# Patient Record
Sex: Male | Born: 1950 | Race: Black or African American | Hispanic: No | Marital: Married | Smoking: Former smoker
Health system: Southern US, Community
[De-identification: ages and names within clinical notes are randomized; demographics above are authoritative.]

## PROBLEM LIST (undated history)

## (undated) DIAGNOSIS — Z923 Personal history of irradiation: Secondary | ICD-10-CM

## (undated) DIAGNOSIS — I10 Essential (primary) hypertension: Secondary | ICD-10-CM

## (undated) DIAGNOSIS — Z5189 Encounter for other specified aftercare: Secondary | ICD-10-CM

## (undated) DIAGNOSIS — C61 Malignant neoplasm of prostate: Secondary | ICD-10-CM

## (undated) DIAGNOSIS — T7840XA Allergy, unspecified, initial encounter: Secondary | ICD-10-CM

## (undated) DIAGNOSIS — C419 Malignant neoplasm of bone and articular cartilage, unspecified: Secondary | ICD-10-CM

## (undated) DIAGNOSIS — I2510 Atherosclerotic heart disease of native coronary artery without angina pectoris: Secondary | ICD-10-CM

## (undated) DIAGNOSIS — D649 Anemia, unspecified: Secondary | ICD-10-CM

## (undated) HISTORY — PX: PORTACATH PLACEMENT: SHX2246

## (undated) HISTORY — DX: Malignant neoplasm of bone and articular cartilage, unspecified: C41.9

## (undated) SURGERY — LUMBAR LAMINECTOMY FOR TUMOR
Anesthesia: General | Laterality: Bilateral

---

## 2001-03-01 ENCOUNTER — Encounter (INDEPENDENT_AMBULATORY_CARE_PROVIDER_SITE_OTHER): Payer: Self-pay | Admitting: Specialist

## 2001-03-01 ENCOUNTER — Other Ambulatory Visit: Admission: RE | Admit: 2001-03-01 | Discharge: 2001-03-01 | Payer: Self-pay | Admitting: Internal Medicine

## 2001-03-01 HISTORY — PX: COLONOSCOPY W/ POLYPECTOMY: SHX1380

## 2004-05-13 ENCOUNTER — Encounter: Payer: Self-pay | Admitting: Internal Medicine

## 2004-07-02 ENCOUNTER — Ambulatory Visit: Payer: Self-pay | Admitting: Family Medicine

## 2004-07-02 ENCOUNTER — Encounter: Admission: RE | Admit: 2004-07-02 | Discharge: 2004-07-02 | Payer: Self-pay | Admitting: Family Medicine

## 2004-07-14 ENCOUNTER — Ambulatory Visit: Payer: Self-pay | Admitting: Family Medicine

## 2005-11-18 ENCOUNTER — Ambulatory Visit: Payer: Self-pay | Admitting: Family Medicine

## 2005-12-10 ENCOUNTER — Ambulatory Visit: Payer: Self-pay | Admitting: Family Medicine

## 2006-01-06 ENCOUNTER — Ambulatory Visit (HOSPITAL_COMMUNITY): Admission: RE | Admit: 2006-01-06 | Discharge: 2006-01-06 | Payer: Self-pay | Admitting: Urology

## 2006-01-08 DIAGNOSIS — C61 Malignant neoplasm of prostate: Secondary | ICD-10-CM

## 2006-01-08 HISTORY — DX: Malignant neoplasm of prostate: C61

## 2006-01-15 ENCOUNTER — Encounter (HOSPITAL_COMMUNITY): Admission: RE | Admit: 2006-01-15 | Discharge: 2006-04-15 | Payer: Self-pay | Admitting: Urology

## 2006-03-03 ENCOUNTER — Encounter (INDEPENDENT_AMBULATORY_CARE_PROVIDER_SITE_OTHER): Payer: Self-pay | Admitting: Specialist

## 2006-03-03 ENCOUNTER — Inpatient Hospital Stay (HOSPITAL_COMMUNITY): Admission: RE | Admit: 2006-03-03 | Discharge: 2006-03-04 | Payer: Self-pay | Admitting: Urology

## 2006-03-03 HISTORY — PX: PROSTATECTOMY: SHX69

## 2006-04-09 ENCOUNTER — Ambulatory Visit: Admission: RE | Admit: 2006-04-09 | Discharge: 2006-07-08 | Payer: Self-pay | Admitting: Radiation Oncology

## 2006-04-21 ENCOUNTER — Ambulatory Visit: Payer: Self-pay | Admitting: Oncology

## 2006-04-30 LAB — COMPREHENSIVE METABOLIC PANEL
ALT: 21 U/L (ref 0–40)
AST: 15 U/L (ref 0–37)
Chloride: 106 mEq/L (ref 96–112)
Creatinine, Ser: 0.96 mg/dL (ref 0.40–1.50)
Total Bilirubin: 0.4 mg/dL (ref 0.3–1.2)

## 2006-04-30 LAB — CBC WITH DIFFERENTIAL/PLATELET
Eosinophils Absolute: 0.1 10*3/uL (ref 0.0–0.5)
HGB: 10.3 g/dL — ABNORMAL LOW (ref 13.0–17.1)
MONO#: 0.4 10*3/uL (ref 0.1–0.9)
NEUT#: 1.9 10*3/uL (ref 1.5–6.5)
RBC: 4.26 10*6/uL (ref 4.20–5.71)
RDW: 15.8 % — ABNORMAL HIGH (ref 11.2–14.6)
WBC: 3.8 10*3/uL — ABNORMAL LOW (ref 4.0–10.0)
lymph#: 1.4 10*3/uL (ref 0.9–3.3)

## 2007-04-05 ENCOUNTER — Ambulatory Visit: Payer: Self-pay | Admitting: Family Medicine

## 2007-04-05 LAB — CONVERTED CEMR LAB
ALT: 72 units/L — ABNORMAL HIGH (ref 0–53)
AST: 35 units/L (ref 0–37)
Albumin: 3.8 g/dL (ref 3.5–5.2)
Alkaline Phosphatase: 61 units/L (ref 39–117)
BUN: 18 mg/dL (ref 6–23)
Basophils Absolute: 0 10*3/uL (ref 0.0–0.1)
Basophils Relative: 1 % (ref 0.0–1.0)
Bilirubin Urine: NEGATIVE
Bilirubin, Direct: 0.1 mg/dL (ref 0.0–0.3)
Blood in Urine, dipstick: NEGATIVE
CO2: 28 meq/L (ref 19–32)
Calcium: 9.9 mg/dL (ref 8.4–10.5)
Chloride: 105 meq/L (ref 96–112)
Cholesterol: 174 mg/dL (ref 0–200)
Creatinine, Ser: 0.9 mg/dL (ref 0.4–1.5)
Eosinophils Absolute: 0.2 10*3/uL (ref 0.0–0.6)
Eosinophils Relative: 3.8 % (ref 0.0–5.0)
GFR calc Af Amer: 112 mL/min
GFR calc non Af Amer: 92 mL/min
Glucose, Bld: 136 mg/dL — ABNORMAL HIGH (ref 70–99)
Glucose, Urine, Semiquant: NEGATIVE
HCT: 32 % — ABNORMAL LOW (ref 39.0–52.0)
HDL: 34.5 mg/dL — ABNORMAL LOW (ref >39.0)
Hemoglobin: 10.5 g/dL — ABNORMAL LOW (ref 13.0–17.0)
Ketones, urine, test strip: NEGATIVE
LDL Cholesterol: 119 mg/dL — ABNORMAL HIGH (ref 0–99)
Lymphocytes Relative: 32.1 % (ref 12.0–46.0)
MCHC: 32.8 g/dL (ref 30.0–36.0)
MCV: 75.4 fL — ABNORMAL LOW (ref 78.0–100.0)
Monocytes Absolute: 0.3 10*3/uL (ref 0.2–0.7)
Monocytes Relative: 7.6 % (ref 3.0–11.0)
Neutro Abs: 2.2 10*3/uL (ref 1.4–7.7)
Neutrophils Relative %: 55.5 % (ref 43.0–77.0)
Nitrite: NEGATIVE
PSA: 0.04 ng/mL — ABNORMAL LOW (ref 0.10–4.00)
Platelets: 221 10*3/uL (ref 150–400)
Potassium: 3.8 meq/L (ref 3.5–5.1)
Protein, U semiquant: NEGATIVE
RBC: 4.24 M/uL (ref 4.22–5.81)
RDW: 14.4 % (ref 11.5–14.6)
Sodium: 140 meq/L (ref 135–145)
Specific Gravity, Urine: 1.02
TSH: 1.36 microintl units/mL (ref 0.35–5.50)
Total Bilirubin: 0.7 mg/dL (ref 0.3–1.2)
Total CHOL/HDL Ratio: 5
Total Protein: 7 g/dL (ref 6.0–8.3)
Triglycerides: 101 mg/dL (ref 0–149)
Urobilinogen, UA: 0.2
VLDL: 20 mg/dL (ref 0–40)
WBC Urine, dipstick: NEGATIVE
WBC: 4 10*3/uL — ABNORMAL LOW (ref 4.5–10.5)
pH: 5

## 2007-04-06 ENCOUNTER — Telehealth: Payer: Self-pay | Admitting: Family Medicine

## 2007-04-12 ENCOUNTER — Ambulatory Visit: Payer: Self-pay | Admitting: Family Medicine

## 2007-04-12 DIAGNOSIS — F17200 Nicotine dependence, unspecified, uncomplicated: Secondary | ICD-10-CM

## 2007-04-12 DIAGNOSIS — I10 Essential (primary) hypertension: Secondary | ICD-10-CM | POA: Insufficient documentation

## 2007-04-12 DIAGNOSIS — D649 Anemia, unspecified: Secondary | ICD-10-CM

## 2007-04-12 DIAGNOSIS — Z8546 Personal history of malignant neoplasm of prostate: Secondary | ICD-10-CM

## 2007-05-24 ENCOUNTER — Encounter: Payer: Self-pay | Admitting: Family Medicine

## 2007-10-11 ENCOUNTER — Ambulatory Visit: Payer: Self-pay | Admitting: Family Medicine

## 2007-10-11 DIAGNOSIS — E119 Type 2 diabetes mellitus without complications: Secondary | ICD-10-CM

## 2007-10-11 LAB — CONVERTED CEMR LAB
ALT: 48 units/L (ref 0–53)
AST: 31 units/L (ref 0–37)
Albumin: 4.5 g/dL (ref 3.5–5.2)
Alkaline Phosphatase: 97 units/L (ref 39–117)
BUN: 15 mg/dL (ref 6–23)
Basophils Absolute: 0.1 10*3/uL (ref 0.0–0.1)
Basophils Relative: 1.2 % — ABNORMAL HIGH (ref 0.0–1.0)
Bilirubin, Direct: 0.2 mg/dL (ref 0.0–0.3)
CO2: 28 meq/L (ref 19–32)
Calcium: 10.7 mg/dL — ABNORMAL HIGH (ref 8.4–10.5)
Chloride: 100 meq/L (ref 96–112)
Creatinine, Ser: 1 mg/dL (ref 0.4–1.5)
Creatinine,U: 82.9 mg/dL
Eosinophils Absolute: 0.1 10*3/uL (ref 0.0–0.6)
Eosinophils Relative: 2.4 % (ref 0.0–5.0)
GFR calc Af Amer: 99 mL/min
GFR calc non Af Amer: 82 mL/min
Glucose, Bld: 361 mg/dL — ABNORMAL HIGH (ref 70–99)
HCT: 42.2 % (ref 39.0–52.0)
Hemoglobin: 13.2 g/dL (ref 13.0–17.0)
Hgb A1c MFr Bld: 13.2 % — ABNORMAL HIGH (ref 4.6–6.0)
Lymphocytes Relative: 30.4 % (ref 12.0–46.0)
MCHC: 31.4 g/dL (ref 30.0–36.0)
MCV: 75.3 fL — ABNORMAL LOW (ref 78.0–100.0)
Microalb Creat Ratio: 12.1 mg/g (ref 0.0–30.0)
Microalb, Ur: 1 mg/dL (ref 0.0–1.9)
Monocytes Absolute: 0.3 10*3/uL (ref 0.2–0.7)
Monocytes Relative: 6 % (ref 3.0–11.0)
Neutro Abs: 2.9 10*3/uL (ref 1.4–7.7)
Neutrophils Relative %: 60 % (ref 43.0–77.0)
Platelets: 201 10*3/uL (ref 150–400)
Potassium: 5.1 meq/L (ref 3.5–5.1)
RBC: 5.61 M/uL (ref 4.22–5.81)
RDW: 13.1 % (ref 11.5–14.6)
Sodium: 138 meq/L (ref 135–145)
Total Bilirubin: 1.1 mg/dL (ref 0.3–1.2)
Total Protein: 7.3 g/dL (ref 6.0–8.3)
WBC: 4.9 10*3/uL (ref 4.5–10.5)

## 2007-10-14 ENCOUNTER — Ambulatory Visit: Payer: Self-pay | Admitting: Family Medicine

## 2007-10-14 DIAGNOSIS — E109 Type 1 diabetes mellitus without complications: Secondary | ICD-10-CM | POA: Insufficient documentation

## 2007-10-18 ENCOUNTER — Ambulatory Visit: Payer: Self-pay | Admitting: Family Medicine

## 2007-11-08 ENCOUNTER — Ambulatory Visit: Payer: Self-pay | Admitting: Family Medicine

## 2008-01-03 ENCOUNTER — Ambulatory Visit: Payer: Self-pay | Admitting: Family Medicine

## 2008-01-03 LAB — CONVERTED CEMR LAB
BUN: 18 mg/dL (ref 6–23)
CO2: 30 meq/L (ref 19–32)
Calcium: 10.2 mg/dL (ref 8.4–10.5)
Chloride: 107 meq/L (ref 96–112)
Creatinine, Ser: 0.7 mg/dL (ref 0.4–1.5)
GFR calc Af Amer: 149 mL/min
GFR calc non Af Amer: 123 mL/min
Glucose, Bld: 93 mg/dL (ref 70–99)
Hgb A1c MFr Bld: 5.8 % (ref 4.6–6.0)
Potassium: 4.4 meq/L (ref 3.5–5.1)
Sodium: 143 meq/L (ref 135–145)

## 2008-01-09 ENCOUNTER — Ambulatory Visit: Payer: Self-pay | Admitting: Family Medicine

## 2008-04-06 ENCOUNTER — Ambulatory Visit: Payer: Self-pay | Admitting: Family Medicine

## 2008-04-09 LAB — CONVERTED CEMR LAB
BUN: 22 mg/dL (ref 6–23)
CO2: 28 meq/L (ref 19–32)
Calcium: 9.9 mg/dL (ref 8.4–10.5)
Chloride: 102 meq/L (ref 96–112)
Creatinine, Ser: 0.7 mg/dL (ref 0.4–1.5)
GFR calc Af Amer: 149 mL/min
GFR calc non Af Amer: 123 mL/min
Glucose, Bld: 102 mg/dL — ABNORMAL HIGH (ref 70–99)
Hgb A1c MFr Bld: 5.2 % (ref 4.6–6.0)
Potassium: 4 meq/L (ref 3.5–5.1)
Sodium: 138 meq/L (ref 135–145)

## 2008-04-12 ENCOUNTER — Encounter: Payer: Self-pay | Admitting: Family Medicine

## 2008-04-13 ENCOUNTER — Ambulatory Visit: Payer: Self-pay | Admitting: Family Medicine

## 2008-04-13 LAB — CONVERTED CEMR LAB
ALT: 45 units/L (ref 0–53)
AST: 28 units/L (ref 0–37)
Albumin: 4.3 g/dL (ref 3.5–5.2)
Alkaline Phosphatase: 62 units/L (ref 39–117)
Basophils Absolute: 0.1 10*3/uL (ref 0.0–0.1)
Basophils Relative: 1.2 % (ref 0.0–3.0)
Bilirubin Urine: NEGATIVE
Bilirubin, Direct: 0.1 mg/dL (ref 0.0–0.3)
Blood in Urine, dipstick: NEGATIVE
Cholesterol: 155 mg/dL (ref 0–200)
Eosinophils Absolute: 0.1 10*3/uL (ref 0.0–0.7)
Eosinophils Relative: 3.3 % (ref 0.0–5.0)
Glucose, Urine, Semiquant: NEGATIVE
HCT: 31.9 % — ABNORMAL LOW (ref 39.0–52.0)
HDL: 33.2 mg/dL — ABNORMAL LOW (ref >39.0)
Hemoglobin: 10.7 g/dL — ABNORMAL LOW (ref 13.0–17.0)
Ketones, urine, test strip: NEGATIVE
LDL Cholesterol: 112 mg/dL — ABNORMAL HIGH (ref 0–99)
Lymphocytes Relative: 30.5 % (ref 12.0–46.0)
MCHC: 33.6 g/dL (ref 30.0–36.0)
MCV: 78.8 fL (ref 78.0–100.0)
Monocytes Absolute: 0.3 10*3/uL (ref 0.1–1.0)
Monocytes Relative: 6.6 % (ref 3.0–12.0)
Neutro Abs: 2.6 10*3/uL (ref 1.4–7.7)
Neutrophils Relative %: 58.4 % (ref 43.0–77.0)
Nitrite: NEGATIVE
PSA: 3.65 ng/mL (ref 0.10–4.00)
Platelets: 241 10*3/uL (ref 150–400)
Protein, U semiquant: NEGATIVE
RBC: 4.05 M/uL — ABNORMAL LOW (ref 4.22–5.81)
RDW: 13.9 % (ref 11.5–14.6)
Specific Gravity, Urine: 1.025
TSH: 0.56 microintl units/mL (ref 0.35–5.50)
Total Bilirubin: 0.5 mg/dL (ref 0.3–1.2)
Total CHOL/HDL Ratio: 4.7
Total Protein: 7.4 g/dL (ref 6.0–8.3)
Triglycerides: 51 mg/dL (ref 0–149)
Urobilinogen, UA: 0.2
VLDL: 10 mg/dL (ref 0–40)
WBC Urine, dipstick: NEGATIVE
WBC: 4.4 10*3/uL — ABNORMAL LOW (ref 4.5–10.5)
pH: 5.5

## 2008-04-18 ENCOUNTER — Encounter: Payer: Self-pay | Admitting: Family Medicine

## 2008-04-30 ENCOUNTER — Telehealth: Payer: Self-pay | Admitting: Family Medicine

## 2008-05-04 ENCOUNTER — Ambulatory Visit (HOSPITAL_COMMUNITY): Admission: RE | Admit: 2008-05-04 | Discharge: 2008-05-04 | Payer: Self-pay | Admitting: Urology

## 2008-05-14 ENCOUNTER — Ambulatory Visit: Payer: Self-pay | Admitting: Family Medicine

## 2008-05-14 LAB — CONVERTED CEMR LAB
Basophils Absolute: 0 10*3/uL (ref 0.0–0.1)
Basophils Relative: 0.8 % (ref 0.0–3.0)
Eosinophils Absolute: 0.1 10*3/uL (ref 0.0–0.7)
Eosinophils Relative: 2.7 % (ref 0.0–5.0)
Folate: 12.9 ng/mL
HCT: 30.9 % — ABNORMAL LOW (ref 39.0–52.0)
Hemoglobin: 9.9 g/dL — ABNORMAL LOW (ref 13.0–17.0)
Iron: 78 ug/dL (ref 42–165)
Lymphocytes Relative: 31.4 % (ref 12.0–46.0)
MCHC: 32.2 g/dL (ref 30.0–36.0)
MCV: 78.7 fL (ref 78.0–100.0)
Monocytes Absolute: 0.4 10*3/uL (ref 0.1–1.0)
Monocytes Relative: 7.8 % (ref 3.0–12.0)
Neutro Abs: 3.1 10*3/uL (ref 1.4–7.7)
Neutrophils Relative %: 57.3 % (ref 43.0–77.0)
Platelets: 221 10*3/uL (ref 150–400)
RBC: 3.92 M/uL — ABNORMAL LOW (ref 4.22–5.81)
RDW: 14 % (ref 11.5–14.6)
Retic Ct Pct: 1.5 % (ref 0.4–3.1)
Saturation Ratios: 23.6 % (ref 20.0–50.0)
Transferrin: 235.7 mg/dL (ref >=212.0)
Vitamin B-12: 289 pg/mL (ref 211–911)
WBC: 5.3 10*3/uL (ref 4.5–10.5)

## 2008-05-17 ENCOUNTER — Ambulatory Visit: Payer: Self-pay | Admitting: Oncology

## 2008-05-17 LAB — CBC WITH DIFFERENTIAL/PLATELET
Eosinophils Absolute: 0.1 10*3/uL (ref 0.0–0.5)
MONO#: 0.2 10*3/uL (ref 0.1–0.9)
MONO%: 5.1 % (ref 0.0–13.0)
NEUT#: 2.5 10*3/uL (ref 1.5–6.5)
RBC: 4.12 10*6/uL — ABNORMAL LOW (ref 4.20–5.71)
RDW: 14.8 % — ABNORMAL HIGH (ref 11.2–14.6)
WBC: 4.7 10*3/uL (ref 4.0–10.0)
lymph#: 1.7 10*3/uL (ref 0.9–3.3)

## 2008-05-17 LAB — CHCC SMEAR

## 2008-05-21 LAB — COMPREHENSIVE METABOLIC PANEL
ALT: 36 U/L (ref 0–53)
CO2: 25 mEq/L (ref 19–32)
Chloride: 105 mEq/L (ref 96–112)
Sodium: 142 mEq/L (ref 135–145)
Total Bilirubin: 0.6 mg/dL (ref 0.3–1.2)
Total Protein: 6.9 g/dL (ref 6.0–8.3)

## 2008-05-21 LAB — IRON AND TIBC
%SAT: 32 % (ref 20–55)
TIBC: 305 ug/dL (ref 215–435)

## 2008-05-21 LAB — IMMUNOFIXATION ELECTROPHORESIS
IgG (Immunoglobin G), Serum: 1160 mg/dL (ref 694–1618)
IgM, Serum: 47 mg/dL — ABNORMAL LOW (ref 60–263)
Total Protein, Serum Electrophoresis: 6.9 g/dL (ref 6.0–8.3)

## 2008-05-21 LAB — LACTATE DEHYDROGENASE: LDH: 137 U/L (ref 94–250)

## 2008-05-21 LAB — KAPPA/LAMBDA LIGHT CHAINS: Kappa free light chain: 1.08 mg/dL (ref 0.33–1.94)

## 2008-05-22 ENCOUNTER — Telehealth: Payer: Self-pay | Admitting: Family Medicine

## 2008-05-25 DIAGNOSIS — D126 Benign neoplasm of colon, unspecified: Secondary | ICD-10-CM | POA: Insufficient documentation

## 2008-05-28 ENCOUNTER — Ambulatory Visit: Payer: Self-pay | Admitting: Internal Medicine

## 2008-05-28 DIAGNOSIS — Z860100 Personal history of colon polyps, unspecified: Secondary | ICD-10-CM | POA: Insufficient documentation

## 2008-05-28 DIAGNOSIS — D509 Iron deficiency anemia, unspecified: Secondary | ICD-10-CM

## 2008-06-07 ENCOUNTER — Encounter: Payer: Self-pay | Admitting: Internal Medicine

## 2008-06-07 ENCOUNTER — Ambulatory Visit: Payer: Self-pay | Admitting: Internal Medicine

## 2008-06-11 ENCOUNTER — Encounter: Payer: Self-pay | Admitting: Internal Medicine

## 2008-07-24 ENCOUNTER — Ambulatory Visit: Payer: Self-pay | Admitting: Oncology

## 2008-07-26 LAB — CBC WITH DIFFERENTIAL/PLATELET
Eosinophils Absolute: 0.1 10*3/uL (ref 0.0–0.5)
MCV: 79.9 fL — ABNORMAL LOW (ref 81.6–98.0)
MONO%: 8.7 % (ref 0.0–13.0)
NEUT#: 3.2 10*3/uL (ref 1.5–6.5)
RBC: 4.1 10*6/uL — ABNORMAL LOW (ref 4.20–5.71)
RDW: 14.7 % — ABNORMAL HIGH (ref 11.2–14.6)
WBC: 5.2 10*3/uL (ref 4.0–10.0)
lymph#: 1.4 10*3/uL (ref 0.9–3.3)

## 2008-07-27 LAB — COMPREHENSIVE METABOLIC PANEL
AST: 18 U/L (ref 0–37)
Albumin: 4.7 g/dL (ref 3.5–5.2)
Alkaline Phosphatase: 70 U/L (ref 39–117)
Chloride: 104 mEq/L (ref 96–112)
Glucose, Bld: 110 mg/dL — ABNORMAL HIGH (ref 70–99)
Potassium: 4.3 mEq/L (ref 3.5–5.3)
Sodium: 139 mEq/L (ref 135–145)
Total Protein: 7.2 g/dL (ref 6.0–8.3)

## 2008-07-27 LAB — PSA: PSA: 7.46 ng/mL — ABNORMAL HIGH (ref 0.10–4.00)

## 2008-10-08 ENCOUNTER — Ambulatory Visit: Payer: Self-pay | Admitting: Oncology

## 2008-10-09 ENCOUNTER — Ambulatory Visit: Payer: Self-pay | Admitting: Family Medicine

## 2008-10-10 ENCOUNTER — Telehealth: Payer: Self-pay | Admitting: Family Medicine

## 2008-10-10 LAB — CONVERTED CEMR LAB
BUN: 14 mg/dL (ref 6–23)
Basophils Absolute: 0 10*3/uL (ref 0.0–0.1)
Basophils Relative: 0.4 % (ref 0.0–3.0)
CO2: 29 meq/L (ref 19–32)
Calcium: 10.2 mg/dL (ref 8.4–10.5)
Chloride: 103 meq/L (ref 96–112)
Creatinine, Ser: 0.7 mg/dL (ref 0.4–1.5)
Creatinine,U: 374.8 mg/dL
Eosinophils Absolute: 0.1 10*3/uL (ref 0.0–0.7)
Eosinophils Relative: 2.6 % (ref 0.0–5.0)
Folate: >20 ng/mL
GFR calc Af Amer: 148 mL/min
GFR calc non Af Amer: 123 mL/min
Glucose, Bld: 118 mg/dL — ABNORMAL HIGH (ref 70–99)
HCT: 34.5 % — ABNORMAL LOW (ref 39.0–52.0)
Hemoglobin: 11 g/dL — ABNORMAL LOW (ref 13.0–17.0)
Hgb A1c MFr Bld: 6.1 % — ABNORMAL HIGH (ref 4.6–6.0)
Iron: 97 ug/dL (ref 42–165)
Lymphocytes Relative: 26.9 % (ref 12.0–46.0)
MCHC: 31.9 g/dL (ref 30.0–36.0)
MCV: 78.6 fL (ref 78.0–100.0)
Microalb Creat Ratio: 6.7 mg/g (ref 0.0–30.0)
Microalb, Ur: 2.5 mg/dL — ABNORMAL HIGH (ref 0.0–1.9)
Monocytes Absolute: 0.4 10*3/uL (ref 0.1–1.0)
Monocytes Relative: 7.5 % (ref 3.0–12.0)
Neutro Abs: 3 10*3/uL (ref 1.4–7.7)
Neutrophils Relative %: 62.6 % (ref 43.0–77.0)
Platelets: 223 10*3/uL (ref 150–400)
Potassium: 3.8 meq/L (ref 3.5–5.1)
RBC: 4.39 M/uL (ref 4.22–5.81)
RDW: 13.8 % (ref 11.5–14.6)
Saturation Ratios: 28.9 % (ref 20.0–50.0)
Sodium: 141 meq/L (ref 135–145)
Transferrin: 240 mg/dL (ref 212.0–360.0)
Vitamin B-12: 447 pg/mL (ref 211–911)
WBC: 4.8 10*3/uL (ref 4.5–10.5)

## 2008-10-10 LAB — COMPREHENSIVE METABOLIC PANEL
AST: 25 U/L (ref 0–37)
Albumin: 4.8 g/dL (ref 3.5–5.2)
BUN: 19 mg/dL (ref 6–23)
CO2: 25 mEq/L (ref 19–32)
Calcium: 10.2 mg/dL (ref 8.4–10.5)
Chloride: 105 mEq/L (ref 96–112)
Glucose, Bld: 130 mg/dL — ABNORMAL HIGH (ref 70–99)
Potassium: 4.3 mEq/L (ref 3.5–5.3)

## 2008-10-10 LAB — CBC WITH DIFFERENTIAL/PLATELET
Basophils Absolute: 0 10*3/uL (ref 0.0–0.1)
Eosinophils Absolute: 0.1 10*3/uL (ref 0.0–0.5)
HCT: 32.7 % — ABNORMAL LOW (ref 38.4–49.9)
HGB: 10.5 g/dL — ABNORMAL LOW (ref 13.0–17.1)
MONO#: 0.3 10*3/uL (ref 0.1–0.9)
NEUT#: 3 10*3/uL (ref 1.5–6.5)
NEUT%: 63.8 % (ref 39.0–75.0)
RDW: 15 % — ABNORMAL HIGH (ref 11.0–14.6)
lymph#: 1.2 10*3/uL (ref 0.9–3.3)

## 2008-10-10 LAB — PSA: PSA: 30.05 ng/mL — ABNORMAL HIGH (ref 0.10–4.00)

## 2008-12-07 ENCOUNTER — Ambulatory Visit: Payer: Self-pay | Admitting: Oncology

## 2009-01-09 ENCOUNTER — Ambulatory Visit (HOSPITAL_COMMUNITY): Admission: RE | Admit: 2009-01-09 | Discharge: 2009-01-09 | Payer: Self-pay | Admitting: Oncology

## 2009-01-24 ENCOUNTER — Ambulatory Visit: Payer: Self-pay | Admitting: Oncology

## 2009-01-28 LAB — CBC WITH DIFFERENTIAL/PLATELET
Eosinophils Absolute: 0.2 10*3/uL (ref 0.0–0.5)
MCV: 77.6 fL — ABNORMAL LOW (ref 79.3–98.0)
MONO%: 6.6 % (ref 0.0–14.0)
NEUT#: 2.5 10*3/uL (ref 1.5–6.5)
RBC: 4.11 10*6/uL — ABNORMAL LOW (ref 4.20–5.82)
RDW: 14.8 % — ABNORMAL HIGH (ref 11.0–14.6)
WBC: 4.4 10*3/uL (ref 4.0–10.3)
lymph#: 1.4 10*3/uL (ref 0.9–3.3)
nRBC: 0 % (ref 0–0)

## 2009-01-30 LAB — IRON AND TIBC
%SAT: 25 % (ref 20–55)
TIBC: 295 ug/dL (ref 215–435)

## 2009-01-30 LAB — SPEP & IFE WITH QIG
Albumin ELP: 62.6 % (ref 55.8–66.1)
Alpha-1-Globulin: 3.6 % (ref 2.9–4.9)
Beta Globulin: 5.4 % (ref 4.7–7.2)
IgA: 209 mg/dL (ref 68–378)
IgM, Serum: 51 mg/dL — ABNORMAL LOW (ref 60–263)
Total Protein, Serum Electrophoresis: 7 g/dL (ref 6.0–8.3)

## 2009-01-30 LAB — HEMOGLOBINOPATHY EVALUATION
Hgb A: 96.5 % — ABNORMAL LOW (ref 96.8–97.8)
Hgb F Quant: 0.7 % (ref 0.0–2.0)
Hgb S Quant: 0 % (ref 0.0–0.0)

## 2009-01-30 LAB — COMPREHENSIVE METABOLIC PANEL
AST: 21 U/L (ref 0–37)
Albumin: 4.2 g/dL (ref 3.5–5.2)
Alkaline Phosphatase: 74 U/L (ref 39–117)
Glucose, Bld: 167 mg/dL — ABNORMAL HIGH (ref 70–99)
Potassium: 4.1 mEq/L (ref 3.5–5.3)
Sodium: 138 mEq/L (ref 135–145)
Total Bilirubin: 0.3 mg/dL (ref 0.3–1.2)
Total Protein: 7 g/dL (ref 6.0–8.3)

## 2009-01-30 LAB — FERRITIN: Ferritin: 748 ng/mL — ABNORMAL HIGH (ref 22–322)

## 2009-03-04 ENCOUNTER — Ambulatory Visit: Payer: Self-pay | Admitting: Oncology

## 2009-03-11 LAB — CBC WITH DIFFERENTIAL/PLATELET
BASO%: 0.3 % (ref 0.0–2.0)
Basophils Absolute: 0 10e3/uL (ref 0.0–0.1)
EOS%: 4.5 % (ref 0.0–7.0)
Eosinophils Absolute: 0.2 10e3/uL (ref 0.0–0.5)
HCT: 33.4 % — ABNORMAL LOW (ref 38.4–49.9)
HGB: 10.6 g/dL — ABNORMAL LOW (ref 13.0–17.1)
LYMPH%: 35.1 % (ref 14.0–49.0)
MCH: 25.1 pg — ABNORMAL LOW (ref 27.2–33.4)
MCHC: 31.6 g/dL — ABNORMAL LOW (ref 32.0–36.0)
MCV: 79.4 fL (ref 79.3–98.0)
MONO#: 0.3 10e3/uL (ref 0.1–0.9)
MONO%: 7.2 % (ref 0.0–14.0)
NEUT#: 2.3 10e3/uL (ref 1.5–6.5)
NEUT%: 52.9 % (ref 39.0–75.0)
Platelets: 240 10e3/uL (ref 140–400)
RBC: 4.21 10e6/uL (ref 4.20–5.82)
RDW: 15.3 % — ABNORMAL HIGH (ref 11.0–14.6)
WBC: 4.4 10e3/uL (ref 4.0–10.3)
lymph#: 1.5 10e3/uL (ref 0.9–3.3)

## 2009-03-11 LAB — COMPREHENSIVE METABOLIC PANEL WITH GFR
ALT: 38 U/L (ref 0–53)
AST: 18 U/L (ref 0–37)
Albumin: 4.3 g/dL (ref 3.5–5.2)
Alkaline Phosphatase: 73 U/L (ref 39–117)
BUN: 16 mg/dL (ref 6–23)
CO2: 25 meq/L (ref 19–32)
Calcium: 9.9 mg/dL (ref 8.4–10.5)
Chloride: 104 meq/L (ref 96–112)
Creatinine, Ser: 0.91 mg/dL (ref 0.40–1.50)
Glucose, Bld: 182 mg/dL — ABNORMAL HIGH (ref 70–99)
Potassium: 4.5 meq/L (ref 3.5–5.3)
Sodium: 140 meq/L (ref 135–145)
Total Bilirubin: 0.4 mg/dL (ref 0.3–1.2)
Total Protein: 6.9 g/dL (ref 6.0–8.3)

## 2009-03-11 LAB — TESTOSTERONE: Testosterone: 26.51 ng/dL — ABNORMAL LOW (ref 350–890)

## 2009-03-11 LAB — PSA: PSA: 82.34 ng/mL — ABNORMAL HIGH (ref 0.10–4.00)

## 2009-04-04 ENCOUNTER — Ambulatory Visit: Payer: Self-pay | Admitting: Oncology

## 2009-04-08 LAB — CBC WITH DIFFERENTIAL/PLATELET
Basophils Absolute: 0 10*3/uL (ref 0.0–0.1)
HCT: 31.9 % — ABNORMAL LOW (ref 38.4–49.9)
HGB: 10.1 g/dL — ABNORMAL LOW (ref 13.0–17.1)
LYMPH%: 35 % (ref 14.0–49.0)
MONO#: 0.3 10*3/uL (ref 0.1–0.9)
NEUT%: 55.3 % (ref 39.0–75.0)
Platelets: 220 10*3/uL (ref 140–400)
WBC: 4.5 10*3/uL (ref 4.0–10.3)
lymph#: 1.6 10*3/uL (ref 0.9–3.3)

## 2009-04-08 LAB — PSA: PSA: 96.72 ng/mL — ABNORMAL HIGH (ref 0.10–4.00)

## 2009-04-08 LAB — TESTOSTERONE: Testosterone: 44.95 ng/dL — ABNORMAL LOW (ref 350–890)

## 2009-04-08 LAB — COMPREHENSIVE METABOLIC PANEL
ALT: 36 U/L (ref 0–53)
BUN: 23 mg/dL (ref 6–23)
CO2: 23 mEq/L (ref 19–32)
Calcium: 9.7 mg/dL (ref 8.4–10.5)
Chloride: 104 mEq/L (ref 96–112)
Creatinine, Ser: 0.85 mg/dL (ref 0.40–1.50)
Glucose, Bld: 128 mg/dL — ABNORMAL HIGH (ref 70–99)

## 2009-05-02 ENCOUNTER — Encounter (INDEPENDENT_AMBULATORY_CARE_PROVIDER_SITE_OTHER): Payer: Self-pay | Admitting: *Deleted

## 2009-05-07 ENCOUNTER — Telehealth: Payer: Self-pay | Admitting: Internal Medicine

## 2009-05-09 ENCOUNTER — Ambulatory Visit: Payer: Self-pay | Admitting: Oncology

## 2009-05-13 LAB — COMPREHENSIVE METABOLIC PANEL
AST: 16 U/L (ref 0–37)
Albumin: 4.7 g/dL (ref 3.5–5.2)
BUN: 21 mg/dL (ref 6–23)
CO2: 25 mEq/L (ref 19–32)
Calcium: 10.2 mg/dL (ref 8.4–10.5)
Chloride: 104 mEq/L (ref 96–112)
Potassium: 4.2 mEq/L (ref 3.5–5.3)

## 2009-05-13 LAB — PSA: PSA: 99.99 ng/mL — ABNORMAL HIGH (ref 0.10–4.00)

## 2009-05-13 LAB — CBC WITH DIFFERENTIAL/PLATELET
Basophils Absolute: 0 10*3/uL (ref 0.0–0.1)
Eosinophils Absolute: 0.2 10*3/uL (ref 0.0–0.5)
HCT: 32.5 % — ABNORMAL LOW (ref 38.4–49.9)
HGB: 10.3 g/dL — ABNORMAL LOW (ref 13.0–17.1)
MCH: 25.4 pg — ABNORMAL LOW (ref 27.2–33.4)
MCV: 79.9 fL (ref 79.3–98.0)
MONO%: 7.8 % (ref 0.0–14.0)
NEUT#: 2.9 10*3/uL (ref 1.5–6.5)
NEUT%: 59.2 % (ref 39.0–75.0)
RDW: 15.3 % — ABNORMAL HIGH (ref 11.0–14.6)

## 2009-06-10 ENCOUNTER — Ambulatory Visit: Payer: Self-pay | Admitting: Oncology

## 2009-06-10 LAB — CBC WITH DIFFERENTIAL/PLATELET
BASO%: 0.7 % (ref 0.0–2.0)
Basophils Absolute: 0 10*3/uL (ref 0.0–0.1)
EOS%: 1.1 % (ref 0.0–7.0)
HCT: 34 % — ABNORMAL LOW (ref 38.4–49.9)
HGB: 10.3 g/dL — ABNORMAL LOW (ref 13.0–17.1)
LYMPH%: 28.1 % (ref 14.0–49.0)
MCH: 24.2 pg — ABNORMAL LOW (ref 27.2–33.4)
MCHC: 30.3 g/dL — ABNORMAL LOW (ref 32.0–36.0)
MCV: 80 fL (ref 79.3–98.0)
MONO%: 6.2 % (ref 0.0–14.0)
NEUT%: 63.9 % (ref 39.0–75.0)

## 2009-06-10 LAB — COMPREHENSIVE METABOLIC PANEL
ALT: 39 U/L (ref 0–53)
AST: 20 U/L (ref 0–37)
Alkaline Phosphatase: 59 U/L (ref 39–117)
BUN: 16 mg/dL (ref 6–23)
Calcium: 10.2 mg/dL (ref 8.4–10.5)
Chloride: 100 mEq/L (ref 96–112)
Creatinine, Ser: 1.08 mg/dL (ref 0.40–1.50)
Total Bilirubin: 0.4 mg/dL (ref 0.3–1.2)

## 2009-07-10 ENCOUNTER — Telehealth: Payer: Self-pay | Admitting: Family Medicine

## 2009-07-11 ENCOUNTER — Ambulatory Visit: Payer: Self-pay | Admitting: Oncology

## 2009-07-15 LAB — COMPREHENSIVE METABOLIC PANEL
ALT: 36 U/L (ref 0–53)
AST: 16 U/L (ref 0–37)
Albumin: 4.8 g/dL (ref 3.5–5.2)
Alkaline Phosphatase: 56 U/L (ref 39–117)
BUN: 18 mg/dL (ref 6–23)
Calcium: 10.1 mg/dL (ref 8.4–10.5)
Chloride: 103 mEq/L (ref 96–112)
Potassium: 3.8 mEq/L (ref 3.5–5.3)
Sodium: 141 mEq/L (ref 135–145)
Total Protein: 7.4 g/dL (ref 6.0–8.3)

## 2009-07-15 LAB — CBC WITH DIFFERENTIAL/PLATELET
Basophils Absolute: 0 10*3/uL (ref 0.0–0.1)
EOS%: 1 % (ref 0.0–7.0)
Eosinophils Absolute: 0.1 10*3/uL (ref 0.0–0.5)
HGB: 10.7 g/dL — ABNORMAL LOW (ref 13.0–17.1)
MCH: 26.1 pg — ABNORMAL LOW (ref 27.2–33.4)
MONO%: 5.8 % (ref 0.0–14.0)
NEUT#: 4.3 10*3/uL (ref 1.5–6.5)
RBC: 4.09 10*6/uL — ABNORMAL LOW (ref 4.20–5.82)
RDW: 16.6 % — ABNORMAL HIGH (ref 11.0–14.6)
lymph#: 1.4 10*3/uL (ref 0.9–3.3)

## 2009-07-15 LAB — TESTOSTERONE: Testosterone: 38.74 ng/dL — ABNORMAL LOW (ref 350–890)

## 2009-08-15 ENCOUNTER — Ambulatory Visit: Payer: Self-pay | Admitting: Oncology

## 2009-08-19 LAB — CBC WITH DIFFERENTIAL/PLATELET
BASO%: 0.1 % (ref 0.0–2.0)
Basophils Absolute: 0 10*3/uL (ref 0.0–0.1)
EOS%: 0.6 % (ref 0.0–7.0)
HGB: 10.4 g/dL — ABNORMAL LOW (ref 13.0–17.1)
MCH: 25.8 pg — ABNORMAL LOW (ref 27.2–33.4)
MCHC: 31.6 g/dL — ABNORMAL LOW (ref 32.0–36.0)
MCV: 81.6 fL (ref 79.3–98.0)
MONO%: 7.2 % (ref 0.0–14.0)
RBC: 4.04 10*6/uL — ABNORMAL LOW (ref 4.20–5.82)
RDW: 15.5 % — ABNORMAL HIGH (ref 11.0–14.6)
lymph#: 1.1 10*3/uL (ref 0.9–3.3)

## 2009-08-19 LAB — COMPREHENSIVE METABOLIC PANEL
ALT: 37 U/L (ref 0–53)
AST: 15 U/L (ref 0–37)
Albumin: 4.5 g/dL (ref 3.5–5.2)
Alkaline Phosphatase: 54 U/L (ref 39–117)
BUN: 18 mg/dL (ref 6–23)
Potassium: 4.6 mEq/L (ref 3.5–5.3)

## 2009-09-26 ENCOUNTER — Ambulatory Visit: Payer: Self-pay | Admitting: Oncology

## 2009-09-30 ENCOUNTER — Ambulatory Visit: Payer: Self-pay | Admitting: Family Medicine

## 2009-09-30 DIAGNOSIS — R5383 Other fatigue: Secondary | ICD-10-CM

## 2009-09-30 DIAGNOSIS — R5381 Other malaise: Secondary | ICD-10-CM

## 2009-10-01 LAB — CBC WITH DIFFERENTIAL/PLATELET
BASO%: 0.3 % (ref 0.0–2.0)
EOS%: 0.3 % (ref 0.0–7.0)
HCT: 34.5 % — ABNORMAL LOW (ref 38.4–49.9)
LYMPH%: 19.7 % (ref 14.0–49.0)
MCH: 25.1 pg — ABNORMAL LOW (ref 27.2–33.4)
MCHC: 31 g/dL — ABNORMAL LOW (ref 32.0–36.0)
MCV: 80.8 fL (ref 79.3–98.0)
MONO#: 0.5 10*3/uL (ref 0.1–0.9)
MONO%: 7.8 % (ref 0.0–14.0)
NEUT%: 71.9 % (ref 39.0–75.0)
Platelets: 239 10*3/uL (ref 140–400)
RBC: 4.27 10*6/uL (ref 4.20–5.82)
WBC: 5.8 10*3/uL (ref 4.0–10.3)

## 2009-10-01 LAB — COMPREHENSIVE METABOLIC PANEL
ALT: 43 U/L (ref 0–53)
Alkaline Phosphatase: 55 U/L (ref 39–117)
CO2: 23 mEq/L (ref 19–32)
Creatinine, Ser: 0.91 mg/dL (ref 0.40–1.50)
Sodium: 137 mEq/L (ref 135–145)
Total Bilirubin: 0.4 mg/dL (ref 0.3–1.2)
Total Protein: 7.2 g/dL (ref 6.0–8.3)

## 2009-10-01 LAB — PSA: PSA: 229.2 ng/mL — ABNORMAL HIGH (ref 0.10–4.00)

## 2009-10-02 LAB — CONVERTED CEMR LAB
ALT: 49 units/L (ref 0–53)
AST: 27 units/L (ref 0–37)
Alkaline Phosphatase: 58 units/L (ref 39–117)
BUN: 19 mg/dL (ref 6–23)
Basophils Relative: 1.6 % (ref 0.0–3.0)
Bilirubin, Direct: 0.1 mg/dL (ref 0.0–0.3)
Chloride: 105 meq/L (ref 96–112)
Creatinine, Ser: 1 mg/dL (ref 0.4–1.5)
Eosinophils Relative: 2.1 % (ref 0.0–5.0)
Folate: 19.8 ng/mL
GFR calc non Af Amer: 97.95 mL/min (ref >60)
Iron: 122 ug/dL (ref 42–165)
LDL Cholesterol: 80 mg/dL (ref 0–99)
MCV: 81.9 fL (ref 78.0–100.0)
Microalb Creat Ratio: 5.8 mg/g (ref 0.0–30.0)
Monocytes Absolute: 0.3 10*3/uL (ref 0.1–1.0)
Monocytes Relative: 4.9 % (ref 3.0–12.0)
Neutrophils Relative %: 64.2 % (ref 43.0–77.0)
Platelets: 237 10*3/uL (ref 150.0–400.0)
Potassium: 3.8 meq/L (ref 3.5–5.1)
RBC: 4.02 M/uL — ABNORMAL LOW (ref 4.22–5.81)
Saturation Ratios: 36.1 % (ref 20.0–50.0)
Total Bilirubin: 0.5 mg/dL (ref 0.3–1.2)
Total CHOL/HDL Ratio: 3
Total Protein: 7.3 g/dL (ref 6.0–8.3)
Transferrin: 241.7 mg/dL (ref 212.0–360.0)
Triglycerides: 116 mg/dL (ref 0.0–149.0)
VLDL: 23.2 mg/dL (ref 0.0–40.0)
Vitamin B-12: 386 pg/mL (ref 211–911)
WBC: 5.5 10*3/uL (ref 4.5–10.5)

## 2009-10-31 ENCOUNTER — Ambulatory Visit: Payer: Self-pay | Admitting: Oncology

## 2009-11-04 ENCOUNTER — Ambulatory Visit (HOSPITAL_COMMUNITY): Admission: RE | Admit: 2009-11-04 | Discharge: 2009-11-04 | Payer: Self-pay | Admitting: Oncology

## 2009-11-12 LAB — CBC WITH DIFFERENTIAL/PLATELET
BASO%: 1 % (ref 0.0–2.0)
Basophils Absolute: 0.1 10*3/uL (ref 0.0–0.1)
EOS%: 0.6 % (ref 0.0–7.0)
HCT: 35.6 % — ABNORMAL LOW (ref 38.4–49.9)
LYMPH%: 24.4 % (ref 14.0–49.0)
MCH: 25.1 pg — ABNORMAL LOW (ref 27.2–33.4)
MCHC: 31.2 g/dL — ABNORMAL LOW (ref 32.0–36.0)
MONO#: 0.5 10*3/uL (ref 0.1–0.9)
NEUT%: 66.3 % (ref 39.0–75.0)
Platelets: 256 10*3/uL (ref 140–400)

## 2009-11-12 LAB — COMPREHENSIVE METABOLIC PANEL
ALT: 41 U/L (ref 0–53)
BUN: 18 mg/dL (ref 6–23)
CO2: 25 mEq/L (ref 19–32)
Creatinine, Ser: 1.06 mg/dL (ref 0.40–1.50)
Total Bilirubin: 0.4 mg/dL (ref 0.3–1.2)

## 2009-11-18 ENCOUNTER — Ambulatory Visit (HOSPITAL_COMMUNITY): Admission: RE | Admit: 2009-11-18 | Discharge: 2009-11-18 | Payer: Self-pay | Admitting: Oncology

## 2009-12-02 ENCOUNTER — Ambulatory Visit: Payer: Self-pay | Admitting: Oncology

## 2009-12-03 LAB — CBC WITH DIFFERENTIAL/PLATELET
BASO%: 1.2 % (ref 0.0–2.0)
Eosinophils Absolute: 0.1 10*3/uL (ref 0.0–0.5)
MCHC: 30.3 g/dL — ABNORMAL LOW (ref 32.0–36.0)
MCV: 81.3 fL (ref 79.3–98.0)
MONO#: 0.4 10*3/uL (ref 0.1–0.9)
MONO%: 5.1 % (ref 0.0–14.0)
NEUT#: 5.2 10*3/uL (ref 1.5–6.5)
RBC: 4.02 10*6/uL — ABNORMAL LOW (ref 4.20–5.82)
RDW: 15.7 % — ABNORMAL HIGH (ref 11.0–14.6)
WBC: 7.3 10*3/uL (ref 4.0–10.3)

## 2009-12-04 LAB — COMPREHENSIVE METABOLIC PANEL
ALT: 47 U/L (ref 0–53)
Albumin: 4.3 g/dL (ref 3.5–5.2)
Alkaline Phosphatase: 54 U/L (ref 39–117)
Glucose, Bld: 140 mg/dL — ABNORMAL HIGH (ref 70–99)
Potassium: 3.9 mEq/L (ref 3.5–5.3)
Sodium: 140 mEq/L (ref 135–145)
Total Bilirubin: 0.4 mg/dL (ref 0.3–1.2)
Total Protein: 6.6 g/dL (ref 6.0–8.3)

## 2009-12-24 LAB — COMPREHENSIVE METABOLIC PANEL
ALT: 46 U/L (ref 0–53)
AST: 35 U/L (ref 0–37)
Albumin: 4.1 g/dL (ref 3.5–5.2)
CO2: 23 mEq/L (ref 19–32)
Calcium: 9.9 mg/dL (ref 8.4–10.5)
Chloride: 104 mEq/L (ref 96–112)
Potassium: 3.3 mEq/L — ABNORMAL LOW (ref 3.5–5.3)
Total Protein: 6.5 g/dL (ref 6.0–8.3)

## 2009-12-24 LAB — CBC WITH DIFFERENTIAL/PLATELET
Eosinophils Absolute: 0 10*3/uL (ref 0.0–0.5)
HCT: 33.4 % — ABNORMAL LOW (ref 38.4–49.9)
HGB: 10 g/dL — ABNORMAL LOW (ref 13.0–17.1)
LYMPH%: 33.8 % (ref 14.0–49.0)
MONO#: 0.5 10*3/uL (ref 0.1–0.9)
NEUT%: 55 % (ref 39.0–75.0)
Platelets: 274 10*3/uL (ref 140–400)

## 2009-12-24 LAB — PSA: PSA: 196.56 ng/mL — ABNORMAL HIGH (ref 0.10–4.00)

## 2010-01-10 ENCOUNTER — Ambulatory Visit: Payer: Self-pay | Admitting: Oncology

## 2010-01-14 LAB — COMPREHENSIVE METABOLIC PANEL
ALT: 42 U/L (ref 0–53)
AST: 32 U/L (ref 0–37)
Albumin: 4.1 g/dL (ref 3.5–5.2)
Alkaline Phosphatase: 60 U/L (ref 39–117)
Potassium: 3.3 mEq/L — ABNORMAL LOW (ref 3.5–5.3)
Sodium: 139 mEq/L (ref 135–145)
Total Bilirubin: 0.6 mg/dL (ref 0.3–1.2)
Total Protein: 6.4 g/dL (ref 6.0–8.3)

## 2010-01-14 LAB — CBC WITH DIFFERENTIAL/PLATELET
BASO%: 2 % (ref 0.0–2.0)
Basophils Absolute: 0.1 10*3/uL (ref 0.0–0.1)
EOS%: 0.7 % (ref 0.0–7.0)
HGB: 10.2 g/dL — ABNORMAL LOW (ref 13.0–17.1)
MCH: 23.8 pg — ABNORMAL LOW (ref 27.2–33.4)
MCHC: 29.9 g/dL — ABNORMAL LOW (ref 32.0–36.0)
MONO%: 12.8 % (ref 0.0–14.0)
RBC: 4.28 10*6/uL (ref 4.20–5.82)
RDW: 16.1 % — ABNORMAL HIGH (ref 11.0–14.6)
lymph#: 1.6 10*3/uL (ref 0.9–3.3)
nRBC: 0 % (ref 0–0)

## 2010-02-04 LAB — CBC WITH DIFFERENTIAL/PLATELET
BASO%: 2 % (ref 0.0–2.0)
Eosinophils Absolute: 0 10*3/uL (ref 0.0–0.5)
HCT: 35.5 % — ABNORMAL LOW (ref 38.4–49.9)
LYMPH%: 27.5 % (ref 14.0–49.0)
MCHC: 29.9 g/dL — ABNORMAL LOW (ref 32.0–36.0)
MONO#: 0.6 10*3/uL (ref 0.1–0.9)
NEUT#: 3.7 10*3/uL (ref 1.5–6.5)
NEUT%: 60.6 % (ref 39.0–75.0)
Platelets: 258 10*3/uL (ref 140–400)
RBC: 4.47 10*6/uL (ref 4.20–5.82)
WBC: 6.1 10*3/uL (ref 4.0–10.3)
lymph#: 1.7 10*3/uL (ref 0.9–3.3)
nRBC: 0 % (ref 0–0)

## 2010-02-04 LAB — COMPREHENSIVE METABOLIC PANEL
Alkaline Phosphatase: 55 U/L (ref 39–117)
BUN: 12 mg/dL (ref 6–23)
CO2: 23 mEq/L (ref 19–32)
Glucose, Bld: 129 mg/dL — ABNORMAL HIGH (ref 70–99)
Total Bilirubin: 0.4 mg/dL (ref 0.3–1.2)
Total Protein: 6.3 g/dL (ref 6.0–8.3)

## 2010-02-04 LAB — PSA: PSA: 79.27 ng/mL — ABNORMAL HIGH (ref 0.10–4.00)

## 2010-02-12 ENCOUNTER — Ambulatory Visit: Payer: Self-pay | Admitting: Psychiatry

## 2010-02-21 ENCOUNTER — Ambulatory Visit: Payer: Self-pay | Admitting: Oncology

## 2010-02-25 LAB — COMPREHENSIVE METABOLIC PANEL
AST: 22 U/L (ref 0–37)
Albumin: 3.8 g/dL (ref 3.5–5.2)
BUN: 13 mg/dL (ref 6–23)
Calcium: 9.6 mg/dL (ref 8.4–10.5)
Chloride: 102 mEq/L (ref 96–112)
Glucose, Bld: 172 mg/dL — ABNORMAL HIGH (ref 70–99)
Potassium: 3.3 mEq/L — ABNORMAL LOW (ref 3.5–5.3)
Total Protein: 6.2 g/dL (ref 6.0–8.3)

## 2010-02-25 LAB — CBC WITH DIFFERENTIAL/PLATELET
Basophils Absolute: 0.1 10*3/uL (ref 0.0–0.1)
Eosinophils Absolute: 0 10*3/uL (ref 0.0–0.5)
HGB: 10.1 g/dL — ABNORMAL LOW (ref 13.0–17.1)
NEUT#: 2.3 10*3/uL (ref 1.5–6.5)
RDW: 16.2 % — ABNORMAL HIGH (ref 11.0–14.6)
WBC: 4.2 10*3/uL (ref 4.0–10.3)
lymph#: 1.3 10*3/uL (ref 0.9–3.3)

## 2010-02-26 ENCOUNTER — Ambulatory Visit: Payer: Self-pay | Admitting: Psychiatry

## 2010-03-18 LAB — COMPREHENSIVE METABOLIC PANEL
AST: 21 U/L (ref 0–37)
Alkaline Phosphatase: 55 U/L (ref 39–117)
BUN: 17 mg/dL (ref 6–23)
Creatinine, Ser: 0.79 mg/dL (ref 0.40–1.50)
Total Bilirubin: 0.4 mg/dL (ref 0.3–1.2)

## 2010-03-18 LAB — CBC WITH DIFFERENTIAL/PLATELET
Basophils Absolute: 0.1 10*3/uL (ref 0.0–0.1)
Eosinophils Absolute: 0 10*3/uL (ref 0.0–0.5)
HCT: 33.9 % — ABNORMAL LOW (ref 38.4–49.9)
HGB: 10.1 g/dL — ABNORMAL LOW (ref 13.0–17.1)
LYMPH%: 29.5 % (ref 14.0–49.0)
MCV: 77.9 fL — ABNORMAL LOW (ref 79.3–98.0)
MONO%: 9.3 % (ref 0.0–14.0)
NEUT#: 3.2 10*3/uL (ref 1.5–6.5)
Platelets: 218 10*3/uL (ref 140–400)
RDW: 16.8 % — ABNORMAL HIGH (ref 11.0–14.6)

## 2010-04-04 ENCOUNTER — Ambulatory Visit: Payer: Self-pay | Admitting: Oncology

## 2010-04-08 LAB — PSA: PSA: 73.19 ng/mL — ABNORMAL HIGH (ref 0.10–4.00)

## 2010-04-08 LAB — COMPREHENSIVE METABOLIC PANEL
CO2: 25 mEq/L (ref 19–32)
Glucose, Bld: 155 mg/dL — ABNORMAL HIGH (ref 70–99)
Sodium: 139 mEq/L (ref 135–145)
Total Bilirubin: 0.6 mg/dL (ref 0.3–1.2)
Total Protein: 6.2 g/dL (ref 6.0–8.3)

## 2010-04-08 LAB — CBC WITH DIFFERENTIAL/PLATELET
BASO%: 1.6 % (ref 0.0–2.0)
HCT: 33.5 % — ABNORMAL LOW (ref 38.4–49.9)
MCHC: 30.1 g/dL — ABNORMAL LOW (ref 32.0–36.0)
MONO#: 0.3 10*3/uL (ref 0.1–0.9)
RBC: 4.28 10*6/uL (ref 4.20–5.82)
RDW: 17.2 % — ABNORMAL HIGH (ref 11.0–14.6)
WBC: 4.4 10*3/uL (ref 4.0–10.3)
lymph#: 0.9 10*3/uL (ref 0.9–3.3)
nRBC: 0 % (ref 0–0)

## 2010-04-24 IMAGING — XA IR FLUORO GUIDE CV LINE*R*
1 series · 1 of 1 positions shown · non-contrast
Comparison: none

CLINICAL DATA: Recurrent prostate carcinoma

[Series 300: line placements · 1 of 1 slices shown]
[im 1/1]
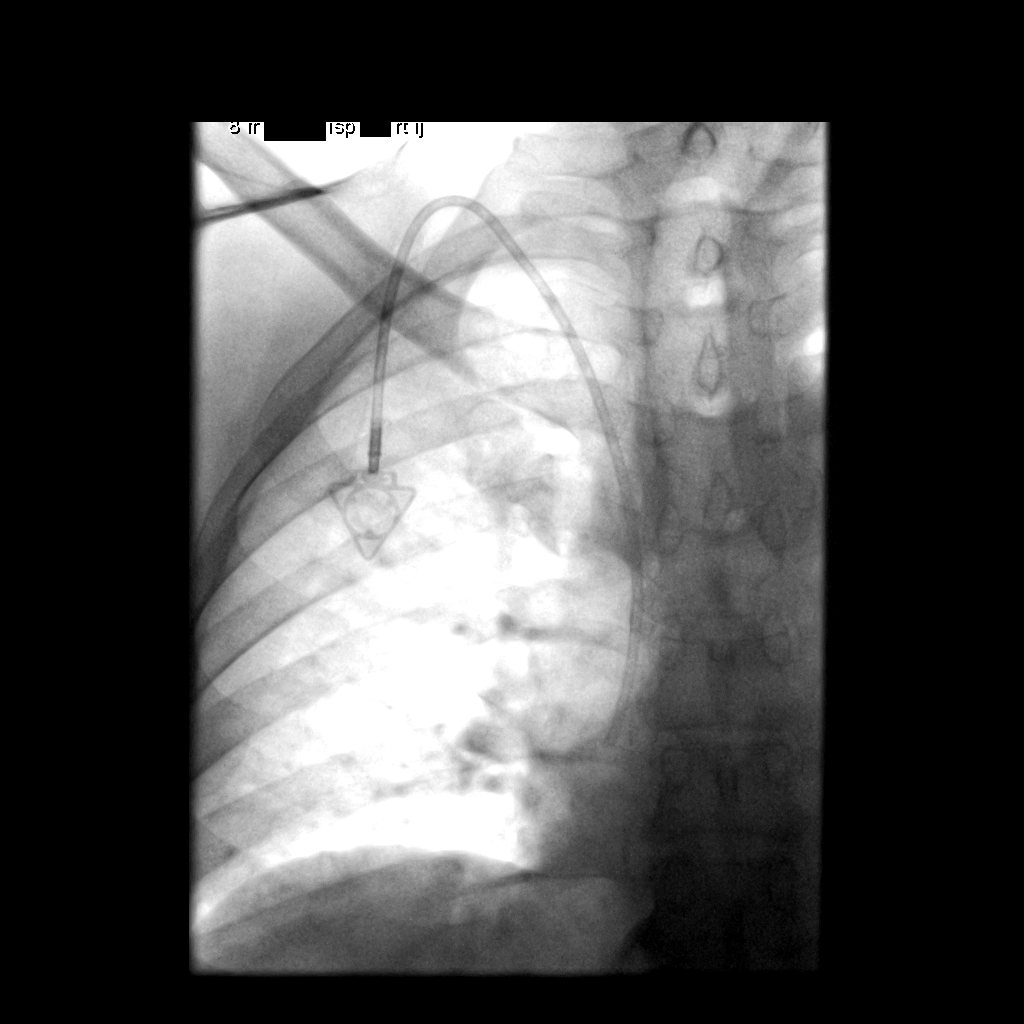

[1 of 1 positions shown; findings below may reference images not displayed]

ULTRASOUND GUIDANCE FOR VASCULAR ACCESS
RIGHT INTERNAL JUGULAR SINGLE LUMEN POWER PORT CATHETER INSERTION

Date: 11/18/2009 [DATE]

Radiologist:  Tripti Tiger, M.D.

Medications:  1 gram ancefadministered within 1 hour of the
procedure.2 mg Versed, 100 mcg Fentanyl

Guidance:  Ultrasound fluoroscopic

Fluoroscopy time:  0.4 minutes

Sedation time:  40 minutes

Contrast volume:  None.

Complications:  No immediate

PROCEDURE/FINDINGS:

Informed consent was obtained from the patient following
explanation of the procedure, risks, benefits and alternatives.
The patient understands, agrees and consents for the procedure.
All questions were addressed.  A time out was performed.

Maximal barrier sterile technique utilized including caps, mask,
sterile gowns, sterile gloves, large sterile drape, hand hygiene,
and 2% chlorhexidine scrub.

Under sterile conditions and local anesthesia, right internal
jugular micropuncture venous access was performed.  Access was
performed with ultrasound.  Images were obtained for documentation.
A guide wire was inserted followed by a transitional dilator.  This
allowed insertion of a guide wire and catheter into the IVC.
Measurements were obtained from the SVC / RA junction back to the
right IJ venotomy site.  In the right infraclavicular chest, a
subcutaneous pocket was created over the second anterior rib.  This
was done under sterile conditions and local anesthesia.  1%
lidocaine with epinephrine was utilized for this.  A 2.5 cm
incision was made in the skin.  Blunt dissection was performed to
create a subcutaneous pocket over the right pectoralis major
muscle.  The pocket was flushed with saline vigorously.  There was
adequate hemostasis.  The port catheter was assembled and checked
for leakage.  The port catheter was secured in the pocket with two
retention sutures.  The tubing was tunneled subcutaneously to the
right venotomy site and inserted into the SVC/RA junction through a
valved peel-away sheath.  Position was confirmed with fluoroscopy.
Images were obtained for documentation.  The patient tolerated the
procedure well.  No immediate complications.  Incisions were closed
in a two layer fashion with 4 - 0 Vicryl suture.  Dermabond was
applied to the skin. The port catheter was accessed, blood was
aspirated followed by saline and heparin flushes.  Needle was
removed.  A dry sterile dressing was applied.
IMPRESSION: Ultrasound and fluoroscopically guided right internal jugular
single lumen power port catheter insertion.  Tip in the SVC/RA
junction.  Catheter ready for use.

## 2010-04-29 LAB — CBC WITH DIFFERENTIAL/PLATELET
Basophils Absolute: 0.1 10*3/uL (ref 0.0–0.1)
EOS%: 0.3 % (ref 0.0–7.0)
HCT: 31.2 % — ABNORMAL LOW (ref 38.4–49.9)
HGB: 9.4 g/dL — ABNORMAL LOW (ref 13.0–17.1)
MCH: 23.8 pg — ABNORMAL LOW (ref 27.2–33.4)
MCHC: 30.1 g/dL — ABNORMAL LOW (ref 32.0–36.0)
MCV: 79 fL — ABNORMAL LOW (ref 79.3–98.0)
MONO%: 10.2 % (ref 0.0–14.0)
NEUT%: 60.2 % (ref 39.0–75.0)

## 2010-04-29 LAB — COMPREHENSIVE METABOLIC PANEL
ALT: 23 U/L (ref 0–53)
AST: 19 U/L (ref 0–37)
Albumin: 3.6 g/dL (ref 3.5–5.2)
Calcium: 9.3 mg/dL (ref 8.4–10.5)
Chloride: 108 mEq/L (ref 96–112)
Creatinine, Ser: 0.8 mg/dL (ref 0.40–1.50)
Potassium: 3.3 mEq/L — ABNORMAL LOW (ref 3.5–5.3)

## 2010-05-16 ENCOUNTER — Ambulatory Visit: Payer: Self-pay | Admitting: Oncology

## 2010-05-20 LAB — CBC WITH DIFFERENTIAL/PLATELET
Basophils Absolute: 0.1 10*3/uL (ref 0.0–0.1)
Eosinophils Absolute: 0 10*3/uL (ref 0.0–0.5)
HGB: 10.5 g/dL — ABNORMAL LOW (ref 13.0–17.1)
LYMPH%: 22 % (ref 14.0–49.0)
MONO#: 0.6 10*3/uL (ref 0.1–0.9)
NEUT#: 3.9 10*3/uL (ref 1.5–6.5)
Platelets: 255 10*3/uL (ref 140–400)
RBC: 4.45 10*6/uL (ref 4.20–5.82)
WBC: 5.9 10*3/uL (ref 4.0–10.3)
nRBC: 0 % (ref 0–0)

## 2010-05-20 LAB — COMPREHENSIVE METABOLIC PANEL
Albumin: 3.9 g/dL (ref 3.5–5.2)
Alkaline Phosphatase: 57 U/L (ref 39–117)
BUN: 20 mg/dL (ref 6–23)
Creatinine, Ser: 0.8 mg/dL (ref 0.40–1.50)
Glucose, Bld: 130 mg/dL — ABNORMAL HIGH (ref 70–99)
Potassium: 3.7 mEq/L (ref 3.5–5.3)
Total Bilirubin: 0.5 mg/dL (ref 0.3–1.2)

## 2010-06-09 ENCOUNTER — Ambulatory Visit (HOSPITAL_COMMUNITY): Admission: RE | Admit: 2010-06-09 | Discharge: 2010-06-09 | Payer: Self-pay | Admitting: Oncology

## 2010-06-09 LAB — CBC WITH DIFFERENTIAL/PLATELET
Basophils Absolute: 0 10*3/uL (ref 0.0–0.1)
HCT: 30.8 % — ABNORMAL LOW (ref 38.4–49.9)
HGB: 9.4 g/dL — ABNORMAL LOW (ref 13.0–17.1)
MCH: 23.4 pg — ABNORMAL LOW (ref 27.2–33.4)
MONO#: 0.4 10*3/uL (ref 0.1–0.9)
NEUT%: 69.8 % (ref 39.0–75.0)
WBC: 4.6 10*3/uL (ref 4.0–10.3)
lymph#: 1 10*3/uL (ref 0.9–3.3)

## 2010-06-09 LAB — COMPREHENSIVE METABOLIC PANEL
BUN: 17 mg/dL (ref 6–23)
CO2: 21 mEq/L (ref 19–32)
Calcium: 9.1 mg/dL (ref 8.4–10.5)
Chloride: 107 mEq/L (ref 96–112)
Creatinine, Ser: 0.67 mg/dL (ref 0.40–1.50)
Glucose, Bld: 127 mg/dL — ABNORMAL HIGH (ref 70–99)

## 2010-06-09 LAB — PSA: PSA: 116.42 ng/mL — ABNORMAL HIGH (ref 0.10–4.00)

## 2010-06-11 ENCOUNTER — Encounter: Payer: Self-pay | Admitting: Family Medicine

## 2010-07-04 ENCOUNTER — Ambulatory Visit: Payer: Self-pay | Admitting: Oncology

## 2010-07-08 ENCOUNTER — Encounter: Payer: Self-pay | Admitting: Family Medicine

## 2010-07-08 LAB — CBC WITH DIFFERENTIAL/PLATELET
Eosinophils Absolute: 0 10*3/uL (ref 0.0–0.5)
LYMPH%: 19.5 % (ref 14.0–49.0)
MCV: 74 fL — ABNORMAL LOW (ref 79.3–98.0)
MONO%: 5.8 % (ref 0.0–14.0)
NEUT#: 2.2 10*3/uL (ref 1.5–6.5)
Platelets: 198 10*3/uL (ref 140–400)
RBC: 4.65 10*6/uL (ref 4.20–5.82)
nRBC: 0 % (ref 0–0)

## 2010-07-10 LAB — CBC WITH DIFFERENTIAL/PLATELET
Basophils Absolute: 0 10*3/uL (ref 0.0–0.1)
EOS%: 2.2 % (ref 0.0–7.0)
HCT: 31.9 % — ABNORMAL LOW (ref 38.4–49.9)
HGB: 10.2 g/dL — ABNORMAL LOW (ref 13.0–17.1)
LYMPH%: 20.8 % (ref 14.0–49.0)
MCH: 23.9 pg — ABNORMAL LOW (ref 27.2–33.4)
MCHC: 31.9 g/dL — ABNORMAL LOW (ref 32.0–36.0)
MONO#: 0.3 10*3/uL (ref 0.1–0.9)
NEUT%: 69.7 % (ref 39.0–75.0)
Platelets: 216 10*3/uL (ref 140–400)
lymph#: 0.9 10*3/uL (ref 0.9–3.3)

## 2010-07-11 LAB — COMPREHENSIVE METABOLIC PANEL
Albumin: 3.9 g/dL (ref 3.5–5.2)
BUN: 16 mg/dL (ref 6–23)
Calcium: 9.2 mg/dL (ref 8.4–10.5)
Chloride: 102 mEq/L (ref 96–112)
Creatinine, Ser: 0.71 mg/dL (ref 0.40–1.50)
Glucose, Bld: 114 mg/dL — ABNORMAL HIGH (ref 70–99)
Potassium: 3.7 mEq/L (ref 3.5–5.3)

## 2010-08-05 ENCOUNTER — Ambulatory Visit: Payer: Self-pay | Admitting: Oncology

## 2010-08-07 LAB — CBC WITH DIFFERENTIAL/PLATELET
Basophils Absolute: 0.1 10*3/uL (ref 0.0–0.1)
Eosinophils Absolute: 0.1 10*3/uL (ref 0.0–0.5)
HCT: 32 % — ABNORMAL LOW (ref 38.4–49.9)
HGB: 10 g/dL — ABNORMAL LOW (ref 13.0–17.1)
MONO#: 0.3 10*3/uL (ref 0.1–0.9)
NEUT#: 3 10*3/uL (ref 1.5–6.5)
RDW: 16.2 % — ABNORMAL HIGH (ref 11.0–14.6)
lymph#: 1.5 10*3/uL (ref 0.9–3.3)

## 2010-08-08 LAB — COMPREHENSIVE METABOLIC PANEL
Albumin: 4.2 g/dL (ref 3.5–5.2)
Alkaline Phosphatase: 80 U/L (ref 39–117)
CO2: 25 mEq/L (ref 19–32)
Calcium: 9.6 mg/dL (ref 8.4–10.5)
Chloride: 102 mEq/L (ref 96–112)
Glucose, Bld: 91 mg/dL (ref 70–99)
Potassium: 3.7 mEq/L (ref 3.5–5.3)
Sodium: 138 mEq/L (ref 135–145)
Total Protein: 6.5 g/dL (ref 6.0–8.3)

## 2010-09-01 ENCOUNTER — Encounter: Payer: Self-pay | Admitting: Oncology

## 2010-09-03 LAB — CBC WITH DIFFERENTIAL/PLATELET
BASO%: 0.6 % (ref 0.0–2.0)
Basophils Absolute: 0 10*3/uL (ref 0.0–0.1)
EOS%: 1.1 % (ref 0.0–7.0)
Eosinophils Absolute: 0.1 10*3/uL (ref 0.0–0.5)
HCT: 33.5 % — ABNORMAL LOW (ref 38.4–49.9)
HGB: 10.4 g/dL — ABNORMAL LOW (ref 13.0–17.1)
LYMPH%: 19.5 % (ref 14.0–49.0)
MCH: 23.3 pg — ABNORMAL LOW (ref 27.2–33.4)
MCHC: 31 g/dL — ABNORMAL LOW (ref 32.0–36.0)
MCV: 75.1 fL — ABNORMAL LOW (ref 79.3–98.0)
MONO#: 0.3 10*3/uL (ref 0.1–0.9)
MONO%: 5.4 % (ref 0.0–14.0)
NEUT#: 3.8 10*3/uL (ref 1.5–6.5)
NEUT%: 73.4 % (ref 39.0–75.0)
Platelets: 244 10*3/uL (ref 140–400)
RBC: 4.46 10*6/uL (ref 4.20–5.82)
RDW: 16.4 % — ABNORMAL HIGH (ref 11.0–14.6)
WBC: 5.2 10*3/uL (ref 4.0–10.3)
lymph#: 1 10*3/uL (ref 0.9–3.3)
nRBC: 0 % (ref 0–0)

## 2010-09-03 LAB — COMPREHENSIVE METABOLIC PANEL
ALT: 12 U/L (ref 0–53)
AST: 12 U/L (ref 0–37)
Albumin: 4.4 g/dL (ref 3.5–5.2)
Alkaline Phosphatase: 71 U/L (ref 39–117)
BUN: 18 mg/dL (ref 6–23)
CO2: 22 mEq/L (ref 19–32)
Calcium: 9.6 mg/dL (ref 8.4–10.5)
Chloride: 101 mEq/L (ref 96–112)
Creatinine, Ser: 0.7 mg/dL (ref 0.40–1.50)
Glucose, Bld: 121 mg/dL — ABNORMAL HIGH (ref 70–99)
Potassium: 4 mEq/L (ref 3.5–5.3)
Sodium: 137 mEq/L (ref 135–145)
Total Bilirubin: 0.5 mg/dL (ref 0.3–1.2)
Total Protein: 7.1 g/dL (ref 6.0–8.3)

## 2010-09-03 LAB — PSA: PSA: 203.12 ng/mL — ABNORMAL HIGH (ref <=4.00)

## 2010-09-09 NOTE — Assessment & Plan Note (Signed)
Summary: feeling tired at times//ccm   Vital Signs:  Patient profile:   60 year old male Height:      67 inches Weight:      204 pounds BMI:     32.07 Temp:     98.7 degrees F oral BP sitting:   142 / 80  (left arm) Cuff size:   regular  Vitals Entered By: Kern Reap CMA Duncan Dull) (September 30, 2009 4:30 PM)  Reason for Visit tired  Primary Care Provider:  Fredia Sorrow, MD   History of Present Illness: Andrew Nelson a 60 year old male, nonsmoker, who comes in today for evaluation of fatigue.  His oncologist, start him on prednisone 5 mg b.i.d., and ketoconazole 200 mg t.i.d., and September because of elevated PSA that now decrease his dose to prednisone 5 mg daily and ketoconazole 400 mg daily.  He states he feels tired and fatigued.  Review of systems negative.  Blood sugars at home 120 to 130 on metformin 850 mg b.i.d. and NovoLog 7030 mix 20 units daily.  BP 142/80 on hydrochlorothiazide 25 mg daily, Cardura 8 mg daily, and Zestril, 10 mg nightly  Allergies: No Known Drug Allergies  Past History:  Past medical, surgical, family and social histories (including risk factors) reviewed, and no changes noted (except as noted below).  Past Medical History: Reviewed history from 10/09/2008 and no changes required. ED TA Hypertension prostate cancer 2008 - status post surgery followed by radiotherapy Diabetes mellitus, type II , insulin requiring Adenomatous Colon Polyps Anemia-NOS  Past Surgical History: Reviewed history from 05/28/2008 and no changes required. Prostatectomy 2005  Family History: Reviewed history from 04/12/2007 and no changes required. Family History of Prostate CA 1st degree relative <50 Family History of Stroke M 1st degree relative <50  Social History: Reviewed history from 05/28/2008 and no changes required. Occupation:[employer] Married  Patient is a former smoker.  Alcohol Use - no Illicit Drug Use - no Patient gets regular  exercise.  Review of Systems      See HPI  Physical Exam  General:  Well-developed,well-nourished,in no acute distress; alert,appropriate and cooperative throughout examination   Problems:  Medical Problems Added: 1)  Dx of Fatigue  (ICD-780.79)  Impression & Recommendations:  Problem # 1:  DIABETES MELLITUS, TYPE I (ICD-250.01) Assessment Improved  His updated medication list for this problem includes:    Metformin Hcl 850 Mg Tabs (Metformin hcl) .Marland Kitchen..Marland Kitchen Two times a day    Zestril 10 Mg Tabs (Lisinopril) .Marland Kitchen... Take 1 tablet by mouth every morning    Novolog Mix 70/30 70-30 % Susp (Insulin aspart prot & aspart) .Marland Kitchen... 20 u daily  Orders: Venipuncture (29562) TLB-Lipid Panel (80061-LIPID) TLB-BMP (Basic Metabolic Panel-BMET) (80048-METABOL) TLB-CBC Platelet - w/Differential (85025-CBCD) TLB-Hepatic/Liver Function Pnl (80076-HEPATIC) TLB-TSH (Thyroid Stimulating Hormone) (84443-TSH) TLB-A1C / Hgb A1C (Glycohemoglobin) (83036-A1C) TLB-Microalbumin/Creat Ratio, Urine (82043-MALB)  Problem # 2:  HYPERTENSION (ICD-401.9) Assessment: Improved  His updated medication list for this problem includes:    Doxazosin Mesylate 8 Mg Tabs (Doxazosin mesylate) .Marland Kitchen... 1 once daily    Hydrochlorothiazide 25 Mg Tabs (Hydrochlorothiazide) .Marland Kitchen... 1 once daily    Zestril 10 Mg Tabs (Lisinopril) .Marland Kitchen... Take 1 tablet by mouth every morning  Orders: Venipuncture (13086) TLB-Lipid Panel (80061-LIPID) TLB-BMP (Basic Metabolic Panel-BMET) (80048-METABOL) TLB-CBC Platelet - w/Differential (85025-CBCD) TLB-Hepatic/Liver Function Pnl (80076-HEPATIC) TLB-TSH (Thyroid Stimulating Hormone) (84443-TSH) TLB-A1C / Hgb A1C (Glycohemoglobin) (83036-A1C) TLB-Microalbumin/Creat Ratio, Urine (82043-MALB)  Problem # 3:  FATIGUE (ICD-780.79) Assessment: New  Orders: Venipuncture (57846) TLB-Lipid Panel (80061-LIPID)  TLB-BMP (Basic Metabolic Panel-BMET) (80048-METABOL) TLB-CBC Platelet - w/Differential  (85025-CBCD) TLB-Hepatic/Liver Function Pnl (80076-HEPATIC) TLB-TSH (Thyroid Stimulating Hormone) (84443-TSH) TLB-A1C / Hgb A1C (Glycohemoglobin) (83036-A1C) TLB-Microalbumin/Creat Ratio, Urine (82043-MALB) TLB-B12 + Folate Pnl (16109_60454-U98/JXB) TLB-IBC Pnl (Iron/FE;Transferrin) (83550-IBC)  Complete Medication List: 1)  Doxazosin Mesylate 8 Mg Tabs (Doxazosin mesylate) .Marland Kitchen.. 1 once daily 2)  Hydrochlorothiazide 25 Mg Tabs (Hydrochlorothiazide) .Marland Kitchen.. 1 once daily 3)  Citracal + D 250-200 Mg-unit Tabs (Calcium citrate-vitamin d) .... Once daily 4)  Metformin Hcl 850 Mg Tabs (Metformin hcl) .... Two times a day 5)  Zestril 10 Mg Tabs (Lisinopril) .... Take 1 tablet by mouth every morning 6)  Fish Oil Double Strength 1200 Mg Caps (Omega-3 fatty acids) .... Once daily 7)  Vitamin C 1000 Mg Tabs (Ascorbic acid) .... Once daily 8)  Novolog Mix 70/30 70-30 % Susp (Insulin aspart prot & aspart) .... 20 u daily 9)  Accu-chek Aviva Strp (Glucose blood) .... Use as directed 10)  Iron 325 (65 Fe) Mg Tabs (Ferrous sulfate) .... Once daily 11)  Prednisone 5 Mg/57ml Soln (Prednisone) .... Take one tab two times a day 12)  Ketoconazole 200 Mg Tabs (Ketoconazole) .... Take one tab three times a day  Patient Instructions: 1)  continue your current medications two.  I will call you when additional lab work back

## 2010-09-09 NOTE — Letter (Signed)
Summary: [facility]  [facility]   Imported By: Maryln Gottron 07/11/2010 14:04:22  _____________________________________________________________________  External Attachment:    Type:   Image     Comment:   External Document

## 2010-09-09 NOTE — Letter (Signed)
Summary: [facility]  [facility]   Imported By: Maryln Gottron 06/23/2010 09:25:23  _____________________________________________________________________  External Attachment:    Type:   Image     Comment:   External Document

## 2010-10-14 ENCOUNTER — Other Ambulatory Visit: Payer: Self-pay | Admitting: Oncology

## 2010-10-14 ENCOUNTER — Encounter (HOSPITAL_BASED_OUTPATIENT_CLINIC_OR_DEPARTMENT_OTHER): Payer: BC Managed Care – PPO | Admitting: Oncology

## 2010-10-14 DIAGNOSIS — C61 Malignant neoplasm of prostate: Secondary | ICD-10-CM

## 2010-10-14 DIAGNOSIS — G609 Hereditary and idiopathic neuropathy, unspecified: Secondary | ICD-10-CM

## 2010-10-14 DIAGNOSIS — Z5111 Encounter for antineoplastic chemotherapy: Secondary | ICD-10-CM

## 2010-10-14 DIAGNOSIS — N289 Disorder of kidney and ureter, unspecified: Secondary | ICD-10-CM

## 2010-10-14 DIAGNOSIS — D6481 Anemia due to antineoplastic chemotherapy: Secondary | ICD-10-CM

## 2010-10-14 DIAGNOSIS — D649 Anemia, unspecified: Secondary | ICD-10-CM

## 2010-10-14 DIAGNOSIS — T451X5A Adverse effect of antineoplastic and immunosuppressive drugs, initial encounter: Secondary | ICD-10-CM

## 2010-10-14 LAB — COMPREHENSIVE METABOLIC PANEL
Albumin: 3.8 g/dL (ref 3.5–5.2)
Alkaline Phosphatase: 69 U/L (ref 39–117)
BUN: 15 mg/dL (ref 6–23)
CO2: 22 mEq/L (ref 19–32)
Calcium: 8.8 mg/dL (ref 8.4–10.5)
Chloride: 102 mEq/L (ref 96–112)
Glucose, Bld: 202 mg/dL — ABNORMAL HIGH (ref 70–99)
Potassium: 3.8 mEq/L (ref 3.5–5.3)
Sodium: 137 mEq/L (ref 135–145)
Total Protein: 5.9 g/dL — ABNORMAL LOW (ref 6.0–8.3)

## 2010-10-14 LAB — CBC WITH DIFFERENTIAL/PLATELET
HCT: 30.5 % — ABNORMAL LOW (ref 38.4–49.9)
LYMPH%: 21.8 % (ref 14.0–49.0)
MCH: 25.1 pg — ABNORMAL LOW (ref 27.2–33.4)
MCHC: 31.7 g/dL — ABNORMAL LOW (ref 32.0–36.0)
NEUT%: 71.7 % (ref 39.0–75.0)
Platelets: 216 10*3/uL (ref 140–400)
lymph#: 1.1 10*3/uL (ref 0.9–3.3)

## 2010-10-14 LAB — PSA: PSA: 134.25 ng/mL — ABNORMAL HIGH (ref <=4.00)

## 2010-10-29 LAB — GLUCOSE, CAPILLARY: Glucose-Capillary: 246 mg/dL — ABNORMAL HIGH (ref 70–99)

## 2010-11-11 ENCOUNTER — Other Ambulatory Visit: Payer: Self-pay | Admitting: Medical

## 2010-11-11 ENCOUNTER — Telehealth: Payer: Self-pay | Admitting: Family Medicine

## 2010-11-11 ENCOUNTER — Other Ambulatory Visit: Payer: Self-pay | Admitting: Oncology

## 2010-11-11 ENCOUNTER — Encounter (HOSPITAL_BASED_OUTPATIENT_CLINIC_OR_DEPARTMENT_OTHER): Payer: BC Managed Care – PPO | Admitting: Oncology

## 2010-11-11 DIAGNOSIS — G609 Hereditary and idiopathic neuropathy, unspecified: Secondary | ICD-10-CM

## 2010-11-11 DIAGNOSIS — C61 Malignant neoplasm of prostate: Secondary | ICD-10-CM

## 2010-11-11 DIAGNOSIS — E119 Type 2 diabetes mellitus without complications: Secondary | ICD-10-CM

## 2010-11-11 DIAGNOSIS — D649 Anemia, unspecified: Secondary | ICD-10-CM

## 2010-11-11 LAB — COMPREHENSIVE METABOLIC PANEL
AST: 15 U/L (ref 0–37)
Albumin: 4.1 g/dL (ref 3.5–5.2)
Alkaline Phosphatase: 61 U/L (ref 39–117)
BUN: 13 mg/dL (ref 6–23)
Calcium: 9 mg/dL (ref 8.4–10.5)
Chloride: 102 mEq/L (ref 96–112)
Glucose, Bld: 176 mg/dL — ABNORMAL HIGH (ref 70–99)
Potassium: 3.8 mEq/L (ref 3.5–5.3)
Sodium: 140 mEq/L (ref 135–145)
Total Protein: 6.4 g/dL (ref 6.0–8.3)

## 2010-11-11 LAB — CBC WITH DIFFERENTIAL/PLATELET
BASO%: 1.5 % (ref 0.0–2.0)
EOS%: 1.9 % (ref 0.0–7.0)
HCT: 31.1 % — ABNORMAL LOW (ref 38.4–49.9)
MCH: 25 pg — ABNORMAL LOW (ref 27.2–33.4)
MCHC: 31.6 g/dL — ABNORMAL LOW (ref 32.0–36.0)
MONO#: 0.2 10*3/uL (ref 0.1–0.9)
NEUT%: 72.3 % (ref 39.0–75.0)
RBC: 3.94 10*6/uL — ABNORMAL LOW (ref 4.20–5.82)
WBC: 4.4 10*3/uL (ref 4.0–10.3)
lymph#: 0.9 10*3/uL (ref 0.9–3.3)

## 2010-11-11 NOTE — Telephone Encounter (Signed)
Pt would like his cpx--v70.0 labs done at cancer center on 12-11-2010. Please fax order to [phone].

## 2010-11-12 NOTE — Telephone Encounter (Signed)
ok 

## 2010-11-12 NOTE — Telephone Encounter (Signed)
Order faxed to cancer center

## 2010-12-09 ENCOUNTER — Telehealth: Payer: Self-pay | Admitting: Family Medicine

## 2010-12-09 NOTE — Telephone Encounter (Signed)
Wife is checking on status of his cpx labs--v70.0, to be scheduled at the cancer center for this Thursday. At the time of the cpx appt  was made, a scheduler told her that she would send a message for the nurse to set up labs.

## 2010-12-10 NOTE — Telephone Encounter (Signed)
Lab order faxed.

## 2010-12-11 ENCOUNTER — Encounter (HOSPITAL_BASED_OUTPATIENT_CLINIC_OR_DEPARTMENT_OTHER): Payer: BC Managed Care – PPO | Admitting: Oncology

## 2010-12-11 ENCOUNTER — Encounter (HOSPITAL_COMMUNITY): Payer: Self-pay

## 2010-12-11 ENCOUNTER — Other Ambulatory Visit: Payer: Self-pay | Admitting: Oncology

## 2010-12-11 ENCOUNTER — Ambulatory Visit (HOSPITAL_COMMUNITY)
Admission: RE | Admit: 2010-12-11 | Discharge: 2010-12-11 | Disposition: A | Discharge status: Home / Self Care | Payer: BC Managed Care – PPO | Source: Ambulatory Visit | Attending: Oncology | Admitting: Oncology

## 2010-12-11 DIAGNOSIS — C61 Malignant neoplasm of prostate: Secondary | ICD-10-CM

## 2010-12-11 DIAGNOSIS — Z5111 Encounter for antineoplastic chemotherapy: Secondary | ICD-10-CM

## 2010-12-11 DIAGNOSIS — K7689 Other specified diseases of liver: Secondary | ICD-10-CM | POA: Insufficient documentation

## 2010-12-11 DIAGNOSIS — M7989 Other specified soft tissue disorders: Secondary | ICD-10-CM | POA: Insufficient documentation

## 2010-12-11 HISTORY — DX: Malignant neoplasm of prostate: C61

## 2010-12-11 LAB — CMP (CANCER CENTER ONLY)
ALT(SGPT): 20 U/L (ref 10–47)
AST: 22 U/L (ref 11–38)
Albumin: 3.7 g/dL (ref 3.3–5.5)
Alkaline Phosphatase: 58 U/L (ref 26–84)
Glucose, Bld: 97 mg/dL (ref 73–118)
Potassium: 4.2 mEq/L (ref 3.3–4.7)
Sodium: 140 mEq/L (ref 128–145)
Total Bilirubin: 0.5 mg/dl (ref 0.20–1.60)
Total Protein: 7.3 g/dL (ref 6.4–8.1)

## 2010-12-11 LAB — CBC WITH DIFFERENTIAL/PLATELET
BASO%: 0.5 % (ref 0.0–2.0)
EOS%: 0.8 % (ref 0.0–7.0)
Eosinophils Absolute: 0 10*3/uL (ref 0.0–0.5)
LYMPH%: 19.6 % (ref 14.0–49.0)
MCHC: 32 g/dL (ref 32.0–36.0)
MCV: 78.6 fL — ABNORMAL LOW (ref 79.3–98.0)
MONO%: 7.6 % (ref 0.0–14.0)
NEUT#: 3.5 10*3/uL (ref 1.5–6.5)
Platelets: 234 10*3/uL (ref 140–400)
RBC: 4.08 10*6/uL — ABNORMAL LOW (ref 4.20–5.82)
RDW: 15.3 % — ABNORMAL HIGH (ref 11.0–14.6)

## 2010-12-11 MED ORDER — IOHEXOL 300 MG/ML  SOLN
100.0000 mL | Freq: Once | INTRAMUSCULAR | Status: AC | PRN
  Administered 2010-12-11: 100 mL via INTRAVENOUS

## 2010-12-18 ENCOUNTER — Encounter (HOSPITAL_BASED_OUTPATIENT_CLINIC_OR_DEPARTMENT_OTHER): Payer: BC Managed Care – PPO | Admitting: Oncology

## 2010-12-18 DIAGNOSIS — C61 Malignant neoplasm of prostate: Secondary | ICD-10-CM

## 2010-12-18 DIAGNOSIS — D649 Anemia, unspecified: Secondary | ICD-10-CM

## 2010-12-19 ENCOUNTER — Encounter: Payer: Self-pay | Admitting: Family Medicine

## 2010-12-19 ENCOUNTER — Ambulatory Visit: Payer: Self-pay | Admitting: Family Medicine

## 2010-12-22 ENCOUNTER — Ambulatory Visit (INDEPENDENT_AMBULATORY_CARE_PROVIDER_SITE_OTHER): Payer: BC Managed Care – PPO | Admitting: Family Medicine

## 2010-12-22 DIAGNOSIS — I10 Essential (primary) hypertension: Secondary | ICD-10-CM

## 2010-12-22 DIAGNOSIS — E109 Type 1 diabetes mellitus without complications: Secondary | ICD-10-CM

## 2010-12-22 DIAGNOSIS — Z8546 Personal history of malignant neoplasm of prostate: Secondary | ICD-10-CM

## 2010-12-22 MED ORDER — INSULIN ASPART PROT & ASPART (70-30 MIX) 100 UNIT/ML ~~LOC~~ SUSP: 20.0000 [IU] | Freq: Every day | SUBCUTANEOUS | Status: DC

## 2010-12-22 MED ORDER — LISINOPRIL 10 MG PO TABS: 10.0000 mg | ORAL_TABLET | Freq: Every day | ORAL | Status: DC

## 2010-12-22 MED ORDER — DOXAZOSIN MESYLATE 8 MG PO TABS: 8.0000 mg | ORAL_TABLET | Freq: Every day | ORAL | Status: DC

## 2010-12-22 MED ORDER — GLUCOSE BLOOD VI STRP: ORAL_STRIP | Status: DC

## 2010-12-22 MED ORDER — METFORMIN HCL 850 MG PO TABS: 850.0000 mg | ORAL_TABLET | Freq: Two times a day (BID) | ORAL | Status: DC

## 2010-12-22 MED ORDER — HYDROCHLOROTHIAZIDE 25 MG PO TABS: 25.0000 mg | ORAL_TABLET | Freq: Every day | ORAL | Status: DC

## 2010-12-22 NOTE — Patient Instructions (Signed)
Continue your current medications.  Follow-up in 6 months, sooner if any problems

## 2010-12-22 NOTE — Progress Notes (Signed)
  Subjective:    Patient ID: Andrew Nelson, male    DOB: 01-08-1951, 60 y.o.   MRN: [id number]  HPI  Rawn Is a delightful, 60 year old, married male, nonsmoker,,,,,,,,, 2006,,, he quit,,,,,,,,, who comes in today for general physical examination because of a history of hypertension and diabetes and metastatic prostate cancer.  Four hypertension.  He takes Cardura 8 mg nightly, hydrochlorothiazide, 25 mg q.a.m., lisinopril, 10 mg daily.  BP 120/78.  The diabetes.  He takes metformin 850 mg b.i.d. And 20 units of NovoLog 7030 nightly blood sugars within normal range.  He is also undergoing treatment at the oncology center for metastatic prostate cancer.  He is under chemotherapy via oncology.  He started sure if they want him to be vaccinated to check with the oncologist.  He also has a low grade anemia, secondary to underlying disease, and the chemo.  Hemoglobin is around 10.2.  He gets routine eye care, dental care, colonoscopy, 2009,    Review of Systems  Constitutional: Negative.   HENT: Negative.   Eyes: Negative.   Respiratory: Negative.   Cardiovascular: Negative.   Gastrointestinal: Negative.   Genitourinary: Negative.   Musculoskeletal: Negative.   Skin: Negative.   Neurological: Negative.   Hematological: Negative.   Psychiatric/Behavioral: Negative.        Objective:   Physical Exam  Constitutional: He is oriented to person, place, and time. He appears well-developed and well-nourished.       Moon faced from steroids  HENT:  Head: Normocephalic and atraumatic.  Right Ear: External ear normal.  Left Ear: External ear normal.  Nose: Nose normal.  Mouth/Throat: Oropharynx is clear and moist.  Eyes: Conjunctivae and EOM are normal. Pupils are equal, round, and reactive to light.  Neck: Normal range of motion. Neck supple. No JVD present. No tracheal deviation present. No thyromegaly present.  Cardiovascular: Normal rate, regular rhythm, normal heart sounds and  intact distal pulses.  Exam reveals no gallop and no friction rub.   No murmur heard. Pulmonary/Chest: Effort normal and breath sounds normal. No stridor. No respiratory distress. He has no wheezes. He has no rales. He exhibits no tenderness.       Gynecomastia from steroids  Abdominal: Soft. Bowel sounds are normal. He exhibits no distension and no mass. There is no tenderness. There is no rebound and no guarding.  Genitourinary: Rectum normal, prostate normal and penis normal. Guaiac negative stool. No penile tenderness.  Musculoskeletal: Normal range of motion. He exhibits no edema and no tenderness.  Lymphadenopathy:    He has no cervical adenopathy.  Neurological: He is alert and oriented to person, place, and time. He has normal reflexes. No cranial nerve deficit. He exhibits normal muscle tone.  Skin: Skin is warm and dry. No rash noted. No erythema. No pallor.  Psychiatric: He has a normal mood and affect. His behavior is normal. Judgment and thought content normal.          Assessment & Plan:  Hypertension under good control continue medication.  Diabetes type 1, under good control continue current medication.  History of metastatic prostate cancer.  Follow-up by oncology.  He will check about his vaccinations and let us know

## 2010-12-26 NOTE — Discharge Summary (Signed)
NAMEHENOK, Andrew Nelson              ACCOUNT NO.:  [id number]   MEDICAL RECORD NO.:  [id number]          PATIENT TYPE:  INP   LOCATION:  1419                         FACILITY:  [facility]   PHYSICIAN:  [name], MD      DATE OF BIRTH:  10/07/1950   DATE OF ADMISSION:  03/03/2006  DATE OF DISCHARGE:  03/04/2006                                 DISCHARGE SUMMARY   ADMISSION DIAGNOSIS:  Prostate cancer.   DISCHARGE DIAGNOSIS:  Prostate cancer.   PROCEDURES:  1.  Robotic-assisted laparoscopic radical prostatectomy.  2.  Bilateral pelvic lymphadenectomy.   HISTORY AND PHYSICAL:  For full details, please see admission history and  physical.  Briefly, Mr. Marrow is a 60 year old gentleman with clinical  stage T1c prostate cancer with a PSA of 10.33 and Gleason score of 4 + .5 =  9.  He underwent metastatic evaluation including a CT scan and bone scan,  which were negative for obvious metastatic disease.  After discussing  management options, the patient elected to proceed with surgical therapy.   HOSPITAL COURSE:  On March 03, 2006, the patient was taken to the operating  room and a robotic-assisted laparoscopic radical prostatectomy was  performed.  The patient tolerated this procedure well and postoperatively  was able to be transferred to a regular hospital room following recovery  from anesthesia.  He was able to begin ambulating the evening of surgery and  by postoperative day #1, was able to begin a clear liquid diet.  He was  subsequently transitioned to oral pain medication.  He maintained excellent  urine output with minimal output from his pelvic drain.  His drain was able  to be removed and by the afternoon of postoperative day #1, he had met all  discharge criteria and was able to be discharged home in excellent  condition.   DISPOSITION:  Home.   DISCHARGE MEDICATIONS:  The patient was instructed that he could resume his  regular home medications except any aspirin,  nonsteroidal anti-inflammatory  drugs, or herbal supplements.  He was given a prescription to take Vicodin  as needed for pain.  He is told to take Colace as a stool softener and given  a prescription to begin Cipro 1 day prior to his return visit.   DISCHARGE INSTRUCTIONS:  The patient was instructed to gradually advance his  diet, once passing flatus.  He was encouraged to be ambulatory, but  specifically instructed to refrain from any heavy lifting, strenuous  activity, or driving.  He was instructed on routine Foley catheter care as  well as instructed on the signs and symptoms of wound infection.  He was  told to call should he have any problems.   FOLLOWUP:  An appointment will be made for [name] to follow up in 1  week for removal of his Foley catheter and to go over his surgical pathology  in detail.           ______________________________  [name], MD  Electronically Signed     LB/MEDQ  D:  03/05/2006  T:  03/06/2006  Job:  [id number] 

## 2010-12-26 NOTE — Op Note (Signed)
Andrew Nelson, RAUDENBUSH              ACCOUNT NO.:  [id number]   MEDICAL RECORD NO.:  [id number]          PATIENT TYPE:  INP   LOCATION:  0008                         FACILITY:  [facility]   PHYSICIAN:  [name], MD      DATE OF BIRTH:  Aug 17, 1950   DATE OF PROCEDURE:  03/03/2006  DATE OF DISCHARGE:                                 OPERATIVE REPORT   PREOPERATIVE DIAGNOSIS:  Clinically localized adenocarcinoma of prostate.   POSTOPERATIVE DIAGNOSIS:  Clinically localized adenocarcinoma of prostate.   PROCEDURES:  1.  Robotic assisted laparoscopic radical prostatectomy (non-nerve sparing).  2.  Bilateral pelvic lymphadenectomy.   SURGEON:  Dr. [name].   ASSISTANT:  Dr. Cornelious Bryant.   ANESTHESIA:  General.   COMPLICATIONS:  None.   ESTIMATED BLOOD LOSS:  200 mL.   INTRAVENOUS FLUIDS:  3300 mL of lactated Ringer's.   SPECIMENS:  1.  Prostate and seminal vesicles.  2.  Right pelvic lymph nodes.  3.  Left pelvic lymph nodes.   DRAINS:  1.  A 20-French Coude catheter.  2.  A #19 Blake pelvic drain.   INDICATIONS:  Mr. Callender is a 60 year old gentleman with clinical T1C  prostate cancer with a PSA of 10.33 and Gleason score 4 + 5 = 9.  He  underwent a metastatic evaluation, including a CT scan and bone scan which  were negative for metastatic disease.  After a discussion regarding  management for clinically localized prostate cancer, the patient elected to  proceed with surgical therapy.  Specifically, the higher risk nature of his  prostate cancer was discussed with him in detail, and he did decide to  proceed with a non-nerve sparing procedure.  Potential risks and benefits  were discussed and he consented.   DESCRIPTION OF PROCEDURE:  The patient was taken to the operating room and a  general anesthetic was administered.  He was given preoperative antibiotics,  placed in the dorsal lithotomy position, prepped and draped in the usual  sterile fashion.  Next a  preoperative time-out was performed.  A Foley  catheter was then inserted into the bladder.  A site was then selected 18 cm  from the pubic symphysis and just to the left of the umbilicus for placement  of the camera port.  This was placed using a standard open Hasson technique.  This allowed entry into the peritoneal cavity under direct vision.  A  pneumoperitoneum was established after a 12-mm port was placed.  A 0-degree  lens was then used to inspect the abdomen and there was no evidence of any  intra-abdominal injuries or other abnormalities.  Attention then turned to  placement of the remaining ports.  Bilateral 8-mm robotic ports were placed,  16 cm from the pubic symphysis and 10 cm lateral to the camera port.  An  additional 8-mm port was placed in the far left lateral abdominal wall.  A 5-  mm port was placed between the camera and the right robotic port.  An  additional 12-mm port was placed in the far right lateral abdominal wall for  laparoscopic assistance.  All ports were placed under direct vision and  without difficulty.  The surgical cart was then docked.  With the aid of  hook cautery, the bladder was reflected posteriorly, allowing entry into the  space of Retzius and identification of the endopelvic fascia and prostate.  The endopelvic fascia was then incised from the apex back to the base of the  prostate, and the underlying levator muscle fibers were swept laterally off  the prostate.  This helped isolate the dorsal venous complex which was then  stapled and divided with a 45-mm flex ETS stapler.  The bladder neck was  then identified with the aid of Foley catheter manipulation.  The anterior  bladder neck was then incised, exposing the Foley catheter.  The Foley  catheter balloon was deflated, and the catheter was brought into the  operative field and used to retract the prostate anteriorly.  This exposed  the posterior bladder neck which was then incised and  dissection continued  posteriorly until the vasa differentia and seminal vesicles were identified.  The vasa differentia were isolated and divided.  Seminal vesicles were  isolated and then lifted anteriorly.  The space between Denonvilliers''s  fascia and the anterior rectum was then bluntly developed, thereby isolating  the vascular pedicles of the prostate.  The vascular pedicles of the  prostate were then ligated with Hem-o-lok clips and divided.  A wide non-  nerve sparing procedure was performed on each side.  The urethra was then  identified and sharply divided, allowing the prostate specimen to be  disarticulated and placed up into the abdomen for later removal.  The pelvis  was then copiously irrigated.  Hemostasis appeared excellent.  With  irrigation in the pelvis, air was injected into the rectal catheter and  there was no evidence of a rectal injury.  Attention then turned to the  right pelvic sidewall.  The fibrofatty tissue between the external iliac  vein, confluence of the iliac vessels, obturator nerve, and Cooper's  ligament was dissected free from the pelvic sidewall with Hem-o-lok used for  hemostasis and lymphostasis.  The specimen was then passed off for permanent  pathologic analysis.  An identical procedure was then performed on the  contralateral side.  Attention then turned to the urethral anastomosis.  A  zero PDS buttressing suture was placed.  A double-armed 3-0 Monocryl suture  was then used to perform a 360 degree running tension-free anastomosis  between the bladder neck and urethra.  A new 20-French Coude catheter was  inserted into the bladder and irrigated.  There no blood clots within the  bladder and the anastomosis appeared watertight.  A #19 Blake drain was then  brought to the left robotic port and appropriate position in the pelvis.  It  was secured to the skin with a nylon suture.  Surgical cart was then undocked.  A zero Vicryl stitch was placed  through the right lateral 12-mm  port site for port site closure with the aid of the suture passer device.  The prostate specimen was then placed into the Endopouch retrieval bag and  retrieved via the 12-mm port site intact.  This fascial opening was then  closed with a running zero Vicryl suture.  All port sites were infiltrated  with 0.25% Marcaine and reapproximated at the skin level with staples.  The  patient tolerated the procedure well and without complications.  He was able  to be extubated and transferred to the recovery unit in satisfactory  condition.  All sponge and needle counts were correct x2 at the end of  procedure.           ______________________________  [name], MD  Electronically Signed     LB/MEDQ  D:  03/03/2006  T:  03/03/2006  Job:  [id number]

## 2010-12-26 NOTE — H&P (Signed)
NAMEASCENCION, COYE              ACCOUNT NO.:  [id number]   MEDICAL RECORD NO.:  [id number]          PATIENT TYPE:  INP   LOCATION:  0008                         FACILITY:  [facility]   PHYSICIAN:  [name], MD      DATE OF BIRTH:  1951-04-08   DATE OF ADMISSION:  03/03/2006  DATE OF DISCHARGE:                                HISTORY & PHYSICAL   CHIEF COMPLAINT:  Prostate cancer.   HISTORY:  Mr. Brayman is a 60 year old gentleman with clinical stage T1c  prostate cancer with a PSA of 10.33 and Gleason's score of 4 + 5 = 9 in 20%  of the right-sided biopsy specimens.  He underwent a metastatic evaluation,  including a CT scan, bone scan and ProstaScint scan, which did not  demonstrate any evidence of metastases.  After discussing options for  clinically localized prostate cancer, the patient elected to proceed with  surgical therapy.   PAST MEDICAL HISTORY:  Hypertension.   PAST SURGICAL HISTORY:  None.   MEDICATIONS:  1.  Cardura.  2.  Hydrochlorothiazide.  3.  Aspirin.  4.  Fish oil.   ALLERGIES:  NO KNOWN DRUG ALLERGIES.   FAMILY HISTORY:  The patient's father died of prostate cancer at age 73.  His mother died of myocardial infarction at age 45.  There is also history  of hypertension, diabetes and kidney stones in the family.   SOCIAL HISTORY:  The patient smoked cigarette for 10 years and has quit  several times.  He drinks a beer once or twice per month.   PHYSICAL EXAMINATION:  CONSTITUTIONAL:  The patient is well-nourished, well-  developed, age-appropriate male in no acute distress.  CARDIOVASCULAR:  Regular rate and rhythm without obvious murmurs.  LUNGS:  Clear bilaterally.  ABDOMEN:  Soft, nontender and nondistended without abdominal masses.  DRE:  No nodularity or induration.   IMPRESSION:  High risk clinically localized prostate cancer.   PLAN:  Mr. Giovanelli will undergo a robotic assisted laparoscopic radical  prostatectomy and bilateral pelvic  lymphadenectomy.  He will then be  admitted to the hospital for routine postoperative care.           ______________________________  [name], MD  Electronically Signed     LB/MEDQ  D:  03/03/2006  T:  03/03/2006  Job:  [id number]

## 2011-02-04 ENCOUNTER — Other Ambulatory Visit: Payer: Self-pay | Admitting: Oncology

## 2011-02-04 ENCOUNTER — Encounter (HOSPITAL_BASED_OUTPATIENT_CLINIC_OR_DEPARTMENT_OTHER): Payer: BC Managed Care – PPO | Admitting: Oncology

## 2011-02-04 DIAGNOSIS — D649 Anemia, unspecified: Secondary | ICD-10-CM

## 2011-02-04 DIAGNOSIS — C7951 Secondary malignant neoplasm of bone: Secondary | ICD-10-CM

## 2011-02-04 DIAGNOSIS — C61 Malignant neoplasm of prostate: Secondary | ICD-10-CM

## 2011-02-04 DIAGNOSIS — C7952 Secondary malignant neoplasm of bone marrow: Secondary | ICD-10-CM

## 2011-02-04 LAB — CBC WITH DIFFERENTIAL/PLATELET
Eosinophils Absolute: 0.1 10*3/uL (ref 0.0–0.5)
LYMPH%: 18.6 % (ref 14.0–49.0)
MONO#: 0 10*3/uL — ABNORMAL LOW (ref 0.1–0.9)
NEUT#: 4.3 10*3/uL (ref 1.5–6.5)
Platelets: 305 10*3/uL (ref 140–400)
RBC: 3.86 10*6/uL — ABNORMAL LOW (ref 4.20–5.82)
RDW: 16.7 % — ABNORMAL HIGH (ref 11.0–14.6)
WBC: 5.4 10*3/uL (ref 4.0–10.3)
lymph#: 1 10*3/uL (ref 0.9–3.3)

## 2011-02-05 LAB — COMPREHENSIVE METABOLIC PANEL
Albumin: 4 g/dL (ref 3.5–5.2)
CO2: 23 mEq/L (ref 19–32)
Calcium: 9.6 mg/dL (ref 8.4–10.5)
Chloride: 104 mEq/L (ref 96–112)
Glucose, Bld: 161 mg/dL — ABNORMAL HIGH (ref 70–99)
Potassium: 4.1 mEq/L (ref 3.5–5.3)
Sodium: 141 mEq/L (ref 135–145)
Total Protein: 6.7 g/dL (ref 6.0–8.3)

## 2011-02-05 LAB — PSA: PSA: 260.46 ng/mL — ABNORMAL HIGH (ref <=4.00)

## 2011-03-19 ENCOUNTER — Other Ambulatory Visit: Payer: Self-pay | Admitting: Oncology

## 2011-03-19 ENCOUNTER — Encounter (HOSPITAL_BASED_OUTPATIENT_CLINIC_OR_DEPARTMENT_OTHER): Payer: BC Managed Care – PPO | Admitting: Oncology

## 2011-03-19 DIAGNOSIS — D649 Anemia, unspecified: Secondary | ICD-10-CM

## 2011-03-19 DIAGNOSIS — C61 Malignant neoplasm of prostate: Secondary | ICD-10-CM

## 2011-03-19 LAB — CBC WITH DIFFERENTIAL/PLATELET
BASO%: 0.5 % (ref 0.0–2.0)
EOS%: 2 % (ref 0.0–7.0)
MCH: 24 pg — ABNORMAL LOW (ref 27.2–33.4)
MCHC: 30 g/dL — ABNORMAL LOW (ref 32.0–36.0)
MONO#: 0.3 10*3/uL (ref 0.1–0.9)
RBC: 4.04 10*6/uL — ABNORMAL LOW (ref 4.20–5.82)
WBC: 6.1 10*3/uL (ref 4.0–10.3)
lymph#: 1.3 10*3/uL (ref 0.9–3.3)
nRBC: 0 % (ref 0–0)

## 2011-03-19 LAB — COMPREHENSIVE METABOLIC PANEL
ALT: 10 U/L (ref 0–53)
Albumin: 3.9 g/dL (ref 3.5–5.2)
Alkaline Phosphatase: 71 U/L (ref 39–117)
CO2: 23 mEq/L (ref 19–32)
Glucose, Bld: 128 mg/dL — ABNORMAL HIGH (ref 70–99)
Potassium: 3.7 mEq/L (ref 3.5–5.3)
Sodium: 141 mEq/L (ref 135–145)
Total Protein: 6.6 g/dL (ref 6.0–8.3)

## 2011-04-29 ENCOUNTER — Encounter: Payer: Self-pay | Admitting: Family Medicine

## 2011-04-30 ENCOUNTER — Other Ambulatory Visit: Payer: Self-pay | Admitting: Oncology

## 2011-04-30 ENCOUNTER — Encounter (HOSPITAL_BASED_OUTPATIENT_CLINIC_OR_DEPARTMENT_OTHER): Payer: BC Managed Care – PPO | Admitting: Oncology

## 2011-04-30 DIAGNOSIS — D649 Anemia, unspecified: Secondary | ICD-10-CM

## 2011-04-30 DIAGNOSIS — C61 Malignant neoplasm of prostate: Secondary | ICD-10-CM

## 2011-04-30 DIAGNOSIS — Z452 Encounter for adjustment and management of vascular access device: Secondary | ICD-10-CM

## 2011-04-30 LAB — CBC WITH DIFFERENTIAL/PLATELET
Basophils Absolute: 0 10*3/uL (ref 0.0–0.1)
EOS%: 1 % (ref 0.0–7.0)
HCT: 32.7 % — ABNORMAL LOW (ref 38.4–49.9)
HGB: 10.5 g/dL — ABNORMAL LOW (ref 13.0–17.1)
MCH: 25.1 pg — ABNORMAL LOW (ref 27.2–33.4)
MCV: 78.5 fL — ABNORMAL LOW (ref 79.3–98.0)
MONO%: 2.1 % (ref 0.0–14.0)
NEUT%: 80.8 % — ABNORMAL HIGH (ref 39.0–75.0)

## 2011-04-30 LAB — COMPREHENSIVE METABOLIC PANEL
Alkaline Phosphatase: 76 U/L (ref 39–117)
Creatinine, Ser: 0.79 mg/dL (ref 0.50–1.35)
Glucose, Bld: 126 mg/dL — ABNORMAL HIGH (ref 70–99)
Sodium: 142 mEq/L (ref 135–145)
Total Bilirubin: 0.4 mg/dL (ref 0.3–1.2)
Total Protein: 7.3 g/dL (ref 6.0–8.3)

## 2011-04-30 LAB — PSA: PSA: 306.6 ng/mL — ABNORMAL HIGH (ref <=4.00)

## 2011-05-01 ENCOUNTER — Encounter (HOSPITAL_BASED_OUTPATIENT_CLINIC_OR_DEPARTMENT_OTHER): Payer: BC Managed Care – PPO | Admitting: Oncology

## 2011-05-01 DIAGNOSIS — C61 Malignant neoplasm of prostate: Secondary | ICD-10-CM

## 2011-06-11 ENCOUNTER — Encounter (HOSPITAL_BASED_OUTPATIENT_CLINIC_OR_DEPARTMENT_OTHER): Payer: BC Managed Care – PPO | Admitting: Oncology

## 2011-06-11 ENCOUNTER — Other Ambulatory Visit: Payer: Self-pay | Admitting: Medical

## 2011-06-11 ENCOUNTER — Other Ambulatory Visit: Payer: Self-pay | Admitting: Oncology

## 2011-06-11 DIAGNOSIS — R1909 Other intra-abdominal and pelvic swelling, mass and lump: Secondary | ICD-10-CM

## 2011-06-11 DIAGNOSIS — Z452 Encounter for adjustment and management of vascular access device: Secondary | ICD-10-CM

## 2011-06-11 DIAGNOSIS — D649 Anemia, unspecified: Secondary | ICD-10-CM

## 2011-06-11 DIAGNOSIS — C779 Secondary and unspecified malignant neoplasm of lymph node, unspecified: Secondary | ICD-10-CM

## 2011-06-11 DIAGNOSIS — C61 Malignant neoplasm of prostate: Secondary | ICD-10-CM

## 2011-06-11 DIAGNOSIS — C801 Malignant (primary) neoplasm, unspecified: Secondary | ICD-10-CM

## 2011-06-11 LAB — CBC WITH DIFFERENTIAL/PLATELET
EOS%: 1.4 % (ref 0.0–7.0)
Eosinophils Absolute: 0.1 10*3/uL (ref 0.0–0.5)
MCH: 23.6 pg — ABNORMAL LOW (ref 27.2–33.4)
MCV: 77.3 fL — ABNORMAL LOW (ref 79.3–98.0)
MONO%: 5.3 % (ref 0.0–14.0)
NEUT#: 5 10*3/uL (ref 1.5–6.5)
RBC: 4.06 10*6/uL — ABNORMAL LOW (ref 4.20–5.82)
RDW: 15.2 % — ABNORMAL HIGH (ref 11.0–14.6)
nRBC: 0 % (ref 0–0)

## 2011-06-11 LAB — COMPREHENSIVE METABOLIC PANEL
ALT: 13 U/L (ref 0–53)
AST: 12 U/L (ref 0–37)
Alkaline Phosphatase: 73 U/L (ref 39–117)
Sodium: 137 mEq/L (ref 135–145)
Total Bilirubin: 0.5 mg/dL (ref 0.3–1.2)
Total Protein: 6.8 g/dL (ref 6.0–8.3)

## 2011-06-26 ENCOUNTER — Telehealth: Payer: Self-pay | Admitting: Family Medicine

## 2011-06-26 NOTE — Telephone Encounter (Signed)
[facility] needs dx codes from all labs drawn 02/22/11, in order for pts insurance to cover. Pls call.

## 2011-06-30 NOTE — Telephone Encounter (Signed)
Anemia - 280.9, HTN - 401.9, Fatigue - 780.79

## 2011-07-24 ENCOUNTER — Encounter: Payer: Self-pay | Admitting: *Deleted

## 2011-07-24 NOTE — Progress Notes (Signed)
Signed refill request for zytiga faxed to [facility] [phone] fax [phone]

## 2011-08-13 ENCOUNTER — Other Ambulatory Visit (HOSPITAL_BASED_OUTPATIENT_CLINIC_OR_DEPARTMENT_OTHER): Payer: BC Managed Care – PPO | Admitting: Lab

## 2011-08-13 ENCOUNTER — Other Ambulatory Visit: Payer: Self-pay | Admitting: Oncology

## 2011-08-13 ENCOUNTER — Encounter (HOSPITAL_COMMUNITY)
Admission: RE | Admit: 2011-08-13 | Discharge: 2011-08-13 | Disposition: A | Discharge status: Home / Self Care | Payer: BC Managed Care – PPO | Source: Ambulatory Visit | Attending: Oncology | Admitting: Oncology

## 2011-08-13 ENCOUNTER — Ambulatory Visit (HOSPITAL_BASED_OUTPATIENT_CLINIC_OR_DEPARTMENT_OTHER): Payer: BC Managed Care – PPO

## 2011-08-13 ENCOUNTER — Encounter (HOSPITAL_COMMUNITY): Payer: Self-pay

## 2011-08-13 VITALS — BP 129/79 | HR 108 | Temp 97.3°F

## 2011-08-13 DIAGNOSIS — Z469 Encounter for fitting and adjustment of unspecified device: Secondary | ICD-10-CM

## 2011-08-13 DIAGNOSIS — C61 Malignant neoplasm of prostate: Secondary | ICD-10-CM | POA: Insufficient documentation

## 2011-08-13 DIAGNOSIS — D649 Anemia, unspecified: Secondary | ICD-10-CM

## 2011-08-13 LAB — CMP (CANCER CENTER ONLY)
AST: 12 U/L (ref 11–38)
Albumin: 3.5 g/dL (ref 3.3–5.5)
BUN, Bld: 15 mg/dL (ref 7–22)
Calcium: 9.3 mg/dL (ref 8.0–10.3)
Chloride: 103 mEq/L (ref 98–108)
Glucose, Bld: 161 mg/dL — ABNORMAL HIGH (ref 73–118)
Potassium: 3.7 mEq/L (ref 3.3–4.7)

## 2011-08-13 LAB — CBC WITH DIFFERENTIAL/PLATELET
Basophils Absolute: 0 10*3/uL (ref 0.0–0.1)
Eosinophils Absolute: 0.1 10*3/uL (ref 0.0–0.5)
HGB: 10.3 g/dL — ABNORMAL LOW (ref 13.0–17.1)
NEUT#: 3.8 10*3/uL (ref 1.5–6.5)
RDW: 16.5 % — ABNORMAL HIGH (ref 11.0–14.6)
lymph#: 1.3 10*3/uL (ref 0.9–3.3)

## 2011-08-13 LAB — PSA: PSA: 235.1 ng/mL — ABNORMAL HIGH (ref <=4.00)

## 2011-08-13 MED ORDER — SODIUM CHLORIDE 0.9 % IJ SOLN
10.0000 mL | INTRAMUSCULAR | Status: DC | PRN
  Filled 2011-08-13: qty 10

## 2011-08-13 MED ORDER — TECHNETIUM TC 99M MEDRONATE IV KIT
24.0000 | PACK | Freq: Once | INTRAVENOUS | Status: AC | PRN
  Administered 2011-08-13: 24 via INTRAVENOUS

## 2011-08-13 MED ORDER — HEPARIN SOD (PORK) LOCK FLUSH 100 UNIT/ML IV SOLN
500.0000 [IU] | Freq: Once | INTRAVENOUS | Status: DC
  Filled 2011-08-13: qty 5

## 2011-08-13 MED ORDER — HEPARIN SOD (PORK) LOCK FLUSH 100 UNIT/ML IV SOLN
500.0000 [IU] | Freq: Once | INTRAVENOUS | Status: AC
  Administered 2011-08-13: 500 [IU] via INTRAVENOUS
  Filled 2011-08-13: qty 5

## 2011-08-13 MED ORDER — IOHEXOL 300 MG/ML  SOLN
100.0000 mL | Freq: Once | INTRAMUSCULAR | Status: AC | PRN
  Administered 2011-08-13: 100 mL via INTRAVENOUS

## 2011-08-13 MED ORDER — SODIUM CHLORIDE 0.9 % IJ SOLN
10.0000 mL | INTRAMUSCULAR | Status: DC | PRN
  Administered 2011-08-13: 10 mL via INTRAVENOUS
  Filled 2011-08-13: qty 10

## 2011-08-13 NOTE — Patient Instructions (Signed)
Call MD for problems 

## 2011-08-13 NOTE — Progress Notes (Signed)
Accessedport and lab draw and left accessed (power port) for CT scan

## 2011-08-20 ENCOUNTER — Ambulatory Visit (HOSPITAL_BASED_OUTPATIENT_CLINIC_OR_DEPARTMENT_OTHER): Payer: BC Managed Care – PPO | Admitting: Oncology

## 2011-08-20 VITALS — BP 143/84 | HR 101 | Temp 97.1°F | Ht 67.5 in | Wt 198.7 lb

## 2011-08-20 DIAGNOSIS — C779 Secondary and unspecified malignant neoplasm of lymph node, unspecified: Secondary | ICD-10-CM

## 2011-08-20 DIAGNOSIS — D649 Anemia, unspecified: Secondary | ICD-10-CM

## 2011-08-20 DIAGNOSIS — C61 Malignant neoplasm of prostate: Secondary | ICD-10-CM

## 2011-08-20 NOTE — Progress Notes (Signed)
Hematology and Oncology Follow Up Visit  QUINTON VOTH [id number] Nov 01, 1950 61 y.o. 08/20/2011 10:59 AM  CC:  [name], MD  [name], MD  [name], M.D.    Principle Diagnosis: :  This is a 61 year old gentleman with prostate cancer initially diagnosed in 2007.  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    Prior Therapy: 1. Status post laparoscopic prostatectomy and bilateral lymphadenectomy, postoperative PSA was 0.5. 2. He received salvage radiation therapy due to persistent elevation in his PSA. 3. Patient treated with Lupron and Casodex due to a rise in his PSA, subsequently treated with Casodex withdrawal. 4. 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 5. Patient treated with 10 cycles of Taxotere and prednisone.  Taxotere was reduced to 60 mg/sq m; last dose given was in October 2011.  However, PSA started to rise up to 250.  Current therapy: He is on Zytiga 1000 mg daily with prednisone 5 mg daily since October 2011.  His PSA was as high as 315, down to 264 and has been relatively stable. And most recently PSA down to 235.   Interim History:  Mr. Beneke presents today for a followup visit.  He has continued to tolerate Zytiga very well without any major toxicity.  He actually is enjoying the best quality of life and best performance status that he has enjoyed for the last year or so.  He had not had any abdominal pain, had not had any discomfort, had not had any fluid retention at this time.  Had not had any major changes in his performance status or activity level.  As mentioned, he does not report any musculoskeletal pain, had not reported any arthralgias or myalgias.  Had not had any major changes in his urine flow at this time. No new symptoms at this time.  Medications: I  have reviewed the patient's current medications. Current outpatient prescriptions:Abiraterone Acetate (ZYTIGA PO), Take by mouth.  , Disp: , Rfl: ;  calcium-vitamin D 250-100 MG-UNIT per tablet, Take 1 tablet by mouth daily.  , Disp: , Rfl: ;  doxazosin (CARDURA) 8 MG tablet, Take 1 tablet (8 mg total) by mouth daily., Disp: 100 tablet, Rfl: 3;  fish oil-omega-3 fatty acids 1000 MG capsule, Take 1 g by mouth daily.  , Disp: , Rfl:  glucose blood (ACCU-CHEK ACTIVE STRIPS) test strip, Use as instructed, Disp: 100 each, Rfl: 3;  hydrochlorothiazide 25 MG tablet, Take 1 tablet (25 mg total) by mouth daily., Disp: 100 tablet, Rfl: 3;  insulin aspart protamine-insulin aspart (NOVOLOG 70/30) (70-30) 100 UNIT/ML injection, Inject 20 Units into the skin daily., Disp: 10 mL, Rfl: 6 leuprolide (LUPRON) 30 MG injection, Inject 30 mg into the muscle every 4 (four) months.  , Disp: , Rfl: ;  lidocaine-prilocaine (EMLA) cream, Apply topically as needed.  , Disp: , Rfl: ;  lisinopril (ZESTRIL) 10 MG tablet, Take 1 tablet (10 mg total) by mouth daily., Disp: 100 tablet, Rfl: 3;  metFORMIN (GLUCOPHAGE) 850 MG tablet, Take 1 tablet (850 mg total) by mouth 2 (two) times daily with a meal., Disp: 200 tablet, Rfl: 3 predniSONE (DELTASONE) 5 MG tablet, Take 5 mg by mouth 2 (two) times daily.  , Disp: , Rfl: ;  senna (SENOKOT) 8.6 MG tablet, Take 1 tablet by mouth daily.  , Disp: , Rfl:  Allergies:  Allergies  Allergen Reactions  . Aprindine   . Bc Powder (Aspirin-Salicylamide-Caffeine)   . Penicillins     Past Medical History, Surgical history, Social history, and Family History were reviewed and updated.  Review of Systems: Constitutional:  Negative for fever, chills, night sweats, anorexia, weight loss, pain. Cardiovascular: no chest pain or dyspnea on exertion Respiratory: no cough, shortness of breath, or wheezing Neurological: negative Dermatological: negative ENT: negative Skin: Negative. Gastrointestinal: no  abdominal pain, change in bowel habits, or black or bloody stools Genito-Urinary: no dysuria, trouble voiding, or hematuria Hematological and Lymphatic: negative Breast: negative Musculoskeletal: negative Remaining ROS negative. Physical Exam: Blood pressure 143/84, pulse 101, temperature 97.1 F (36.2 C), temperature source Oral, height 5' 7.5" (1.715 m), weight 198 lb 11.2 oz (90.13 kg). ECOG: 1 General appearance: alert Head: Normocephalic, without obvious abnormality, atraumatic Neck: no adenopathy, no carotid bruit, no JVD, supple, symmetrical, trachea midline and thyroid not enlarged, symmetric, no tenderness/mass/nodules Lymph nodes: Cervical, supraclavicular, and axillary nodes normal. Heart:regular rate and rhythm, S1, S2 normal, no murmur, click, rub or gallop Lung:chest clear, no wheezing, rales, normal symmetric air entry Abdomin: soft, non-tender, without masses or organomegaly EXT:no erythema, induration, or nodules   Lab Results: Lab Results  Component Value Date   WBC 5.5 08/13/2011   HGB 10.3* 08/13/2011   HCT 32.0* 08/13/2011   MCV 77.8* 08/13/2011   PLT 232 08/13/2011     Chemistry      Component Value Date/Time   NA 141 08/13/2011 1129   NA 137 06/11/2011 1038   NA 137 06/11/2011 1038   K 3.7 08/13/2011 1129   K 3.9 06/11/2011 1038   K 3.9 06/11/2011 1038   CL 103 08/13/2011 1129   CL 100 06/11/2011 1038   CL 100 06/11/2011 1038   CO2 28 08/13/2011 1129   CO2 23 06/11/2011 1038   CO2 23 06/11/2011 1038   BUN 15 08/13/2011 1129   BUN 17 06/11/2011 1038   BUN 17 06/11/2011 1038   CREATININE 0.5* 08/13/2011 1129   CREATININE 0.78 06/11/2011 1038   CREATININE 0.78 06/11/2011 1038      Component Value Date/Time   CALCIUM 9.3 08/13/2011 1129   CALCIUM 9.5 06/11/2011 1038   CALCIUM 9.5 06/11/2011 1038   ALKPHOS 76 08/13/2011 1129   ALKPHOS 73 06/11/2011 1038   ALKPHOS 73 06/11/2011 1038   AST 12 08/13/2011 1129   AST 12 06/11/2011 1038   AST 12 06/11/2011 1038   ALT 13 06/11/2011 1038    ALT 13 06/11/2011 1038   BILITOT 0.50 08/13/2011 1129   BILITOT 0.5 06/11/2011 1038   BILITOT 0.5 06/11/2011 1038     CT CHEST, ABDOMEN AND PELVIS WITH CONTRAST 08/13/2011 Technique: Multidetector CT imaging of the chest, abdomen and  pelvis was performed following the standard protocol during bolus  administration of intravenous contrast.  Contrast: 100 ml Omnipaque-300 IV  Comparison: 12/11/2010  CT CHEST  Findings: No suspicious pulmonary nodules. No pleural effusion or  pneumothorax.  Visualized thyroid is unremarkable.  The heart is normal in size. No pericardial effusion.  Small mediastinal lymph nodes which do not meet pathologic CT size  criteria. No suspicious hilar or axillary lymphadenopathy.  Right chest port.  Gynecomastia.  Mild degenerative changes of the thoracic spine. No focal osseous  lesions.  IMPRESSION:  No evidence of metastatic disease in the chest.  CT ABDOMEN AND PELVIS  Findings: Focal fat along the falciform ligament. Tiny hepatic  cyst. No  suspicious hepatic lesions.  Spleen, pancreas, and adrenal glands are within normal limits.  Gallbladder is unremarkable. No intrahepatic or extrahepatic  ductal dilatation.  Kidneys are within normal limits. No hydronephrosis.  No evidence of bowel obstruction.  No evidence of abdominal aortic aneurysm.  No abdominopelvic ascites.  Presacral soft tissue mass now measures approximately 5.3 x 6.3 x  6.2 cm, increased, with right anterior/inferior extension of tumor  relative to the prior study (series 2/image 108).  Small bilateral iliac chain lymph nodes measuring up to 7 mm short  axis.  Bladder is thick-walled.  Degenerative changes of the lumbar spine.  No focal osseous lesions.  IMPRESSION:  6.3 cm presacral soft tissue mass, increased.  Stable small bilateral iliac chain lymph nodes measuring up to 7  mm.  Bone Scan 08/13/2011 IMPRESSION:  No definite abnormal osseous tracer localization identified to    suggest osseous metastatic disease.  Question minimal residual tracer within the right side power port  as discussed above.     Impression and Plan:  This is a pleasant 61 year old gentleman with the following issues: 1. 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  He is currently on Zytiga and prednisone.  Again, his PSA had been relatively stable. Imaging studies showed no progression of disease that requires change in his treatment drug. I will continue his Zytiga for now. 2. Port-A-Cath management.  Continue Port-A-Cath flush every 6-8 weeks.   3. Anemia that is multifactorial. His Hgb is stable. 4. Androgen deprivation.  He will receive Lupron probably in February with the next visit.    Marky Buresh, MD 1/10/201310:59 AM

## 2011-09-15 ENCOUNTER — Other Ambulatory Visit: Payer: Self-pay | Admitting: *Deleted

## 2011-09-15 DIAGNOSIS — C61 Malignant neoplasm of prostate: Secondary | ICD-10-CM

## 2011-09-15 MED ORDER — ABIRATERONE ACETATE 250 MG PO TABS: 1000.0000 mg | ORAL_TABLET | Freq: Every day | ORAL | Status: DC

## 2011-09-15 NOTE — Telephone Encounter (Signed)
Addended by: Arvilla Meres on: 09/15/2011 04:27 PM   Modules accepted: Orders

## 2011-09-15 NOTE — Telephone Encounter (Signed)
THIS REQUEST WAS GIVEN TO DR.SHADAD'S NURSE, DESIREE HILL,RN.

## 2011-09-23 ENCOUNTER — Other Ambulatory Visit: Payer: Self-pay | Admitting: Oncology

## 2011-09-23 ENCOUNTER — Other Ambulatory Visit: Payer: Self-pay | Admitting: Family Medicine

## 2011-09-23 DIAGNOSIS — Z8546 Personal history of malignant neoplasm of prostate: Secondary | ICD-10-CM

## 2011-09-23 DIAGNOSIS — D539 Nutritional anemia, unspecified: Secondary | ICD-10-CM

## 2011-09-23 DIAGNOSIS — C61 Malignant neoplasm of prostate: Secondary | ICD-10-CM

## 2011-10-08 ENCOUNTER — Ambulatory Visit: Payer: BC Managed Care – PPO

## 2011-10-08 ENCOUNTER — Other Ambulatory Visit (HOSPITAL_BASED_OUTPATIENT_CLINIC_OR_DEPARTMENT_OTHER): Payer: BC Managed Care – PPO | Admitting: Lab

## 2011-10-08 ENCOUNTER — Telehealth: Payer: Self-pay | Admitting: Oncology

## 2011-10-08 ENCOUNTER — Ambulatory Visit (HOSPITAL_BASED_OUTPATIENT_CLINIC_OR_DEPARTMENT_OTHER): Payer: BC Managed Care – PPO | Admitting: Oncology

## 2011-10-08 VITALS — BP 136/79 | HR 102 | Temp 98.3°F | Ht 67.5 in | Wt 200.8 lb

## 2011-10-08 DIAGNOSIS — D126 Benign neoplasm of colon, unspecified: Secondary | ICD-10-CM

## 2011-10-08 DIAGNOSIS — I10 Essential (primary) hypertension: Secondary | ICD-10-CM

## 2011-10-08 DIAGNOSIS — E109 Type 1 diabetes mellitus without complications: Secondary | ICD-10-CM

## 2011-10-08 DIAGNOSIS — R5381 Other malaise: Secondary | ICD-10-CM

## 2011-10-08 DIAGNOSIS — F17200 Nicotine dependence, unspecified, uncomplicated: Secondary | ICD-10-CM

## 2011-10-08 DIAGNOSIS — C61 Malignant neoplasm of prostate: Secondary | ICD-10-CM

## 2011-10-08 DIAGNOSIS — Z86010 Personal history of colonic polyps: Secondary | ICD-10-CM

## 2011-10-08 DIAGNOSIS — D509 Iron deficiency anemia, unspecified: Secondary | ICD-10-CM

## 2011-10-08 DIAGNOSIS — Z8546 Personal history of malignant neoplasm of prostate: Secondary | ICD-10-CM

## 2011-10-08 DIAGNOSIS — D649 Anemia, unspecified: Secondary | ICD-10-CM

## 2011-10-08 LAB — TESTOSTERONE: Testosterone: <10 ng/dL — ABNORMAL LOW (ref 250–890)

## 2011-10-08 LAB — CBC WITH DIFFERENTIAL/PLATELET
BASO%: 0.6 % (ref 0.0–2.0)
Basophils Absolute: 0 10*3/uL (ref 0.0–0.1)
EOS%: 2.3 % (ref 0.0–7.0)
HCT: 32.5 % — ABNORMAL LOW (ref 38.4–49.9)
HGB: 10 g/dL — ABNORMAL LOW (ref 13.0–17.1)
LYMPH%: 27.4 % (ref 14.0–49.0)
MCH: 23.8 pg — ABNORMAL LOW (ref 27.2–33.4)
MCHC: 30.8 g/dL — ABNORMAL LOW (ref 32.0–36.0)
MCV: 77.4 fL — ABNORMAL LOW (ref 79.3–98.0)
MONO%: 7.3 % (ref 0.0–14.0)
NEUT%: 62.4 % (ref 39.0–75.0)
lymph#: 1.9 10*3/uL (ref 0.9–3.3)

## 2011-10-08 LAB — COMPREHENSIVE METABOLIC PANEL
AST: 14 U/L (ref 0–37)
Albumin: 4 g/dL (ref 3.5–5.2)
Alkaline Phosphatase: 73 U/L (ref 39–117)
BUN: 15 mg/dL (ref 6–23)
Creatinine, Ser: 0.74 mg/dL (ref 0.50–1.35)
Glucose, Bld: 113 mg/dL — ABNORMAL HIGH (ref 70–99)
Potassium: 3.5 mEq/L (ref 3.5–5.3)
Total Bilirubin: 0.4 mg/dL (ref 0.3–1.2)

## 2011-10-08 MED ORDER — HEPARIN SOD (PORK) LOCK FLUSH 100 UNIT/ML IV SOLN
500.0000 [IU] | Freq: Once | INTRAVENOUS | Status: AC
  Administered 2011-10-08: 500 [IU] via INTRAVENOUS
  Filled 2011-10-08: qty 5

## 2011-10-08 MED ORDER — SODIUM CHLORIDE 0.9 % IJ SOLN
10.0000 mL | INTRAMUSCULAR | Status: DC | PRN
  Administered 2011-10-08: 10 mL via INTRAVENOUS
  Filled 2011-10-08: qty 10

## 2011-10-08 MED ORDER — LEUPROLIDE ACETATE (4 MONTH) 30 MG IM KIT
30.0000 mg | PACK | Freq: Once | INTRAMUSCULAR | Status: AC
  Administered 2011-10-08: 30 mg via INTRAMUSCULAR

## 2011-10-08 NOTE — Progress Notes (Signed)
Addended by: Reesa Chew on: 10/08/2011 09:26 AM   Modules accepted: Orders

## 2011-10-08 NOTE — Telephone Encounter (Signed)
appts made and printed for 5/2   aom

## 2011-10-08 NOTE — Progress Notes (Signed)
Hematology and Oncology Follow Up Visit  Andrew Nelson [id number] December 01, 1950 61 y.o. 10/08/2011 9:21 AM  CC:  Andrew Purpura, MD  Andrew Nelson. Andrew Cooler, MD  Andrew Nelson, M.D.    Principle Diagnosis: :  This is a 61 year old gentleman with prostate cancer initially diagnosed in 2007.  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    Prior Therapy: 1. Status post laparoscopic prostatectomy and bilateral lymphadenectomy, postoperative PSA was 0.5. 2. He received salvage radiation therapy due to persistent elevation in his PSA. 3. Patient treated with Lupron and Casodex due to a rise in his PSA, subsequently treated with Casodex withdrawal. 4. 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 5. Patient treated with 10 cycles of Taxotere and prednisone.  Taxotere was reduced to 60 mg/sq m; last dose given was in October 2011.  However, PSA started to rise up to 250.  Current therapy: He is on Zytiga 1000 mg daily with prednisone 5 mg daily since October 2011.  His PSA was as high as 315, down to 264 and has been relatively stable. And most recently PSA down to 235.   Interim History:  Andrew Nelson presents today for a followup visit.  He has continued to tolerate Zytiga very well without any major toxicity.  He actually is enjoying the best quality of life and best performance status that he has enjoyed for the last year or so.  He had not had any abdominal pain, had not had any discomfort, had not had any fluid retention at this time.  Had not had any major changes in his performance status or activity level.  As mentioned, he does not report any musculoskeletal pain, had not reported any arthralgias or myalgias.  Had not had any major changes in his urine flow at this time. No new symptoms at this time. Hr continues to be  active since the last visit.   Medications: I have reviewed the patient's current medications. Current outpatient prescriptions:abiraterone Acetate (ZYTIGA) 250 MG tablet, Take 4 tablets (1,000 mg total) by mouth daily. Take on an empty stomach 1 hour before or 2 hours after a meal, Disp: 120 tablet, Rfl: 2;  calcium-vitamin D 250-100 MG-UNIT per tablet, Take 1 tablet by mouth daily.  , Disp: , Rfl: ;  [employer] SENNA 8.6 MG tablet, TAKE 1 TABLET BY MOUTH EVERY DAY, Disp: 90 tablet, Rfl: 0 doxazosin (CARDURA) 8 MG tablet, Take 1 tablet (8 mg total) by mouth daily., Disp: 100 tablet, Rfl: 3;  fish oil-omega-3 fatty acids 1000 MG capsule, Take 1 g by mouth daily.  , Disp: , Rfl: ;  glucose blood (ACCU-CHEK ACTIVE STRIPS) test strip, Use as instructed, Disp: 100 each, Rfl: 3;  hydrochlorothiazide 25 MG tablet, Take 1 tablet (25 mg total) by mouth daily., Disp: 100 tablet, Rfl: 3 leuprolide (LUPRON) 30 MG injection, Inject 30 mg into the muscle every 4 (four) months.  , Disp: , Rfl: ;  lidocaine-prilocaine (EMLA) cream, Apply topically as needed.  , Disp: , Rfl: ;  lisinopril (ZESTRIL) 10 MG tablet, Take 1 tablet (10 mg total) by mouth daily., Disp: 100 tablet, Rfl: 3;  metFORMIN (GLUCOPHAGE) 850 MG tablet, Take 1 tablet (850 mg total) by mouth 2 (two) times daily with a meal., Disp: 200 tablet, Rfl: 3 NOVOLOG MIX 70/30 (70-30) 100 UNIT/ML injection, INJECT 20 UNITS EVERY DAY,  Disp: 20 mL, Rfl: 3;  predniSONE (DELTASONE) 5 MG tablet, TAKE 1 TABLET BY MOUTH EVERY DAY, Disp: 90 tablet, Rfl: 0 No current facility-administered medications for this visit. Facility-Administered Medications Ordered in Other Visits: heparin lock flush 100 unit/mL, 500 Units, Intravenous, Once, Eli Hose, MD, 500 Units at 10/08/11 0850;  sodium chloride 0.9 % injection 10 mL, 10 mL, Intravenous, PRN, Eli Hose, MD, 10 mL at 10/08/11 0850  Allergies:  Allergies  Allergen Reactions  . Aprindine   . Bc Powder  (Aspirin-Salicylamide-Caffeine)   . Penicillins     Past Medical History, Surgical history, Social history, and Family History were reviewed and updated.  Review of Systems: Constitutional:  Negative for fever, chills, night sweats, anorexia, weight loss, pain. Cardiovascular: no chest pain or dyspnea on exertion Respiratory: no cough, shortness of breath, or wheezing Neurological: negative Dermatological: negative ENT: negative Skin: Negative. Gastrointestinal: no abdominal pain, change in bowel habits, or black or bloody stools Genito-Urinary: no dysuria, trouble voiding, or hematuria Hematological and Lymphatic: negative Breast: negative Musculoskeletal: negative Remaining ROS negative. Physical Exam: Blood pressure 136/79, pulse 102, temperature 98.3 F (36.8 C), temperature source Oral, height 5' 7.5" (1.715 m), weight 200 lb 12.8 oz (91.082 kg). ECOG: 1 General appearance: alert Head: Normocephalic, without obvious abnormality, atraumatic Neck: no adenopathy, no carotid bruit, no JVD, supple, symmetrical, trachea midline and thyroid not enlarged, symmetric, no tenderness/mass/nodules Lymph nodes: Cervical, supraclavicular, and axillary nodes normal. Heart:regular rate and rhythm, S1, S2 normal, no murmur, click, rub or gallop Lung:chest clear, no wheezing, rales, normal symmetric air entry Abdomin: soft, non-tender, without masses or organomegaly EXT:no erythema, induration, or nodules   Lab Results: Lab Results  Component Value Date   WBC 7.0 10/08/2011   HGB 10.0* 10/08/2011   HCT 32.5* 10/08/2011   MCV 77.4* 10/08/2011   PLT 270 10/08/2011     Chemistry      Component Value Date/Time   NA 141 08/13/2011 1129   NA 137 06/11/2011 1038   NA 137 06/11/2011 1038   K 3.7 08/13/2011 1129   K 3.9 06/11/2011 1038   K 3.9 06/11/2011 1038   CL 103 08/13/2011 1129   CL 100 06/11/2011 1038   CL 100 06/11/2011 1038   CO2 28 08/13/2011 1129   CO2 23 06/11/2011 1038   CO2 23 06/11/2011  1038   BUN 15 08/13/2011 1129   BUN 17 06/11/2011 1038   BUN 17 06/11/2011 1038   CREATININE 0.5* 08/13/2011 1129   CREATININE 0.78 06/11/2011 1038   CREATININE 0.78 06/11/2011 1038      Component Value Date/Time   CALCIUM 9.3 08/13/2011 1129   CALCIUM 9.5 06/11/2011 1038   CALCIUM 9.5 06/11/2011 1038   ALKPHOS 76 08/13/2011 1129   ALKPHOS 73 06/11/2011 1038   ALKPHOS 73 06/11/2011 1038   AST 12 08/13/2011 1129   AST 12 06/11/2011 1038   AST 12 06/11/2011 1038   ALT 13 06/11/2011 1038   ALT 13 06/11/2011 1038   BILITOT 0.50 08/13/2011 1129   BILITOT 0.5 06/11/2011 1038   BILITOT 0.5 06/11/2011 1038    Results for Alles, Emrik L (MRN [id number]) as of 10/08/2011 09:22  Ref. Range 06/11/2011 10:38 08/13/2011 11:29  PSA Latest Range: <=4.00 ng/mL 258.80 (H) 235.10 (H)    Impression and Plan:  This is a pleasant 61 year old gentleman with the following issues: 1. 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  He is currently on Zytiga  and prednisone.  Again, his PSA had been relatively stable. Imaging studies showed no progression of disease that requires change in his treatment drug. I will continue his Zytiga for now. 2. Port-A-Cath management.  Continue Port-A-Cath flush every 6-8 weeks.   3. Anemia that is multifactorial. His Hgb is stable. 4. Androgen deprivation.  He will receive Lupron probably today and every four months.   [facility], MD 2/28/20139:21 AM

## 2011-11-12 NOTE — Progress Notes (Signed)
Received fax from [facility] stating that pt's Roosvelt Maser will be shipped for delivery on 11/13/11.

## 2011-11-30 ENCOUNTER — Other Ambulatory Visit: Payer: Self-pay | Admitting: Oncology

## 2011-11-30 ENCOUNTER — Telehealth: Payer: Self-pay | Admitting: Oncology

## 2011-11-30 NOTE — Telephone Encounter (Signed)
called and scheduled appt for 04/23

## 2011-12-01 ENCOUNTER — Ambulatory Visit: Payer: BC Managed Care – PPO

## 2011-12-01 ENCOUNTER — Telehealth: Payer: Self-pay | Admitting: Oncology

## 2011-12-01 ENCOUNTER — Encounter: Payer: Self-pay | Admitting: Oncology

## 2011-12-01 ENCOUNTER — Ambulatory Visit (HOSPITAL_BASED_OUTPATIENT_CLINIC_OR_DEPARTMENT_OTHER): Payer: BC Managed Care – PPO

## 2011-12-01 ENCOUNTER — Ambulatory Visit (HOSPITAL_BASED_OUTPATIENT_CLINIC_OR_DEPARTMENT_OTHER): Payer: BC Managed Care – PPO | Admitting: Oncology

## 2011-12-01 VITALS — BP 147/85 | HR 102 | Temp 98.6°F | Ht 67.5 in | Wt 202.2 lb

## 2011-12-01 DIAGNOSIS — C61 Malignant neoplasm of prostate: Secondary | ICD-10-CM

## 2011-12-01 DIAGNOSIS — D649 Anemia, unspecified: Secondary | ICD-10-CM

## 2011-12-01 DIAGNOSIS — C775 Secondary and unspecified malignant neoplasm of intrapelvic lymph nodes: Secondary | ICD-10-CM

## 2011-12-01 DIAGNOSIS — Z8546 Personal history of malignant neoplasm of prostate: Secondary | ICD-10-CM

## 2011-12-01 DIAGNOSIS — R109 Unspecified abdominal pain: Secondary | ICD-10-CM

## 2011-12-01 LAB — CBC WITH DIFFERENTIAL/PLATELET
BASO%: 0.8 % (ref 0.0–2.0)
Eosinophils Absolute: 0.1 10*3/uL (ref 0.0–0.5)
HCT: 31.5 % — ABNORMAL LOW (ref 38.4–49.9)
LYMPH%: 20.7 % (ref 14.0–49.0)
MCHC: 31 g/dL — ABNORMAL LOW (ref 32.0–36.0)
MONO#: 0.6 10*3/uL (ref 0.1–0.9)
NEUT#: 4.5 10*3/uL (ref 1.5–6.5)
NEUT%: 68 % (ref 39.0–75.0)
Platelets: 266 10*3/uL (ref 140–400)
WBC: 6.7 10*3/uL (ref 4.0–10.3)
lymph#: 1.4 10*3/uL (ref 0.9–3.3)
nRBC: 0 % (ref 0–0)

## 2011-12-01 LAB — COMPREHENSIVE METABOLIC PANEL
ALT: 13 U/L (ref 0–53)
AST: 15 U/L (ref 0–37)
Albumin: 4.3 g/dL (ref 3.5–5.2)
BUN: 11 mg/dL (ref 6–23)
CO2: 24 mEq/L (ref 19–32)
Calcium: 9.8 mg/dL (ref 8.4–10.5)
Chloride: 104 mEq/L (ref 96–112)
Potassium: 3.8 mEq/L (ref 3.5–5.3)

## 2011-12-01 LAB — PSA: PSA: 406.5 ng/mL — ABNORMAL HIGH (ref <=4.00)

## 2011-12-01 MED ORDER — HEPARIN SOD (PORK) LOCK FLUSH 100 UNIT/ML IV SOLN
500.0000 [IU] | Freq: Once | INTRAVENOUS | Status: AC
  Administered 2011-12-01: 500 [IU] via INTRAVENOUS
  Filled 2011-12-01: qty 5

## 2011-12-01 MED ORDER — SODIUM CHLORIDE 0.9 % IJ SOLN
10.0000 mL | INTRAMUSCULAR | Status: DC | PRN
  Administered 2011-12-01: 10 mL via INTRAVENOUS
  Filled 2011-12-01: qty 10

## 2011-12-01 NOTE — Telephone Encounter (Signed)
scheduled pt for  mri on 04/26 @ [facility]. pt wanted to have labs done on today scheduled as a walking.provided appt for 05/02

## 2011-12-01 NOTE — Progress Notes (Signed)
Hematology and Oncology Follow Up Visit  Andrew Nelson [id number] 1950/11/01 61 y.o. 12/01/2011 2:04 PM  CC:  Andrew Purpura, MD  Andrew Hoes. [name], MD  Andrew Nelson, M.D.    Principle Diagnosis: :  This is a 61 year old gentleman with prostate cancer initially diagnosed in 2007.  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  Prior Therapy: 1. Status post laparoscopic prostatectomy and bilateral lymphadenectomy, postoperative PSA was 0.5. 2. He received salvage radiation therapy due to persistent elevation in his PSA. 3. Patient treated with Lupron and Casodex due to a rise in his PSA, subsequently treated with Casodex withdrawal. 4. 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 5. Patient treated with 10 cycles of Taxotere and prednisone.  Taxotere was reduced to 60 mg/sq m; last dose given was in October 2011.  However, PSA started to rise up to 250.  Current therapy: He is on Zytiga 1000 mg daily with prednisone 5 mg daily since October 2011.  His PSA was as high as 315, down to 264 and had been relatively stable. Last PSA up to 318 on 10/08/11.  Interim History:  Andrew Nelson presents today for a followup visit with is wife. He contacted our office yesterday reporting pain in his right groin and swelling to his lower extremities. The patient states that the pain started on Friday after he played golf. He does not recall any specific injury. The patient stopped his Prednisone on his own because he thought the symptoms were related to the medication. He states that the swelling is somewhat better, but he cannot bear weight very well on his right leg. He is uncomfortable when sitting and pain worse when he externally rotates right leg. He denies radiation of pain down his leg or up to his back. He has not felt any  enlarged lymph nodes in his groin. He has continued to take his Zytiga. He denies chest pain, shortness of breath, dyspnea. He had not had any abdominal pain, nausea, or vomiting. He notes urinary frequency, but no dysuria or hematuria. Appetite and weight are stable.  Medications: I have reviewed the patient's current medications. Current outpatient prescriptions:abiraterone Acetate (ZYTIGA) 250 MG tablet, Take 4 tablets (1,000 mg total) by mouth daily. Take on an empty stomach 1 hour before or 2 hours after a meal, Disp: 120 tablet, Rfl: 2;  calcium-vitamin D 250-100 MG-UNIT per tablet, Take 1 tablet by mouth daily.  , Disp: , Rfl: ;  [employer] SENNA 8.6 MG tablet, TAKE 1 TABLET BY MOUTH EVERY DAY, Disp: 90 tablet, Rfl: 0 doxazosin (CARDURA) 8 MG tablet, Take 1 tablet (8 mg total) by mouth daily., Disp: 100 tablet, Rfl: 3;  fish oil-omega-3 fatty acids 1000 MG capsule, Take 1 g by mouth daily.  , Disp: , Rfl: ;  glucose blood (ACCU-CHEK ACTIVE STRIPS) test strip, Use as instructed, Disp: 100 each, Rfl: 3;  hydrochlorothiazide 25 MG tablet, Take 1 tablet (25 mg total) by mouth daily., Disp: 100 tablet, Rfl: 3 leuprolide (LUPRON) 30 MG injection, Inject 30 mg into the muscle every 4 (four) months.  , Disp: , Rfl: ;  lidocaine-prilocaine (EMLA) cream, Apply topically as needed.  , Disp: , Rfl: ;  lisinopril (ZESTRIL) 10 MG tablet, Take 1 tablet (10 mg total) by mouth daily., Disp: 100 tablet, Rfl: 3;  metFORMIN (GLUCOPHAGE) 850 MG tablet, Take  1 tablet (850 mg total) by mouth 2 (two) times daily with a meal., Disp: 200 tablet, Rfl: 3 NOVOLOG MIX 70/30 (70-30) 100 UNIT/ML injection, INJECT 20 UNITS EVERY DAY, Disp: 20 mL, Rfl: 3;  predniSONE (DELTASONE) 5 MG tablet, TAKE 1 TABLET BY MOUTH EVERY DAY, Disp: 90 tablet, Rfl: 0 No current facility-administered medications for this visit. Facility-Administered Medications Ordered in Other Visits: heparin lock flush 100 unit/mL, 500 Units, Intravenous, Once, [name],  MD;  sodium chloride 0.9 % injection 10 mL, 10 mL, Intravenous, PRN, [name], MD  Allergies:  Allergies  Allergen Reactions  . Aprindine   . Bc Powder (Aspirin-Salicylamide-Caffeine)   . Penicillins     Past Medical History, Surgical history, Social history, and Family History were reviewed and updated.  Review of Systems: Constitutional:  Negative for fever, chills, night sweats, anorexia, weight loss, pain. Cardiovascular: no chest pain or dyspnea on exertion Respiratory: no cough, shortness of breath, or wheezing Neurological: negative Dermatological: negative ENT: negative Skin: Negative. Gastrointestinal: no abdominal pain, change in bowel habits, or black or bloody stools Genito-Urinary: no dysuria, trouble voiding, or hematuria Hematological and Lymphatic: negative Breast: negative Musculoskeletal: Pain to right groin Remaining ROS negative.  Physical Exam: Blood pressure 147/85, pulse 102, temperature 98.6 F (37 C), temperature source Oral, height 5' 7.5" (1.715 m), weight 202 lb 3.2 oz (91.717 kg). ECOG: 1 General appearance: alert Head: Normocephalic, without obvious abnormality, atraumatic Neck: no adenopathy, no carotid bruit, no JVD, supple, symmetrical, trachea midline and thyroid not enlarged, symmetric, no tenderness/mass/nodules Lymph nodes: Cervical, supraclavicular, and axillary nodes normal. Heart:regular rate and rhythm, S1, S2 normal, no murmur, click, rub or gallop Lung:chest clear, no wheezing, rales, normal symmetric air entry Abdomen: soft, non-tender, without masses or organomegaly EXT:no erythema, induration, or nodules. Trace pedal edema.  Lab Results: Lab Results  Component Value Date   WBC 7.0 10/08/2011   HGB 10.0* 10/08/2011   HCT 32.5* 10/08/2011   MCV 77.4* 10/08/2011   PLT 270 10/08/2011     Chemistry      Component Value Date/Time   NA 139 10/08/2011 0834   NA 141 08/13/2011 1129   K 3.5 10/08/2011 0834   K 3.7 08/13/2011 1129     CL 102 10/08/2011 0834   CL 103 08/13/2011 1129   CO2 24 10/08/2011 0834   CO2 28 08/13/2011 1129   BUN 15 10/08/2011 0834   BUN 15 08/13/2011 1129   CREATININE 0.74 10/08/2011 0834   CREATININE 0.5* 08/13/2011 1129      Component Value Date/Time   CALCIUM 9.6 10/08/2011 0834   CALCIUM 9.3 08/13/2011 1129   ALKPHOS 73 10/08/2011 0834   ALKPHOS 76 08/13/2011 1129   AST 14 10/08/2011 0834   AST 12 08/13/2011 1129   ALT 12 10/08/2011 0834   BILITOT 0.4 10/08/2011 0834   BILITOT 0.50 08/13/2011 1129     Results for MORIAH, LOUGHRY (MRN [id number]) as of 12/01/2011 14:04  Ref. Range 03/19/2011 09:31 04/30/2011 11:30 06/11/2011 10:38 08/13/2011 11:29 10/08/2011 08:34  PSA Latest Range: <=4.00 ng/mL 264.40 (H) 306.60 (H) 258.80 (H) 235.10 (H) 318.90 (H)   Impression and Plan:  This is a pleasant 61 year old gentleman with the following issues: 1. 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  He is currently on Zytiga, but recently stopped Prednisone as he thought this was causing the lower extremity edema and groin pain. Recommended that he stop Zytiga as well  to see if discontinuing drug may improve symptoms. He has a follow-up scheduled next week and will decide whether or not to restart medication at that time. Repeat CMET and PSA is scheduled for next week. 2. Right hip pain: Etiology not clear. Could be a pulled muscle versus new bone metastasis. MRI of right hip ordered for further evaluation. Patient does not want any pain medications at this time. 3. Port-A-Cath management.  Continue Port-A-Cath flush every 6-8 weeks.   4. Anemia that is multifactorial. Hemoglobin has remained stable. He will have a repeat CBC next week with his visit. No bleeding today and he is asymptomatic. No transfusion indicated.  5. Androgen deprivation.  He will receive Lupron every 4 months. Last injection given on 10/08/11. 6. Follow-up. He will keep his appointment  with Dr Clelia Croft on 12/10/11.  The patient was seen and examined with Dr Clelia Croft.  Clenton Pare 4/23/20132:04 PM

## 2011-12-01 NOTE — Patient Instructions (Signed)
Call MD for problems 

## 2011-12-04 ENCOUNTER — Ambulatory Visit (HOSPITAL_COMMUNITY)
Admission: RE | Admit: 2011-12-04 | Discharge: 2011-12-04 | Disposition: A | Discharge status: Home / Self Care | Payer: BC Managed Care – PPO | Source: Ambulatory Visit | Attending: Oncology | Admitting: Oncology

## 2011-12-04 DIAGNOSIS — M25559 Pain in unspecified hip: Secondary | ICD-10-CM | POA: Insufficient documentation

## 2011-12-04 DIAGNOSIS — R1909 Other intra-abdominal and pelvic swelling, mass and lump: Secondary | ICD-10-CM | POA: Insufficient documentation

## 2011-12-04 DIAGNOSIS — Z8546 Personal history of malignant neoplasm of prostate: Secondary | ICD-10-CM | POA: Insufficient documentation

## 2011-12-04 DIAGNOSIS — M87059 Idiopathic aseptic necrosis of unspecified femur: Secondary | ICD-10-CM | POA: Insufficient documentation

## 2011-12-04 MED ORDER — GADOBENATE DIMEGLUMINE 529 MG/ML IV SOLN
19.0000 mL | Freq: Once | INTRAVENOUS | Status: AC | PRN
  Administered 2011-12-04: 19 mL via INTRAVENOUS

## 2011-12-09 ENCOUNTER — Telehealth: Payer: Self-pay | Admitting: *Deleted

## 2011-12-09 NOTE — Telephone Encounter (Signed)
[facility] faxed refill request for zytiga.  Request to MD for review.

## 2011-12-10 ENCOUNTER — Telehealth: Payer: Self-pay | Admitting: Oncology

## 2011-12-10 ENCOUNTER — Other Ambulatory Visit: Payer: BC Managed Care – PPO | Admitting: Lab

## 2011-12-10 ENCOUNTER — Ambulatory Visit (HOSPITAL_BASED_OUTPATIENT_CLINIC_OR_DEPARTMENT_OTHER): Payer: BC Managed Care – PPO | Admitting: Oncology

## 2011-12-10 VITALS — BP 126/76 | HR 78 | Temp 98.1°F | Ht 67.5 in | Wt 203.7 lb

## 2011-12-10 DIAGNOSIS — R109 Unspecified abdominal pain: Secondary | ICD-10-CM

## 2011-12-10 DIAGNOSIS — D649 Anemia, unspecified: Secondary | ICD-10-CM

## 2011-12-10 DIAGNOSIS — C61 Malignant neoplasm of prostate: Secondary | ICD-10-CM

## 2011-12-10 DIAGNOSIS — C7951 Secondary malignant neoplasm of bone: Secondary | ICD-10-CM

## 2011-12-10 NOTE — Telephone Encounter (Signed)
Gv pt appt for [id number] 

## 2011-12-10 NOTE — Progress Notes (Signed)
Hematology and Oncology Follow Up Visit  Andrew Nelson [id number] Sep 01, 1950 61 y.o. 12/10/2011 10:02 AM  CC:  Andrew Purpura, MD  Andrew Hoes. [name], MD  Andrew Nelson, M.D.    Principle Diagnosis: :  This is a 61 year old gentleman with prostate cancer initially diagnosed in 2007.  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    Prior Therapy: 1. Status post laparoscopic prostatectomy and bilateral lymphadenectomy, postoperative PSA was 0.5. 2. He received salvage radiation therapy due to persistent elevation in his PSA. 3. Patient treated with Lupron and Casodex due to a rise in his PSA, subsequently treated with Casodex withdrawal. 4. 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 5. Patient treated with 10 cycles of Taxotere and prednisone.  Taxotere was reduced to 60 mg/sq m; last dose given was in October 2011.  However, PSA started to rise up to 250.  Current therapy: He is on Zytiga 1000 mg daily with prednisone 5 mg daily since October 2011.  His PSA was as high as 315, down to 264 and has been relatively stable. And most recently PSA is up to 406.   Interim History:  [name] presents today for a followup visit.  He has continued to tolerate Zytiga very well without any major toxicity (this was held for few days to groin pain).  He had not had any abdominal pain, had not had any discomfort, had not had any fluid retention at this time.  Had not had any major changes in his performance status or activity level.  Had not had any major changes in his urine flow at this time. No new symptoms at this time. Hr continues to be active since the last visit.  He is still complaining of right groin pain. He is able to ambulate without difficulty but not able to play golf.   Medications: I have reviewed the  patient's current medications. Current outpatient prescriptions:abiraterone Acetate (ZYTIGA) 250 MG tablet, Take 4 tablets (1,000 mg total) by mouth daily. Take on an empty stomach 1 hour before or 2 hours after a meal, Disp: 120 tablet, Rfl: 2;  calcium-vitamin D 250-100 MG-UNIT per tablet, Take 1 tablet by mouth daily.  , Disp: , Rfl: ;  [employer]S SENNA 8.6 MG tablet, TAKE 1 TABLET BY MOUTH EVERY DAY, Disp: 90 tablet, Rfl: 0 doxazosin (CARDURA) 8 MG tablet, Take 1 tablet (8 mg total) by mouth daily., Disp: 100 tablet, Rfl: 3;  fish oil-omega-3 fatty acids 1000 MG capsule, Take 1 g by mouth daily.  , Disp: , Rfl: ;  glucose blood (ACCU-CHEK ACTIVE STRIPS) test strip, Use as instructed, Disp: 100 each, Rfl: 3;  hydrochlorothiazide 25 MG tablet, Take 1 tablet (25 mg total) by mouth daily., Disp: 100 tablet, Rfl: 3 leuprolide (LUPRON) 30 MG injection, Inject 30 mg into the muscle every 4 (four) months.  , Disp: , Rfl: ;  lidocaine-prilocaine (EMLA) cream, Apply topically as needed.  , Disp: , Rfl: ;  lisinopril (ZESTRIL) 10 MG tablet, Take 1 tablet (10 mg total) by mouth daily., Disp: 100 tablet, Rfl: 3;  metFORMIN (GLUCOPHAGE) 850 MG tablet, Take 1 tablet (850 mg total) by mouth 2 (two) times daily with a meal., Disp: 200 tablet, Rfl: 3 NOVOLOG MIX 70/30 (70-30) 100 UNIT/ML injection, INJECT 20 UNITS EVERY DAY, Disp: 20 mL, Rfl: 3;  predniSONE (DELTASONE) 5  MG tablet, TAKE 1 TABLET BY MOUTH EVERY DAY, Disp: 90 tablet, Rfl: 0  Allergies:  Allergies  Allergen Reactions  . Aprindine   . Bc Powder (Aspirin-Salicylamide-Caffeine)   . Penicillins     Past Medical History, Surgical history, Social history, and Family History were reviewed and updated.  Review of Systems: Constitutional:  Negative for fever, chills, night sweats, anorexia, weight loss, pain. Cardiovascular: no chest pain or dyspnea on exertion Respiratory: no cough, shortness of breath, or wheezing Neurological: negative Dermatological:  negative ENT: negative Skin: Negative. Gastrointestinal: no abdominal pain, change in bowel habits, or black or bloody stools Genito-Urinary: no dysuria, trouble voiding, or hematuria Hematological and Lymphatic: negative Breast: negative Musculoskeletal: negative Remaining ROS negative. Physical Exam: Blood pressure 126/76, pulse 78, temperature 98.1 F (36.7 C), temperature source Oral, height 5' 7.5" (1.715 m), weight 203 lb 11.2 oz (92.398 kg). ECOG: 1 General appearance: alert Head: Normocephalic, without obvious abnormality, atraumatic Neck: no adenopathy, no carotid bruit, no JVD, supple, symmetrical, trachea midline and thyroid not enlarged, symmetric, no tenderness/mass/nodules Lymph nodes: Cervical, supraclavicular, and axillary nodes normal. Heart:regular rate and rhythm, S1, S2 normal, no murmur, click, rub or gallop Lung:chest clear, no wheezing, rales, normal symmetric air entry Abdomin: soft, non-tender, without masses or organomegaly EXT:no erythema, induration, or nodules   Lab Results: Lab Results  Component Value Date   WBC 6.7 12/01/2011   HGB 9.8* 12/01/2011   HCT 31.5* 12/01/2011   MCV 78.0* 12/01/2011   PLT 266 12/01/2011     Chemistry      Component Value Date/Time   NA 139 12/01/2011 1335   NA 141 08/13/2011 1129   K 3.8 12/01/2011 1335   K 3.7 08/13/2011 1129   CL 104 12/01/2011 1335   CL 103 08/13/2011 1129   CO2 24 12/01/2011 1335   CO2 28 08/13/2011 1129   BUN 11 12/01/2011 1335   BUN 15 08/13/2011 1129   CREATININE 0.68 12/01/2011 1335   CREATININE 0.5* 08/13/2011 1129      Component Value Date/Time   CALCIUM 9.8 12/01/2011 1335   CALCIUM 9.3 08/13/2011 1129   ALKPHOS 71 12/01/2011 1335   ALKPHOS 76 08/13/2011 1129   AST 15 12/01/2011 1335   AST 12 08/13/2011 1129   ALT 13 12/01/2011 1335   BILITOT 0.3 12/01/2011 1335   BILITOT 0.50 08/13/2011 1129     MRI OF THE RIGHT HIP WITHOUT AND WITH CONTRAST  Technique: Multiplanar, multisequence MR imaging was performed    both before and after administration of intravenous contrast.  Contrast: 19mL MULTIHANCE GADOBENATE DIMEGLUMINE 529 MG/ML IV SOLN  Comparison: Whole body bone scan 08/13/2011. CT abdomen and pelvis  08/13/2011.  Findings: There is abnormal marrow signal in the femoral heads  bilaterally consistent with avascular necrosis. No flattening of  the femoral heads is visible by MR. There is some edema in the  right femoral neck. Small bilateral hip joint effusions are noted.  There are innumerable foci of T1 hypointensity in the lower lumbar  spine and pelvis. These do not enhance or demonstrate T2  hyperintensity but are worrisome for metastases. Musculature about  the pelvis appears intact. No focal fluid collection is  identified. Intrapelvic contents demonstrate postoperative change  of prostatectomy. Presacral soft tissue mass is seen as on  comparison CT scan is also noted.  IMPRESSION:  1. 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 Changes appear somewhat worse on the right with marrow  edema identified in the  right femoral neck.  2. Innumerable foci of T1 hypointensity in the visualized spine  and throughout the pelvis. Despite negative bone scan, finding is  worrisome for metastatic disease.  3. Presacral mass is identified as seen on prior CT.    Impression and Plan:  This is a pleasant 61 year old gentleman with the following issues: 1. 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  He is currently on Zytiga and prednisone.  Again, his PSA had been relatively stable but has increased on two separate visits. I will continue his Zytiga for now, but if his next PSA is going up I will switch him to [place].  2. Port-A-Cath management.  Continue Port-A-Cath flush every 6-8 weeks.   3. Anemia that is multifactorial. His Hgb is stable. 4. Androgen deprivation.  He will receive Lupron  probably today and every four months. Next treatment will be in 02/2012. 5. Groin strain/pull: I see no clear evidence of cancer involvement in the hip that could be causing this pain. In all likelihood, this is related to a muscle strain. I continued to recommend rest and supportive measures.   [facility], MD 5/2/201310:02 AM

## 2012-01-01 ENCOUNTER — Encounter: Payer: Self-pay | Admitting: *Deleted

## 2012-01-01 NOTE — Progress Notes (Signed)
Fax confirmation from [facility] that Andrew Nelson will be shipped for delivery on 01/05/12.

## 2012-01-05 ENCOUNTER — Other Ambulatory Visit: Payer: Self-pay | Admitting: Oncology

## 2012-01-07 ENCOUNTER — Other Ambulatory Visit (HOSPITAL_BASED_OUTPATIENT_CLINIC_OR_DEPARTMENT_OTHER): Payer: BC Managed Care – PPO | Admitting: Lab

## 2012-01-07 ENCOUNTER — Ambulatory Visit (HOSPITAL_BASED_OUTPATIENT_CLINIC_OR_DEPARTMENT_OTHER): Payer: BC Managed Care – PPO

## 2012-01-07 VITALS — BP 123/77 | HR 102 | Temp 99.0°F

## 2012-01-07 DIAGNOSIS — C61 Malignant neoplasm of prostate: Secondary | ICD-10-CM

## 2012-01-07 LAB — COMPREHENSIVE METABOLIC PANEL
ALT: 10 U/L (ref 0–53)
AST: 11 U/L (ref 0–37)
Alkaline Phosphatase: 74 U/L (ref 39–117)
Sodium: 139 mEq/L (ref 135–145)
Total Bilirubin: 0.4 mg/dL (ref 0.3–1.2)
Total Protein: 6.9 g/dL (ref 6.0–8.3)

## 2012-01-07 LAB — CBC WITH DIFFERENTIAL/PLATELET
BASO%: 1.2 % (ref 0.0–2.0)
EOS%: 2.1 % (ref 0.0–7.0)
MCH: 23.4 pg — ABNORMAL LOW (ref 27.2–33.4)
MCV: 75.7 fL — ABNORMAL LOW (ref 79.3–98.0)
MONO%: 6.5 % (ref 0.0–14.0)
RBC: 4.03 10*6/uL — ABNORMAL LOW (ref 4.20–5.82)
RDW: 16.6 % — ABNORMAL HIGH (ref 11.0–14.6)
lymph#: 1.5 10*3/uL (ref 0.9–3.3)
nRBC: 0 % (ref 0–0)

## 2012-01-07 MED ORDER — SODIUM CHLORIDE 0.9 % IJ SOLN
10.0000 mL | INTRAMUSCULAR | Status: DC | PRN
  Administered 2012-01-07: 10 mL via INTRAVENOUS
  Filled 2012-01-07: qty 10

## 2012-01-07 MED ORDER — HEPARIN SOD (PORK) LOCK FLUSH 100 UNIT/ML IV SOLN
500.0000 [IU] | Freq: Once | INTRAVENOUS | Status: AC
  Administered 2012-01-07: 500 [IU] via INTRAVENOUS
  Filled 2012-01-07: qty 5

## 2012-01-14 ENCOUNTER — Ambulatory Visit (HOSPITAL_BASED_OUTPATIENT_CLINIC_OR_DEPARTMENT_OTHER): Payer: BC Managed Care – PPO | Admitting: Oncology

## 2012-01-14 ENCOUNTER — Telehealth: Payer: Self-pay | Admitting: Oncology

## 2012-01-14 VITALS — BP 116/78 | HR 127 | Temp 98.8°F | Ht 67.5 in | Wt 195.5 lb

## 2012-01-14 DIAGNOSIS — C61 Malignant neoplasm of prostate: Secondary | ICD-10-CM

## 2012-01-14 DIAGNOSIS — D649 Anemia, unspecified: Secondary | ICD-10-CM

## 2012-01-14 DIAGNOSIS — E291 Testicular hypofunction: Secondary | ICD-10-CM

## 2012-01-14 DIAGNOSIS — R109 Unspecified abdominal pain: Secondary | ICD-10-CM

## 2012-01-14 NOTE — Progress Notes (Signed)
Hematology and Oncology Follow Up Visit  Andrew Nelson [id number] 10-30-1950 61 y.o. 01/14/2012 10:34 AM  CC:  Andrew Purpura, MD  Andrew Hoes. [name], MD  Andrew Nelson, M.D.    Principle Diagnosis: :  This is a 61 year old gentleman with prostate cancer initially diagnosed in 2007.  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  Prior Therapy: 1. Status post laparoscopic prostatectomy and bilateral lymphadenectomy, postoperative PSA was 0.5. 2. He received salvage radiation therapy due to persistent elevation in his PSA. 3. Patient treated with Lupron and Casodex due to a rise in his PSA, subsequently treated with Casodex withdrawal. 4. 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 5. Patient treated with 10 cycles of Taxotere and prednisone.  Taxotere was reduced to 60 mg/sq m; last dose given was in October 2011.  However, PSA started to rise up to 250.  Current therapy: He is on Zytiga 1000 mg daily with prednisone 5 mg daily since October 2011.  His PSA was as high as 315, down to 264 and has been relatively stable. And most recently PSA is up to 420.   Interim History:  [name] presents today for a followup visit.  He has continued to tolerate Zytiga very well without any major toxicity (this was held for few days to groin pain).  He had not had any abdominal pain, had not had any discomfort, had not had any fluid retention at this time.  Had not had any major changes in his performance status or activity level.  Had not had any major changes in his urine flow at this time. No new symptoms at this time. Hr continues to be active since the last visit.  He is still complaining of right groin pain. He is able to ambulate without difficulty but not able to play golf. This has not improved at this time.    Medications: I have reviewed the patient's current medications. Current outpatient prescriptions:abiraterone Acetate (ZYTIGA) 250 MG tablet, Take 4 tablets (1,000 mg total) by mouth daily. Take on an empty stomach 1 hour before or 2 hours after a meal, Disp: 120 tablet, Rfl: 2;  calcium-vitamin D 250-100 MG-UNIT per tablet, Take 1 tablet by mouth daily.  , Disp: , Rfl: ;  [employer]S SENNA 8.6 MG tablet, TAKE 1 TABLET BY MOUTH EVERY DAY, Disp: 90 tablet, Rfl: 0 doxazosin (CARDURA) 8 MG tablet, Take 1 tablet (8 mg total) by mouth daily., Disp: 100 tablet, Rfl: 3;  fish oil-omega-3 fatty acids 1000 MG capsule, Take 1 g by mouth daily.  , Disp: , Rfl: ;  glucose blood (ACCU-CHEK ACTIVE STRIPS) test strip, Use as instructed, Disp: 100 each, Rfl: 3;  hydrochlorothiazide 25 MG tablet, Take 1 tablet (25 mg total) by mouth daily., Disp: 100 tablet, Rfl: 3 leuprolide (LUPRON) 30 MG injection, Inject 30 mg into the muscle every 4 (four) months.  , Disp: , Rfl: ;  lisinopril (ZESTRIL) 10 MG tablet, Take 1 tablet (10 mg total) by mouth daily., Disp: 100 tablet, Rfl: 3;  metFORMIN (GLUCOPHAGE) 850 MG tablet, Take 1 tablet (850 mg total) by mouth 2 (two) times daily with a meal., Disp: 200 tablet, Rfl: 3;  NOVOLOG MIX 70/30 (70-30) 100 UNIT/ML injection, INJECT 20 UNITS EVERY DAY, Disp: 20 mL, Rfl: 3 predniSONE (DELTASONE) 5 MG tablet, TAKE 1 TABLET BY MOUTH EVERY DAY,  Disp: 90 tablet, Rfl: 0  Allergies:  Allergies  Allergen Reactions  . Aprindine   . Bc Powder (Aspirin-Salicylamide-Caffeine)   . Penicillins     Past Medical History, Surgical history, Social history, and Family History were reviewed and updated.  Review of Systems: Constitutional:  Negative for fever, chills, night sweats, anorexia, weight loss, pain. Cardiovascular: no chest pain or dyspnea on exertion Respiratory: no cough, shortness of breath, or wheezing Neurological: negative Dermatological: negative ENT: negative Skin:  Negative. Gastrointestinal: no abdominal pain, change in bowel habits, or black or bloody stools Genito-Urinary: no dysuria, trouble voiding, or hematuria Hematological and Lymphatic: negative Breast: negative Musculoskeletal: negative Remaining ROS negative. Physical Exam: Blood pressure 116/78, pulse 127, temperature 98.8 F (37.1 C), temperature source Oral, height 5' 7.5" (1.715 m), weight 195 lb 8 oz (88.678 kg). ECOG: 1 General appearance: alert Head: Normocephalic, without obvious abnormality, atraumatic Neck: no adenopathy, no carotid bruit, no JVD, supple, symmetrical, trachea midline and thyroid not enlarged, symmetric, no tenderness/mass/nodules Lymph nodes: Cervical, supraclavicular, and axillary nodes normal. Heart:regular rate and rhythm, S1, S2 normal, no murmur, click, rub or gallop Lung:chest clear, no wheezing, rales, normal symmetric air entry Abdomin: soft, non-tender, without masses or organomegaly EXT:no erythema, induration, or nodules   Lab Results: Lab Results  Component Value Date   WBC 6.7 01/07/2012   HGB 9.4* 01/07/2012   HCT 30.5* 01/07/2012   MCV 75.7* 01/07/2012   PLT 222 01/07/2012     Chemistry      Component Value Date/Time   NA 139 01/07/2012 1027   NA 141 08/13/2011 1129   K 3.6 01/07/2012 1027   K 3.7 08/13/2011 1129   CL 103 01/07/2012 1027   CL 103 08/13/2011 1129   CO2 23 01/07/2012 1027   CO2 28 08/13/2011 1129   BUN 16 01/07/2012 1027   BUN 15 08/13/2011 1129   CREATININE 0.76 01/07/2012 1027   CREATININE 0.5* 08/13/2011 1129      Component Value Date/Time   CALCIUM 9.4 01/07/2012 1027   CALCIUM 9.3 08/13/2011 1129   ALKPHOS 74 01/07/2012 1027   ALKPHOS 76 08/13/2011 1129   AST 11 01/07/2012 1027   AST 12 08/13/2011 1129   ALT 10 01/07/2012 1027   BILITOT 0.4 01/07/2012 1027   BILITOT 0.50 08/13/2011 1129       Impression and Plan:  This is a pleasant 61 year old gentleman with the following issues: 1. Castration-resistant prostate cancer.  He has  metastatic disease with lymphadenopathy and pelvic presacral mass.  He is currently on Zytiga and prednisone.  Again, his PSA had been relatively stable but has increased on two separate visits. I will continue his Zytiga for now, but if his  PSA continues to go up I will switch him to [place]. He would like to stay on Zytiga for now. 2. Port-A-Cath management.  Continue Port-A-Cath flush every 6-8 weeks.  Last flush on 5/30 3. Anemia that is multifactorial. His Hgb is stable. 4. Androgen deprivation.  He will receive Lupron probably today and every four months. Next treatment will be in 02/18/2012. 5. Groin strain/pull: I see no clear evidence of cancer involvement in the hip that could be causing this pain. In all likelihood, this is related to a muscle strain. I will make referral to Orthopedics sports medicine specialty.    [facility], MD 6/6/201310:34 AM

## 2012-01-14 NOTE — Telephone Encounter (Signed)
Gv pt appt for [id number].  scheduled appt with Dr, Farris Has @ [facility] for 06/07 @ 9am

## 2012-01-16 ENCOUNTER — Other Ambulatory Visit: Payer: Self-pay | Admitting: Family Medicine

## 2012-01-22 ENCOUNTER — Other Ambulatory Visit: Payer: Self-pay | Admitting: Oncology

## 2012-01-22 DIAGNOSIS — C61 Malignant neoplasm of prostate: Secondary | ICD-10-CM

## 2012-01-25 ENCOUNTER — Telehealth: Payer: Self-pay | Admitting: *Deleted

## 2012-01-25 NOTE — Telephone Encounter (Signed)
Patient called about bilateral swelling of the feet.  Feet are not painful, erythematous.  Patient able to ambulate without difficulty.  Patient no longer taking Prednisone.  Advised patient to keep feet elevated, limit salt intake.  Advised that if swelling does not improve or worsens, to contact PCP who manages cardiovascular medications (which includes HCTZ).

## 2012-01-26 ENCOUNTER — Encounter: Payer: Self-pay | Admitting: *Deleted

## 2012-01-26 NOTE — Progress Notes (Signed)
Spoke with Star at [facility] [phone].  Patient not taking prednisone.  Verified with Dixie, RN who spoke with patient last week to discontinue medication.

## 2012-02-18 ENCOUNTER — Ambulatory Visit (HOSPITAL_BASED_OUTPATIENT_CLINIC_OR_DEPARTMENT_OTHER): Payer: BC Managed Care – PPO

## 2012-02-18 ENCOUNTER — Encounter: Payer: Self-pay | Admitting: Oncology

## 2012-02-18 ENCOUNTER — Encounter (HOSPITAL_COMMUNITY)
Admission: RE | Admit: 2012-02-18 | Discharge: 2012-02-18 | Disposition: A | Discharge status: Home / Self Care | Payer: BC Managed Care – PPO | Source: Ambulatory Visit | Attending: Oncology | Admitting: Oncology

## 2012-02-18 ENCOUNTER — Other Ambulatory Visit (HOSPITAL_BASED_OUTPATIENT_CLINIC_OR_DEPARTMENT_OTHER): Payer: BC Managed Care – PPO

## 2012-02-18 ENCOUNTER — Ambulatory Visit (HOSPITAL_BASED_OUTPATIENT_CLINIC_OR_DEPARTMENT_OTHER): Payer: BC Managed Care – PPO | Admitting: Oncology

## 2012-02-18 VITALS — BP 134/71 | HR 109 | Temp 98.9°F | Ht 67.5 in | Wt 183.5 lb

## 2012-02-18 VITALS — BP 120/75 | HR 76 | Temp 98.7°F | Resp 18

## 2012-02-18 DIAGNOSIS — I10 Essential (primary) hypertension: Secondary | ICD-10-CM

## 2012-02-18 DIAGNOSIS — C61 Malignant neoplasm of prostate: Secondary | ICD-10-CM

## 2012-02-18 DIAGNOSIS — M8700 Idiopathic aseptic necrosis of unspecified bone: Secondary | ICD-10-CM

## 2012-02-18 DIAGNOSIS — Z8546 Personal history of malignant neoplasm of prostate: Secondary | ICD-10-CM

## 2012-02-18 DIAGNOSIS — R5381 Other malaise: Secondary | ICD-10-CM

## 2012-02-18 DIAGNOSIS — Z5111 Encounter for antineoplastic chemotherapy: Secondary | ICD-10-CM

## 2012-02-18 DIAGNOSIS — C779 Secondary and unspecified malignant neoplasm of lymph node, unspecified: Secondary | ICD-10-CM

## 2012-02-18 DIAGNOSIS — D126 Benign neoplasm of colon, unspecified: Secondary | ICD-10-CM

## 2012-02-18 DIAGNOSIS — D649 Anemia, unspecified: Secondary | ICD-10-CM

## 2012-02-18 DIAGNOSIS — F17200 Nicotine dependence, unspecified, uncomplicated: Secondary | ICD-10-CM

## 2012-02-18 DIAGNOSIS — Z86010 Personal history of colonic polyps: Secondary | ICD-10-CM

## 2012-02-18 DIAGNOSIS — E109 Type 1 diabetes mellitus without complications: Secondary | ICD-10-CM

## 2012-02-18 DIAGNOSIS — D509 Iron deficiency anemia, unspecified: Secondary | ICD-10-CM

## 2012-02-18 DIAGNOSIS — C50919 Malignant neoplasm of unspecified site of unspecified female breast: Secondary | ICD-10-CM

## 2012-02-18 LAB — CBC WITH DIFFERENTIAL/PLATELET
BASO%: 0.5 % (ref 0.0–2.0)
Basophils Absolute: 0 10*3/uL (ref 0.0–0.1)
EOS%: 0 % (ref 0.0–7.0)
MCH: 21.6 pg — ABNORMAL LOW (ref 27.2–33.4)
MCHC: 30.5 g/dL — ABNORMAL LOW (ref 32.0–36.0)
MCV: 70.7 fL — ABNORMAL LOW (ref 79.3–98.0)
MONO%: 5.4 % (ref 0.0–14.0)
RBC: 3.48 10*6/uL — ABNORMAL LOW (ref 4.20–5.82)
RDW: 18.5 % — ABNORMAL HIGH (ref 11.0–14.6)
nRBC: 0 % (ref 0–0)

## 2012-02-18 LAB — COMPREHENSIVE METABOLIC PANEL
Alkaline Phosphatase: 64 U/L (ref 39–117)
Glucose, Bld: 127 mg/dL — ABNORMAL HIGH (ref 70–99)
Sodium: 137 mEq/L (ref 135–145)
Total Bilirubin: 0.7 mg/dL (ref 0.3–1.2)
Total Protein: 6.7 g/dL (ref 6.0–8.3)

## 2012-02-18 MED ORDER — DIPHENHYDRAMINE HCL 25 MG PO CAPS
25.0000 mg | ORAL_CAPSULE | Freq: Once | ORAL | Status: AC
  Administered 2012-02-18: 25 mg via ORAL

## 2012-02-18 MED ORDER — SODIUM CHLORIDE 0.9 % IV SOLN
250.0000 mL | Freq: Once | INTRAVENOUS | Status: AC
  Administered 2012-02-18: 250 mL via INTRAVENOUS

## 2012-02-18 MED ORDER — SODIUM CHLORIDE 0.9 % IJ SOLN
10.0000 mL | INTRAMUSCULAR | Status: DC | PRN
  Administered 2012-02-18: 10 mL via INTRAVENOUS
  Filled 2012-02-18: qty 10

## 2012-02-18 MED ORDER — ACETAMINOPHEN 325 MG PO TABS
650.0000 mg | ORAL_TABLET | Freq: Once | ORAL | Status: AC
  Administered 2012-02-18: 650 mg via ORAL

## 2012-02-18 MED ORDER — HEPARIN SOD (PORK) LOCK FLUSH 100 UNIT/ML IV SOLN
500.0000 [IU] | Freq: Every day | INTRAVENOUS | Status: AC | PRN
  Administered 2012-02-18: 500 [IU]
  Filled 2012-02-18: qty 5

## 2012-02-18 MED ORDER — HEPARIN SOD (PORK) LOCK FLUSH 100 UNIT/ML IV SOLN
500.0000 [IU] | Freq: Once | INTRAVENOUS | Status: AC
  Administered 2012-02-18: 500 [IU] via INTRAVENOUS
  Filled 2012-02-18: qty 5

## 2012-02-18 MED ORDER — LEUPROLIDE ACETATE (4 MONTH) 30 MG IM KIT
30.0000 mg | PACK | Freq: Once | INTRAMUSCULAR | Status: AC
  Administered 2012-02-18: 30 mg via INTRAMUSCULAR
  Filled 2012-02-18: qty 30

## 2012-02-18 MED ORDER — SODIUM CHLORIDE 0.9 % IJ SOLN
10.0000 mL | INTRAMUSCULAR | Status: AC | PRN
  Administered 2012-02-18: 10 mL
  Filled 2012-02-18: qty 10

## 2012-02-18 NOTE — Patient Instructions (Signed)
Blood Transfusion Information  WHAT IS A BLOOD TRANSFUSION?  A transfusion is the replacement of blood or some of its parts. Blood is made up of multiple cells which provide different functions.   Red blood cells carry oxygen and are used for blood loss replacement.   White blood cells fight against infection.   Platelets control bleeding.   Plasma helps clot blood.   Other blood products are available for specialized needs, such as hemophilia or other clotting disorders.  BEFORE THE TRANSFUSION   Who gives blood for transfusions?    You may be able to donate blood to be used at a later date on yourself (autologous donation).   Relatives can be asked to donate blood. This is generally not any safer than if you have received blood from a stranger. The same precautions are taken to ensure safety when a relative's blood is donated.   Healthy volunteers who are fully evaluated to make sure their blood is safe. This is blood bank blood.  Transfusion therapy is the safest it has ever been in the practice of medicine. Before blood is taken from a donor, a complete history is taken to make sure that person has no history of diseases nor engages in risky social behavior (examples are intravenous drug use or sexual activity with multiple partners). The donor's travel history is screened to minimize risk of transmitting infections, such as malaria. The donated blood is tested for signs of infectious diseases, such as HIV and hepatitis. The blood is then tested to be sure it is compatible with you in order to minimize the chance of a transfusion reaction. If you or a relative donates blood, this is often done in anticipation of surgery and is not appropriate for emergency situations. It takes many days to process the donated blood.  RISKS AND COMPLICATIONS  Although transfusion therapy is very safe and saves many lives, the main dangers of transfusion include:    Getting an infectious disease.   Developing a  transfusion reaction. This is an allergic reaction to something in the blood you were given. Every precaution is taken to prevent this.  The decision to have a blood transfusion has been considered carefully by your caregiver before blood is given. Blood is not given unless the benefits outweigh the risks.  AFTER THE TRANSFUSION   Right after receiving a blood transfusion, you will usually feel much better and more energetic. This is especially true if your red blood cells have gotten low (anemic). The transfusion raises the level of the red blood cells which carry oxygen, and this usually causes an energy increase.   The nurse administering the transfusion will monitor you carefully for complications.  HOME CARE INSTRUCTIONS   No special instructions are needed after a transfusion. You may find your energy is better. Speak with your caregiver about any limitations on activity for underlying diseases you may have.  SEEK MEDICAL CARE IF:    Your condition is not improving after your transfusion.   You develop redness or irritation at the intravenous (IV) site.  SEEK IMMEDIATE MEDICAL CARE IF:   Any of the following symptoms occur over the next 12 hours:   Shaking chills.   You have a temperature by mouth above 102 F (38.9 C), not controlled by medicine.   Chest, back, or muscle pain.   People around you feel you are not acting correctly or are confused.   Shortness of breath or difficulty breathing.   Dizziness and fainting.     You get a rash or develop hives.   You have a decrease in urine output.   Your urine turns a dark color or changes to pink, red, or brown.  Any of the following symptoms occur over the next 10 days:   You have a temperature by mouth above 102 F (38.9 C), not controlled by medicine.   Shortness of breath.   Weakness after normal activity.   The white part of the eye turns yellow (jaundice).   You have a decrease in the amount of urine or are urinating less often.   Your  urine turns a dark color or changes to pink, red, or brown.  Document Released: 07/24/2000 Document Revised: 07/16/2011 Document Reviewed: 03/12/2008  ExitCare Patient Information 2012 [employer].

## 2012-02-18 NOTE — Progress Notes (Signed)
Hematology and Oncology Follow Up Visit  Andrew Nelson [id number] 1950/12/28 61 y.o. 02/18/2012 4:38 PM  CC:  [name], MD  [name], MD  [name], M.D.    Principle Diagnosis: :  This is a 61 year old gentleman with prostate cancer initially diagnosed in 2007.  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  Prior Therapy: 1. Status post laparoscopic prostatectomy and bilateral lymphadenectomy, postoperative PSA was 0.5. 2. He received salvage radiation therapy due to persistent elevation in his PSA. 3. Patient treated with Lupron and Casodex due to a rise in his PSA, subsequently treated with Casodex withdrawal. 4. 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 5. Patient treated with 10 cycles of Taxotere and prednisone.  Taxotere was reduced to 60 mg/sq m; last dose given was in October 2011.  However, PSA started to rise up to 250.  Current therapy: He is on Zytiga 1000 mg daily since October 2011. 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 His PSA was as high as 315, down to 264 and has been relatively stable. And most recently PSA is up to 420.   Interim History:  [name] presents today for a followup visit with is wife.  Since we last saw the patient, he has been evaluated by orthopedics. He is scheduled to have right hip surgery at the end of the month due to avascular necrosis. He is now off his Prednisone. He is walking with crutches today. He has continued to tolerate Zytiga very well without any major toxicity.  He had not had any abdominal pain, had not had any discomfort, had not had any fluid retention at this time.  He is less active since we last saw him due to pain in his hip and decreased mobility. Had not had any major changes in his urine flow at this time.  No new symptoms at this time.   Medications: I have reviewed the patient's current medications. Current outpatient prescriptions:abiraterone Acetate (ZYTIGA) 250 MG tablet, Take 4 tablets (1,000 mg total) by mouth daily. Take on an empty stomach 1 hour before or 2 hours after a meal, Disp: 120 tablet, Rfl: 2;  calcium-vitamin D 250-100 MG-UNIT per tablet, Take 1 tablet by mouth daily.  , Disp: , Rfl: ;  [employer] SENNA 8.6 MG tablet, TAKE 1 TABLET BY MOUTH EVERY DAY, Disp: 90 tablet, Rfl: 0 doxazosin (CARDURA) 8 MG tablet, TAKE 1 TABLET BY MOUTH EVERY DAY, Disp: 30 tablet, Rfl: 0;  fish oil-omega-3 fatty acids 1000 MG capsule, Take 1 g by mouth daily.  , Disp: , Rfl: ;  glucose blood (ACCU-CHEK ACTIVE STRIPS) test strip, Use as instructed, Disp: 100 each, Rfl: 3;  hydrochlorothiazide (HYDRODIURIL) 25 MG tablet, TAKE 1 TABLET BY MOUTH EVERY DAY, Disp: 30 tablet, Rfl: 0 leuprolide (LUPRON) 30 MG injection, Inject 30 mg into the muscle every 4 (four) months.  , Disp: , Rfl: ;  lisinopril (PRINIVIL,ZESTRIL) 10 MG tablet, TAKE 1 TABLET EVERY DAY, Disp: 30 tablet, Rfl: 0;  metFORMIN (GLUCOPHAGE) 850 MG tablet, TAKE 1 TABLET TWICE A DAY WITH A MEAL, Disp: 60 tablet, Rfl: 0;  NOVOLOG MIX 70/30 (70-30) 100 UNIT/ML injection, INJECT 20 UNITS EVERY DAY, Disp: 20 mL, Rfl: 3 No current facility-administered medications for this visit. Facility-Administered Medications Ordered in Other Visits: 0.9 %  sodium chloride  infusion, 250 mL, Intravenous, Once, Myrtis Ser, NP, Last Rate: 20 mL/hr at 02/18/12 1230, 250 mL at 02/18/12 1230;  acetaminophen (TYLENOL) tablet 650 mg, 650 mg, Oral, Once, Myrtis Ser, NP, 650 mg at 02/18/12 1310;  diphenhydrAMINE (BENADRYL) capsule 25 mg, 25 mg, Oral, Once, Myrtis Ser, NP, 25 mg at 02/18/12 1310 heparin lock flush 100 unit/mL, 500 Units, Intravenous, Once, [name], MD, 500 Units at 02/18/12 1107;  heparin lock flush 100 unit/mL, 500 Units, Intracatheter, Daily PRN,  Myrtis Ser, NP;  leuprolide (LUPRON) injection 30 mg, 30 mg, Intramuscular, Once, [name], MD, 30 mg at 02/18/12 1105;  sodium chloride 0.9 % injection 10 mL, 10 mL, Intracatheter, PRN, Myrtis Ser, NP DISCONTD: sodium chloride 0.9 % injection 10 mL, 10 mL, Intravenous, PRN, [name], MD, 10 mL at 02/18/12 1107  Allergies:  Allergies  Allergen Reactions  . Aprindine   . Bc Powder (Aspirin-Salicylamide-Caffeine)   . Penicillins     Past Medical History, Surgical history, Social history, and Family History were reviewed and updated.  Review of Systems: Constitutional:  Negative for fever, chills, night sweats, anorexia, weight loss, pain. Cardiovascular: no chest pain or dyspnea on exertion Respiratory: no cough, shortness of breath, or wheezing Neurological: negative Dermatological: negative ENT: negative Skin: Negative. Gastrointestinal: no abdominal pain, change in bowel habits, or black or bloody stools Genito-Urinary: no dysuria, trouble voiding, or hematuria Hematological and Lymphatic: negative Breast: negative Musculoskeletal: negative Remaining ROS negative.  Physical Exam: Blood pressure 134/71, pulse 109, temperature 98.9 F (37.2 C), temperature source Oral, height 5' 7.5" (1.715 m), weight 183 lb 8 oz (83.235 kg). ECOG: 1 General appearance: alert Head: Normocephalic, without obvious abnormality, atraumatic Neck: no adenopathy, no carotid bruit, no JVD, supple, symmetrical, trachea midline and thyroid not enlarged, symmetric, no tenderness/mass/nodules Lymph nodes: Cervical, supraclavicular, and axillary nodes normal. Heart:regular rate and rhythm, S1, S2 normal, no murmur, click, rub or gallop Lung:chest clear, no wheezing, rales, normal symmetric air entry Abdomin: soft, non-tender, without masses or organomegaly EXT:no erythema, induration, or nodules   Lab Results: Lab Results  Component Value Date   WBC 4.3 02/18/2012   HGB 7.5*  02/18/2012   HCT 24.6* 02/18/2012   MCV 70.7* 02/18/2012   PLT 327 02/18/2012     Chemistry      Component Value Date/Time   NA 139 01/07/2012 1027   NA 141 08/13/2011 1129   K 3.6 01/07/2012 1027   K 3.7 08/13/2011 1129   CL 103 01/07/2012 1027   CL 103 08/13/2011 1129   CO2 23 01/07/2012 1027   CO2 28 08/13/2011 1129   BUN 16 01/07/2012 1027   BUN 15 08/13/2011 1129   CREATININE 0.76 01/07/2012 1027   CREATININE 0.5* 08/13/2011 1129      Component Value Date/Time   CALCIUM 9.4 01/07/2012 1027   CALCIUM 9.3 08/13/2011 1129   ALKPHOS 74 01/07/2012 1027   ALKPHOS 76 08/13/2011 1129   AST 11 01/07/2012 1027   AST 12 08/13/2011 1129   ALT 10 01/07/2012 1027   BILITOT 0.4 01/07/2012 1027   BILITOT 0.50 08/13/2011 1129       Impression and Plan:  This is a 61 year old gentleman with the following issues: 1. 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  He is currently on Zytiga.  Again, his PSA had been relatively stable but has increased on two separate visits. I will continue his  Zytiga for now, but if his  PSA continues to go up I will switch him to [place]. He would like to stay on Zytiga for now. PSA pending today. 2. Port-A-Cath management.  Continue Port-A-Cath flush every 6-8 weeks.  Last flush on 02/18/12 3. Avascular necrosis. Likely due to Prednisone. He is due to have right hip surgery at the end of the month. 4. Anemia that is multifactorial. Hemoglobin is down to 7.5 today. He has surgery pending and recommend that he receive a blood transfusion prior to his surgery. Risks/benefits of the transfusion have been discussed and the patient is agreeable to receiving 2 units of PRBCs today. 5. Androgen deprivation.  Received Lupron today and every four months. Next treatment will be in November 2013. 6. Follow-up. In mid-August following his surgery.  Clenton Pare 7/11/20134:38 PM

## 2012-02-18 NOTE — Progress Notes (Signed)
1358-Andrew Nelson at chair side to assess patient re: temperature pre-blood.  Okay per Belenda Cruise to continue with blood transfusion-dhp, rn

## 2012-02-19 ENCOUNTER — Telehealth: Payer: Self-pay | Admitting: Oncology

## 2012-02-19 ENCOUNTER — Telehealth: Payer: Self-pay | Admitting: *Deleted

## 2012-02-19 ENCOUNTER — Encounter: Payer: Self-pay | Admitting: Oncology

## 2012-02-19 LAB — TYPE AND SCREEN: Unit division: 0

## 2012-02-19 NOTE — Telephone Encounter (Signed)
Wife called asking how long Andrew Nelson will have a fever after receiving blood yesterday.  Reports temp = 102.9 about 7:30 pm, had chills and was told to go to ER but he refused to go.  Gave tylenol last night and none today.  Today's temperature = 100.0 to 101.0.   Informed [name] of wife's call.  Asked about symptoms.  Verbal order received and read back for tylenol for fever and if symptomatic to go to ER.   Called wife and pt. Is not experiencing any chest pain, s.o.b or any symptoms with the fever.  Gave instructions as ordered by MD.

## 2012-02-19 NOTE — Progress Notes (Signed)
The results of the PSA (up to 596) discussed today with the patient. He wants to continue on Zytiga for now aad recheck his PSA with next visit, if still up then we can consider different therapy.

## 2012-02-19 NOTE — Telephone Encounter (Signed)
lmonvm for pt re appt for 8/9 and mailed schedule for August thru December,

## 2012-02-22 ENCOUNTER — Other Ambulatory Visit: Payer: Self-pay | Admitting: *Deleted

## 2012-02-22 ENCOUNTER — Telehealth: Payer: Self-pay | Admitting: *Deleted

## 2012-02-22 ENCOUNTER — Encounter (HOSPITAL_COMMUNITY): Payer: Self-pay | Admitting: Respiratory Therapy

## 2012-02-22 DIAGNOSIS — C61 Malignant neoplasm of prostate: Secondary | ICD-10-CM

## 2012-02-22 MED ORDER — ABIRATERONE ACETATE 250 MG PO TABS: 1000.0000 mg | ORAL_TABLET | Freq: Every day | ORAL | Status: DC

## 2012-02-22 NOTE — Telephone Encounter (Signed)
THIS REFILL REQUEST WAS PLACED IN DR.SHADAD'S ACTIVE WORK FOLDER. 

## 2012-02-22 NOTE — Telephone Encounter (Signed)
Received call from wife re:  Pt still has temp from  99.9  To  102.9  All weekend .   Joann wanted to know what to do.   Spoke with Chyrl Civatte and informed her that [name] was not in office today.   Instructed her to take pt to ER for further evaluation  As per md's instructions from Fri. 02/19/12.   Joann voiced understanding. Joann's  Phone    [phone].

## 2012-02-22 NOTE — Addendum Note (Signed)
Addended by: Arvilla Meres on: 02/22/2012 11:20 AM   Modules accepted: Orders

## 2012-02-23 ENCOUNTER — Encounter (HOSPITAL_COMMUNITY): Payer: Self-pay | Admitting: Emergency Medicine

## 2012-02-23 ENCOUNTER — Emergency Department (HOSPITAL_COMMUNITY)
Admission: EM | Admit: 2012-02-23 | Discharge: 2012-02-23 | Disposition: A | Discharge status: Home / Self Care | Payer: BC Managed Care – PPO | Attending: Emergency Medicine | Admitting: Emergency Medicine

## 2012-02-23 ENCOUNTER — Emergency Department (HOSPITAL_COMMUNITY): Payer: BC Managed Care – PPO

## 2012-02-23 ENCOUNTER — Other Ambulatory Visit: Payer: Self-pay | Admitting: Oncology

## 2012-02-23 DIAGNOSIS — Z79899 Other long term (current) drug therapy: Secondary | ICD-10-CM | POA: Insufficient documentation

## 2012-02-23 DIAGNOSIS — R82998 Other abnormal findings in urine: Secondary | ICD-10-CM | POA: Insufficient documentation

## 2012-02-23 DIAGNOSIS — R8271 Bacteriuria: Secondary | ICD-10-CM

## 2012-02-23 DIAGNOSIS — C61 Malignant neoplasm of prostate: Secondary | ICD-10-CM | POA: Insufficient documentation

## 2012-02-23 DIAGNOSIS — Z794 Long term (current) use of insulin: Secondary | ICD-10-CM | POA: Insufficient documentation

## 2012-02-23 DIAGNOSIS — E119 Type 2 diabetes mellitus without complications: Secondary | ICD-10-CM | POA: Insufficient documentation

## 2012-02-23 DIAGNOSIS — I10 Essential (primary) hypertension: Secondary | ICD-10-CM | POA: Insufficient documentation

## 2012-02-23 DIAGNOSIS — R509 Fever, unspecified: Secondary | ICD-10-CM | POA: Insufficient documentation

## 2012-02-23 HISTORY — DX: Essential (primary) hypertension: I10

## 2012-02-23 LAB — URINALYSIS, ROUTINE W REFLEX MICROSCOPIC
Bilirubin Urine: NEGATIVE
Glucose, UA: NEGATIVE mg/dL
Hgb urine dipstick: NEGATIVE
Ketones, ur: 40 mg/dL — AB
Specific Gravity, Urine: 1.035 — ABNORMAL HIGH (ref 1.005–1.030)
pH: 6.5 (ref 5.0–8.0)

## 2012-02-23 LAB — COMPREHENSIVE METABOLIC PANEL
ALT: 12 U/L (ref 0–53)
AST: 23 U/L (ref 0–37)
Alkaline Phosphatase: 56 U/L (ref 39–117)
GFR calc Af Amer: >90 mL/min (ref >90)
Glucose, Bld: 110 mg/dL — ABNORMAL HIGH (ref 70–99)
Potassium: 2.5 mEq/L — CL (ref 3.5–5.1)
Sodium: 135 mEq/L (ref 135–145)
Total Protein: 6.8 g/dL (ref 6.0–8.3)

## 2012-02-23 LAB — CBC WITH DIFFERENTIAL/PLATELET
Basophils Absolute: 0 10*3/uL (ref 0.0–0.1)
Basophils Relative: 0 % (ref 0–1)
HCT: 28.5 % — ABNORMAL LOW (ref 39.0–52.0)
Hemoglobin: 8.8 g/dL — ABNORMAL LOW (ref 13.0–17.0)
Lymphocytes Relative: 26 % (ref 12–46)
Monocytes Relative: 9 % (ref 3–12)
Neutro Abs: 1.8 10*3/uL (ref 1.7–7.7)
Neutrophils Relative %: 65 % (ref 43–77)
RDW: 18.4 % — ABNORMAL HIGH (ref 11.5–15.5)
WBC: 2.9 10*3/uL — ABNORMAL LOW (ref 4.0–10.5)

## 2012-02-23 LAB — URINE MICROSCOPIC-ADD ON

## 2012-02-23 MED ORDER — CIPROFLOXACIN HCL 500 MG PO TABS: 500.0000 mg | ORAL_TABLET | Freq: Two times a day (BID) | ORAL | Status: AC

## 2012-02-23 MED ORDER — POTASSIUM CHLORIDE 20 MEQ/15ML (10%) PO LIQD
60.0000 meq | Freq: Once | ORAL | Status: AC
  Administered 2012-02-23: 60 meq via ORAL
  Filled 2012-02-23: qty 15
  Filled 2012-02-23: qty 30

## 2012-02-23 MED ORDER — ACETAMINOPHEN 325 MG PO TABS
650.0000 mg | ORAL_TABLET | Freq: Once | ORAL | Status: AC
  Administered 2012-02-23: 650 mg via ORAL
  Filled 2012-02-23: qty 2

## 2012-02-23 MED ORDER — POTASSIUM CHLORIDE 10 MEQ/100ML IV SOLN
10.0000 meq | INTRAVENOUS | Status: AC
  Administered 2012-02-23 (×2): 10 meq via INTRAVENOUS
  Filled 2012-02-23 (×2): qty 100

## 2012-02-23 MED ORDER — SODIUM CHLORIDE 0.9 % IV BOLUS (SEPSIS)
500.0000 mL | Freq: Once | INTRAVENOUS | Status: AC
  Administered 2012-02-23: 500 mL via INTRAVENOUS

## 2012-02-23 NOTE — ED Notes (Signed)
Pt stated he voided prior to arrival. Urinal offered. Pt aware about the importance of collecting urine sample for lab testing.

## 2012-02-23 NOTE — Discharge Instructions (Signed)
Fever  Fever is a higher-than-normal body temperature. A normal temperature varies with:  Age.   How it is measured (mouth, underarm, rectal, or ear).   Time of day.  In an adult, an oral temperature around 98.6 Fahrenheit (F) or 37 Celsius (C) is considered normal. A rise in temperature of about 1.8 F or 1 C is generally considered a fever (100.4 F or 38 C). In an infant age 63 days or less, a rectal temperature of 100.4 F (38 C) generally is regarded as fever. Fever is not a disease but can be a symptom of illness. CAUSES   Fever is most commonly caused by infection.   Some non-infectious problems can cause fever. For example:   Some arthritis problems.   Problems with the thyroid or adrenal glands.   Immune system problems.   Some kinds of cancer.   A reaction to certain medicines.   Occasionally, the source of a fever cannot be determined. This is sometimes called a "Fever of Unknown Origin" (FUO).   Some situations may lead to a temporary rise in body temperature that may go away on its own. Examples are:   Childbirth.   Surgery.   Some situations may cause a rise in body temperature but these are not considered "true fever". Examples are:   Intense exercise.   Dehydration.   Exposure to high outside or room temperatures.  SYMPTOMS   Feeling warm or hot.   Fatigue or feeling exhausted.   Aching all over.   Chills.   Shivering.   Sweats.  DIAGNOSIS  A fever can be suspected by your caregiver feeling that your skin is unusually warm. The fever is confirmed by taking a temperature with a thermometer. Temperatures can be taken different ways. Some methods are accurate and some are not: With adults, adolescents, and children:   An oral temperature is used most commonly.   An ear thermometer will only be accurate if it is positioned as recommended by the manufacturer.   Under the arm temperatures are not accurate and not recommended.   Most  electronic thermometers are fast and accurate.  Infants and Toddlers:  Rectal temperatures are recommended and most accurate.   Ear temperatures are not accurate in this age group and are not recommended.   Skin thermometers are not accurate.  RISKS AND COMPLICATIONS   During a fever, the body uses more oxygen, so a person with a fever may develop rapid breathing or shortness of breath. This can be dangerous especially in people with heart or lung disease.   The sweats that occur following a fever can cause dehydration.   High fever can cause seizures in infants and children.   Older persons can develop confusion during a fever.  TREATMENT   Medications may be used to control temperature.   Do not give aspirin to children with fevers. There is an association with Reye's syndrome. Reye's syndrome is a rare but potentially deadly disease.   If an infection is present and medications have been prescribed, take them as directed. Finish the full course of medications until they are gone.   Sponging or bathing with room-temperature water may help reduce body temperature. Do not use ice water or alcohol sponge baths.   Do not over-bundle children in blankets or heavy clothes.   Drinking adequate fluids during an illness with fever is important to prevent dehydration.  HOME CARE INSTRUCTIONS   For adults, rest and adequate fluid intake are important. Dress according   to how you feel, but do not over-bundle.   Drink enough water and/or fluids to keep your urine clear or pale yellow.   For infants over 3 months and children, giving medication as directed by your caregiver to control fever can help with comfort. The amount to be given is based on the child's weight. Do NOT give more than is recommended.  SEEK MEDICAL CARE IF:   You or your child are unable to keep fluids down.   Vomiting or diarrhea develops.   You develop a skin rash.   An oral temperature above 102 F (38.9 C)  develops, or a fever which persists for over 3 days.   You develop excessive weakness, dizziness, fainting or extreme thirst.   Fevers keep coming back after 3 days.  SEEK IMMEDIATE MEDICAL CARE IF:   Shortness of breath or trouble breathing develops   You pass out.   You feel you are making little or no urine.   New pain develops that was not there before (such as in the head, neck, chest, back, or abdomen).   You cannot hold down fluids.   Vomiting and diarrhea persist for more than a day or two.   You develop a stiff neck and/or your eyes become sensitive to light.   An unexplained temperature above 102 F (38.9 C) develops.  Document Released: 07/27/2005 Document Revised: 07/16/2011 Document Reviewed: 07/12/2008 ExitCare Patient Information 2012 [employer].  RESOURCE GUIDE  Dental Problems  Patients with Medicaid: [facility]                     [facility] [address]                                           [address] Phone:  [phone]                                                   Phone:  [phone]  If unable to pay or uninsured, contact:  [facility] or [facility] to become qualified for the adult dental clinic.  Chronic Pain Problems Contact [facility]  [phone] Patients need to be referred by their primary care doctor.  Insufficient Money for Medicine Contact [employer]:  call "211" or [facility] 271-5999.  No Primary Care Doctor Call [facility]  832-8000 Other agencies that provide inexpensive medical care    [facility]  [phone]    [facility]  [phone]    [facility]  [phone]    [facility] Clinic  [phone]    [facility]  [phone]    [facility]  [phone]  Psychological Services [facility]  [phone] [facility]  [phone] [facility]   [phone]  (emergency services [phone])  Abuse/Neglect [place] Child Abuse Hotline (336) 641-3795 [place] Child Abuse Hotline 800-378-5315 (After Hours)  Emergency Shelter [facility] (336) 271-5985  [facility] (336) 275-9566 [facility] (704) 372-4663  MRSA Hotline #:   832-7006    [place] [facility]  [facility] [place]    [employer]                           [facility] [address] [place]                     [address]         [address]  [place]                                               [place]                              [place] Phone:  [phone]                                  Phone:  [phone]                   Phone:  [phone]  [facility] Phone:  [phone]  [place] Child Abuse Hotline (336) 342-1394 (336) 342-3537 (After Hours)  

## 2012-02-23 NOTE — ED Notes (Signed)
Pt had blood transfusion on Thursday. Had elevated temperature since transfusion. Temperature reached 103. Pt c/o weakness. Has hx of prostate cancer and anemia.

## 2012-02-23 NOTE — ED Provider Notes (Signed)
History     CSN: [id number]  Arrival date & time 02/23/12  0904   First MD Initiated Contact with Patient 02/23/12 [phone]      Chief Complaint  Patient presents with  . Fever    (Consider location/radiation/quality/duration/timing/severity/associated sxs/prior treatment) HPI  60yoM h/o prostate cancer s/p prostatectomy, radiation, chemo on zytiga pw fever. 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 States that fever to 100.7 at that time and was given tylenol. Since that time has spiked daily fever to 103. Has been taking tylenol with some relief. Denies URI sx/headache/dizziness/neckstiffness/cp/sob/fever/cough/abd pain/n/v/c/d/leg pain/rash. He does c/o fatigue, no focal weakness.   ED Notes, ED Provider Notes from 02/23/12 0000 to 02/23/12 09:26:31       De Hollingshead, RN 02/23/2012 09:19      Pt had blood transfusion on Thursday. Had elevated temperature since transfusion. Temperature reached 103. Pt c/o weakness. Has hx of prostate cancer and anemia.      Past Medical History  Diagnosis Date  . Diabetes mellitus   . Prostate ca     prostate ca dx 6/07  . Hypertension     Past Surgical History  Procedure Date  . Prostatectomy 08/11/03    Family History  Problem Relation Age of Onset  . Cancer Other     prostate  . Stroke Other     History  Substance Use Topics  . Smoking status: Former Games developer  . Smokeless tobacco: Not on file  . Alcohol Use: No    Review of Systems  All other systems reviewed and are negative.  except as noted HPI   Allergies  Aprindine; Bc powder; and Penicillins  Home Medications   Current Outpatient Rx  Name Route Sig Dispense Refill  . ABIRATERONE ACETATE 250 MG PO TABS Oral Take 4 tablets (1,000 mg total) by mouth daily. Take on an empty stomach 1 hour before or 2 hours after a meal 120 tablet 1  . CALCIUM CITRATE-VITAMIN D 250-100 MG-UNIT PO TABS Oral Take 1 tablet by mouth  daily.      Marland Kitchen DOXAZOSIN MESYLATE 8 MG PO TABS Oral Take 8 mg by mouth at bedtime.    . OMEGA-3 FATTY ACIDS 1000 MG PO CAPS Oral Take 1 g by mouth daily.      Marland Kitchen HYDROCHLOROTHIAZIDE 25 MG PO TABS Oral Take 25 mg by mouth daily.    . INSULIN ASPART PROT & ASPART (70-30) 100 UNIT/ML [place] SUSP Subcutaneous Inject 20 Units into the skin daily.    Marland Kitchen LISINOPRIL 10 MG PO TABS Oral Take 10 mg by mouth daily.    Marland Kitchen METFORMIN HCL 850 MG PO TABS Oral Take 850 mg by mouth 2 (two) times daily with a meal.    . SENNA 8.6 MG PO TABS Oral Take 1 tablet by mouth.    Marland Kitchen CIPROFLOXACIN HCL 500 MG PO TABS Oral Take 1 tablet (500 mg total) by mouth every 12 (twelve) hours. 14 tablet 0  . GLUCOSE BLOOD VI STRP  Use as instructed 100 each 3  . LEUPROLIDE ACETATE (4 MONTH) 30 MG IM KIT Intramuscular Inject 30 mg into the muscle every 4 (four) months.      Marland Kitchen LIDOCAINE-PRILOCAINE 2.5-2.5 % EX CREA  APPLY TO PORTA-CATH SITE 30 MINS TO 2 HOURS PRIOR TO TREATMENT & COVER WITH PLASTIC WRAP 30 g 0    BP 141/90  Pulse 66  Temp 99.7 F (37.6 C) (Oral)  Resp 14  SpO2 96%  Physical Exam  Nursing note and vitals reviewed. Constitutional: He is oriented to person, place, and time. He appears well-developed and well-nourished. No distress.  HENT:  Head: Atraumatic.  Mouth/Throat: Oropharynx is clear and moist.       Tm wnl b/l Posterior OP unremarkable  Eyes: Conjunctivae are normal. Pupils are equal, round, and reactive to light.  Neck: Neck supple.       No meningismus  Cardiovascular: Normal rate, regular rhythm, normal heart sounds and intact distal pulses.  Exam reveals no gallop and no friction rub.   No murmur heard. Pulmonary/Chest: Effort normal. No respiratory distress. He has no wheezes. He has no rales.  Abdominal: Soft. Bowel sounds are normal. There is no tenderness. There is no rebound and no guarding.  Musculoskeletal: Normal range of motion. He exhibits no edema and no tenderness.  Neurological: He is alert  and oriented to person, place, and time.  Skin: Skin is warm and dry.  Psychiatric: He has a normal mood and affect.    ED Course  Procedures (including critical care time)  Labs Reviewed  CBC WITH DIFFERENTIAL - Abnormal; Notable for the following:    WBC 2.9 (*)     RBC 3.93 (*)     Hemoglobin 8.8 (*)     HCT 28.5 (*)     MCV 72.5 (*)     MCH 22.4 (*)     RDW 18.4 (*)     All other components within normal limits  COMPREHENSIVE METABOLIC PANEL - Abnormal; Notable for the following:    Potassium 2.5 (*)     Glucose, Bld 110 (*)     Albumin 2.9 (*)     All other components within normal limits  URINALYSIS, ROUTINE W REFLEX MICROSCOPIC - Abnormal; Notable for the following:    Color, Urine AMBER (*)  BIOCHEMICALS MAY BE AFFECTED BY COLOR   Specific Gravity, Urine 1.035 (*)     Ketones, ur 40 (*)     Protein, ur 100 (*)     All other components within normal limits  URINE MICROSCOPIC-ADD ON - Abnormal; Notable for the following:    Bacteria, UA FEW (*)     All other components within normal limits  OCCULT BLOOD, POC DEVICE  CULTURE, BLOOD (ROUTINE X 2)  CULTURE, BLOOD (ROUTINE X 2)  URINE CULTURE   Dg Chest 2 View  02/23/2012  *RADIOLOGY REPORT*  Clinical Data: Fever.  CHEST - 2 VIEW  Comparison: Chest CT 08/13/2011  Findings: Right Port-A-Cath is in place with the tip in the SVC. Heart is normal size.  New small bilateral pleural effusions seen only on the lateral view.  No focal airspace opacities within the lungs.  No bony abnormality.  IMPRESSION: Small bilateral pleural effusions.  Original Report Authenticated By: Cyndie Chime, M.D.    1. Fever   2. Prostate cancer   3. Bacteria in urine    MDM  60yoM h/o prostate cancer on zytiga only pw persistent fever. Nonspec by history. Exam non focal. CXR without infiltrate. +Bacteria in urine without LE or nitrite. No UTI like sx. Sent for culture. Will treat with cipro. Potassium repletion. D/W [facility]ON[facility] on call. No  neutropenia- likely f/u as outpatient. D/W patient. He declines obs admission in lieu of going home and outpatient f/u with [name]. Fever improved in ED. VSS. No EMC precluding discharge at this time. Given Precautions for return. PMD f/u.   BP 141/90  Pulse 66  Temp 99.7 F (37.6 C) (Oral)  Resp 14  SpO2 96%         Forbes Cellar, MD 02/23/12 1315

## 2012-02-24 LAB — URINE CULTURE: Colony Count: 7000

## 2012-02-25 ENCOUNTER — Ambulatory Visit (INDEPENDENT_AMBULATORY_CARE_PROVIDER_SITE_OTHER): Payer: BC Managed Care – PPO | Admitting: Family Medicine

## 2012-02-25 ENCOUNTER — Ambulatory Visit: Payer: BC Managed Care – PPO

## 2012-02-25 ENCOUNTER — Encounter: Payer: Self-pay | Admitting: Family Medicine

## 2012-02-25 VITALS — BP 120/84 | Temp 98.1°F | Wt 183.0 lb

## 2012-02-25 DIAGNOSIS — I10 Essential (primary) hypertension: Secondary | ICD-10-CM

## 2012-02-25 DIAGNOSIS — M87051 Idiopathic aseptic necrosis of right femur: Secondary | ICD-10-CM | POA: Insufficient documentation

## 2012-02-25 DIAGNOSIS — Z01818 Encounter for other preprocedural examination: Secondary | ICD-10-CM

## 2012-02-25 DIAGNOSIS — C61 Malignant neoplasm of prostate: Secondary | ICD-10-CM

## 2012-02-25 DIAGNOSIS — Z8546 Personal history of malignant neoplasm of prostate: Secondary | ICD-10-CM

## 2012-02-25 DIAGNOSIS — E109 Type 1 diabetes mellitus without complications: Secondary | ICD-10-CM

## 2012-02-25 DIAGNOSIS — D649 Anemia, unspecified: Secondary | ICD-10-CM

## 2012-02-25 DIAGNOSIS — M87059 Idiopathic aseptic necrosis of unspecified femur: Secondary | ICD-10-CM

## 2012-02-25 DIAGNOSIS — Z23 Encounter for immunization: Secondary | ICD-10-CM

## 2012-02-25 MED ORDER — HEPARIN SOD (PORK) LOCK FLUSH 100 UNIT/ML IV SOLN
500.0000 [IU] | Freq: Once | INTRAVENOUS | Status: AC
  Filled 2012-02-25: qty 5

## 2012-02-25 MED ORDER — SODIUM CHLORIDE 0.9 % IJ SOLN
10.0000 mL | INTRAMUSCULAR | Status: AC | PRN
  Filled 2012-02-25: qty 10

## 2012-02-25 NOTE — Progress Notes (Signed)
  Subjective:    Patient ID: Andrew Nelson, male    DOB: 1951/03/03, 61 y.o.   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 He's scheduled for surgery August 14  He's had no previous history of cardiac nor pulmonary disease. He's a next smoker.  He was transfused 2 units of packed cells last Thursday  He takes insulin 20 units daily and metformin 850 mg twice a day for diabetes blood sugars are in the 150-160 range  He is on Cardura 8 mg, Hydrocort thiazide 25 mg, lisinopril 10 mg and BP is 120/84. He's currently on Cipro 500 mg twice a day from his urologist for urinary tract infection this was started 3 days ago   Review of Systems    general review of systems otherwise negative Objective:   Physical Exam  Well-developed well-nourished male in no acute distress lungs were clear to auscultation cardiac exam was normal        Assessment & Plan:  Diabetes type 1 check labs  Hypertension continue current medication  Avascular necrosis right hip surgery scheduled August 14,,,,,,,,,,, I think he'll be okay for surgery he understands the risks and potential complications but the long-term benefits I think are greater than the risk of the surgery.  Anemia check CBC  He may need more red cells prior to surgery  Prostate cancer undergoing therapy  Recent urinary tract infection continue Cipro 500 twice a day

## 2012-02-25 NOTE — Progress Notes (Unsigned)
Excellent blood return from right chest portacath today.  Labs drawn as ordered.

## 2012-02-25 NOTE — Patient Instructions (Addendum)
Continue your current medications  We will send a copy of your lab work along with surgical clearance to your surgeon Dr. Lacretia Nicks............. next week  Your EKG looks normal I think your weight is just rapid. The machine read your EKG is atrial flutter but after reviewing your cardiogram with Dr. Kirtland Bouchard. we both agree its regular, but rapid,,,,,,,,, not flutter

## 2012-02-26 ENCOUNTER — Encounter: Payer: Self-pay | Admitting: *Deleted

## 2012-02-26 ENCOUNTER — Telehealth: Payer: Self-pay | Admitting: Family Medicine

## 2012-02-26 NOTE — Telephone Encounter (Signed)
[facility] Triage Call Report Triage Record Num: [id number] Operator: Gypsy Decant Patient Name: Andrew Nelson Call Date & Time: 02/25/2012 11:27:30PM Patient Phone: [phone] PCP: [name]. Todd Patient Gender: Male PCP Fax : [phone] Patient DOB: Jul 27, 1951 Practice Name: [facility] Reason for Call: Caller: Tammy; PCP: [name].; CB#: [phone]; Call regarding Delaney Meigs is calling from [facility] regarding a BMP ordered on Deniro, Norell by Kelle Darting A..; Stat lab- BMP Na 136 K+ 3.4L ( this is not an Alert Low per Tammy ) [place] 100 Co2-26 Glucose - 94 BUN - 6 Creatinine - 0.57 Calcium 9.2 Anion Gap - 10; There is no note left for call back to this pt. Tammy states that there is no value that they would normally call us for regarding this pt and there is not a clear understanding as to why the previous Lab tech/Tamara called this lab. Review of Epic chart shows pt was in office on 02/25/2012 and seen for pre-op for knee surgery. He has Anemia and Type 1 Diabetes. He had no active c/o at this time. Currently undergoing tx for Prostate Cancer. Will forward results to the office and not disturb this physician oncall. Protocol(s) Used: PCP Calls, No Triage (Adult) Recommended Outcome per Protocol: Call Provider within 24 Hours Reason for Outcome: Lab calling with test results Care Advice: ~ 07/

## 2012-02-26 NOTE — Progress Notes (Signed)
RECEIVED A FAX FROM [facility] CONCERNING A CONFIRMATION OF PT.'S PRESCRIPTION BENEFITS. 

## 2012-02-29 ENCOUNTER — Telehealth: Payer: Self-pay | Admitting: *Deleted

## 2012-02-29 LAB — CULTURE, BLOOD (ROUTINE X 2): Culture: NO GROWTH

## 2012-02-29 NOTE — Telephone Encounter (Signed)
Spoke with [employer].  [phone] ext. [phone].  Per patient, still taking Zytiga and will continue to do so, at least until office visit 03/18/2012.  Confirmed this with [facility] , no changes at this time.  Both patient and pharmacy verbalized understanding.

## 2012-03-01 ENCOUNTER — Other Ambulatory Visit: Payer: Self-pay | Admitting: Family Medicine

## 2012-03-07 ENCOUNTER — Ambulatory Visit (HOSPITAL_COMMUNITY)
Admission: RE | Admit: 2012-03-07 | Payer: BC Managed Care – PPO | Source: Ambulatory Visit | Admitting: Orthopedic Surgery

## 2012-03-07 ENCOUNTER — Encounter (HOSPITAL_COMMUNITY): Admission: RE | Payer: Self-pay | Source: Ambulatory Visit

## 2012-03-07 SURGERY — ARTHROPLASTY, HIP, TOTAL,POSTERIOR APPROACH
Anesthesia: General | Laterality: Right

## 2012-03-18 ENCOUNTER — Encounter: Payer: Self-pay | Admitting: Oncology

## 2012-03-18 ENCOUNTER — Other Ambulatory Visit (HOSPITAL_BASED_OUTPATIENT_CLINIC_OR_DEPARTMENT_OTHER): Payer: BC Managed Care – PPO | Admitting: Lab

## 2012-03-18 ENCOUNTER — Ambulatory Visit (HOSPITAL_BASED_OUTPATIENT_CLINIC_OR_DEPARTMENT_OTHER): Payer: BC Managed Care – PPO | Admitting: Oncology

## 2012-03-18 ENCOUNTER — Other Ambulatory Visit (HOSPITAL_COMMUNITY): Payer: BC Managed Care – PPO

## 2012-03-18 ENCOUNTER — Ambulatory Visit: Payer: BC Managed Care – PPO

## 2012-03-18 VITALS — BP 122/75 | HR 112 | Temp 98.9°F

## 2012-03-18 DIAGNOSIS — D649 Anemia, unspecified: Secondary | ICD-10-CM

## 2012-03-18 DIAGNOSIS — D4989 Neoplasm of unspecified behavior of other specified sites: Secondary | ICD-10-CM

## 2012-03-18 DIAGNOSIS — C61 Malignant neoplasm of prostate: Secondary | ICD-10-CM

## 2012-03-18 DIAGNOSIS — C779 Secondary and unspecified malignant neoplasm of lymph node, unspecified: Secondary | ICD-10-CM

## 2012-03-18 DIAGNOSIS — Z8546 Personal history of malignant neoplasm of prostate: Secondary | ICD-10-CM

## 2012-03-18 LAB — COMPREHENSIVE METABOLIC PANEL
Albumin: 3.6 g/dL (ref 3.5–5.2)
Alkaline Phosphatase: 74 U/L (ref 39–117)
Glucose, Bld: 178 mg/dL — ABNORMAL HIGH (ref 70–99)
Potassium: 3 mEq/L — ABNORMAL LOW (ref 3.5–5.3)
Sodium: 135 mEq/L (ref 135–145)
Total Protein: 7.2 g/dL (ref 6.0–8.3)

## 2012-03-18 LAB — CBC WITH DIFFERENTIAL/PLATELET
Basophils Absolute: 0 10*3/uL (ref 0.0–0.1)
EOS%: 1.9 % (ref 0.0–7.0)
Eosinophils Absolute: 0.1 10*3/uL (ref 0.0–0.5)
HGB: 9 g/dL — ABNORMAL LOW (ref 13.0–17.1)
MCH: 22.4 pg — ABNORMAL LOW (ref 27.2–33.4)
MONO#: 0.3 10*3/uL (ref 0.1–0.9)
NEUT#: 2.3 10*3/uL (ref 1.5–6.5)
RDW: 19 % — ABNORMAL HIGH (ref 11.0–14.6)
WBC: 3.8 10*3/uL — ABNORMAL LOW (ref 4.0–10.3)
lymph#: 1.2 10*3/uL (ref 0.9–3.3)

## 2012-03-18 MED ORDER — HEPARIN SOD (PORK) LOCK FLUSH 100 UNIT/ML IV SOLN
500.0000 [IU] | Freq: Once | INTRAVENOUS | Status: AC
  Administered 2012-03-18: 500 [IU] via INTRAVENOUS
  Filled 2012-03-18: qty 5

## 2012-03-18 MED ORDER — SODIUM CHLORIDE 0.9 % IJ SOLN
10.0000 mL | INTRAMUSCULAR | Status: DC | PRN
  Administered 2012-03-18: 10 mL via INTRAVENOUS
  Filled 2012-03-18: qty 10

## 2012-03-18 NOTE — Patient Instructions (Signed)
Call MD with any problems 

## 2012-03-18 NOTE — Progress Notes (Signed)
Hematology and Oncology Follow Up Visit  Andrew Nelson [id number] August 08, 1951 61 y.o. 03/18/2012 4:41 PM  CC:  [name], MD  [name], MD  [name], M.D.    Principle Diagnosis: This is a 61 year old gentleman with prostate cancer initially diagnosed in 2007.  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  Prior Therapy: 1. Status post laparoscopic prostatectomy and bilateral lymphadenectomy, postoperative PSA was 0.5. 2. He received salvage radiation therapy due to persistent elevation in his PSA. 3. Patient treated with Lupron and Casodex due to a rise in his PSA, subsequently treated with Casodex withdrawal. 4. 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 5. Patient treated with 10 cycles of Taxotere and prednisone.  Taxotere was reduced to 60 mg/sq m; last dose given was in October 2011.  However, PSA started to rise up to 250.  Current therapy: He is on Zytiga 1000 mg daily since October 2011. 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 His PSA was as high as 315, down to 264 and has been relatively stable. And most recently PSA is up to 596.   Interim History:  Andrew Nelson presents today for a followup visit with is wife. He is doing about the same. Still ambulating with the help of crutches. He is scheduled to have right hip surgery in the near future due to avascular necrosis. He has continued to tolerate Zytiga very well without any major toxicity.  He had not had any abdominal pain, had not had any discomfort, had not had any fluid retention at this time.  He is less active since we last saw him due to pain in his hip and decreased mobility. Had not had any major changes in his urine flow at this time. No new symptoms at this time.  He was diagnosed with UTI last month and  no longer reporting fevers at this time.  He is reporting fatigue however that has not improved.   Medications: I have reviewed the patient's current medications. Current outpatient prescriptions:abiraterone Acetate (ZYTIGA) 250 MG tablet, Take 4 tablets (1,000 mg total) by mouth daily. Take on an empty stomach 1 hour before or 2 hours after a meal, Disp: 120 tablet, Rfl: 1;  calcium-vitamin D 250-100 MG-UNIT per tablet, Take 1 tablet by mouth daily.  , Disp: , Rfl: ;  doxazosin (CARDURA) 8 MG tablet, Take 8 mg by mouth at bedtime., Disp: , Rfl:  doxazosin (CARDURA) 8 MG tablet, TAKE 1 TABLET BY MOUTH EVERY DAY, Disp: 90 tablet, Rfl: 3;  fish oil-omega-3 fatty acids 1000 MG capsule, Take 1 g by mouth daily.  , Disp: , Rfl: ;  glucose blood (ACCU-CHEK ACTIVE STRIPS) test strip, Use as instructed, Disp: 100 each, Rfl: 3;  hydrochlorothiazide (HYDRODIURIL) 25 MG tablet, Take 25 mg by mouth daily., Disp: , Rfl:  hydrochlorothiazide (HYDRODIURIL) 25 MG tablet, TAKE 1 TABLET BY MOUTH ONCE DAILY, Disp: 90 tablet, Rfl: 3;  insulin aspart protamine-insulin aspart (NOVOLOG 70/30) (70-30) 100 UNIT/ML injection, Inject 20 Units into the skin daily., Disp: , Rfl: ;  leuprolide (LUPRON) 30 MG injection, Inject 30 mg into the muscle every 4 (four) months.  , Disp: , Rfl:  lidocaine-prilocaine (EMLA) cream, APPLY TO PORTA-CATH SITE 30 MINS TO 2 HOURS PRIOR TO TREATMENT & COVER WITH PLASTIC WRAP, Disp: 30 g, Rfl: 0;  lisinopril (PRINIVIL,ZESTRIL) 10 MG tablet, Take 10 mg by mouth daily., Disp: , Rfl: ;  lisinopril (PRINIVIL,ZESTRIL) 10 MG tablet, TAKE 1 TABLET BY MOUTH ONCE DAILY, Disp: 90 tablet, Rfl: 3;  metFORMIN (GLUCOPHAGE) 850 MG tablet, Take 850 mg by mouth 2 (two) times daily with a meal., Disp: , Rfl:  metFORMIN (GLUCOPHAGE) 850 MG tablet, TAKE 1 TABLET BY MOUTH TWICE DAILY WITH A MEAL, Disp: 180 tablet, Rfl: 3;  oxyCODONE-acetaminophen (PERCOCET) 10-325 MG per tablet, , Disp: , Rfl: ;  senna (SENOKOT) 8.6 MG TABS,  Take 1 tablet by mouth., Disp: , Rfl:  No current facility-administered medications for this visit. Facility-Administered Medications Ordered in Other Visits: heparin lock flush 100 unit/mL, 500 Units, Intravenous, Once, [name], MD;  heparin lock flush 100 unit/mL, 500 Units, Intravenous, Once, [name], MD, 500 Units at 03/18/12 1605;  sodium chloride 0.9 % injection 10 mL, 10 mL, Intravenous, PRN, [name], MD DISCONTD: sodium chloride 0.9 % injection 10 mL, 10 mL, Intravenous, PRN, [name], MD, 10 mL at 03/18/12 1605  Allergies:  Allergies  Allergen Reactions  . Aprindine   . Bc Powder (Aspirin-Salicylamide-Caffeine)   . Penicillins     Past Medical History, Surgical history, Social history, and Family History were reviewed and updated.  Review of Systems: Constitutional:  Negative for fever, chills, night sweats, anorexia, weight loss, pain. Cardiovascular: no chest pain or dyspnea on exertion Respiratory: no cough, shortness of breath, or wheezing Neurological: negative Dermatological: negative ENT: negative Skin: Negative. Gastrointestinal: no abdominal pain, change in bowel habits, or black or bloody stools Genito-Urinary: no dysuria, trouble voiding, or hematuria Hematological and Lymphatic: negative Breast: negative Musculoskeletal: negative Remaining ROS negative.  Physical Exam: There were no vitals taken for this visit. ECOG: 1 General appearance: alert Head: Normocephalic, without obvious abnormality, atraumatic Neck: no adenopathy, no carotid bruit, no JVD, supple, symmetrical, trachea midline and thyroid not enlarged, symmetric, no tenderness/mass/nodules Lymph nodes: Cervical, supraclavicular, and axillary nodes normal. Heart:regular rate and rhythm, S1, S2 normal, no murmur, click, rub or gallop Lung:chest clear, no wheezing, rales, normal symmetric air entry Abdomin: soft, non-tender, without masses or organomegaly EXT:no erythema,  induration, or nodules   Lab Results: Lab Results  Component Value Date   WBC 3.8* 03/18/2012   HGB 9.0* 03/18/2012   HCT 28.6* 03/18/2012   MCV 71.1* 03/18/2012   PLT 266 03/18/2012     Chemistry      Component Value Date/Time   NA 135 02/23/2012 0945   NA 141 08/13/2011 1129   K 2.5* 02/23/2012 0945   K 3.7 08/13/2011 1129   CL 97 02/23/2012 0945   CL 103 08/13/2011 1129   CO2 27 02/23/2012 0945   CO2 28 08/13/2011 1129   BUN 9 02/23/2012 0945   BUN 15 08/13/2011 1129   CREATININE 0.61 02/23/2012 0945   CREATININE 0.5* 08/13/2011 1129      Component Value Date/Time   CALCIUM 8.5 02/23/2012 0945   CALCIUM 9.3 08/13/2011 1129   ALKPHOS 56 02/23/2012 0945   ALKPHOS 76 08/13/2011 1129   AST 23 02/23/2012 0945   AST 12 08/13/2011 1129   ALT 12 02/23/2012 0945   BILITOT 0.4 02/23/2012 0945   BILITOT 0.50 08/13/2011 1129       Impression and Plan:  This is a 61 year old gentleman with the following issues: 1. 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  He is currently on Zytiga.  Again, his PSA had been relatively stable but has increased on three separate visits. I will continue his Zytiga for now, but if his  PSA continues to go up I will switch him to [place]. He would like to stay on Zytiga for now. PSA pending today. 2. Port-A-Cath management.  Continue Port-A-Cath flush every 6-8 weeks.  Last flush  Today on 8/9 3. Avascular necrosis. Likely due to Prednisone. He is due to have right hip surgery in the near future. There is no oncological reason why he can not have it at this time.  4. Anemia that is multifactorial. Hemoglobin is better after transfusion last months.  5. Androgen deprivation.  Received Lupron today and every four months. Next treatment will be in November 2013. 6. Follow-up. In 9/11.   Andrew Nelson 8/9/20134:41 PM

## 2012-03-18 NOTE — Progress Notes (Signed)
Letter dictated

## 2012-03-21 ENCOUNTER — Telehealth: Payer: Self-pay | Admitting: Family Medicine

## 2012-03-21 ENCOUNTER — Telehealth: Payer: Self-pay | Admitting: *Deleted

## 2012-03-21 NOTE — Letter (Signed)
March 18, 2012     NAME:  Andrew Nelson, Andrew Nelson MRN:  [id number] DOB:  1950-10-03  To Whom It May Concern:  This letter is on behalf of Andrew Nelson.  Andrew Nelson is a patient here at the [facility] and has been since 2007. Andrew Nelson has been diagnosed with cancer and he is currently undergoing treatment for his cancer.  That treatment constitutes chemotherapy as well as potential surgery in the near future.  Because of his treatment, his cancer, and possible upcoming surgery, Andrew Nelson' ability to perform a lot of work-related duties is very limited.  Because of his cancer diagnosis, he is not able to sit for any extended period of time, as well as his mobility is quite limited.  For the reasons mentioned above, I ask for him to be kindly excused from his jury duty scheduled for 04/28/2012.  Should you have questions regarding this letter, do not hesitate to contact me.  Sincerely yours,    [name], M.D.  FNS/MEDQ  D:  03/18/2012  T:  03/18/2012  Job:  [id number]

## 2012-03-21 NOTE — Telephone Encounter (Signed)
Conni Slipper, Nurse with [facility] calling.  Asks for labs (PSA and CBC, A1C) on Andrew Nelson DOB [birth year].  Callback number is [phone] extension [phone].  Information to office for follow-up per PCP Calls protocol.

## 2012-03-21 NOTE — Telephone Encounter (Signed)
Gave patient appointment for 04-20-2012 05-17-2012 06-28-2012 starting at 10:15am printed out calendar and gave to the patient

## 2012-03-23 ENCOUNTER — Encounter: Payer: Self-pay | Admitting: Oncology

## 2012-03-23 ENCOUNTER — Other Ambulatory Visit: Payer: Self-pay | Admitting: Oncology

## 2012-03-23 DIAGNOSIS — C61 Malignant neoplasm of prostate: Secondary | ICD-10-CM

## 2012-03-23 MED ORDER — ENZALUTAMIDE 40 MG PO CAPS: 160.0000 mg | ORAL_CAPSULE | Freq: Every day | ORAL | Status: DC

## 2012-03-23 NOTE — Telephone Encounter (Signed)
Faxed

## 2012-03-23 NOTE — Progress Notes (Signed)
Faxed xtandi prescription to [facility] °

## 2012-03-24 ENCOUNTER — Encounter: Payer: Self-pay | Admitting: Oncology

## 2012-03-24 NOTE — Progress Notes (Signed)
Per Judie Grieve @ [facility] the patient's copay for xtandi is $200, which he expressed he can not afford.  I spoke with his wife and explained that I can apply for copay assistance through the drug company but I need proof of income.  She said when they find something showing his social security they will bring it to me.

## 2012-03-30 ENCOUNTER — Encounter: Payer: Self-pay | Admitting: Dietician

## 2012-03-30 NOTE — Progress Notes (Signed)
Brief Out-patient Oncology Nutrition Note  Reason: Patient Screened Positive For Nutrition Risk For Unintentional Weight Loss and Decreased Appetite.   Andrew Nelson is a 61 year old male patient of [name], diagnosed with prostate cancer. Attempted to contact patient via telephone for positive nutrition risk. Left patient voicemail with RD contact information and instructed patient to contact RD for future questions.   RD available for nutrition needs.   Iven Finn, MS, RD, LDN [phone]

## 2012-04-05 ENCOUNTER — Other Ambulatory Visit: Payer: Self-pay | Admitting: *Deleted

## 2012-04-05 NOTE — Telephone Encounter (Addendum)
THIS REFILL REQUEST FOR ZYTIGA WAS PLACED ON HOLD WITH THE PHARMACY. PT. IS GOING FOR A SECOND OPINION.

## 2012-04-12 ENCOUNTER — Encounter: Payer: Self-pay | Admitting: Oncology

## 2012-04-12 NOTE — Progress Notes (Signed)
Faxed copay assistance application to [employer] @ [phone].

## 2012-04-13 ENCOUNTER — Encounter: Payer: Self-pay | Admitting: Oncology

## 2012-04-13 NOTE — Progress Notes (Signed)
Patient is approved for xtandi copay assistance.  Called [facility] to give his approval information.  Bin# [id number], PCN# [id number], Group # [id number], Issuer# [id number], ID # [id number].

## 2012-04-20 ENCOUNTER — Ambulatory Visit (HOSPITAL_BASED_OUTPATIENT_CLINIC_OR_DEPARTMENT_OTHER): Payer: BC Managed Care – PPO

## 2012-04-20 ENCOUNTER — Ambulatory Visit (HOSPITAL_BASED_OUTPATIENT_CLINIC_OR_DEPARTMENT_OTHER): Payer: BC Managed Care – PPO | Admitting: Oncology

## 2012-04-20 ENCOUNTER — Other Ambulatory Visit (HOSPITAL_BASED_OUTPATIENT_CLINIC_OR_DEPARTMENT_OTHER): Payer: BC Managed Care – PPO | Admitting: Lab

## 2012-04-20 VITALS — BP 147/81 | HR 117 | Temp 98.5°F | Resp 18 | Ht 67.5 in | Wt 175.8 lb

## 2012-04-20 DIAGNOSIS — D649 Anemia, unspecified: Secondary | ICD-10-CM

## 2012-04-20 DIAGNOSIS — C61 Malignant neoplasm of prostate: Secondary | ICD-10-CM

## 2012-04-20 DIAGNOSIS — C775 Secondary and unspecified malignant neoplasm of intrapelvic lymph nodes: Secondary | ICD-10-CM

## 2012-04-20 DIAGNOSIS — Z8546 Personal history of malignant neoplasm of prostate: Secondary | ICD-10-CM

## 2012-04-20 LAB — CBC WITH DIFFERENTIAL/PLATELET
BASO%: 1 % (ref 0.0–2.0)
Basophils Absolute: 0.1 10*3/uL (ref 0.0–0.1)
EOS%: 2 % (ref 0.0–7.0)
HCT: 31.1 % — ABNORMAL LOW (ref 38.4–49.9)
HGB: 9.2 g/dL — ABNORMAL LOW (ref 13.0–17.1)
LYMPH%: 31 % (ref 14.0–49.0)
MCH: 21.7 pg — ABNORMAL LOW (ref 27.2–33.4)
MCHC: 29.6 g/dL — ABNORMAL LOW (ref 32.0–36.0)
NEUT%: 59.3 % (ref 39.0–75.0)
Platelets: 380 10*3/uL (ref 140–400)
lymph#: 1.8 10*3/uL (ref 0.9–3.3)

## 2012-04-20 LAB — COMPREHENSIVE METABOLIC PANEL (CC13)
Albumin: 3.7 g/dL (ref 3.5–5.0)
BUN: 12 mg/dL (ref 7.0–26.0)
CO2: 20 mEq/L — ABNORMAL LOW (ref 22–29)
Glucose: 145 mg/dl — ABNORMAL HIGH (ref 70–99)
Potassium: 3.9 mEq/L (ref 3.5–5.1)
Sodium: 141 mEq/L (ref 136–145)
Total Bilirubin: 0.3 mg/dL (ref 0.20–1.20)
Total Protein: 7.2 g/dL (ref 6.4–8.3)

## 2012-04-20 MED ORDER — HEPARIN SOD (PORK) LOCK FLUSH 100 UNIT/ML IV SOLN
500.0000 [IU] | Freq: Once | INTRAVENOUS | Status: AC
  Administered 2012-04-20: 500 [IU] via INTRAVENOUS
  Filled 2012-04-20: qty 5

## 2012-04-20 MED ORDER — SODIUM CHLORIDE 0.9 % IJ SOLN
10.0000 mL | INTRAMUSCULAR | Status: DC | PRN
  Administered 2012-04-20: 10 mL via INTRAVENOUS
  Filled 2012-04-20: qty 10

## 2012-04-20 NOTE — Patient Instructions (Addendum)
Results for ROMELL, WOLDEN (MRN [id number]) as of 04/20/2012 10:44  Ref. Range 06/11/2011 10:38 08/13/2011 11:29 10/08/2011 08:34 12/01/2011 13:35 01/07/2012 10:27 02/18/2012 10:51 03/18/2012 15:41  PSA Latest Range: <=4.00 ng/mL 258.80 (H) 235.10 (H) 318.90 (H) 406.50 (H) 420.50 (H) 596.50 (H) 630.40 (H)

## 2012-04-20 NOTE — Progress Notes (Signed)
Hematology and Oncology Follow Up Visit  Andrew Nelson [id number] [birth year] 61 y.o. 04/20/2012 4:19 PM  CC:  [name], MD  [name], MD  [name], M.D.    Principle Diagnosis: This is a 61 year old gentleman with prostate cancer initially diagnosed in 2007.  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  Prior Therapy: 1. Status post laparoscopic prostatectomy and bilateral lymphadenectomy, postoperative PSA was 0.5. 2. He received salvage radiation therapy due to persistent elevation in his PSA. 3. Patient treated with Lupron and Casodex due to a rise in his PSA, subsequently treated with Casodex withdrawal. 4. 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 5. Patient treated with 10 cycles of Taxotere and prednisone.  Taxotere was reduced to 60 mg/sq m; last dose given was in October 2011.  However, PSA started to rise up to 250. 6. Zytiga 1000 mg daily beginning October 2011 through August 2013. 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 Zytiga was stopped due to a rising PSA up to 630.40 on 03/18/12.  Current therapy: Xtandi 160 mg daily started on 04/13/12.   Interim History:  [name] presents today for a followup visit with is wife. He is doing about the same. Still ambulating with the help of crutches. He is scheduled to have right hip surgery in the near future due to avascular necrosis. Started Xtandi about 1 week ago and is tolerating this well.  He had not had any abdominal pain, had not had any discomfort, had not had any fluid retention at this time.  He is less active since we last saw him due to pain in his hip and decreased mobility. Had not had any major changes in his urine flow at this time. No new symptoms at this time. No seizure activity. No  abdominal pain, nausea, or vomiting. No bleeding.  Medications: I have reviewed the patient's current medications. Current outpatient prescriptions:calcium-vitamin D 250-100 MG-UNIT per tablet, Take 1 tablet by mouth daily.  , Disp: , Rfl: ;  doxazosin (CARDURA) 8 MG tablet, Take 8 mg by mouth at bedtime., Disp: , Rfl: ;  doxazosin (CARDURA) 8 MG tablet, TAKE 1 TABLET BY MOUTH EVERY DAY, Disp: 90 tablet, Rfl: 3;  enzalutamide (XTANDI) 40 MG capsule, Take 4 capsules (160 mg total) by mouth daily., Disp: 120 capsule, Rfl: 0 fish oil-omega-3 fatty acids 1000 MG capsule, Take 1 g by mouth daily.  , Disp: , Rfl: ;  glucose blood (ACCU-CHEK ACTIVE STRIPS) test strip, Use as instructed, Disp: 100 each, Rfl: 3;  hydrochlorothiazide (HYDRODIURIL) 25 MG tablet, Take 25 mg by mouth daily., Disp: , Rfl: ;  hydrochlorothiazide (HYDRODIURIL) 25 MG tablet, TAKE 1 TABLET BY MOUTH ONCE DAILY, Disp: 90 tablet, Rfl: 3 insulin aspart protamine-insulin aspart (NOVOLOG 70/30) (70-30) 100 UNIT/ML injection, Inject 20 Units into the skin daily., Disp: , Rfl: ;  leuprolide (LUPRON) 30 MG injection, Inject 30 mg into the muscle every 4 (four) months.  , Disp: , Rfl: ;  lidocaine-prilocaine (EMLA) cream, APPLY TO PORTA-CATH SITE 30 MINS TO 2 HOURS PRIOR TO TREATMENT & COVER WITH PLASTIC WRAP, Disp: 30 g, Rfl: 0 lisinopril (PRINIVIL,ZESTRIL) 10 MG tablet, Take 10 mg by mouth daily., Disp: , Rfl: ;  lisinopril (PRINIVIL,ZESTRIL) 10 MG tablet, TAKE 1 TABLET BY MOUTH ONCE DAILY, Disp: 90 tablet, Rfl:  3;  metFORMIN (GLUCOPHAGE) 850 MG tablet, Take 850 mg by mouth 2 (two) times daily with a meal., Disp: , Rfl: ;  metFORMIN (GLUCOPHAGE) 850 MG tablet, TAKE 1 TABLET BY MOUTH TWICE DAILY WITH A MEAL, Disp: 180 tablet, Rfl: 3 oxyCODONE-acetaminophen (PERCOCET) 10-325 MG per tablet, , Disp: , Rfl: ;  senna (SENOKOT) 8.6 MG TABS, Take 1 tablet by mouth., Disp: , Rfl:  No current facility-administered medications for this  visit. Facility-Administered Medications Ordered in Other Visits: heparin lock flush 100 unit/mL, 500 Units, Intravenous, Once, [name], MD;  heparin lock flush 100 unit/mL, 500 Units, Intravenous, Once, [name], MD, 500 Units at 04/20/12 1043;  sodium chloride 0.9 % injection 10 mL, 10 mL, Intravenous, PRN, [name], MD DISCONTD: sodium chloride 0.9 % injection 10 mL, 10 mL, Intravenous, PRN, [name], MD, 10 mL at 04/20/12 1043  Allergies:  Allergies  Allergen Reactions  . Aprindine   . Bc Powder (Aspirin-Salicylamide-Caffeine)   . Penicillins     Past Medical History, Surgical history, Social history, and Family History were reviewed and updated.  Review of Systems: Constitutional:  Negative for fever, chills, night sweats, anorexia, weight loss, pain. Cardiovascular: no chest pain or dyspnea on exertion Respiratory: no cough, shortness of breath, or wheezing Neurological: negative Dermatological: negative ENT: negative Skin: Negative. Gastrointestinal: no abdominal pain, change in bowel habits, or black or bloody stools Genito-Urinary: no dysuria, trouble voiding, or hematuria Hematological and Lymphatic: negative Breast: negative Musculoskeletal: negative Remaining ROS negative.  Physical Exam: Blood pressure 147/81, pulse 117, temperature 98.5 F (36.9 C), temperature source Oral, resp. rate 18, height 5' 7.5" (1.715 m), weight 175 lb 12.8 oz (79.742 kg). ECOG: 1 General appearance: alert Head: Normocephalic, without obvious abnormality, atraumatic Neck: no adenopathy, no carotid bruit, no JVD, supple, symmetrical, trachea midline and thyroid not enlarged, symmetric, no tenderness/mass/nodules Lymph nodes: Cervical, supraclavicular, and axillary nodes normal. Heart:regular rate and rhythm, S1, S2 normal, no murmur, click, rub or gallop Lung:chest clear, no wheezing, rales, normal symmetric air entry Abdomen: soft, non-tender, without masses or  organomegaly EXT:no erythema, induration, or nodules   Lab Results: Lab Results  Component Value Date   WBC 5.9 04/20/2012   HGB 9.2* 04/20/2012   HCT 31.1* 04/20/2012   MCV 73.3* 04/20/2012   PLT 380 Plt Clumps Present 04/20/2012     Chemistry      Component Value Date/Time   NA 141 04/20/2012 1026   NA 135 03/18/2012 1541   NA 141 08/13/2011 1129   K 3.9 04/20/2012 1026   K 3.0* 03/18/2012 1541   K 3.7 08/13/2011 1129   CL 105 04/20/2012 1026   CL 98 03/18/2012 1541   CL 103 08/13/2011 1129   CO2 20* 04/20/2012 1026   CO2 26 03/18/2012 1541   CO2 28 08/13/2011 1129   BUN 12.0 04/20/2012 1026   BUN 10 03/18/2012 1541   BUN 15 08/13/2011 1129   CREATININE 0.8 04/20/2012 1026   CREATININE 0.62 03/18/2012 1541   CREATININE 0.5* 08/13/2011 1129      Component Value Date/Time   CALCIUM 10.1 04/20/2012 1026   CALCIUM 9.5 03/18/2012 1541   CALCIUM 9.3 08/13/2011 1129   ALKPHOS 68 04/20/2012 1026   ALKPHOS 74 03/18/2012 1541   ALKPHOS 76 08/13/2011 1129   AST 12 04/20/2012 1026   AST 15 03/18/2012 1541   AST 12 08/13/2011 1129   ALT 12 04/20/2012 1026   ALT 10 03/18/2012 1541  BILITOT 0.30 04/20/2012 1026   BILITOT 0.4 03/18/2012 1541   BILITOT 0.50 08/13/2011 1129     Results for VOYLE, TULL (MRN [id number]) as of 04/20/2012 16:20  Ref. Range 10/08/2011 08:34 12/01/2011 13:35 01/07/2012 10:27 02/18/2012 10:51 03/18/2012 15:41  PSA Latest Range: <=4.00 ng/mL 318.90 (H) 406.50 (H) 420.50 (H) 596.50 (H) 630.40 (H)    Impression and Plan:  This is a 61 year old gentleman with the following issues: 1. 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  Recently started [place]. Tolerating this well. Plan it to continue Xtandi. PSA is pending today.  2. Port-A-Cath management.  Continue Port-A-Cath flush every 6-8 weeks.   3. Avascular necrosis. Likely due to Prednisone. He is now off this medication. He is due to have right hip surgery in the near future. There is  no oncological reason why he can not have it at this time and he is clear from our standpoint.  4. Anemia that is multifactorial. Had a transfusion 2 months ago and Hemoglobin is now stable. .  5. Androgen deprivation.  Receives Lupron every four months. Next treatment will be in November 2013. 6. Follow-up.One month.  Clenton Pare 9/11/20134:19 PM

## 2012-04-21 ENCOUNTER — Telehealth: Payer: Self-pay | Admitting: *Deleted

## 2012-04-21 NOTE — Telephone Encounter (Signed)
Spoke with wife about patient's PSA results drawn 04/20/2012.  Wife verbalized understanding.

## 2012-05-09 ENCOUNTER — Other Ambulatory Visit: Payer: Self-pay | Admitting: Oncology

## 2012-05-17 ENCOUNTER — Other Ambulatory Visit: Payer: BC Managed Care – PPO | Admitting: Lab

## 2012-05-17 ENCOUNTER — Other Ambulatory Visit (HOSPITAL_BASED_OUTPATIENT_CLINIC_OR_DEPARTMENT_OTHER): Payer: BC Managed Care – PPO | Admitting: Lab

## 2012-05-17 ENCOUNTER — Ambulatory Visit (HOSPITAL_BASED_OUTPATIENT_CLINIC_OR_DEPARTMENT_OTHER): Payer: BC Managed Care – PPO | Admitting: Oncology

## 2012-05-17 ENCOUNTER — Encounter: Payer: Self-pay | Admitting: Oncology

## 2012-05-17 ENCOUNTER — Ambulatory Visit (HOSPITAL_BASED_OUTPATIENT_CLINIC_OR_DEPARTMENT_OTHER): Payer: BC Managed Care – PPO

## 2012-05-17 VITALS — BP 152/81 | HR 91 | Temp 98.6°F

## 2012-05-17 VITALS — BP 157/84 | HR 101 | Temp 97.9°F | Resp 18 | Ht 67.5 in | Wt 178.7 lb

## 2012-05-17 DIAGNOSIS — Z8546 Personal history of malignant neoplasm of prostate: Secondary | ICD-10-CM

## 2012-05-17 DIAGNOSIS — Z452 Encounter for adjustment and management of vascular access device: Secondary | ICD-10-CM

## 2012-05-17 DIAGNOSIS — D649 Anemia, unspecified: Secondary | ICD-10-CM

## 2012-05-17 DIAGNOSIS — D492 Neoplasm of unspecified behavior of bone, soft tissue, and skin: Secondary | ICD-10-CM

## 2012-05-17 DIAGNOSIS — C61 Malignant neoplasm of prostate: Secondary | ICD-10-CM

## 2012-05-17 DIAGNOSIS — C779 Secondary and unspecified malignant neoplasm of lymph node, unspecified: Secondary | ICD-10-CM

## 2012-05-17 LAB — COMPREHENSIVE METABOLIC PANEL (CC13)
ALT: <6 U/L (ref 0–55)
Albumin: 3.4 g/dL — ABNORMAL LOW (ref 3.5–5.0)
CO2: 21 mEq/L — ABNORMAL LOW (ref 22–29)
Calcium: 9.9 mg/dL (ref 8.4–10.4)
Chloride: 106 mEq/L (ref 98–107)
Glucose: 186 mg/dl — ABNORMAL HIGH (ref 70–99)
Potassium: 3.8 mEq/L (ref 3.5–5.1)
Sodium: 141 mEq/L (ref 136–145)
Total Bilirubin: 0.3 mg/dL (ref 0.20–1.20)
Total Protein: 6.6 g/dL (ref 6.4–8.3)

## 2012-05-17 LAB — CBC WITH DIFFERENTIAL/PLATELET
Basophils Absolute: 0 10*3/uL (ref 0.0–0.1)
Eosinophils Absolute: 0.1 10*3/uL (ref 0.0–0.5)
HGB: 8.8 g/dL — ABNORMAL LOW (ref 13.0–17.1)
MONO#: 0.3 10*3/uL (ref 0.1–0.9)
NEUT#: 3 10*3/uL (ref 1.5–6.5)
RBC: 3.95 10*6/uL — ABNORMAL LOW (ref 4.20–5.82)
RDW: 20.2 % — ABNORMAL HIGH (ref 11.0–14.6)
WBC: 4.6 10*3/uL (ref 4.0–10.3)
lymph#: 1.2 10*3/uL (ref 0.9–3.3)
nRBC: 0 % (ref 0–0)

## 2012-05-17 LAB — PSA: PSA: 469.8 ng/mL — ABNORMAL HIGH (ref <=4.00)

## 2012-05-17 MED ORDER — SODIUM CHLORIDE 0.9 % IJ SOLN
10.0000 mL | INTRAMUSCULAR | Status: DC | PRN
  Administered 2012-05-17: 10 mL via INTRAVENOUS
  Filled 2012-05-17: qty 10

## 2012-05-17 MED ORDER — SENNA 8.6 MG PO TABS: 1.0000 | ORAL_TABLET | Freq: Every day | ORAL | Status: DC

## 2012-05-17 MED ORDER — HEPARIN SOD (PORK) LOCK FLUSH 100 UNIT/ML IV SOLN
500.0000 [IU] | Freq: Once | INTRAVENOUS | Status: AC
  Administered 2012-05-17: 500 [IU] via INTRAVENOUS
  Filled 2012-05-17: qty 5

## 2012-05-17 NOTE — Patient Instructions (Addendum)
Results for Andrew Nelson, Andrew Nelson (MRN [id number]) as of 05/17/2012 10:46  Ref. Range 12/01/2011 13:35 01/07/2012 10:27 02/18/2012 10:51 03/18/2012 15:41 04/20/2012 10:26  PSA Latest Range: <=4.00 ng/mL 406.50 (H) 420.50 (H) 596.50 (H) 630.40 (H) 498.50 (H)

## 2012-05-17 NOTE — Progress Notes (Signed)
Hematology and Oncology Follow Up Visit  NOE GOYER [id number] 12/05/1950 61 y.o. 05/17/2012 11:40 AM  CC:  [name], MD  [name], MD  [name], M.D.    Principle Diagnosis: This is a 61 year old gentleman with prostate cancer initially diagnosed in 2007.  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  Prior Therapy: 1. Status post laparoscopic prostatectomy and bilateral lymphadenectomy, postoperative PSA was 0.5. 2. He received salvage radiation therapy due to persistent elevation in his PSA. 3. Patient treated with Lupron and Casodex due to a rise in his PSA, subsequently treated with Casodex withdrawal. 4. 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 5. Patient treated with 10 cycles of Taxotere and prednisone.  Taxotere was reduced to 60 mg/sq m; last dose given was in October 2011.  However, PSA started to rise up to 250. 6. Zytiga 1000 mg daily beginning October 2011 through August 2013. 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 Zytiga was stopped due to a rising PSA up to 630.40 on 03/18/12.  Current therapy: Xtandi 160 mg daily started on 04/13/12.   Interim History:  Mr. Schetter presents today for a followup visit with is wife. He is doing about the same. Still ambulating with the help of crutches. He is scheduled to have right hip surgery in the near future due to avascular necrosis. Started Xtandi about 1 week ago and is tolerating this well.  He had not had any abdominal pain, had not had any discomfort, had not had any fluid retention at this time.  He is less active since we last saw him due to pain in his hip and decreased mobility. Had not had any major changes in his urine flow at this time. No new symptoms at this time. No seizure activity. No  abdominal pain, nausea, or vomiting. No bleeding.  Medications: I have reviewed the patient's current medications. Current outpatient prescriptions:calcium-vitamin D 250-100 MG-UNIT per tablet, Take 1 tablet by mouth daily.  , Disp: , Rfl: ;  doxazosin (CARDURA) 8 MG tablet, Take 8 mg by mouth at bedtime., Disp: , Rfl: ;  doxazosin (CARDURA) 8 MG tablet, TAKE 1 TABLET BY MOUTH EVERY DAY, Disp: 90 tablet, Rfl: 3;  fish oil-omega-3 fatty acids 1000 MG capsule, Take 1 g by mouth daily.  , Disp: , Rfl:  glucose blood (ACCU-CHEK ACTIVE STRIPS) test strip, Use as instructed, Disp: 100 each, Rfl: 3;  hydrochlorothiazide (HYDRODIURIL) 25 MG tablet, Take 25 mg by mouth daily., Disp: , Rfl: ;  hydrochlorothiazide (HYDRODIURIL) 25 MG tablet, TAKE 1 TABLET BY MOUTH ONCE DAILY, Disp: 90 tablet, Rfl: 3;  insulin aspart protamine-insulin aspart (NOVOLOG 70/30) (70-30) 100 UNIT/ML injection, Inject 20 Units into the skin daily., Disp: , Rfl:  leuprolide (LUPRON) 30 MG injection, Inject 30 mg into the muscle every 4 (four) months.  , Disp: , Rfl: ;  lidocaine-prilocaine (EMLA) cream, APPLY TO PORTA-CATH SITE 30 MINS TO 2 HOURS PRIOR TO TREATMENT & COVER WITH PLASTIC WRAP, Disp: 30 g, Rfl: 0;  lisinopril (PRINIVIL,ZESTRIL) 10 MG tablet, Take 10 mg by mouth daily., Disp: , Rfl: ;  lisinopril (PRINIVIL,ZESTRIL) 10 MG tablet, TAKE 1 TABLET BY MOUTH ONCE DAILY, Disp: 90 tablet, Rfl: 3 metFORMIN (GLUCOPHAGE) 850 MG tablet, Take 850 mg by mouth 2 (two) times daily with a meal., Disp: ,  Rfl: ;  metFORMIN (GLUCOPHAGE) 850 MG tablet, TAKE 1 TABLET BY MOUTH TWICE DAILY WITH A MEAL, Disp: 180 tablet, Rfl: 3;  oxyCODONE-acetaminophen (PERCOCET) 10-325 MG per tablet, , Disp: , Rfl: ;  senna (SENOKOT) 8.6 MG TABS, Take 1 tablet (8.6 mg total) by mouth daily., Disp: 30 each, Rfl: 6 XTANDI 40 MG capsule, TAKE 4 CAPSULES BY MOUTH ONCE DAILY, Disp: 120 capsule, Rfl: 0 No current facility-administered medications for this  visit. Facility-Administered Medications Ordered in Other Visits: heparin lock flush 100 unit/mL, 500 Units, Intravenous, Once, [name], MD;  heparin lock flush 100 unit/mL, 500 Units, Intravenous, Once, [name], MD, 500 Units at 05/17/12 1056;  sodium chloride 0.9 % injection 10 mL, 10 mL, Intravenous, PRN, [name], MD;  sodium chloride 0.9 % injection 10 mL, 10 mL, Intravenous, PRN, [name], MD, 10 mL at 05/17/12 1057  Allergies:  Allergies  Allergen Reactions  . Aprindine   . Bc Powder (Aspirin-Salicylamide-Caffeine)   . Penicillins     Past Medical History, Surgical history, Social history, and Family History were reviewed and updated.  Review of Systems: Constitutional:  Negative for fever, chills, night sweats, anorexia, weight loss, pain. Cardiovascular: no chest pain or dyspnea on exertion Respiratory: no cough, shortness of breath, or wheezing Neurological: negative Dermatological: negative ENT: negative Skin: Negative. Gastrointestinal: no abdominal pain, change in bowel habits, or black or bloody stools Genito-Urinary: no dysuria, trouble voiding, or hematuria Hematological and Lymphatic: negative Breast: negative Musculoskeletal: negative Remaining ROS negative.  Physical Exam: Blood pressure 157/84, pulse 101, temperature 97.9 F (36.6 C), temperature source Oral, resp. rate 18, height 5' 7.5" (1.715 m), weight 178 lb 11.2 oz (81.058 kg). ECOG: 1 General appearance: alert Head: Normocephalic, without obvious abnormality, atraumatic Neck: no adenopathy, no carotid bruit, no JVD, supple, symmetrical, trachea midline and thyroid not enlarged, symmetric, no tenderness/mass/nodules Lymph nodes: Cervical, supraclavicular, and axillary nodes normal. Heart:regular rate and rhythm, S1, S2 normal, no murmur, click, rub or gallop Lung:chest clear, no wheezing, rales, normal symmetric air entry Abdomen: soft, non-tender, without masses or  organomegaly EXT:no erythema, induration, or nodules   Lab Results: Lab Results  Component Value Date   WBC 4.6 05/17/2012   HGB 8.8* 05/17/2012   HCT 29.5* 05/17/2012   MCV 74.7* 05/17/2012   PLT 371 05/17/2012     Chemistry      Component Value Date/Time   NA 141 04/20/2012 1026   NA 135 03/18/2012 1541   NA 141 08/13/2011 1129   K 3.9 04/20/2012 1026   K 3.0* 03/18/2012 1541   K 3.7 08/13/2011 1129   CL 105 04/20/2012 1026   CL 98 03/18/2012 1541   CL 103 08/13/2011 1129   CO2 20* 04/20/2012 1026   CO2 26 03/18/2012 1541   CO2 28 08/13/2011 1129   BUN 12.0 04/20/2012 1026   BUN 10 03/18/2012 1541   BUN 15 08/13/2011 1129   CREATININE 0.8 04/20/2012 1026   CREATININE 0.62 03/18/2012 1541   CREATININE 0.5* 08/13/2011 1129      Component Value Date/Time   CALCIUM 10.1 04/20/2012 1026   CALCIUM 9.5 03/18/2012 1541   CALCIUM 9.3 08/13/2011 1129   ALKPHOS 68 04/20/2012 1026   ALKPHOS 74 03/18/2012 1541   ALKPHOS 76 08/13/2011 1129   AST 12 04/20/2012 1026   AST 15 03/18/2012 1541   AST 12 08/13/2011 1129   ALT 12 04/20/2012 1026   ALT 10 03/18/2012 1541   BILITOT  0.30 04/20/2012 1026   BILITOT 0.4 03/18/2012 1541   BILITOT 0.50 08/13/2011 1129     Results for QUAMEL, FITZMAURICE (MRN [id number]) as of 05/17/2012 11:40  Ref. Range 01/07/2012 10:27 02/18/2012 10:51 03/18/2012 15:41 04/20/2012 10:26  PSA Latest Range: <=4.00 ng/mL 420.50 (H) 596.50 (H) 630.40 (H) 498.50 (H)    Impression and Plan:  This is a 61 year old gentleman with the following issues: 1. 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  Recently started [place]. Tolerating this well. Plan it to continue Xtandi. PSA is pending today.  2. Port-A-Cath management.  Continue Port-A-Cath flush every 6-8 weeks.   3. Avascular necrosis. Likely due to Prednisone. He is now off this medication. He is due to have right hip surgery in the near future. There is no oncological reason why he can not have it  at this time and he is clear from our standpoint.  4. Anemia that is multifactorial. Had a transfusion 2 months ago and Hemoglobin is now stable. .  5. Androgen deprivation.  Receives Lupron every four months. Next treatment will be in November 2013. 6. Follow-up.One month.  Reginald Weida 10/8/201311:40 AM

## 2012-05-18 ENCOUNTER — Telehealth: Payer: Self-pay | Admitting: *Deleted

## 2012-05-18 ENCOUNTER — Other Ambulatory Visit: Payer: Self-pay | Admitting: Family Medicine

## 2012-05-18 NOTE — Telephone Encounter (Signed)
Called patient with PSA results drawn 05/17/2012.  Patient verbalized understanding.

## 2012-06-07 ENCOUNTER — Other Ambulatory Visit: Payer: Self-pay | Admitting: Oncology

## 2012-06-28 ENCOUNTER — Ambulatory Visit (HOSPITAL_BASED_OUTPATIENT_CLINIC_OR_DEPARTMENT_OTHER): Payer: BC Managed Care – PPO

## 2012-06-28 ENCOUNTER — Ambulatory Visit (HOSPITAL_BASED_OUTPATIENT_CLINIC_OR_DEPARTMENT_OTHER): Payer: BC Managed Care – PPO | Admitting: Oncology

## 2012-06-28 ENCOUNTER — Other Ambulatory Visit (HOSPITAL_BASED_OUTPATIENT_CLINIC_OR_DEPARTMENT_OTHER): Payer: BC Managed Care – PPO | Admitting: Lab

## 2012-06-28 VITALS — BP 141/83 | HR 94 | Temp 97.7°F | Resp 16 | Wt 181.2 lb

## 2012-06-28 DIAGNOSIS — C61 Malignant neoplasm of prostate: Secondary | ICD-10-CM

## 2012-06-28 DIAGNOSIS — C775 Secondary and unspecified malignant neoplasm of intrapelvic lymph nodes: Secondary | ICD-10-CM

## 2012-06-28 DIAGNOSIS — D649 Anemia, unspecified: Secondary | ICD-10-CM

## 2012-06-28 DIAGNOSIS — Z5111 Encounter for antineoplastic chemotherapy: Secondary | ICD-10-CM

## 2012-06-28 DIAGNOSIS — Z8546 Personal history of malignant neoplasm of prostate: Secondary | ICD-10-CM

## 2012-06-28 DIAGNOSIS — R5381 Other malaise: Secondary | ICD-10-CM

## 2012-06-28 DIAGNOSIS — F17200 Nicotine dependence, unspecified, uncomplicated: Secondary | ICD-10-CM

## 2012-06-28 DIAGNOSIS — E109 Type 1 diabetes mellitus without complications: Secondary | ICD-10-CM

## 2012-06-28 DIAGNOSIS — Z86010 Personal history of colonic polyps: Secondary | ICD-10-CM

## 2012-06-28 DIAGNOSIS — D126 Benign neoplasm of colon, unspecified: Secondary | ICD-10-CM

## 2012-06-28 DIAGNOSIS — Z452 Encounter for adjustment and management of vascular access device: Secondary | ICD-10-CM

## 2012-06-28 DIAGNOSIS — D509 Iron deficiency anemia, unspecified: Secondary | ICD-10-CM

## 2012-06-28 LAB — CBC WITH DIFFERENTIAL/PLATELET
BASO%: 2.3 % — ABNORMAL HIGH (ref 0.0–2.0)
EOS%: 2.2 % (ref 0.0–7.0)
HCT: 28.9 % — ABNORMAL LOW (ref 38.4–49.9)
MCH: 23.8 pg — ABNORMAL LOW (ref 27.2–33.4)
MCHC: 31.9 g/dL — ABNORMAL LOW (ref 32.0–36.0)
NEUT%: 59 % (ref 39.0–75.0)
RBC: 3.87 10*6/uL — ABNORMAL LOW (ref 4.20–5.82)
lymph#: 1.6 10*3/uL (ref 0.9–3.3)

## 2012-06-28 LAB — COMPREHENSIVE METABOLIC PANEL (CC13)
ALT: 7 U/L (ref 0–55)
AST: 10 U/L (ref 5–34)
Calcium: 10.3 mg/dL (ref 8.4–10.4)
Chloride: 106 mEq/L (ref 98–107)
Creatinine: 0.7 mg/dL (ref 0.7–1.3)
Sodium: 139 mEq/L (ref 136–145)
Total Bilirubin: 0.32 mg/dL (ref 0.20–1.20)

## 2012-06-28 MED ORDER — ALTEPLASE 2 MG IJ SOLR
2.0000 mg | Freq: Once | INTRAMUSCULAR | Status: AC
  Administered 2012-06-28: 2 mg
  Filled 2012-06-28: qty 2

## 2012-06-28 MED ORDER — SODIUM CHLORIDE 0.9 % IJ SOLN
10.0000 mL | INTRAMUSCULAR | Status: DC | PRN
  Administered 2012-06-28: 10 mL via INTRAVENOUS
  Filled 2012-06-28: qty 10

## 2012-06-28 MED ORDER — HEPARIN SOD (PORK) LOCK FLUSH 100 UNIT/ML IV SOLN
500.0000 [IU] | Freq: Once | INTRAVENOUS | Status: AC
  Administered 2012-06-28: 500 [IU] via INTRAVENOUS
  Filled 2012-06-28: qty 5

## 2012-06-28 MED ORDER — LEUPROLIDE ACETATE (4 MONTH) 30 MG IM KIT
30.0000 mg | PACK | Freq: Once | INTRAMUSCULAR | Status: AC
  Administered 2012-06-28: 30 mg via INTRAMUSCULAR
  Filled 2012-06-28: qty 30

## 2012-06-28 NOTE — Progress Notes (Signed)
Hematology and Oncology Follow Up Visit  Andrew Nelson [id number] November 10, 1950 61 y.o. 06/28/2012 12:36 PM  CC:  [name], MD  [name], MD  [name], M.D.    Principle Diagnosis: This is a 61 year old gentleman with prostate cancer initially diagnosed in 2007.  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  Prior Therapy: 1. Status post laparoscopic prostatectomy and bilateral lymphadenectomy, postoperative PSA was 0.5. 2. He received salvage radiation therapy due to persistent elevation in his PSA. 3. Patient treated with Lupron and Casodex due to a rise in his PSA, subsequently treated with Casodex withdrawal. 4. 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 5. Patient treated with 10 cycles of Taxotere and prednisone.  Taxotere was reduced to 60 mg/sq m; last dose given was in October 2011.  However, PSA started to rise up to 250. 6. Zytiga 1000 mg daily beginning October 2011 through August 2013. 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 Zytiga was stopped due to a rising PSA up to 630.40 on 03/18/12.  Current therapy: Xtandi 160 mg daily started on 04/13/12.   Interim History:  Andrew Nelson presents today for a followup visit with is wife. He is doing better today. Still ambulating with the help of crutches. He is scheduled to have right hip surgery in the near future due to avascular necrosis. Still on Xtandi since 04/2012 and is tolerating this well.  He had not had any abdominal pain, had not had any discomfort, had not had any fluid retention at this time.  He is more active since we last saw him. Had not had any major changes in his urine flow at this time. No new symptoms at this time. No seizure activity. No abdominal pain, nausea, or vomiting. No  bleeding.  Medications: I have reviewed the patient's current medications. Current outpatient prescriptions:ACCU-CHEK AVIVA PLUS test strip, USE AS DIRECTED, Disp: 100 strip, Rfl: 3;  calcium-vitamin D 250-100 MG-UNIT per tablet, Take 1 tablet by mouth daily.  , Disp: , Rfl: ;  doxazosin (CARDURA) 8 MG tablet, Take 8 mg by mouth at bedtime., Disp: , Rfl: ;  doxazosin (CARDURA) 8 MG tablet, TAKE 1 TABLET BY MOUTH EVERY DAY, Disp: 90 tablet, Rfl: 3 fish oil-omega-3 fatty acids 1000 MG capsule, Take 1 g by mouth daily.  , Disp: , Rfl: ;  hydrochlorothiazide (HYDRODIURIL) 25 MG tablet, Take 25 mg by mouth daily., Disp: , Rfl: ;  hydrochlorothiazide (HYDRODIURIL) 25 MG tablet, TAKE 1 TABLET BY MOUTH ONCE DAILY, Disp: 90 tablet, Rfl: 3;  insulin aspart protamine-insulin aspart (NOVOLOG 70/30) (70-30) 100 UNIT/ML injection, Inject 20 Units into the skin daily., Disp: , Rfl:  leuprolide (LUPRON) 30 MG injection, Inject 30 mg into the muscle every 4 (four) months.  , Disp: , Rfl: ;  lidocaine-prilocaine (EMLA) cream, APPLY TO PORTA-CATH SITE 30 MINS TO 2 HOURS PRIOR TO TREATMENT & COVER WITH PLASTIC WRAP, Disp: 30 g, Rfl: 0;  lisinopril (PRINIVIL,ZESTRIL) 10 MG tablet, Take 10 mg by mouth daily., Disp: , Rfl: ;  lisinopril (PRINIVIL,ZESTRIL) 10 MG tablet, TAKE 1 TABLET BY MOUTH ONCE DAILY, Disp: 90 tablet, Rfl: 3 metFORMIN (GLUCOPHAGE) 850 MG tablet, Take 850 mg by mouth 2 (two) times daily with a meal., Disp: , Rfl: ;  senna (SENOKOT) 8.6 MG TABS, Take 1 tablet (8.6  mg total) by mouth daily., Disp: 30 each, Rfl: 6;  XTANDI 40 MG capsule, TAKE 4 CAPSULES BY MOUTH ONCE DAILY, Disp: 120 capsule, Rfl: 0 No current facility-administered medications for this visit. Facility-Administered Medications Ordered in Other Visits: [COMPLETED] alteplase (CATHFLO ACTIVASE) injection 2 mg, 2 mg, Intracatheter, Once, [name], MD, 2 mg at 06/28/12 1211;  heparin lock flush 100 unit/mL, 500 Units, Intravenous, Once, [name], MD;  heparin lock flush 100 unit/mL, 500 Units, Intravenous, Once, [name], MD;  leuprolide (LUPRON) injection 30 mg, 30 mg, Intramuscular, Once, [name], MD sodium chloride 0.9 % injection 10 mL, 10 mL, Intravenous, PRN, [name], MD;  sodium chloride 0.9 % injection 10 mL, 10 mL, Intravenous, PRN, [name], MD  Allergies:  Allergies  Allergen Reactions  . Aprindine   . Bc Powder (Aspirin-Salicylamide-Caffeine)   . Penicillins     Past Medical History, Surgical history, Social history, and Family History were reviewed and updated.  Review of Systems: Constitutional:  Negative for fever, chills, night sweats, anorexia, weight loss, pain. Cardiovascular: no chest pain or dyspnea on exertion Respiratory: no cough, shortness of breath, or wheezing Neurological: negative Dermatological: negative ENT: negative Skin: Negative. Gastrointestinal: no abdominal pain, change in bowel habits, or black or bloody stools Genito-Urinary: no dysuria, trouble voiding, or hematuria Hematological and Lymphatic: negative Breast: negative Musculoskeletal: negative Remaining ROS negative.  Physical Exam: Blood pressure 141/83, pulse 94, temperature 97.7 F (36.5 C), temperature source Oral, resp. rate 16, weight 181 lb 4 oz (82.214 kg). ECOG: 1 General appearance: alert Head: Normocephalic, without obvious abnormality, atraumatic Neck: no adenopathy, no carotid bruit, no JVD, supple, symmetrical, trachea midline and thyroid not enlarged, symmetric, no tenderness/mass/nodules Lymph nodes: Cervical, supraclavicular, and axillary nodes normal. Heart:regular rate and rhythm, S1, S2 normal, no murmur, click, rub or gallop Lung:chest clear, no wheezing, rales, normal symmetric air entry Abdomen: soft, non-tender, without masses or organomegaly EXT:no erythema, induration, or nodules   Lab Results: Lab Results  Component Value Date   WBC 4.6 05/17/2012   HGB 8.8*  05/17/2012   HCT 29.5* 05/17/2012   MCV 74.7* 05/17/2012   PLT 371 05/17/2012     Chemistry      Component Value Date/Time   NA 141 05/17/2012 1038   NA 135 03/18/2012 1541   NA 141 08/13/2011 1129   K 3.8 05/17/2012 1038   K 3.0* 03/18/2012 1541   K 3.7 08/13/2011 1129   CL 106 05/17/2012 1038   CL 98 03/18/2012 1541   CL 103 08/13/2011 1129   CO2 21* 05/17/2012 1038   CO2 26 03/18/2012 1541   CO2 28 08/13/2011 1129   BUN 15.0 05/17/2012 1038   BUN 10 03/18/2012 1541   BUN 15 08/13/2011 1129   CREATININE 0.8 05/17/2012 1038   CREATININE 0.62 03/18/2012 1541   CREATININE 0.5* 08/13/2011 1129      Component Value Date/Time   CALCIUM 9.9 05/17/2012 1038   CALCIUM 9.5 03/18/2012 1541   CALCIUM 9.3 08/13/2011 1129   ALKPHOS 65 05/17/2012 1038   ALKPHOS 74 03/18/2012 1541   ALKPHOS 76 08/13/2011 1129   AST 8 05/17/2012 1038   AST 15 03/18/2012 1541   AST 12 08/13/2011 1129   ALT <6 05/17/2012 1038   ALT 10 03/18/2012 1541   BILITOT 0.30 05/17/2012 1038   BILITOT 0.4 03/18/2012 1541   BILITOT 0.50 08/13/2011 1129     Results for Sanko, [place] L (MRN  [id number]) as of 06/28/2012 12:22  Ref. Range 03/18/2012 15:41 04/20/2012 10:26 05/17/2012 10:38  PSA Latest Range: <=4.00 ng/mL 630.40 (H) 498.50 (H) 469.80 (H)     Impression and Plan:  This is a 61 year old gentleman with the following issues: 1. 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  Recently started [place]. Tolerating this well. Plan it to continue Xtandi. PSA is pending today.  2. Port-A-Cath management.  Continue Port-A-Cath flush every 4 weeks for labs.   3. Avascular necrosis. He is due to have right hip surgery in the near future. There is no oncological reason why he can not have it at this time and he is clear from our standpoint.  4. Anemia that is multifactorial. Had a transfusion 2 months ago and Hemoglobin is now stable. .  5. Androgen deprivation.  Receives Lupron every four months. He  will be treated today and next treatment will be on 10/2012. 6. Follow-up.One month.  Kanyla Omeara 11/19/201312:36 PM

## 2012-06-29 ENCOUNTER — Telehealth: Payer: Self-pay | Admitting: Oncology

## 2012-06-29 ENCOUNTER — Encounter: Payer: Self-pay | Admitting: Oncology

## 2012-06-29 NOTE — Telephone Encounter (Signed)
lvm for pt regarding Jan 2014.....mailed appt schedule to pt.Marland KitchenMarland Kitchen

## 2012-06-29 NOTE — Progress Notes (Signed)
I discussed the PSA result with Andrew Nelson. I advised him that we need to restage him with a CT scan.  He prefer to see a repeat PSA in December before we do that. I thought that this is reasonable approach.

## 2012-07-11 ENCOUNTER — Other Ambulatory Visit: Payer: Self-pay | Admitting: Oncology

## 2012-07-22 NOTE — Progress Notes (Signed)
Faxed last office notes to Harriett Sine, Nurse Case Manager @ [facility] [phone]. [phone] / [phone]--fax)

## 2012-07-28 ENCOUNTER — Ambulatory Visit (HOSPITAL_BASED_OUTPATIENT_CLINIC_OR_DEPARTMENT_OTHER): Payer: BC Managed Care – PPO | Admitting: Oncology

## 2012-07-28 ENCOUNTER — Encounter: Payer: Self-pay | Admitting: Oncology

## 2012-07-28 ENCOUNTER — Ambulatory Visit: Payer: BC Managed Care – PPO

## 2012-07-28 ENCOUNTER — Other Ambulatory Visit (HOSPITAL_BASED_OUTPATIENT_CLINIC_OR_DEPARTMENT_OTHER): Payer: BC Managed Care – PPO | Admitting: Lab

## 2012-07-28 VITALS — BP 129/71 | HR 110 | Temp 98.4°F

## 2012-07-28 VITALS — Temp 98.3°F | Resp 18 | Ht 67.5 in | Wt 181.0 lb

## 2012-07-28 DIAGNOSIS — C61 Malignant neoplasm of prostate: Secondary | ICD-10-CM

## 2012-07-28 DIAGNOSIS — Z8546 Personal history of malignant neoplasm of prostate: Secondary | ICD-10-CM

## 2012-07-28 DIAGNOSIS — E291 Testicular hypofunction: Secondary | ICD-10-CM

## 2012-07-28 DIAGNOSIS — D649 Anemia, unspecified: Secondary | ICD-10-CM

## 2012-07-28 DIAGNOSIS — C775 Secondary and unspecified malignant neoplasm of intrapelvic lymph nodes: Secondary | ICD-10-CM

## 2012-07-28 LAB — CBC WITH DIFFERENTIAL/PLATELET
Basophils Absolute: 0.1 10*3/uL (ref 0.0–0.1)
EOS%: 1.1 % (ref 0.0–7.0)
Eosinophils Absolute: 0.1 10*3/uL (ref 0.0–0.5)
HGB: 9.5 g/dL — ABNORMAL LOW (ref 13.0–17.1)
NEUT#: 3.2 10*3/uL (ref 1.5–6.5)
RDW: 17 % — ABNORMAL HIGH (ref 11.0–14.6)
lymph#: 1.5 10*3/uL (ref 0.9–3.3)

## 2012-07-28 LAB — COMPREHENSIVE METABOLIC PANEL (CC13)
BUN: 19 mg/dL (ref 7.0–26.0)
CO2: 26 mEq/L (ref 22–29)
Calcium: 10 mg/dL (ref 8.4–10.4)
Chloride: 104 mEq/L (ref 98–107)
Creatinine: 0.8 mg/dL (ref 0.7–1.3)

## 2012-07-28 LAB — PSA: PSA: 765.9 ng/mL — ABNORMAL HIGH (ref <=4.00)

## 2012-07-28 MED ORDER — SODIUM CHLORIDE 0.9 % IJ SOLN
10.0000 mL | INTRAMUSCULAR | Status: DC | PRN
  Administered 2012-07-28: 10 mL via INTRAVENOUS
  Filled 2012-07-28: qty 10

## 2012-07-28 MED ORDER — HEPARIN SOD (PORK) LOCK FLUSH 100 UNIT/ML IV SOLN
500.0000 [IU] | Freq: Once | INTRAVENOUS | Status: AC
  Administered 2012-07-28: 500 [IU] via INTRAVENOUS
  Filled 2012-07-28: qty 5

## 2012-07-28 NOTE — Progress Notes (Signed)
Hematology and Oncology Follow Up Visit  Andrew Nelson [id number] [birth year] 60 y.o. 07/28/2012 12:35 PM  CC:  [name], MD  [name], MD  [name], M.D.    Principle Diagnosis: This is a 61 year old gentleman with prostate cancer initially diagnosed in 2007.  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  Prior Therapy: 1. Status post laparoscopic prostatectomy and bilateral lymphadenectomy, postoperative PSA was 0.5. 2. He received salvage radiation therapy due to persistent elevation in his PSA. 3. Patient treated with Lupron and Casodex due to a rise in his PSA, subsequently treated with Casodex withdrawal. 4. 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 5. Patient treated with 10 cycles of Taxotere and prednisone.  Taxotere was reduced to 60 mg/sq m; last dose given was in October 2011.  However, PSA started to rise up to 250. 6. Zytiga 1000 mg daily beginning October 2011 through August 2013. 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 Zytiga was stopped due to a rising PSA up to 630.40 on 03/18/12.  Current therapy: Xtandi 160 mg daily started on 04/13/12.   Interim History:  Andrew Nelson presents today for a followup visit with is wife. He is doing better today. No longer ambulating with crutches. He has decided not to pursue surgery for his hip due to avascular necrosis at this time. Still on Xtandi since 04/2012 and is tolerating this well.  He had not had any abdominal pain, had not had any discomfort, had not had any fluid retention at this time.  He is more active since we last saw him. Had not had any major changes in his urine flow at this time. No new symptoms at this time. No seizure activity. No abdominal pain, nausea, or vomiting. No  bleeding.  Medications: I have reviewed the patient's current medications. Current outpatient prescriptions:ACCU-CHEK AVIVA PLUS test strip, USE AS DIRECTED, Disp: 100 strip, Rfl: 3;  calcium-vitamin D 250-100 MG-UNIT per tablet, Take 1 tablet by mouth daily.  , Disp: , Rfl: ;  doxazosin (CARDURA) 8 MG tablet, TAKE 1 TABLET BY MOUTH EVERY DAY, Disp: 90 tablet, Rfl: 3;  fish oil-omega-3 fatty acids 1000 MG capsule, Take 1 g by mouth daily.  , Disp: , Rfl:  hydrochlorothiazide (HYDRODIURIL) 25 MG tablet, TAKE 1 TABLET BY MOUTH ONCE DAILY, Disp: 90 tablet, Rfl: 3;  insulin aspart protamine-insulin aspart (NOVOLOG 70/30) (70-30) 100 UNIT/ML injection, Inject 20 Units into the skin daily., Disp: , Rfl: ;  leuprolide (LUPRON) 30 MG injection, Inject 30 mg into the muscle every 4 (four) months.  , Disp: , Rfl:  lidocaine-prilocaine (EMLA) cream, APPLY TO PORTA-CATH SITE 30 MINS TO 2 HOURS PRIOR TO TREATMENT & COVER WITH PLASTIC WRAP, Disp: 30 g, Rfl: 0;  lisinopril (PRINIVIL,ZESTRIL) 10 MG tablet, TAKE 1 TABLET BY MOUTH ONCE DAILY, Disp: 90 tablet, Rfl: 3;  metFORMIN (GLUCOPHAGE) 850 MG tablet, Take 850 mg by mouth 2 (two) times daily with a meal., Disp: , Rfl:  senna (SENOKOT) 8.6 MG TABS, Take 1 tablet (8.6 mg total) by mouth daily., Disp: 30 each, Rfl: 6;  XTANDI 40 MG capsule, TAKE 4 CAPSULES BY MOUTH ONCE DAILY, Disp: 120 capsule, Rfl: 0 No current facility-administered medications for this visit. Facility-Administered Medications Ordered in Other Visits: heparin lock flush 100 unit/mL, 500 Units, Intravenous, Once, [employer]  Kallie Locks, MD;  sodium chloride 0.9 % injection 10 mL, 10 mL, Intravenous, PRN, [name], MD  Allergies:  Allergies  Allergen Reactions  . Aprindine   . Bc Powder (Aspirin-Salicylamide-Caffeine)   . Penicillins     Past Medical History, Surgical history, Social history, and Family History were reviewed and updated.  Review of Systems: Constitutional:  Negative for fever,  chills, night sweats, anorexia, weight loss, pain. Cardiovascular: no chest pain or dyspnea on exertion Respiratory: no cough, shortness of breath, or wheezing Neurological: negative Dermatological: negative ENT: negative Skin: Negative. Gastrointestinal: no abdominal pain, change in bowel habits, or black or bloody stools Genito-Urinary: no dysuria, trouble voiding, or hematuria Hematological and Lymphatic: negative Breast: negative Musculoskeletal: negative Remaining ROS negative.  Physical Exam: Temperature 98.3 F (36.8 C), temperature source Oral, resp. rate 18, height 5' 7.5" (1.715 m), weight 181 lb (82.101 kg). ECOG: 1 General appearance: alert Head: Normocephalic, without obvious abnormality, atraumatic Neck: no adenopathy, no carotid bruit, no JVD, supple, symmetrical, trachea midline and thyroid not enlarged, symmetric, no tenderness/mass/nodules Lymph nodes: Cervical, supraclavicular, and axillary nodes normal. Heart:regular rate and rhythm, S1, S2 normal, no murmur, click, rub or gallop Lung:chest clear, no wheezing, rales, normal symmetric air entry Abdomen: soft, non-tender, without masses or organomegaly EXT:no erythema, induration, or nodules   Lab Results: Lab Results  Component Value Date   WBC 5.1 07/28/2012   HGB 9.5* 07/28/2012   HCT 29.8* 07/28/2012   MCV 74.5* 07/28/2012   PLT 219 07/28/2012     Chemistry      Component Value Date/Time   NA 139 07/28/2012 1033   NA 135 03/18/2012 1541   NA 141 08/13/2011 1129   K 3.5 07/28/2012 1033   K 3.0* 03/18/2012 1541   K 3.7 08/13/2011 1129   CL 104 07/28/2012 1033   CL 98 03/18/2012 1541   CL 103 08/13/2011 1129   CO2 26 07/28/2012 1033   CO2 26 03/18/2012 1541   CO2 28 08/13/2011 1129   BUN 19.0 07/28/2012 1033   BUN 10 03/18/2012 1541   BUN 15 08/13/2011 1129   CREATININE 0.8 07/28/2012 1033   CREATININE 0.62 03/18/2012 1541   CREATININE 0.5* 08/13/2011 1129      Component Value Date/Time   CALCIUM 10.0 07/28/2012  1033   CALCIUM 9.5 03/18/2012 1541   CALCIUM 9.3 08/13/2011 1129   ALKPHOS 83 07/28/2012 1033   ALKPHOS 74 03/18/2012 1541   ALKPHOS 76 08/13/2011 1129   AST 10 07/28/2012 1033   AST 15 03/18/2012 1541   AST 12 08/13/2011 1129   ALT 9 07/28/2012 1033   ALT 10 03/18/2012 1541   BILITOT 0.32 07/28/2012 1033   BILITOT 0.4 03/18/2012 1541   BILITOT 0.50 08/13/2011 1129     Results for Mitnick, Andrew Nelson (MRN [id number]) as of 07/28/2012 12:35  Ref. Range 03/18/2012 15:41 04/20/2012 10:26 05/17/2012 10:38 06/28/2012 11:33  PSA Latest Range: <=4.00 ng/mL 630.40 (H) 498.50 (H) 469.80 (H) 686.60 (H)    Impression and Plan:  This is a 61 year old gentleman with the following issues: 1. 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  Recently started [place]. Tolerating this well. PSA is rising as of last month. Pending today. Dr Clelia Croft has recommended CT scans, but the patient prefers to wait until the results of the PSA today before deciding. If PSA continues to rise, recommend that he pursue CT scans which he is agreeable to.  2.  Port-A-Cath management.  Continue Port-A-Cath flush every 4 weeks for labs.   3. Avascular necrosis. Right hip surgery is on hold at this time. There is no oncological reason why he can not have it at this time and he is clear from our standpoint.  4. Anemia that is multifactorial. Had a transfusion 3 months ago and Hemoglobin is now stable. .  5. Androgen deprivation.  Receives Lupron every four months. Next treatment will be on 10/2012. 6. Follow-up.One month.  Sidni Fusco 12/19/201312:35 PM

## 2012-07-29 ENCOUNTER — Other Ambulatory Visit: Payer: Self-pay | Admitting: Oncology

## 2012-07-29 DIAGNOSIS — Z8546 Personal history of malignant neoplasm of prostate: Secondary | ICD-10-CM

## 2012-08-05 ENCOUNTER — Other Ambulatory Visit: Payer: Self-pay | Admitting: Oncology

## 2012-08-08 ENCOUNTER — Ambulatory Visit (HOSPITAL_COMMUNITY)
Admission: RE | Admit: 2012-08-08 | Discharge: 2012-08-08 | Disposition: A | Discharge status: Home / Self Care | Payer: BC Managed Care – PPO | Source: Ambulatory Visit | Attending: Oncology | Admitting: Oncology

## 2012-08-08 DIAGNOSIS — Z8546 Personal history of malignant neoplasm of prostate: Secondary | ICD-10-CM | POA: Insufficient documentation

## 2012-08-08 DIAGNOSIS — Z9079 Acquired absence of other genital organ(s): Secondary | ICD-10-CM | POA: Insufficient documentation

## 2012-08-08 DIAGNOSIS — Z9221 Personal history of antineoplastic chemotherapy: Secondary | ICD-10-CM | POA: Insufficient documentation

## 2012-08-08 MED ORDER — IOHEXOL 300 MG/ML  SOLN
100.0000 mL | Freq: Once | INTRAMUSCULAR | Status: AC | PRN
  Administered 2012-08-08: 100 mL via INTRAVENOUS

## 2012-08-31 ENCOUNTER — Ambulatory Visit (HOSPITAL_BASED_OUTPATIENT_CLINIC_OR_DEPARTMENT_OTHER): Payer: BC Managed Care – PPO

## 2012-08-31 ENCOUNTER — Telehealth: Payer: Self-pay | Admitting: Oncology

## 2012-08-31 ENCOUNTER — Other Ambulatory Visit (HOSPITAL_BASED_OUTPATIENT_CLINIC_OR_DEPARTMENT_OTHER): Payer: BC Managed Care – PPO | Admitting: Lab

## 2012-08-31 ENCOUNTER — Telehealth: Payer: Self-pay | Admitting: *Deleted

## 2012-08-31 ENCOUNTER — Ambulatory Visit (HOSPITAL_BASED_OUTPATIENT_CLINIC_OR_DEPARTMENT_OTHER): Payer: BC Managed Care – PPO | Admitting: Oncology

## 2012-08-31 VITALS — BP 148/88 | HR 86 | Temp 97.5°F | Resp 20 | Ht 67.5 in | Wt 187.6 lb

## 2012-08-31 VITALS — BP 141/74 | HR 74 | Temp 97.0°F

## 2012-08-31 DIAGNOSIS — D126 Benign neoplasm of colon, unspecified: Secondary | ICD-10-CM

## 2012-08-31 DIAGNOSIS — Z8546 Personal history of malignant neoplasm of prostate: Secondary | ICD-10-CM

## 2012-08-31 DIAGNOSIS — C50919 Malignant neoplasm of unspecified site of unspecified female breast: Secondary | ICD-10-CM

## 2012-08-31 DIAGNOSIS — D649 Anemia, unspecified: Secondary | ICD-10-CM

## 2012-08-31 DIAGNOSIS — D509 Iron deficiency anemia, unspecified: Secondary | ICD-10-CM

## 2012-08-31 DIAGNOSIS — R5381 Other malaise: Secondary | ICD-10-CM

## 2012-08-31 DIAGNOSIS — E109 Type 1 diabetes mellitus without complications: Secondary | ICD-10-CM

## 2012-08-31 DIAGNOSIS — C61 Malignant neoplasm of prostate: Secondary | ICD-10-CM

## 2012-08-31 DIAGNOSIS — Z86010 Personal history of colonic polyps: Secondary | ICD-10-CM

## 2012-08-31 DIAGNOSIS — F17200 Nicotine dependence, unspecified, uncomplicated: Secondary | ICD-10-CM

## 2012-08-31 LAB — COMPREHENSIVE METABOLIC PANEL (CC13)
ALT: 8 U/L (ref 0–55)
AST: 14 U/L (ref 5–34)
Alkaline Phosphatase: 84 U/L (ref 40–150)
Chloride: 103 mEq/L (ref 98–107)
Creatinine: 0.7 mg/dL (ref 0.7–1.3)
Total Bilirubin: 0.32 mg/dL (ref 0.20–1.20)

## 2012-08-31 LAB — CBC WITH DIFFERENTIAL/PLATELET
BASO%: 1 % (ref 0.0–2.0)
EOS%: 3.2 % (ref 0.0–7.0)
HCT: 27.9 % — ABNORMAL LOW (ref 38.4–49.9)
LYMPH%: 27.7 % (ref 14.0–49.0)
MCH: 22.9 pg — ABNORMAL LOW (ref 27.2–33.4)
MCHC: 31.2 g/dL — ABNORMAL LOW (ref 32.0–36.0)
MCV: 73.5 fL — ABNORMAL LOW (ref 79.3–98.0)
MONO%: 6.1 % (ref 0.0–14.0)
NEUT%: 62 % (ref 39.0–75.0)
lymph#: 1.4 10*3/uL (ref 0.9–3.3)

## 2012-08-31 MED ORDER — HEPARIN SOD (PORK) LOCK FLUSH 100 UNIT/ML IV SOLN
500.0000 [IU] | Freq: Once | INTRAVENOUS | Status: AC
  Administered 2012-08-31: 500 [IU] via INTRAVENOUS
  Filled 2012-08-31: qty 5

## 2012-08-31 MED ORDER — ONDANSETRON HCL 8 MG PO TABS: 8.0000 mg | ORAL_TABLET | Freq: Three times a day (TID) | ORAL | Status: DC | PRN

## 2012-08-31 MED ORDER — SODIUM CHLORIDE 0.9 % IJ SOLN
10.0000 mL | INTRAMUSCULAR | Status: DC | PRN
  Administered 2012-08-31: 10 mL via INTRAVENOUS
  Filled 2012-08-31: qty 10

## 2012-08-31 NOTE — Telephone Encounter (Signed)
Gave pt appt for January lab and chemo, Also gave pt appt for February and March 2014, MD, ML lab and chemo

## 2012-08-31 NOTE — Telephone Encounter (Signed)
Per staff phone call and POF I have schedueld appts.  JMW  

## 2012-08-31 NOTE — Progress Notes (Signed)
Hematology and Oncology Follow Up Visit  [facility] [id number] [birth year] 62 y.o. 08/31/2012 12:29 PM  CC:  [name], MD  [name], MD  [name], M.D.    Principle Diagnosis: This is a 62 year old gentleman with prostate cancer initially diagnosed in 2007.  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  Prior Therapy: 1. Status post laparoscopic prostatectomy and bilateral lymphadenectomy, postoperative PSA was 0.5. 2. He received salvage radiation therapy due to persistent elevation in his PSA. 3. Patient treated with Lupron and Casodex due to a rise in his PSA, subsequently treated with Casodex withdrawal. 4. 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 5. Patient treated with 10 cycles of Taxotere and prednisone.  Taxotere was reduced to 60 mg/sq m; last dose given was in October 2011.  However, PSA started to rise up to 250. 6. Zytiga 1000 mg daily beginning October 2011 through August 2013. 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 Zytiga was stopped due to a rising PSA up to 630.40 on 03/18/12.  Current therapy: Xtandi 160 mg daily started on 04/13/12.   Interim History:  Mr. Blossom presents today for a followup visit with is wife. He is doing better today. No longer ambulating with crutches. He is still on Xtandi since 04/2012 and is tolerating this well.  He had not had any abdominal pain, had not had any discomfort, had not had any fluid retention at this time.  He is more active since we last saw him. Had not had any major changes in his urine flow at this time. No new symptoms at this time. No seizure activity. No abdominal pain, nausea, or vomiting. No bleeding. He did report some constipation but still moves his bowels regularly.   Medications: I  have reviewed the patient's current medications. Current outpatient prescriptions:ACCU-CHEK AVIVA PLUS test strip, USE AS DIRECTED, Disp: 100 strip, Rfl: 3;  calcium-vitamin D 250-100 MG-UNIT per tablet, Take 1 tablet by mouth daily.  , Disp: , Rfl: ;  doxazosin (CARDURA) 8 MG tablet, TAKE 1 TABLET BY MOUTH EVERY DAY, Disp: 90 tablet, Rfl: 3;  fish oil-omega-3 fatty acids 1000 MG capsule, Take 1 g by mouth daily.  , Disp: , Rfl:  hydrochlorothiazide (HYDRODIURIL) 25 MG tablet, TAKE 1 TABLET BY MOUTH ONCE DAILY, Disp: 90 tablet, Rfl: 3;  insulin aspart protamine-insulin aspart (NOVOLOG 70/30) (70-30) 100 UNIT/ML injection, Inject 20 Units into the skin daily., Disp: , Rfl: ;  leuprolide (LUPRON) 30 MG injection, Inject 30 mg into the muscle every 4 (four) months.  , Disp: , Rfl:  lidocaine-prilocaine (EMLA) cream, APPLY TO PORTA-CATH SITE 30 MINS TO 2 HOURS PRIOR TO TREATMENT & COVER WITH PLASTIC WRAP, Disp: 30 g, Rfl: 0;  lisinopril (PRINIVIL,ZESTRIL) 10 MG tablet, TAKE 1 TABLET BY MOUTH ONCE DAILY, Disp: 90 tablet, Rfl: 3;  metFORMIN (GLUCOPHAGE) 850 MG tablet, Take 850 mg by mouth 2 (two) times daily with a meal., Disp: , Rfl:  ondansetron (ZOFRAN) 8 MG tablet, Take 1 tablet (8 mg total) by mouth every 8 (eight) hours as needed for nausea., Disp: 20 tablet, Rfl: 0;  senna (SENOKOT) 8.6 MG TABS, Take 1 tablet (8.6 mg total) by mouth daily., Disp: 30 each, Rfl: 6 No current facility-administered medications for this visit. Facility-Administered Medications Ordered in Other Visits: heparin lock flush  100 unit/mL, 500 Units, Intravenous, Once, [name], MD;  sodium chloride 0.9 % injection 10 mL, 10 mL, Intravenous, PRN, [name], MD  Allergies:  Allergies  Allergen Reactions  . Aprindine   . Bc Powder (Aspirin-Salicylamide-Caffeine)   . Penicillins     Past Medical History, Surgical history, Social history, and Family History were reviewed and updated.  Review of Systems: Constitutional:   Negative for fever, chills, night sweats, anorexia, weight loss, pain. Cardiovascular: no chest pain or dyspnea on exertion Respiratory: no cough, shortness of breath, or wheezing Neurological: negative Dermatological: negative ENT: negative Skin: Negative. Gastrointestinal: no abdominal pain, change in bowel habits, or black or bloody stools Genito-Urinary: no dysuria, trouble voiding, or hematuria Hematological and Lymphatic: negative Breast: negative Musculoskeletal: negative Remaining ROS negative.  Physical Exam: Blood pressure 148/88, pulse 86, temperature 97.5 F (36.4 C), temperature source Oral, resp. rate 20, height 5' 7.5" (1.715 m), weight 187 lb 9.6 oz (85.095 kg). ECOG: 1 General appearance: alert Head: Normocephalic, without obvious abnormality, atraumatic Neck: no adenopathy, no carotid bruit, no JVD, supple, symmetrical, trachea midline and thyroid not enlarged, symmetric, no tenderness/mass/nodules Lymph nodes: Cervical, supraclavicular, and axillary nodes normal. Heart:regular rate and rhythm, S1, S2 normal, no murmur, click, rub or gallop Lung:chest clear, no wheezing, rales, normal symmetric air entry Abdomen: soft, non-tender, without masses or organomegaly EXT:no erythema, induration, or nodules   Lab Results: Lab Results  Component Value Date   WBC 5.2 08/31/2012   HGB 8.7* 08/31/2012   HCT 27.9* 08/31/2012   MCV 73.5* 08/31/2012   PLT 268 08/31/2012     Chemistry      Component Value Date/Time   NA 138 08/31/2012 1109   NA 135 03/18/2012 1541   NA 141 08/13/2011 1129   K 3.9 08/31/2012 1109   K 3.0* 03/18/2012 1541   K 3.7 08/13/2011 1129   CL 103 08/31/2012 1109   CL 98 03/18/2012 1541   CL 103 08/13/2011 1129   CO2 24 08/31/2012 1109   CO2 26 03/18/2012 1541   CO2 28 08/13/2011 1129   BUN 18.0 08/31/2012 1109   BUN 10 03/18/2012 1541   BUN 15 08/13/2011 1129   CREATININE 0.7 08/31/2012 1109   CREATININE 0.62 03/18/2012 1541   CREATININE 0.5* 08/13/2011 1129        Component Value Date/Time   CALCIUM 10.0 08/31/2012 1109   CALCIUM 9.5 03/18/2012 1541   CALCIUM 9.3 08/13/2011 1129   ALKPHOS 84 08/31/2012 1109   ALKPHOS 74 03/18/2012 1541   ALKPHOS 76 08/13/2011 1129   AST 14 08/31/2012 1109   AST 15 03/18/2012 1541   AST 12 08/13/2011 1129   ALT 8 08/31/2012 1109   ALT 10 03/18/2012 1541   BILITOT 0.32 08/31/2012 1109   BILITOT 0.4 03/18/2012 1541   BILITOT 0.50 08/13/2011 1129     Results for Baley, Lorimer Dwyane L (MRN [id number]) as of 08/31/2012 12:32  Ref. Range 06/28/2012 11:33 07/28/2012 10:33  PSA Latest Range: <=4.00 ng/mL 686.60 (H) 765.90 (H)    CT CHEST, ABDOMEN AND PELVIS WITH CONTRAST  Technique: Multidetector CT imaging of the chest, abdomen and  pelvis was performed following the standard protocol during bolus  administration of intravenous contrast.  Contrast: OMNIPAQUE IOHEXOL 300 MG/ML SOLN  Comparison: CT of the chest abdomen and pelvis 08/13/2011.  CT CHEST  Findings:  Mediastinum: Right internal jugular single lumen Port-A-Cath with  tip terminating in the distal superior vena cava. Heart size is  normal. Trace amount of pericardial fluid and/or thickening,  unlikely to be of hemodynamic significance at this time. No  associated pericardial calcification. There is atherosclerosis of  the thoracic aorta, the great vessels of the mediastinum and the  coronary arteries, including calcified atherosclerotic plaque in  the left main and left anterior descending coronary arteries. No  pathologically enlarged mediastinal or hilar lymph nodes. Esophagus  is unremarkable in appearance.  Lungs/Pleura: No suspicious appearing pulmonary nodules or masses  are identified. No acute consolidative airspace disease. A trace  amount of right-sided pleural fluid dependently. No left pleural  effusion.  Musculoskeletal: There are no aggressive appearing lytic or blastic  lesions noted in the visualized portions of the skeleton.  IMPRESSION:  1. No  findings to suggest metastatic disease to the thorax.  2. Atherosclerosis, including left main and LAD coronary artery  disease. Please note that although the presence of coronary artery  calcium documents the presence of coronary artery disease, the  severity of this disease and any potential stenosis cannot be  assessed on this non-gated CT examination. Assessment for  potential risk factor modification, dietary therapy or  pharmacologic therapy may be warranted, if clinically indicated.  CT ABDOMEN AND PELVIS  Findings:  Abdomen/Pelvis: A small area of ill-defined low attenuation  adjacent to the falciform ligament is unchanged, most compatible  with focal fatty infiltration. Several other sub centimeter low  attenuation well-defined lesions in the liver are similar to the  prior examination, and although too small to definitively  characterize, are statistically likely to represent tiny cysts. The  appearance of the gallbladder, pancreas, spleen, bilateral adrenal  glands and bilateral kidneys is unremarkable. Mild atherosclerosis  throughout the abdominal and pelvic vasculature, without aneurysm  or dissection.  Compared to the prior examination the previously noted pelvic mass  in the presacral area has significantly increased in size and is  now difficult to measure given its multilobulated and infiltrative  appearance, but this lesion measures approximately 9.1 x 9.0 x 7.3  cm. This lesion demonstrates heterogeneous internal enhancement  with some internal areas of decreased enhancement that may  represent a central regions of necrosis. This lesion is intimately  associated with the distal rectum which is inferiorly displaced by  the mass. There is loss of the normal intervening fat plane  between this lesion and the adjacent rectum. There is no  significant volume of ascites. However, there is a trace volume of  presacral fluid which is presumably reactive.  No  pneumoperitoneum. No pathologic distension of small bowel.  There are numerous borderline enlarged and mildly enlarged pelvic  lymph nodes, largest of which measures only 1.1 cm in short axis  (and has a normal fatty hilum) on image 86 of series 2 in the right  common iliac chain. Postoperative changes of radical prostatectomy  are noted. Urinary bladder is nearly decompressed and demonstrates  some wall thickening (this is likely accentuated by under  distension of the bladder).  Musculoskeletal: There are no aggressive appearing lytic or blastic  lesions noted in the visualized portions of the skeleton.  IMPRESSION:  1. Significant interval progression of disease with marked  enlargement of the infiltrative appearing pelvic mass, as discussed  above. There is a small amount of presacral fluid which is  presumably reactive and numerous borderline enlarged and mildly  enlarged pelvic lymph nodes, largest of which is in the right  common iliac chain.  2. Additional incidental findings, similar to prior study, as  above.   Impression and Plan:  This is a 62 year old gentleman with the following issues: 1. 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  Recently has been on  Xtandi. Tolerating this well. PSA is rising as of last month. CT scan was discussed today and it appears that he has progressed with a large pelvic mass. I discussed the treatment options with him at this time: Chemotherapy in the form of Jevtana vs possible radiation therapy. I will discuss this with Dr. Kathrynn Running to see if more radiation is still possible. If not, chemotherapy is  His only option. Risks and benefits of Jevtana discussed, he is agreeable to proceed. I will start at a lower dose for better tolerance (20 mg/m2).   2. Port-A-Cath management. It will be used for chemotherapy.   3. Avascular necrosis. Right hip surgery is on hold at this time.  There is no oncological reason why he can not have it at this time and he is clear from our standpoint.  4. Anemia that is multifactorial. Had a transfusion 3 months ago and Hemoglobin is now stable. .  5. Androgen deprivation.  Receives Lupron every four months. Next treatment will be on 10/2012. 6. Cardiac calcification: I recommended a cardiology evaluation in the future for risk stratification.   [facility] 1/22/201412:29 PM

## 2012-08-31 NOTE — Patient Instructions (Signed)
To see MD today

## 2012-09-01 NOTE — Progress Notes (Signed)
PSA results and CT scan addendum discussed with the patient over the phone today.  I reccommended a follow up with Dr. Kathrynn Running to discuss radiation option to be followed by systemic chemotherapy.

## 2012-09-06 ENCOUNTER — Encounter: Payer: Self-pay | Admitting: Radiation Oncology

## 2012-09-06 DIAGNOSIS — I10 Essential (primary) hypertension: Secondary | ICD-10-CM | POA: Insufficient documentation

## 2012-09-06 DIAGNOSIS — C61 Malignant neoplasm of prostate: Secondary | ICD-10-CM | POA: Insufficient documentation

## 2012-09-06 NOTE — Progress Notes (Addendum)
Re consult Prostate Cancer  Dx 2007 PSA 08/31/12=995.10  castration resistant disease metastatic with lymphadenopathy and pelvic  Presacral mass HX Radiation therapy treatment 08/10/2005-06/30/2006 Prostate Chemotherapy 10 cycles Taxotere and prednisone last given 05/2010 Zytiga 05/2010-03/2012,stopped due to rising PSA, prednisone stopped to development of avascualr necrosis of the hip  Married,2 children, Retired, no dysuria, no hesitancy, does have nocturia x3, no c/o pain, has c/o constipation now Takes MOM and sennakot No Hx Pacemaker    Allergies:PCN,B/C powder,Aprindine

## 2012-09-07 ENCOUNTER — Ambulatory Visit
Admission: RE | Admit: 2012-09-07 | Discharge: 2012-09-07 | Disposition: A | Discharge status: Home / Self Care | Payer: BC Managed Care – PPO | Source: Ambulatory Visit | Attending: Radiation Oncology | Admitting: Radiation Oncology

## 2012-09-07 ENCOUNTER — Ambulatory Visit: Payer: BC Managed Care – PPO

## 2012-09-07 ENCOUNTER — Other Ambulatory Visit: Payer: BC Managed Care – PPO | Admitting: Lab

## 2012-09-07 ENCOUNTER — Encounter: Payer: Self-pay | Admitting: Radiation Oncology

## 2012-09-07 ENCOUNTER — Telehealth: Payer: Self-pay | Admitting: *Deleted

## 2012-09-07 VITALS — BP 154/87 | HR 100 | Temp 98.3°F | Resp 20 | Ht 67.5 in | Wt 186.8 lb

## 2012-09-07 DIAGNOSIS — M87059 Idiopathic aseptic necrosis of unspecified femur: Secondary | ICD-10-CM | POA: Insufficient documentation

## 2012-09-07 DIAGNOSIS — C61 Malignant neoplasm of prostate: Secondary | ICD-10-CM

## 2012-09-07 DIAGNOSIS — C7951 Secondary malignant neoplasm of bone: Secondary | ICD-10-CM | POA: Insufficient documentation

## 2012-09-07 DIAGNOSIS — R630 Anorexia: Secondary | ICD-10-CM | POA: Insufficient documentation

## 2012-09-07 DIAGNOSIS — Z8546 Personal history of malignant neoplasm of prostate: Secondary | ICD-10-CM

## 2012-09-07 DIAGNOSIS — Z794 Long term (current) use of insulin: Secondary | ICD-10-CM | POA: Insufficient documentation

## 2012-09-07 DIAGNOSIS — C775 Secondary and unspecified malignant neoplasm of intrapelvic lymph nodes: Secondary | ICD-10-CM | POA: Insufficient documentation

## 2012-09-07 DIAGNOSIS — Z923 Personal history of irradiation: Secondary | ICD-10-CM | POA: Insufficient documentation

## 2012-09-07 DIAGNOSIS — Z79899 Other long term (current) drug therapy: Secondary | ICD-10-CM | POA: Insufficient documentation

## 2012-09-07 DIAGNOSIS — R5381 Other malaise: Secondary | ICD-10-CM | POA: Insufficient documentation

## 2012-09-07 DIAGNOSIS — C7952 Secondary malignant neoplasm of bone marrow: Secondary | ICD-10-CM

## 2012-09-07 DIAGNOSIS — Z510 Encounter for antineoplastic radiation therapy: Secondary | ICD-10-CM | POA: Insufficient documentation

## 2012-09-07 HISTORY — DX: Anemia, unspecified: D64.9

## 2012-09-07 HISTORY — DX: Allergy, unspecified, initial encounter: T78.40XA

## 2012-09-07 HISTORY — DX: Atherosclerotic heart disease of native coronary artery without angina pectoris: I25.10

## 2012-09-07 HISTORY — DX: Encounter for other specified aftercare: Z51.89

## 2012-09-07 NOTE — Progress Notes (Signed)
Please see the Nurse Progress Note in the MD Initial Consult Encounter for this patient. 

## 2012-09-07 NOTE — Progress Notes (Signed)
  Radiation Oncology         [phone] ________________________________  Name: Andrew Nelson MRN: [id number]  Date: 09/07/2012  DOB: November 01, 1950  SIMULATION AND TREATMENT PLANNING NOTE  DIAGNOSIS:  62 yo man with metastatic prostate cancer with symptomatic disease in the pre-sacral pelvis and T8.  NARRATIVE:  The patient was brought to the CT Simulation planning suite.  Identity was confirmed.  All relevant records and images related to the planned course of therapy were reviewed.  The patient freely provided informed written consent to proceed with treatment after reviewing the details related to the planned course of therapy. The consent form was witnessed and verified by the simulation staff.  Then, the patient was set-up in a stable reproducible supine position for radiation therapy.  A vacuum lock pillow device was custom fabricated to position his legs in a reproducible immobilized position.  Then, I performed a urethrogram under sterile conditions to identify the prostatic apex.  CT images were obtained.  Surface markings were placed.  The CT images were loaded into the planning software.  Then the prostate target and avoidance structures including the rectum, bladder, bowel and hips were contoured.  Treatment planning then occurred.  The radiation prescription was entered and confirmed.  A total of 3 complex treatment devices were fabricated. I have requested : Intensity Modulated Radiotherapy (IMRT) is medically necessary for this case for the following reason:  Previous treatment to this area.Marland Kitchen  SPECIAL TREATMENT PROCEDURE:  The planned course of therapy using radiation constitutes a special treatment procedure. Special care is required in the management of this patient for the following reasons.  Retreatment in a previously radiated area requiring careful monitoring of increased risk of toxicity due to overlap of previous treatment. The special nature of the planned course of radiotherapy  will require increased physician supervision and oversight to ensure patient's safety with optimal treatment outcomes.  PLAN:  The patient will receive 35 Gy in 14 fractions.  ________________________________  [name], M.D.

## 2012-09-07 NOTE — Telephone Encounter (Signed)
CALLED PATIENT TO INFORM OF TEST, SPOKE WITH PATIENT AND HE IS AWARE OF THIS TEST.

## 2012-09-07 NOTE — Progress Notes (Signed)
Radiation Oncology         [phone] ________________________________  Name: Andrew Nelson MRN: [id number]  Date: 09/07/2012  DOB: April 24, 1951  Follow-Up Visit Note  CC: Andrew Georges, MD  Andrew Core, MD  Diagnosis:   62 year old gentleman with prostate cancer initially diagnosed in 2007. 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 Laparoscopic prostatectomy July 2007 and bilateral lymphadenectomy, postoperative PSA was 0.5. 2. He received salvage radiation therapy due to PSA of 0.65 in October-November 2007 to 68.4 Gy 3. Patient treated with Lupron and Casodex due to a rise in his PSA, subsequently treated with Casodex withdrawal. 4. 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 5. Patient treated with 10 cycles of Taxotere and prednisone . Taxotere was reduced to 60 mg/sq m; last dose given was in October 2011. However, PSA started to rise up to 250. 6. Zytiga 1000 mg daily beginning October 2011 through August 2013. 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 Zytiga was stopped due to a rising PSA up to 630.40 on 03/18/12. 7. Most recently treated with Xtandi 160 mg daily started on 04/13/12. Now with progressive disease.  Interval Since Last Radiation:  6 years  Narrative:  The patient returns today for rising PSA and a pelvic mass.  His PSA is most recently 995     He also has an enlarging presacral mass causing constipation.    His previous radiation was mostly below the current tumor mass.  This lateral port film shows the largest of his radiation fields.   The patient's most recent CT also reveals a T8 left sided vertebral body met, and he does have pain in the left chest radiating around from the spine below the nipple and above the  umbilicus.  He has been referred to discuss possible radiation treatment options.   ALLERGIES:  is allergic to aprindine; bc powder; and penicillins.  Meds: Current Outpatient Prescriptions  Medication Sig Dispense Refill  . ACCU-CHEK AVIVA PLUS test strip USE AS DIRECTED  100 strip  3  . calcium-vitamin D 250-100 MG-UNIT per tablet Take 1 tablet by mouth daily.        Marland Kitchen doxazosin (CARDURA) 8 MG tablet TAKE 1 TABLET BY MOUTH EVERY DAY  90 tablet  3  . fish oil-omega-3 fatty acids 1000 MG capsule Take 1 g by mouth daily.        . hydrochlorothiazide (HYDRODIURIL) 25 MG tablet TAKE 1 TABLET BY MOUTH ONCE DAILY  90 tablet  3  . insulin aspart protamine-insulin aspart (NOVOLOG 70/30) (70-30) 100 UNIT/ML injection Inject 20 Units into the skin daily.      Marland Kitchen leuprolide (LUPRON) 30 MG injection Inject 30 mg into the muscle every 4 (four) months.        . lidocaine-prilocaine (EMLA) cream APPLY TO PORTA-CATH SITE 30 MINS TO 2 HOURS PRIOR TO TREATMENT & COVER WITH PLASTIC WRAP  30 g  0  . lisinopril (PRINIVIL,ZESTRIL) 10 MG tablet TAKE 1 TABLET BY MOUTH ONCE DAILY  90 tablet  3  . magnesium hydroxide (MILK OF MAGNESIA) 400 MG/5ML suspension Take 15 mLs by mouth daily as needed.      . metFORMIN (GLUCOPHAGE) 850 MG tablet Take 850 mg by mouth 2 (two) times daily with a meal.      .  senna (SENOKOT) 8.6 MG TABS Take 1 tablet (8.6 mg total) by mouth daily.  30 each  6  . ondansetron (ZOFRAN) 8 MG tablet Take 1 tablet (8 mg total) by mouth every 8 (eight) hours as needed for nausea.  20 tablet  0   No current facility-administered medications for this encounter.   Facility-Administered Medications Ordered in Other Encounters  Medication Dose Route Frequency Provider Last Rate Last Dose  . heparin lock flush 100 unit/mL  500 Units Intravenous Once Andrew Core, MD      . sodium chloride 0.9 % injection 10 mL  10 mL Intravenous PRN Andrew Core, MD        Physical Findings: The patient is in no  acute distress. Patient is alert and oriented.  height is 5' 7.5" (1.715 m) and weight is 186 lb 12.8 oz (84.732 kg). His oral temperature is 98.3 F (36.8 C). His blood pressure is 154/87 and his pulse is 100. His respiration is 20 and oxygen saturation is 99%.   He localizes pain to his right T8 distribution.  No significant changes.  Lab Findings: Lab Results  Component Value Date   WBC 5.2 08/31/2012   HGB 8.7* 08/31/2012   HCT 27.9* 08/31/2012   MCV 73.5* 08/31/2012   PLT 268 08/31/2012   Radiographic Findings: See HPI  Impression:  [name] has castration-resistant prostate cancer with symptomatic sites of involvemement at T8 and the pre-sacral pelvis.  He may benefit from palliative radiotherapy to both sites.  Re-irradiation of the pelvic mass will carry some risk for rectal complications and bladder complications, given his previous radiation, but, special care to conform the radiation distribution with IMRT could minimize these risks.  Plan:  Today, I talked to the patient and family about the findings and work-up thus far.  We discussed the natural history of metastatic prostate cancer and general treatment, highlighting the role or radiotherapy in the management.  We discussed the available radiation techniques, and focused on the details of logistics and delivery.  We reviewed the anticipated acute and late sequelae associated with radiation in this setting.  The patient was encouraged to ask questions that I answered to the best of my ability.  The patient would like to proceed with radiation and will be scheduled for CT simulation.  I would also recommend a re-staging bone scan.  I spent 60 minutes minutes face to face with the patient and more than 50% of that time was spent in counseling and/or coordination of care.   _____________________________________  Andrew Nelson, M.D.

## 2012-09-08 ENCOUNTER — Ambulatory Visit: Payer: BC Managed Care – PPO

## 2012-09-09 ENCOUNTER — Encounter (HOSPITAL_COMMUNITY)
Admission: RE | Admit: 2012-09-09 | Discharge: 2012-09-09 | Disposition: A | Discharge status: Home / Self Care | Payer: BC Managed Care – PPO | Source: Ambulatory Visit | Attending: Radiation Oncology | Admitting: Radiation Oncology

## 2012-09-09 DIAGNOSIS — C61 Malignant neoplasm of prostate: Secondary | ICD-10-CM | POA: Insufficient documentation

## 2012-09-09 DIAGNOSIS — C7952 Secondary malignant neoplasm of bone marrow: Secondary | ICD-10-CM

## 2012-09-09 DIAGNOSIS — Z8546 Personal history of malignant neoplasm of prostate: Secondary | ICD-10-CM

## 2012-09-09 DIAGNOSIS — C7951 Secondary malignant neoplasm of bone: Secondary | ICD-10-CM | POA: Insufficient documentation

## 2012-09-09 MED ORDER — TECHNETIUM TC 99M MEDRONATE IV KIT
25.0000 | PACK | Freq: Once | INTRAVENOUS | Status: AC | PRN
  Administered 2012-09-09: 25 via INTRAVENOUS

## 2012-09-15 ENCOUNTER — Ambulatory Visit: Payer: BC Managed Care – PPO

## 2012-09-16 ENCOUNTER — Ambulatory Visit
Admission: RE | Admit: 2012-09-16 | Discharge: 2012-09-16 | Disposition: A | Discharge status: Home / Self Care | Payer: BC Managed Care – PPO | Source: Ambulatory Visit | Attending: Radiation Oncology | Admitting: Radiation Oncology

## 2012-09-16 DIAGNOSIS — Z8546 Personal history of malignant neoplasm of prostate: Secondary | ICD-10-CM

## 2012-09-16 NOTE — Progress Notes (Signed)
  Radiation Oncology         [phone] ________________________________  Name: Andrew Nelson MRN: [id number]  Date: 09/16/2012  DOB: 01/16/1951  Simulation Verification Note  Status: outpatient  NARRATIVE: The patient was brought to the treatment unit and placed in the planned treatment position. The clinical setup was verified. Then port films were obtained and uploaded to the radiation oncology medical record software.  The treatment beams were carefully compared against the planned radiation fields. The position location and shape of the radiation fields was reviewed. They targeted volume of tissue appears to be appropriately covered by the radiation beams. Organs at risk appear to be excluded as planned.  Based on my personal review, I approved the simulation verification. The patient's treatment will proceed as planned.  ------------------------------------------------  [name], M.D.

## 2012-09-19 ENCOUNTER — Ambulatory Visit
Admission: RE | Admit: 2012-09-19 | Discharge: 2012-09-19 | Disposition: A | Discharge status: Home / Self Care | Payer: BC Managed Care – PPO | Source: Ambulatory Visit | Attending: Radiation Oncology | Admitting: Radiation Oncology

## 2012-09-19 ENCOUNTER — Encounter: Payer: Self-pay | Admitting: Radiation Oncology

## 2012-09-20 ENCOUNTER — Ambulatory Visit
Admission: RE | Admit: 2012-09-20 | Discharge: 2012-09-20 | Disposition: A | Discharge status: Home / Self Care | Payer: BC Managed Care – PPO | Source: Ambulatory Visit | Attending: Radiation Oncology | Admitting: Radiation Oncology

## 2012-09-20 VITALS — Wt 179.4 lb

## 2012-09-20 DIAGNOSIS — C7951 Secondary malignant neoplasm of bone: Secondary | ICD-10-CM

## 2012-09-20 MED ORDER — RADIAPLEXRX EX GEL: Freq: Once | CUTANEOUS | Status: DC

## 2012-09-20 MED ORDER — RADIAPLEXRX EX GEL
Freq: Once | CUTANEOUS | Status: AC
  Administered 2012-09-20: 16:00:00 via TOPICAL

## 2012-09-20 NOTE — Progress Notes (Signed)
pateint education given with rqadiaplex gel, radiation therapy and you book,instructions of use of radiaplex when to apply to back,patient declined sitz bath of erred, teach back given by patient .now

## 2012-09-20 NOTE — Addendum Note (Signed)
Encounter addended by: Lowella Petties, RN on: 09/20/2012  3:56 PM<BR>     Documentation filed: Vitals Section

## 2012-09-21 ENCOUNTER — Ambulatory Visit
Admission: RE | Admit: 2012-09-21 | Discharge: 2012-09-21 | Disposition: A | Discharge status: Home / Self Care | Payer: BC Managed Care – PPO | Source: Ambulatory Visit | Attending: Radiation Oncology | Admitting: Radiation Oncology

## 2012-09-22 ENCOUNTER — Ambulatory Visit: Admission: RE | Admit: 2012-09-22 | Payer: BC Managed Care – PPO | Source: Ambulatory Visit

## 2012-09-23 ENCOUNTER — Ambulatory Visit: Payer: BC Managed Care – PPO

## 2012-09-26 ENCOUNTER — Ambulatory Visit
Admission: RE | Admit: 2012-09-26 | Discharge: 2012-09-26 | Disposition: A | Discharge status: Home / Self Care | Payer: BC Managed Care – PPO | Source: Ambulatory Visit | Attending: Radiation Oncology | Admitting: Radiation Oncology

## 2012-09-26 ENCOUNTER — Telehealth: Payer: Self-pay | Admitting: Oncology

## 2012-09-26 NOTE — Telephone Encounter (Signed)
Gave pt appt for lab and MD for MArch 2014 , pt wants to cancel the [facility] 2014 lab, ML, chemo and injection appt ,ML and nurse aware of cancelleation

## 2012-09-27 ENCOUNTER — Ambulatory Visit
Admission: RE | Admit: 2012-09-27 | Discharge: 2012-09-27 | Disposition: A | Discharge status: Home / Self Care | Payer: BC Managed Care – PPO | Source: Ambulatory Visit | Attending: Radiation Oncology | Admitting: Radiation Oncology

## 2012-09-28 ENCOUNTER — Other Ambulatory Visit: Payer: BC Managed Care – PPO | Admitting: Lab

## 2012-09-28 ENCOUNTER — Ambulatory Visit: Payer: BC Managed Care – PPO

## 2012-09-28 ENCOUNTER — Ambulatory Visit
Admission: RE | Admit: 2012-09-28 | Discharge: 2012-09-28 | Disposition: A | Discharge status: Home / Self Care | Payer: BC Managed Care – PPO | Source: Ambulatory Visit | Attending: Radiation Oncology | Admitting: Radiation Oncology

## 2012-09-28 ENCOUNTER — Ambulatory Visit: Payer: BC Managed Care – PPO | Admitting: Oncology

## 2012-09-29 ENCOUNTER — Ambulatory Visit
Admission: RE | Admit: 2012-09-29 | Discharge: 2012-09-29 | Disposition: A | Discharge status: Home / Self Care | Payer: BC Managed Care – PPO | Source: Ambulatory Visit | Attending: Radiation Oncology | Admitting: Radiation Oncology

## 2012-09-29 ENCOUNTER — Ambulatory Visit: Payer: BC Managed Care – PPO

## 2012-09-30 ENCOUNTER — Ambulatory Visit
Admission: RE | Admit: 2012-09-30 | Discharge: 2012-09-30 | Disposition: A | Discharge status: Home / Self Care | Payer: BC Managed Care – PPO | Source: Ambulatory Visit | Attending: Radiation Oncology | Admitting: Radiation Oncology

## 2012-09-30 ENCOUNTER — Encounter: Payer: Self-pay | Admitting: Radiation Oncology

## 2012-09-30 VITALS — BP 129/71 | HR 108 | Temp 98.7°F | Ht 67.5 in | Wt 180.8 lb

## 2012-09-30 DIAGNOSIS — C61 Malignant neoplasm of prostate: Secondary | ICD-10-CM

## 2012-09-30 NOTE — Progress Notes (Signed)
  Radiation Oncology         (336) [phone] ________________________________  Name: Andrew Nelson MRN: [id number]  Date: 09/30/2012  DOB: 14-May-1951  Weekly Radiation Therapy Management  Current Dose: 20 Gy     Planned Dose:  35 Gy  Narrative . . . . . . . . The patient presents for routine under treatment assessment.                                                               8 fractions to sacral region and T-Spine. Reports indigestion and burping with intermittent nausea , but states he "would rather not" take any antacids nor antiemetics. He has Zofran on his medication list , but states he does not want to use it at this time.Marland Kitchen He also reports loose stools , but no diarrhea and is also experiencing fatigue. He notes decrease in energy "about one hour after treatment " and he takes nap after treatment                                 Set-up films were reviewed.                                 The chart was checked. Physical Findings. . .  height is 5' 7.5" (1.715 m) and weight is 180 lb 12.8 oz (82.01 kg). His temperature is 98.7 F (37.1 C). His blood pressure is 129/71 and his pulse is 108. . Weight essentially stable.  No significant changes. Impression . . . . . . . The patient is  tolerating radiation. Plan . . . . . . . . . . . . Continue treatment as planned.  ________________________________  Artist Pais. Kathrynn Running, M.D.

## 2012-09-30 NOTE — Progress Notes (Signed)
8 fractions to sacral region and T-Spine.  Reports indigestion and burping with intermittent nausea , but states he "would rather not" take any antacids nor antiemetics. He has Zofran on his medication list , but states he does not want to use it at this time.Andrew Nelson  He also reports loose stools , but no diarrhea and is also experiencing fatigue.  He notes decrease in energy "about one hour after treatment " and he takes nap after treatment.

## 2012-10-03 ENCOUNTER — Ambulatory Visit
Admission: RE | Admit: 2012-10-03 | Discharge: 2012-10-03 | Disposition: A | Discharge status: Home / Self Care | Payer: BC Managed Care – PPO | Source: Ambulatory Visit | Attending: Radiation Oncology | Admitting: Radiation Oncology

## 2012-10-04 ENCOUNTER — Ambulatory Visit
Admission: RE | Admit: 2012-10-04 | Discharge: 2012-10-04 | Disposition: A | Discharge status: Home / Self Care | Payer: BC Managed Care – PPO | Source: Ambulatory Visit | Attending: Radiation Oncology | Admitting: Radiation Oncology

## 2012-10-05 ENCOUNTER — Ambulatory Visit
Admission: RE | Admit: 2012-10-05 | Discharge: 2012-10-05 | Disposition: A | Discharge status: Home / Self Care | Payer: BC Managed Care – PPO | Source: Ambulatory Visit | Attending: Radiation Oncology | Admitting: Radiation Oncology

## 2012-10-05 ENCOUNTER — Ambulatory Visit: Payer: BC Managed Care – PPO

## 2012-10-06 ENCOUNTER — Ambulatory Visit
Admission: RE | Admit: 2012-10-06 | Discharge: 2012-10-06 | Disposition: A | Discharge status: Home / Self Care | Payer: BC Managed Care – PPO | Source: Ambulatory Visit | Attending: Radiation Oncology | Admitting: Radiation Oncology

## 2012-10-07 ENCOUNTER — Ambulatory Visit
Admission: RE | Admit: 2012-10-07 | Discharge: 2012-10-07 | Disposition: A | Discharge status: Home / Self Care | Payer: BC Managed Care – PPO | Source: Ambulatory Visit | Attending: Radiation Oncology | Admitting: Radiation Oncology

## 2012-10-10 ENCOUNTER — Ambulatory Visit
Admission: RE | Admit: 2012-10-10 | Discharge: 2012-10-10 | Disposition: A | Discharge status: Home / Self Care | Payer: BC Managed Care – PPO | Source: Ambulatory Visit | Attending: Radiation Oncology | Admitting: Radiation Oncology

## 2012-10-10 ENCOUNTER — Encounter: Payer: Self-pay | Admitting: Radiation Oncology

## 2012-10-10 ENCOUNTER — Ambulatory Visit
Admission: RE | Admit: 2012-10-10 | Payer: BC Managed Care – PPO | Source: Ambulatory Visit | Admitting: Radiation Oncology

## 2012-10-10 DIAGNOSIS — C61 Malignant neoplasm of prostate: Secondary | ICD-10-CM

## 2012-10-10 DIAGNOSIS — C7951 Secondary malignant neoplasm of bone: Secondary | ICD-10-CM

## 2012-10-10 DIAGNOSIS — K209 Esophagitis, unspecified without bleeding: Secondary | ICD-10-CM

## 2012-10-10 MED ORDER — LOPERAMIDE HCL 2 MG PO TABS: 2.0000 mg | ORAL_TABLET | Freq: Four times a day (QID) | ORAL | Status: DC | PRN

## 2012-10-10 MED ORDER — SUCRALFATE 1 GM/10ML PO SUSP: 1.0000 g | Freq: Four times a day (QID) | ORAL | Status: DC

## 2012-10-10 NOTE — Progress Notes (Signed)
  Radiation Oncology         [phone] ________________________________  Name: Andrew Nelson MRN: [id number]  Date: 10/10/2012  DOB: [birth year]  Weekly Radiation Therapy Management  Current Dose: 35 Gy     Planned Dose:  35 Gy  Narrative . . . . . . . . The patient presents for the final under treatment assessment.                                  The patient has had esophagitis symptoms.                                 Set-up films were reviewed.                                 The chart was checked. Physical Findings. . . Weight essentially stable.  No significant changes. Impression . . . . . . . The patient tolerated radiation relatively well. Plan . . . . . . . . . . . . Complete radiation today as scheduled, and follow-up in one month. The patient was encouraged to call or return to the clinic in the interim for any worsening symptoms.  Try carafate.  ________________________________  [name], M.D.

## 2012-10-10 NOTE — Progress Notes (Addendum)
patient here  for final rad tx T7-T9 spine, patient alert,oriented x3, drinks oj in am not much, can't keep anything on his stomach, burning, up in his esophagus, tried prilosec hasn't helped either state, pain a level 5-6 not taking anything =for pain"I just don't want too",poor appetite, fatigued, loose stools 1-2 daily,  3:43 PM

## 2012-10-13 NOTE — Progress Notes (Signed)
  Radiation Oncology         (336) [phone] ________________________________  Name: Andrew Nelson MRN: [id number]  Date: 10/10/2012  DOB: June 04, 1951  End of Treatment Note  Diagnosis:  62 yo man with metastatic prostate cancer with symptomatic disease in the pre-sacral pelvis and T8  Indication for treatment:  Palliation of pain, prevention of spinal cord injury       Radiation treatment dates:  09/19/2012-3/3/201  Site/dose:    1.  The T-spine from T7-T9 was treated to 35 Gy in 14 fractions of 2.5 Gy. 2.  The presacral nodal mass was treated to 35 Gy in 14 fractions of 2.5 Gy.  Beams/energy:    1.  The T-spine from T7-T9 was treated with 6 MV PA and 15 MV AP beams shaping radiation around the targeted spine, while shielding the lungs and heart maximally 2.  The presacral nodal mass was treated with IMRT due to previous pelvic radiotherapy using 10FFF VMAT and daily cone beam CT to position.  Narrative: The patient tolerated radiation treatment relatively well.   The patient had esophagitis symptoms   Plan: The patient has completed radiation treatment. The patient will return to radiation oncology clinic for routine followup in one month. I advised them to call or return sooner if they have any questions or concerns related to their recovery or treatment. ________________________________  Artist Pais. Kathrynn Running, M.D.

## 2012-10-19 ENCOUNTER — Ambulatory Visit: Payer: BC Managed Care – PPO | Admitting: Oncology

## 2012-10-19 ENCOUNTER — Ambulatory Visit: Payer: BC Managed Care – PPO

## 2012-10-19 ENCOUNTER — Other Ambulatory Visit: Payer: BC Managed Care – PPO | Admitting: Lab

## 2012-10-20 ENCOUNTER — Ambulatory Visit: Payer: BC Managed Care – PPO

## 2012-10-20 ENCOUNTER — Other Ambulatory Visit: Payer: BC Managed Care – PPO | Admitting: Lab

## 2012-10-21 ENCOUNTER — Other Ambulatory Visit: Payer: Self-pay | Admitting: Internal Medicine

## 2012-10-24 ENCOUNTER — Telehealth: Payer: Self-pay | Admitting: Oncology

## 2012-10-24 ENCOUNTER — Other Ambulatory Visit: Payer: Self-pay | Admitting: Oncology

## 2012-10-24 DIAGNOSIS — C61 Malignant neoplasm of prostate: Secondary | ICD-10-CM

## 2012-10-24 NOTE — Telephone Encounter (Signed)
Talked to pt and gavehim appt for 10/26/12

## 2012-10-25 ENCOUNTER — Ambulatory Visit: Payer: BC Managed Care – PPO | Admitting: Lab

## 2012-10-25 ENCOUNTER — Ambulatory Visit: Payer: BC Managed Care – PPO | Admitting: Oncology

## 2012-10-25 ENCOUNTER — Ambulatory Visit: Payer: BC Managed Care – PPO

## 2012-10-25 ENCOUNTER — Other Ambulatory Visit (HOSPITAL_BASED_OUTPATIENT_CLINIC_OR_DEPARTMENT_OTHER): Payer: BC Managed Care – PPO | Admitting: Lab

## 2012-10-26 ENCOUNTER — Encounter: Payer: Self-pay | Admitting: Oncology

## 2012-10-26 ENCOUNTER — Ambulatory Visit: Payer: BC Managed Care – PPO | Admitting: Oncology

## 2012-10-26 ENCOUNTER — Telehealth: Payer: Self-pay | Admitting: *Deleted

## 2012-10-26 ENCOUNTER — Telehealth: Payer: Self-pay | Admitting: Oncology

## 2012-10-26 ENCOUNTER — Ambulatory Visit: Payer: BC Managed Care – PPO

## 2012-10-26 ENCOUNTER — Other Ambulatory Visit: Payer: BC Managed Care – PPO | Admitting: Lab

## 2012-10-26 ENCOUNTER — Ambulatory Visit (HOSPITAL_BASED_OUTPATIENT_CLINIC_OR_DEPARTMENT_OTHER): Payer: BC Managed Care – PPO | Admitting: Oncology

## 2012-10-26 ENCOUNTER — Encounter (HOSPITAL_COMMUNITY)
Admission: RE | Admit: 2012-10-26 | Discharge: 2012-10-26 | Disposition: A | Discharge status: Home / Self Care | Payer: BC Managed Care – PPO | Source: Ambulatory Visit | Attending: Oncology | Admitting: Oncology

## 2012-10-26 ENCOUNTER — Ambulatory Visit (HOSPITAL_BASED_OUTPATIENT_CLINIC_OR_DEPARTMENT_OTHER): Payer: BC Managed Care – PPO

## 2012-10-26 ENCOUNTER — Other Ambulatory Visit: Payer: Self-pay | Admitting: Oncology

## 2012-10-26 ENCOUNTER — Ambulatory Visit: Payer: BC Managed Care – PPO | Admitting: Lab

## 2012-10-26 VITALS — BP 111/73 | HR 111 | Temp 98.5°F | Resp 20 | Ht 67.5 in | Wt 173.3 lb

## 2012-10-26 VITALS — BP 117/64 | HR 111 | Temp 98.6°F

## 2012-10-26 DIAGNOSIS — Z5111 Encounter for antineoplastic chemotherapy: Secondary | ICD-10-CM

## 2012-10-26 DIAGNOSIS — Z86010 Personal history of colonic polyps: Secondary | ICD-10-CM

## 2012-10-26 DIAGNOSIS — D509 Iron deficiency anemia, unspecified: Secondary | ICD-10-CM

## 2012-10-26 DIAGNOSIS — R109 Unspecified abdominal pain: Secondary | ICD-10-CM

## 2012-10-26 DIAGNOSIS — C61 Malignant neoplasm of prostate: Secondary | ICD-10-CM

## 2012-10-26 DIAGNOSIS — D649 Anemia, unspecified: Secondary | ICD-10-CM

## 2012-10-26 DIAGNOSIS — Z452 Encounter for adjustment and management of vascular access device: Secondary | ICD-10-CM

## 2012-10-26 DIAGNOSIS — I10 Essential (primary) hypertension: Secondary | ICD-10-CM

## 2012-10-26 DIAGNOSIS — Z8546 Personal history of malignant neoplasm of prostate: Secondary | ICD-10-CM

## 2012-10-26 DIAGNOSIS — C779 Secondary and unspecified malignant neoplasm of lymph node, unspecified: Secondary | ICD-10-CM

## 2012-10-26 DIAGNOSIS — F17200 Nicotine dependence, unspecified, uncomplicated: Secondary | ICD-10-CM

## 2012-10-26 DIAGNOSIS — R5381 Other malaise: Secondary | ICD-10-CM

## 2012-10-26 DIAGNOSIS — D126 Benign neoplasm of colon, unspecified: Secondary | ICD-10-CM

## 2012-10-26 DIAGNOSIS — C50919 Malignant neoplasm of unspecified site of unspecified female breast: Secondary | ICD-10-CM

## 2012-10-26 DIAGNOSIS — E109 Type 1 diabetes mellitus without complications: Secondary | ICD-10-CM

## 2012-10-26 LAB — HOLD TUBE, BLOOD BANK

## 2012-10-26 LAB — CBC WITH DIFFERENTIAL/PLATELET
BASO%: 0.7 % (ref 0.0–2.0)
Eosinophils Absolute: 0.1 10*3/uL (ref 0.0–0.5)
LYMPH%: 10.9 % — ABNORMAL LOW (ref 14.0–49.0)
MCH: 22.8 pg — ABNORMAL LOW (ref 27.2–33.4)
MCHC: 31.3 g/dL — ABNORMAL LOW (ref 32.0–36.0)
MCV: 72.8 fL — ABNORMAL LOW (ref 79.3–98.0)
MONO%: 7.6 % (ref 0.0–14.0)
Platelets: 266 10*3/uL (ref 140–400)
RBC: 3.41 10*6/uL — ABNORMAL LOW (ref 4.20–5.82)

## 2012-10-26 LAB — COMPREHENSIVE METABOLIC PANEL (CC13)
Alkaline Phosphatase: 78 U/L (ref 40–150)
Glucose: 107 mg/dl — ABNORMAL HIGH (ref 70–99)
Sodium: 138 mEq/L (ref 136–145)
Total Bilirubin: 0.29 mg/dL (ref 0.20–1.20)
Total Protein: 6.7 g/dL (ref 6.4–8.3)

## 2012-10-26 MED ORDER — LEUPROLIDE ACETATE (4 MONTH) 30 MG IM KIT
30.0000 mg | PACK | Freq: Once | INTRAMUSCULAR | Status: AC
  Administered 2012-10-26: 30 mg via INTRAMUSCULAR
  Filled 2012-10-26: qty 30

## 2012-10-26 MED ORDER — ALTEPLASE 2 MG IJ SOLR
2.0000 mg | Freq: Once | INTRAMUSCULAR | Status: AC
  Administered 2012-10-26: 2 mg
  Filled 2012-10-26: qty 2

## 2012-10-26 NOTE — Telephone Encounter (Signed)
Spoke with patient, gave him date and time for blood transfusion. 10/28/12 @ 8:30 in infusion.

## 2012-10-26 NOTE — Progress Notes (Signed)
Port accessed but no blood return after several attempts flushing and repositioning. Cath flo placed at 1426. Blood return noted at 1600. Labs drawn and port deaccessed.

## 2012-10-26 NOTE — Progress Notes (Signed)
Hematology and Oncology Follow Up Visit  Andrew Nelson [id number] [birth year] 62 y.o. 10/26/2012 4:22 PM  CC:  Andrew Purpura, MD  Andrew Nelson. Andrew Cooler, MD  Andrew Nelson, M.D.    Principle Diagnosis: This is a 62 year old gentleman with prostate cancer initially diagnosed in 2007.  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  Prior Therapy: 1. Status post laparoscopic prostatectomy and bilateral lymphadenectomy, postoperative PSA was 0.5. 2. He received salvage radiation therapy due to persistent elevation in his PSA. 3. Patient treated with Lupron and Casodex due to a rise in his PSA, subsequently treated with Casodex withdrawal. 4. 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 5. Patient treated with 10 cycles of Taxotere and prednisone.  Taxotere was reduced to 60 mg/sq m; last dose given was in October 2011.  However, PSA started to rise up to 250. 6. Zytiga 1000 mg daily beginning October 2011 through August 2013. 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 Zytiga was stopped due to a rising PSA up to 630.40 on 03/18/12. 7. Xtandi 160 mg daily started on 04/13/12. Stopped in January 2014. 8. Received XRT to his pelvis and T8 09/19/12 through 10/10/12.  Current therapy: Here to discuss starting Jevtana.   Interim History:  Andrew Nelson presents today for a followup visit with is wife. He is still having fatigue from XRT. Had nausea and vomiting with XRT, but now improved. Still having some abdominal discomfort as well. No fluid retention at this time. Had not had any major changes in his urine flow at this time. No bleeding. He did report some constipation but still moves his bowels regularly. Not currently taking any pain medication.  Medications: I have reviewed the patient's current  medications. Current outpatient prescriptions:ACCU-CHEK AVIVA PLUS test strip, USE AS DIRECTED, Disp: 100 strip, Rfl: 3;  calcium-vitamin D 250-100 MG-UNIT per tablet, Take 1 tablet by mouth daily.  , Disp: , Rfl: ;  doxazosin (CARDURA) 8 MG tablet, TAKE 1 TABLET BY MOUTH EVERY DAY, Disp: 90 tablet, Rfl: 3;  fish oil-omega-3 fatty acids 1000 MG capsule, Take 1 g by mouth daily.  , Disp: , Rfl:  hydrochlorothiazide (HYDRODIURIL) 25 MG tablet, TAKE 1 TABLET BY MOUTH ONCE DAILY, Disp: 90 tablet, Rfl: 3;  insulin aspart protamine-insulin aspart (NOVOLOG 70/30) (70-30) 100 UNIT/ML injection, Inject 20 Units into the skin daily., Disp: , Rfl: ;  leuprolide (LUPRON) 30 MG injection, Inject 30 mg into the muscle every 4 (four) months.  , Disp: , Rfl:  lidocaine-prilocaine (EMLA) cream, APPLY TO PORTA-CATH SITE 30 MINS TO 2 HOURS PRIOR TO TREATMENT & COVER WITH PLASTIC WRAP, Disp: 30 g, Rfl: 0;  lisinopril (PRINIVIL,ZESTRIL) 10 MG tablet, TAKE 1 TABLET BY MOUTH ONCE DAILY, Disp: 90 tablet, Rfl: 3;  loperamide (IMODIUM A-D) 2 MG tablet, Take 1 tablet (2 mg total) by mouth 4 (four) times daily as needed for diarrhea or loose stools., Disp: 30 tablet, Rfl: 0 magnesium hydroxide (MILK OF MAGNESIA) 400 MG/5ML suspension, Take 15 mLs by mouth daily as needed., Disp: , Rfl: ;  metFORMIN (GLUCOPHAGE) 850 MG tablet, Take 850 mg by mouth 2 (two) times daily with a meal., Disp: , Rfl: ;  NOVOLOG MIX 70/30 (70-30) 100 UNIT/ML injection, INJECT 20 UNITS EVERY DAY AS DIRECTED, Disp: 20 mL, Rfl: 3 ondansetron (ZOFRAN) 8 MG tablet,  Take 1 tablet (8 mg total) by mouth every 8 (eight) hours as needed for nausea., Disp: 20 tablet, Rfl: 0;  senna (SENOKOT) 8.6 MG TABS, Take 1 tablet (8.6 mg total) by mouth daily., Disp: 30 each, Rfl: 6;  sucralfate (CARAFATE) 1 GM/10ML suspension, Take 10 mLs (1 g total) by mouth 4 (four) times daily., Disp: 420 mL, Rfl: 2 No current facility-administered medications for this visit. Facility-Administered  Medications Ordered in Other Visits: heparin lock flush 100 unit/mL, 500 Units, Intravenous, Once, [name], MD;  sodium chloride 0.9 % injection 10 mL, 10 mL, Intravenous, PRN, [name], MD  Allergies:  Allergies  Allergen Reactions  . Aprindine   . Bc Powder (Aspirin-Salicylamide-Caffeine)   . Penicillins     Past Medical History, Surgical history, Social history, and Family History were reviewed and updated.  Review of Systems: Constitutional:  Negative for fever, chills, night sweats, anorexia, weight loss, pain. Cardiovascular: no chest pain or dyspnea on exertion Respiratory: no cough, shortness of breath, or wheezing Neurological: negative Dermatological: negative ENT: negative Skin: Negative. Gastrointestinal: no abdominal pain, change in bowel habits, or black or bloody stools Genito-Urinary: no dysuria, trouble voiding, or hematuria Hematological and Lymphatic: negative Breast: negative Musculoskeletal: negative Remaining ROS negative.  Physical Exam: Blood pressure 111/73, pulse 111, temperature 98.5 F (36.9 C), temperature source Oral, resp. rate 20, height 5' 7.5" (1.715 m), weight 173 lb 4.8 oz (78.608 kg). ECOG: 1 General appearance: alert Head: Normocephalic, without obvious abnormality, atraumatic Neck: no adenopathy, no carotid bruit, no JVD, supple, symmetrical, trachea midline and thyroid not enlarged, symmetric, no tenderness/mass/nodules Lymph nodes: Cervical, supraclavicular, and axillary nodes normal. Heart:regular rate and rhythm, S1, S2 normal, no murmur, click, rub or gallop Lung:chest clear, no wheezing, rales, normal symmetric air entry Abdomen: soft, non-tender, without masses or organomegaly EXT:no erythema, induration, or nodules   Lab Results: Lab Results  Component Value Date   WBC 4.6 10/26/2012   HGB 7.8* 10/26/2012   HCT 24.8* 10/26/2012   MCV 72.8* 10/26/2012   PLT 266 10/26/2012     Chemistry      Component Value  Date/Time   NA 138 08/31/2012 1109   NA 135 03/18/2012 1541   NA 141 08/13/2011 1129   K 3.9 08/31/2012 1109   K 3.0* 03/18/2012 1541   K 3.7 08/13/2011 1129   CL 103 08/31/2012 1109   CL 98 03/18/2012 1541   CL 103 08/13/2011 1129   CO2 24 08/31/2012 1109   CO2 26 03/18/2012 1541   CO2 28 08/13/2011 1129   BUN 18.0 08/31/2012 1109   BUN 10 03/18/2012 1541   BUN 15 08/13/2011 1129   CREATININE 0.7 08/31/2012 1109   CREATININE 0.62 03/18/2012 1541   CREATININE 0.5* 08/13/2011 1129      Component Value Date/Time   CALCIUM 10.0 08/31/2012 1109   CALCIUM 9.5 03/18/2012 1541   CALCIUM 9.3 08/13/2011 1129   ALKPHOS 84 08/31/2012 1109   ALKPHOS 74 03/18/2012 1541   ALKPHOS 76 08/13/2011 1129   AST 14 08/31/2012 1109   AST 15 03/18/2012 1541   AST 12 08/13/2011 1129   ALT 8 08/31/2012 1109   ALT 10 03/18/2012 1541   BILITOT 0.32 08/31/2012 1109   BILITOT 0.4 03/18/2012 1541   BILITOT 0.50 08/13/2011 1129     Results for Danielski, Narada L (MRN [id number]) as of 10/26/2012 13:00  Ref. Range 04/20/2012 10:26 05/17/2012 10:38 06/28/2012 11:33 07/28/2012 10:33 08/31/2012 11:09  PSA Latest Range: <=  4.00 ng/mL 498.50 (H) 469.80 (H) 686.60 (H) 765.90 (H) 995.10 (H)    Impression and Plan:  This is a 62 year old gentleman with the following issues: 1. 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  Recently has been on  Xtandi, but stopped due to disease progression. CT scan from 08/2012 showed disease progression with a large pelvic mass. He is s/p XRT to this mass. Discussed proceeding with Jevtana, but the patient does not feel as though he has recovered from XRT at this point. Plan is to re-evaluate him in about 3 weeks. I will start at a lower dose for better tolerance (20 mg/m2).   2. Port-A-Cath management. It will be used for chemotherapy.   3. Avascular necrosis. Right hip surgery is on hold at this time. There is no oncological reason why he can not have it at this time  and he is clear from our standpoint.  4. Anemia that is multifactorial. He is symptomatic today. Will set him up for 2 units PRBC this week. 5. Androgen deprivation.  Receives Lupron every four months. Given today. 6. Cardiac calcification: I recommended a cardiology evaluation in the future for risk stratification.   Clenton Pare 3/19/20144:22 PM

## 2012-10-27 ENCOUNTER — Telehealth: Payer: Self-pay | Admitting: *Deleted

## 2012-10-27 ENCOUNTER — Telehealth: Payer: Self-pay

## 2012-10-27 NOTE — Telephone Encounter (Signed)
Called patient with results of PSA done yesterday 

## 2012-10-27 NOTE — Telephone Encounter (Signed)
Message copied by Reesa Chew on Thu Oct 27, 2012 11:50 AM ------      Message from: Clenton Pare R      Created: Thu Oct 27, 2012  9:15 AM       Call pt and let him know PSA result. ------

## 2012-10-27 NOTE — Telephone Encounter (Signed)
Called and spoke with pt regarding blood transfusion for 3/21. Informed him of time change to 10:45. Pt voiced understanding and had no questions. TMB

## 2012-10-28 ENCOUNTER — Ambulatory Visit (HOSPITAL_BASED_OUTPATIENT_CLINIC_OR_DEPARTMENT_OTHER): Payer: BC Managed Care – PPO

## 2012-10-28 VITALS — BP 106/73 | HR 68 | Temp 98.7°F | Resp 16

## 2012-10-28 DIAGNOSIS — D509 Iron deficiency anemia, unspecified: Secondary | ICD-10-CM

## 2012-10-28 DIAGNOSIS — D649 Anemia, unspecified: Secondary | ICD-10-CM

## 2012-10-28 MED ORDER — DIPHENHYDRAMINE HCL 25 MG PO CAPS
25.0000 mg | ORAL_CAPSULE | Freq: Once | ORAL | Status: AC
  Administered 2012-10-28: 25 mg via ORAL

## 2012-10-28 MED ORDER — SODIUM CHLORIDE 0.9 % IJ SOLN
10.0000 mL | INTRAMUSCULAR | Status: AC | PRN
  Administered 2012-10-28: 10 mL
  Filled 2012-10-28: qty 10

## 2012-10-28 MED ORDER — HEPARIN SOD (PORK) LOCK FLUSH 100 UNIT/ML IV SOLN
500.0000 [IU] | Freq: Every day | INTRAVENOUS | Status: AC | PRN
  Administered 2012-10-28: 500 [IU]
  Filled 2012-10-28: qty 5

## 2012-10-28 MED ORDER — SODIUM CHLORIDE 0.9 % IV SOLN
250.0000 mL | Freq: Once | INTRAVENOUS | Status: AC
  Administered 2012-10-28: 250 mL via INTRAVENOUS

## 2012-10-28 MED ORDER — ACETAMINOPHEN 325 MG PO TABS
650.0000 mg | ORAL_TABLET | Freq: Once | ORAL | Status: AC
  Administered 2012-10-28: 650 mg via ORAL

## 2012-10-28 NOTE — Patient Instructions (Signed)
Blood Transfusion Information WHAT IS A BLOOD TRANSFUSION? A transfusion is the replacement of blood or some of its parts. Blood is made up of multiple cells which provide different functions.  Red blood cells carry oxygen and are used for blood loss replacement.  White blood cells fight against infection.  Platelets control bleeding.  Plasma helps clot blood.  Other blood products are available for specialized needs, such as hemophilia or other clotting disorders. BEFORE THE TRANSFUSION  Who gives blood for transfusions?   You may be able to donate blood to be used at a later date on yourself (autologous donation).  Relatives can be asked to donate blood. This is generally not any safer than if you have received blood from a stranger. The same precautions are taken to ensure safety when a relative's blood is donated.  Healthy volunteers who are fully evaluated to make sure their blood is safe. This is blood bank blood. Transfusion therapy is the safest it has ever been in the practice of medicine. Before blood is taken from a donor, a complete history is taken to make sure that person has no history of diseases nor engages in risky social behavior (examples are intravenous drug use or sexual activity with multiple partners). The donor's travel history is screened to minimize risk of transmitting infections, such as malaria. The donated blood is tested for signs of infectious diseases, such as HIV and hepatitis. The blood is then tested to be sure it is compatible with you in order to minimize the chance of a transfusion reaction. If you or a relative donates blood, this is often done in anticipation of surgery and is not appropriate for emergency situations. It takes many days to process the donated blood. RISKS AND COMPLICATIONS Although transfusion therapy is very safe and saves many lives, the main dangers of transfusion include:   Getting an infectious disease.  Developing a  transfusion reaction. This is an allergic reaction to something in the blood you were given. Every precaution is taken to prevent this. The decision to have a blood transfusion has been considered carefully by your caregiver before blood is given. Blood is not given unless the benefits outweigh the risks. AFTER THE TRANSFUSION  Right after receiving a blood transfusion, you will usually feel much better and more energetic. This is especially true if your red blood cells have gotten low (anemic). The transfusion raises the level of the red blood cells which carry oxygen, and this usually causes an energy increase.  The nurse administering the transfusion will monitor you carefully for complications. HOME CARE INSTRUCTIONS  No special instructions are needed after a transfusion. You may find your energy is better. Speak with your caregiver about any limitations on activity for underlying diseases you may have. SEEK MEDICAL CARE IF:   Your condition is not improving after your transfusion.  You develop redness or irritation at the intravenous (IV) site. SEEK IMMEDIATE MEDICAL CARE IF:  Any of the following symptoms occur over the next 12 hours:  Shaking chills.  You have a temperature by mouth above 102 F (38.9 C), not controlled by medicine.  Chest, back, or muscle pain.  People around you feel you are not acting correctly or are confused.  Shortness of breath or difficulty breathing.  Dizziness and fainting.  You get a rash or develop hives.  You have a decrease in urine output.  Your urine turns a dark color or changes to pink, red, or brown. Any of the following   symptoms occur over the next 10 days:  You have a temperature by mouth above 102 F (38.9 C), not controlled by medicine.  Shortness of breath.  Weakness after normal activity.  The white part of the eye turns yellow (jaundice).  You have a decrease in the amount of urine or are urinating less often.  Your  urine turns a dark color or changes to pink, red, or brown. Document Released: 07/24/2000 Document Revised: 10/19/2011 Document Reviewed: 03/12/2008 [employer] Patient Information 2013 [employer].  

## 2012-10-29 LAB — TYPE AND SCREEN: Unit division: 0

## 2012-10-31 ENCOUNTER — Encounter: Payer: Self-pay | Admitting: Internal Medicine

## 2012-11-07 NOTE — Progress Notes (Signed)
  Radiation Oncology         [phone]) [phone] ________________________________  Name: Andrew Nelson MRN: [id number]  Date: 09/19/2012  DOB: 1951/06/07  Weekly Radiation Therapy Management  Current Dose: 2.5 Gy     Planned Dose:  35 Gy  Narrative . . . . . . . . The patient presents for routine under treatment assessment.                                                      The patient is without complaint following his first fraction.                                 Set-up films were reviewed.                                 The chart was checked. Physical Findings. . . ..  No significant changes. Impression . . . . . . . The patient is tolerating radiation. Plan . . . . . . . . . . . . Continue treatment as planned.  ________________________________  Artist Pais. Kathrynn Running, M.D.

## 2012-11-08 ENCOUNTER — Encounter: Payer: Self-pay | Admitting: Radiation Oncology

## 2012-11-10 ENCOUNTER — Ambulatory Visit
Admission: RE | Admit: 2012-11-10 | Discharge: 2012-11-10 | Disposition: A | Discharge status: Home / Self Care | Payer: BC Managed Care – PPO | Source: Ambulatory Visit | Attending: Radiation Oncology | Admitting: Radiation Oncology

## 2012-11-10 ENCOUNTER — Encounter: Payer: Self-pay | Admitting: Radiation Oncology

## 2012-11-10 VITALS — BP 126/70 | HR 99 | Temp 97.9°F | Resp 20 | Wt 178.6 lb

## 2012-11-10 DIAGNOSIS — Z8546 Personal history of malignant neoplasm of prostate: Secondary | ICD-10-CM

## 2012-11-10 DIAGNOSIS — C7951 Secondary malignant neoplasm of bone: Secondary | ICD-10-CM

## 2012-11-10 DIAGNOSIS — C61 Malignant neoplasm of prostate: Secondary | ICD-10-CM

## 2012-11-10 HISTORY — DX: Personal history of irradiation: Z92.3

## 2012-11-10 MED ORDER — OXYCODONE-ACETAMINOPHEN 10-325 MG PO TABS: 1.0000 | ORAL_TABLET | ORAL | Status: DC | PRN

## 2012-11-10 NOTE — Progress Notes (Signed)
Radiation Oncology         [id number]) [phone] ________________________________  Name: Andrew Nelson MRN: [id number]  Date: 11/10/2012  DOB: 1950-09-09  Follow-Up Visit Note  CC: [name], MD  [name], MD  Diagnosis:   62 yo man with metastatic prostate cancer s/p radiotherapy: 1. Prostatic Fossa salvage radiation therapy due to PSA of 0.65 in October-November 2007 to 68.4 Gy 2. T7-T9 was treated to 35 Gy from 09/19/2012-10/10/2012 for prevention of spinal cord injury  3.  The presacral nodal mass was treated to 35 Gy from 09/19/2012-10/10/2012 for Palliation of pain  Interval Since Last Radiation:  one month  Narrative:  The patient returns today for routine follow-up.  He describes some healing pain in the area of the lower sacrum which radiates down his left leg and at night throbs in the calf.  The patient stated pain is a 5 out of 10 pain scale. He says " I've been having problems since last radiation, ", these problems include nausea, difficulty with ambulating, and some skin irritation along his buttocks. Of note, the patient did require transfusion for anemia including radiation. The patient did get 2 units PRBC's 10/28/12, as a result, he notes that his energy is coming up a little bit,  the patient also describes having difficulty ambulating, slow getting up, and moving, stiffness.  ALLERGIES:  is allergic to aprindine; bc powder; and penicillins.  Meds: Current Outpatient Prescriptions  Medication Sig Dispense Refill  . ACCU-CHEK AVIVA PLUS test strip USE AS DIRECTED  100 strip  3  . calcium-vitamin D 250-100 MG-UNIT per tablet Take 1 tablet by mouth daily.        Marland Kitchen doxazosin (CARDURA) 8 MG tablet TAKE 1 TABLET BY MOUTH EVERY DAY  90 tablet  3  . fish oil-omega-3 fatty acids 1000 MG capsule Take 1 g by mouth daily.        . hydrochlorothiazide (HYDRODIURIL) 25 MG tablet TAKE 1 TABLET BY MOUTH ONCE DAILY  90 tablet  3  . insulin aspart protamine-insulin aspart (NOVOLOG  70/30) (70-30) 100 UNIT/ML injection Inject 20 Units into the skin daily.      Marland Kitchen leuprolide (LUPRON) 30 MG injection Inject 30 mg into the muscle every 4 (four) months.        . lidocaine-prilocaine (EMLA) cream APPLY TO PORTA-CATH SITE 30 MINS TO 2 HOURS PRIOR TO TREATMENT & COVER WITH PLASTIC WRAP  30 g  0  . lisinopril (PRINIVIL,ZESTRIL) 10 MG tablet TAKE 1 TABLET BY MOUTH ONCE DAILY  90 tablet  3  . metFORMIN (GLUCOPHAGE) 850 MG tablet Take 850 mg by mouth 2 (two) times daily with a meal.      . NOVOLOG MIX 70/30 (70-30) 100 UNIT/ML injection INJECT 20 UNITS EVERY DAY AS DIRECTED  20 mL  3  . ondansetron (ZOFRAN) 8 MG tablet Take 1 tablet (8 mg total) by mouth every 8 (eight) hours as needed for nausea.  20 tablet  0  . senna (SENOKOT) 8.6 MG TABS Take 1 tablet (8.6 mg total) by mouth daily.  30 each  6  . sucralfate (CARAFATE) 1 GM/10ML suspension Take 10 mLs (1 g total) by mouth 4 (four) times daily.  420 mL  2  . loperamide (IMODIUM A-D) 2 MG tablet Take 1 tablet (2 mg total) by mouth 4 (four) times daily as needed for diarrhea or loose stools.  30 tablet  0  . magnesium hydroxide (MILK OF MAGNESIA) 400 MG/5ML suspension Take  15 mLs by mouth daily as needed.       No current facility-administered medications for this encounter.   Facility-Administered Medications Ordered in Other Encounters  Medication Dose Route Frequency Provider Last Rate Last Dose  . heparin lock flush 100 unit/mL  500 Units Intravenous Once [name], MD      . sodium chloride 0.9 % injection 10 mL  10 mL Intravenous PRN [name], MD        Physical Findings: The patient is in no acute distress. Patient is alert and oriented.  weight is 178 lb 9.6 oz (81.012 kg). His oral temperature is 97.9 F (36.6 C). His blood pressure is 126/70 and his pulse is 99. His respiration is 20 and oxygen saturation is 99%. . Examination of the patient's skin does reveal some modest hyperpigmentation within the gluteal  crease perhaps related to skin buildup over the presacral lymph node target. The skin is intact with no evident breakdown or moist desquamation. The patient's motor strength is 5/5 throughout. He does ambulate slowly, but steadily.  No significant changes.  Impression:  The patient is recovering from the effects of radiation.  He is still suffering with some of his presenting pelvic pain.  Plan:  Today, I refilled the patient's Percocet, and reassured him that some continued benefit of radiation would be expected over the coming weeks. Hopefully, his pelvic pain will improve as his presacral lymph node mass responds to radiation.  _____________________________________  [name], M.D.

## 2012-11-10 NOTE — Progress Notes (Signed)
Patient here follow up s/p SRS T7=T9,presacral  From 09/19/2012-10/10/2012 Alert,oriented x3, slow gait, pain stated in lower sacrum and radiates down leg and at night throbs in calf, patient stated pain a 5 on 1-10 pain scale, I've been having problems since last radiation, ", nauseated, ambulating, says I was burnt on my buttocks, still has some tanning on front and back of chest, patient buttocks has 2 scant pink healing areas, no energy, wearing band around waist, patient did get 2 units PRBC's 10/28/12, energy coming up a little bit, patient having difficulty ambulating, slow getting up, and moving, stiffness chemotherapy not started as yet, appetite is fair, weak, 99% room air,  2:57 PM

## 2012-11-15 ENCOUNTER — Telehealth: Payer: Self-pay | Admitting: Oncology

## 2012-11-15 ENCOUNTER — Telehealth: Payer: Self-pay | Admitting: *Deleted

## 2012-11-15 ENCOUNTER — Ambulatory Visit (HOSPITAL_BASED_OUTPATIENT_CLINIC_OR_DEPARTMENT_OTHER): Payer: BC Managed Care – PPO | Admitting: Oncology

## 2012-11-15 ENCOUNTER — Encounter (HOSPITAL_BASED_OUTPATIENT_CLINIC_OR_DEPARTMENT_OTHER): Payer: BC Managed Care – PPO

## 2012-11-15 ENCOUNTER — Ambulatory Visit: Payer: BC Managed Care – PPO

## 2012-11-15 ENCOUNTER — Ambulatory Visit (HOSPITAL_BASED_OUTPATIENT_CLINIC_OR_DEPARTMENT_OTHER): Payer: BC Managed Care – PPO

## 2012-11-15 ENCOUNTER — Other Ambulatory Visit (HOSPITAL_BASED_OUTPATIENT_CLINIC_OR_DEPARTMENT_OTHER): Payer: BC Managed Care – PPO | Admitting: Lab

## 2012-11-15 ENCOUNTER — Other Ambulatory Visit: Payer: Self-pay | Admitting: Oncology

## 2012-11-15 VITALS — BP 128/65 | HR 76 | Temp 97.1°F | Resp 20

## 2012-11-15 VITALS — BP 129/76 | HR 125 | Temp 98.4°F | Resp 18 | Ht 67.5 in | Wt 178.3 lb

## 2012-11-15 DIAGNOSIS — C61 Malignant neoplasm of prostate: Secondary | ICD-10-CM

## 2012-11-15 DIAGNOSIS — Z8546 Personal history of malignant neoplasm of prostate: Secondary | ICD-10-CM

## 2012-11-15 DIAGNOSIS — D649 Anemia, unspecified: Secondary | ICD-10-CM

## 2012-11-15 DIAGNOSIS — E291 Testicular hypofunction: Secondary | ICD-10-CM

## 2012-11-15 DIAGNOSIS — C50919 Malignant neoplasm of unspecified site of unspecified female breast: Secondary | ICD-10-CM

## 2012-11-15 DIAGNOSIS — Z5111 Encounter for antineoplastic chemotherapy: Secondary | ICD-10-CM

## 2012-11-15 LAB — CBC WITH DIFFERENTIAL/PLATELET
BASO%: 0.6 % (ref 0.0–2.0)
HCT: 32.2 % — ABNORMAL LOW (ref 38.4–49.9)
MCHC: 30.7 g/dL — ABNORMAL LOW (ref 32.0–36.0)
MONO#: 0.3 10*3/uL (ref 0.1–0.9)
NEUT%: 79.6 % — ABNORMAL HIGH (ref 39.0–75.0)
RBC: 4.1 10*6/uL — ABNORMAL LOW (ref 4.20–5.82)
WBC: 4.8 10*3/uL (ref 4.0–10.3)
lymph#: 0.5 10*3/uL — ABNORMAL LOW (ref 0.9–3.3)
nRBC: 0 % (ref 0–0)

## 2012-11-15 LAB — HOLD TUBE, BLOOD BANK

## 2012-11-15 LAB — COMPREHENSIVE METABOLIC PANEL (CC13)
ALT: 7 U/L (ref 0–55)
AST: 19 U/L (ref 5–34)
Albumin: 3.2 g/dL — ABNORMAL LOW (ref 3.5–5.0)
Calcium: 9.8 mg/dL (ref 8.4–10.4)
Chloride: 104 mEq/L (ref 98–107)
Potassium: 3.8 mEq/L (ref 3.5–5.1)
Sodium: 141 mEq/L (ref 136–145)
Total Protein: 7.2 g/dL (ref 6.4–8.3)

## 2012-11-15 LAB — PSA: PSA: 404.6 ng/mL — ABNORMAL HIGH (ref <=4.00)

## 2012-11-15 MED ORDER — SODIUM CHLORIDE 0.9 % IJ SOLN
10.0000 mL | INTRAMUSCULAR | Status: DC | PRN
  Administered 2012-11-15: 10 mL
  Filled 2012-11-15: qty 10

## 2012-11-15 MED ORDER — DEXAMETHASONE SODIUM PHOSPHATE 4 MG/ML IJ SOLN
12.0000 mg | Freq: Once | INTRAMUSCULAR | Status: AC
  Administered 2012-11-15: 12 mg via INTRAVENOUS

## 2012-11-15 MED ORDER — HEPARIN SOD (PORK) LOCK FLUSH 100 UNIT/ML IV SOLN
500.0000 [IU] | Freq: Once | INTRAVENOUS | Status: AC | PRN
  Administered 2012-11-15: 500 [IU]
  Filled 2012-11-15: qty 5

## 2012-11-15 MED ORDER — SODIUM CHLORIDE 0.9 % IV SOLN
Freq: Once | INTRAVENOUS | Status: AC
  Administered 2012-11-15: 11:00:00 via INTRAVENOUS

## 2012-11-15 MED ORDER — FAMOTIDINE IN NACL 20-0.9 MG/50ML-% IV SOLN
20.0000 mg | Freq: Once | INTRAVENOUS | Status: AC
  Administered 2012-11-15: 20 mg via INTRAVENOUS

## 2012-11-15 MED ORDER — SODIUM CHLORIDE 0.9 % IV SOLN
20.5000 mg/m2 | Freq: Once | INTRAVENOUS | Status: AC
  Administered 2012-11-15: 40 mg via INTRAVENOUS
  Filled 2012-11-15: qty 4

## 2012-11-15 MED ORDER — ONDANSETRON 8 MG/50ML IVPB (CHCC)
8.0000 mg | Freq: Once | INTRAVENOUS | Status: AC
  Administered 2012-11-15: 8 mg via INTRAVENOUS

## 2012-11-15 MED ORDER — DIPHENHYDRAMINE HCL 50 MG/ML IJ SOLN
25.0000 mg | Freq: Once | INTRAMUSCULAR | Status: AC
  Administered 2012-11-15: 25 mg via INTRAVENOUS

## 2012-11-15 NOTE — Progress Notes (Signed)
15 minute vitals check.  Pt stable.  Jevtana continued.

## 2012-11-15 NOTE — Patient Instructions (Addendum)
[facility] Discharge Instructions for Patients Receiving Chemotherapy  Today you received the following chemotherapy agents Jevtana. To help prevent nausea and vomiting after your treatment, we encourage you to take your nausea medication as prescribed. Begin taking it as directed and take it as often as prescribed for the next 48-72 hours.   If you develop nausea and vomiting that is not controlled by your nausea medication, call the clinic. If it is after clinic hours your family physician or the after hours number for the clinic or go to the Emergency Department.   BELOW ARE SYMPTOMS THAT SHOULD BE REPORTED IMMEDIATELY:  *FEVER GREATER THAN 100.5 F  *CHILLS WITH OR WITHOUT FEVER  NAUSEA AND VOMITING THAT IS NOT CONTROLLED WITH YOUR NAUSEA MEDICATION  *UNUSUAL SHORTNESS OF BREATH  *UNUSUAL BRUISING OR BLEEDING  TENDERNESS IN MOUTH AND THROAT WITH OR WITHOUT PRESENCE OF ULCERS  *URINARY PROBLEMS  *BOWEL PROBLEMS  UNUSUAL RASH Items with * indicate a potential emergency and should be followed up as soon as possible.  One of the nurses will contact you 24 hours after your treatment. Please let the nurse know about any problems that you may have experienced. Feel free to call the clinic you have any questions or concerns. The clinic phone number is [phone].   I have been informed and understand all the instructions given to me. I know to contact the clinic, my physician, or go to the Emergency Department if any problems should occur. I do not have any questions at this time, but understand that I may call the clinic during office hours   should I have any questions or need assistance in obtaining follow up care.    __________________________________________  _____________  __________ Signature of Patient or Authorized Representative            Date                   Time    __________________________________________ Nurse's Signature

## 2012-11-15 NOTE — Progress Notes (Signed)
Hematology and Oncology Follow Up Visit  Andrew Nelson [id number] Aug 15, 1950 62 y.o. 11/15/2012 9:03 AM  CC:  Andrew Purpura, MD  [name], MD  [name], M.D.    Principle Diagnosis: This is a 62 year old gentleman with prostate cancer initially diagnosed in 2007.  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  Prior Therapy: 1. Status post laparoscopic prostatectomy and bilateral lymphadenectomy, postoperative PSA was 0.5. 2. He received salvage radiation therapy due to persistent elevation in his PSA. 3. Patient treated with Lupron and Casodex due to a rise in his PSA, subsequently treated with Casodex withdrawal. 4. 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 5. Patient treated with 10 cycles of Taxotere and prednisone.  Taxotere was reduced to 60 mg/sq m; last dose given was in October 2011.  However, PSA started to rise up to 250. 6. Zytiga 1000 mg daily beginning October 2011 through August 2013. 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 Zytiga was stopped due to a rising PSA up to 630.40 on 03/18/12. 7. Xtandi 160 mg daily started on 04/13/12. Stopped in January 2014. 8. Received XRT to his pelvis and T8 09/19/12 through 10/10/12.  Current therapy: Here to start cycle one of  Jevtana 20 mg/m2..   Interim History:  [name] presents today for a followup visit . He is still having fatigue from XRT but is much improved at this point. Still having some abdominal discomfort as well. No fluid retention at this time. Had not had any major changes in his urine flow at this time. No bleeding. He did report some constipation but still moves his bowels regularly. He is reporting left hip pain and takes percocet for that. His mobility is improved at this time.    Medications: I have reviewed the patient's current medications. Current outpatient prescriptions:ACCU-CHEK AVIVA PLUS test strip, USE AS DIRECTED, Disp: 100 strip, Rfl: 3;  calcium-vitamin D 250-100 MG-UNIT per tablet, Take 1 tablet by mouth daily.  , Disp: , Rfl: ;  doxazosin (CARDURA) 8 MG tablet, TAKE 1 TABLET BY MOUTH EVERY DAY, Disp: 90 tablet, Rfl: 3;  fish oil-omega-3 fatty acids 1000 MG capsule, Take 1 g by mouth daily.  , Disp: , Rfl:  hydrochlorothiazide (HYDRODIURIL) 25 MG tablet, TAKE 1 TABLET BY MOUTH ONCE DAILY, Disp: 90 tablet, Rfl: 3;  insulin aspart protamine-insulin aspart (NOVOLOG 70/30) (70-30) 100 UNIT/ML injection, Inject 20 Units into the skin daily., Disp: , Rfl: ;  leuprolide (LUPRON) 30 MG injection, Inject 30 mg into the muscle every 4 (four) months.  , Disp: , Rfl:  lidocaine-prilocaine (EMLA) cream, APPLY TO PORTA-CATH SITE 30 MINS TO 2 HOURS PRIOR TO TREATMENT & COVER WITH PLASTIC WRAP, Disp: 30 g, Rfl: 0;  lisinopril (PRINIVIL,ZESTRIL) 10 MG tablet, TAKE 1 TABLET BY MOUTH ONCE DAILY, Disp: 90 tablet, Rfl: 3;  loperamide (IMODIUM A-D) 2 MG tablet, Take 1 tablet (2 mg total) by mouth 4 (four) times daily as needed for diarrhea or loose stools., Disp: 30 tablet, Rfl: 0 magnesium hydroxide (MILK OF MAGNESIA) 400 MG/5ML suspension, Take 15 mLs by mouth daily as needed., Disp: , Rfl: ;  metFORMIN (GLUCOPHAGE) 850 MG tablet, Take 850 mg by mouth 2 (two) times daily with a meal., Disp: , Rfl: ;  NOVOLOG MIX 70/30 (70-30) 100 UNIT/ML injection, INJECT 20 UNITS  EVERY DAY AS DIRECTED, Disp: 20 mL, Rfl: 3 ondansetron (ZOFRAN) 8 MG tablet, Take 1 tablet (8 mg total) by mouth every 8 (eight) hours as needed for nausea., Disp: 20 tablet, Rfl: 0;  oxyCODONE-acetaminophen (PERCOCET) 10-325 MG per tablet, Take 1-2 tablets by mouth every 4 (four) hours as needed for pain., Disp: 120 tablet, Rfl: 0;  senna (SENOKOT) 8.6 MG TABS, Take 1 tablet (8.6 mg total) by mouth daily., Disp: 30 each, Rfl:  6 sucralfate (CARAFATE) 1 GM/10ML suspension, Take 10 mLs (1 g total) by mouth 4 (four) times daily., Disp: 420 mL, Rfl: 2 No current facility-administered medications for this visit. Facility-Administered Medications Ordered in Other Visits: heparin lock flush 100 unit/mL, 500 Units, Intravenous, Once, [name], MD;  sodium chloride 0.9 % injection 10 mL, 10 mL, Intravenous, PRN, [name], MD  Allergies:  Allergies  Allergen Reactions  . Aprindine   . Bc Powder (Aspirin-Salicylamide-Caffeine)   . Penicillins     Past Medical History, Surgical history, Social history, and Family History were reviewed and updated.  Review of Systems: Constitutional:  Negative for fever, chills, night sweats, anorexia, weight loss, pain. Cardiovascular: no chest pain or dyspnea on exertion Respiratory: no cough, shortness of breath, or wheezing Neurological: negative Dermatological: negative ENT: negative Skin: Negative. Gastrointestinal: no abdominal pain, change in bowel habits, or black or bloody stools Genito-Urinary: no dysuria, trouble voiding, or hematuria Hematological and Lymphatic: negative Breast: negative Musculoskeletal: negative Remaining ROS negative.  Physical Exam: Blood pressure 129/76, pulse 125, temperature 98.4 F (36.9 C), temperature source Oral, resp. rate 18, height 5' 7.5" (1.715 m), weight 178 lb 4.8 oz (80.876 kg). ECOG: 1 General appearance: alert Head: Normocephalic, without obvious abnormality, atraumatic Neck: no adenopathy, no carotid bruit, no JVD, supple, symmetrical, trachea midline and thyroid not enlarged, symmetric, no tenderness/mass/nodules Lymph nodes: Cervical, supraclavicular, and axillary nodes normal. Heart:regular rate and rhythm, S1, S2 normal, no murmur, click, rub or gallop Lung:chest clear, no wheezing, rales, normal symmetric air entry Abdomen: soft, non-tender, without masses or organomegaly EXT:no erythema, induration, or  nodules   Lab Results: Lab Results  Component Value Date   WBC 4.6 10/26/2012   HGB 7.8* 10/26/2012   HCT 24.8* 10/26/2012   MCV 72.8* 10/26/2012   PLT 266 10/26/2012     Chemistry      Component Value Date/Time   NA 138 10/26/2012 1301   NA 135 03/18/2012 1541   NA 141 08/13/2011 1129   K 3.5 10/26/2012 1301   K 3.0* 03/18/2012 1541   K 3.7 08/13/2011 1129   CL 103 10/26/2012 1301   CL 98 03/18/2012 1541   CL 103 08/13/2011 1129   CO2 26 10/26/2012 1301   CO2 26 03/18/2012 1541   CO2 28 08/13/2011 1129   BUN 17.0 10/26/2012 1301   BUN 10 03/18/2012 1541   BUN 15 08/13/2011 1129   CREATININE 0.7 10/26/2012 1301   CREATININE 0.62 03/18/2012 1541   CREATININE 0.5* 08/13/2011 1129      Component Value Date/Time   CALCIUM 9.5 10/26/2012 1301   CALCIUM 9.5 03/18/2012 1541   CALCIUM 9.3 08/13/2011 1129   ALKPHOS 78 10/26/2012 1301   ALKPHOS 74 03/18/2012 1541   ALKPHOS 76 08/13/2011 1129   AST 10 10/26/2012 1301   AST 15 03/18/2012 1541   AST 12 08/13/2011 1129   ALT 9 10/26/2012 1301   ALT 10 03/18/2012 1541   BILITOT 0.29 10/26/2012 1301   BILITOT 0.4 03/18/2012 1541  BILITOT 0.50 08/13/2011 1129     Results for KALI, AMBLER (MRN [id number]) as of 11/15/2012 08:38  Ref. Range 08/31/2012 11:09 10/26/2012 13:01  PSA Latest Range: <=4.00 ng/mL 995.10 (H) 375.00 (H)     Impression and Plan:  This is a 62 year old gentleman with the following issues: 1. 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  Recently has been on  Xtandi, but stopped due to disease progression. CT scan from 08/2012 showed disease progression with a large pelvic mass. He is s/p XRT to this mass. I  discussed proceeding with Jevtana, and he is ready to proceed with a reduced dose (20 mg/m2).   2. Port-A-Cath management. It will be used for chemotherapy.   3. Avascular necrosis. Right hip surgery is on hold at this time.  4. Anemia that is multifactorial. He is asymptomatic today.   5. Androgen deprivation.  Receives Lupron every four months. Given today. 6. Follow up: in 3 weeks for cycles 2. 7. Pain: he is on short acting percocet. If his pain persists, we can consider long acting agent such as OxyContin.   [facility] 4/8/20149:03 AM

## 2012-11-15 NOTE — Progress Notes (Signed)
Five minute vitals for first Jevtana.  Pt stable.

## 2012-11-15 NOTE — Progress Notes (Signed)
10 Minute vital check.  Pt stable.

## 2012-11-15 NOTE — Telephone Encounter (Signed)
Pt's case Manager nancy called regarding plan of care for pt. Faxed most recent progress note to case manager per request to [phone].

## 2012-11-15 NOTE — Telephone Encounter (Signed)
Received call from [place] in pharm to move in from 4/8 to 4/9. gv pt appt for April and May.

## 2012-11-15 NOTE — Progress Notes (Signed)
Pre vitals.  Jevtana started at [employer] [facility]

## 2012-11-16 ENCOUNTER — Telehealth: Payer: Self-pay | Admitting: *Deleted

## 2012-11-16 ENCOUNTER — Ambulatory Visit (HOSPITAL_BASED_OUTPATIENT_CLINIC_OR_DEPARTMENT_OTHER): Payer: BC Managed Care – PPO

## 2012-11-16 VITALS — BP 138/69 | HR 114 | Temp 98.5°F

## 2012-11-16 DIAGNOSIS — C61 Malignant neoplasm of prostate: Secondary | ICD-10-CM

## 2012-11-16 DIAGNOSIS — Z5189 Encounter for other specified aftercare: Secondary | ICD-10-CM

## 2012-11-16 DIAGNOSIS — Z8546 Personal history of malignant neoplasm of prostate: Secondary | ICD-10-CM

## 2012-11-16 DIAGNOSIS — C50919 Malignant neoplasm of unspecified site of unspecified female breast: Secondary | ICD-10-CM

## 2012-11-16 MED ORDER — PEGFILGRASTIM INJECTION 6 MG/0.6ML
6.0000 mg | Freq: Once | SUBCUTANEOUS | Status: AC
  Administered 2012-11-16: 6 mg via SUBCUTANEOUS
  Filled 2012-11-16: qty 0.6

## 2012-11-16 NOTE — Telephone Encounter (Signed)
Per staff message and POF I have scheduled appts.  JMW  

## 2012-11-16 NOTE — Telephone Encounter (Signed)
Patient here for Neulasta injection following 1st jevtana chemo treatment.  States that he is doing ok other than the hip and leg pain he is having.  The pain is from the radiation therapy.  He is not having any nausea, vomiting, or diarrhea.  He does know to call if he has any problems or concerns.

## 2012-11-16 NOTE — Telephone Encounter (Signed)
Message copied by Reesa Chew on Wed Nov 16, 2012  3:37 PM ------      Message from: [name]      Created: Wed Nov 16, 2012  8:55 AM       Please call his PSA. ------

## 2012-11-16 NOTE — Patient Instructions (Addendum)

## 2012-11-16 NOTE — Telephone Encounter (Signed)
Gave patient results of PSA drawn yesterday. 

## 2012-11-30 ENCOUNTER — Other Ambulatory Visit: Payer: Self-pay | Admitting: *Deleted

## 2012-11-30 DIAGNOSIS — Z8546 Personal history of malignant neoplasm of prostate: Secondary | ICD-10-CM

## 2012-11-30 DIAGNOSIS — C7952 Secondary malignant neoplasm of bone marrow: Secondary | ICD-10-CM

## 2012-11-30 MED ORDER — OXYCODONE-ACETAMINOPHEN 10-325 MG PO TABS: 1.0000 | ORAL_TABLET | ORAL | Status: DC | PRN

## 2012-11-30 MED ORDER — FENTANYL 25 MCG/HR TD PT72: 1.0000 | MEDICATED_PATCH | TRANSDERMAL | Status: DC

## 2012-11-30 NOTE — Telephone Encounter (Signed)
Patient calling to say he is out of pain medication and is still in pain, per dr Clelia Croft, ok to start on fentanyl patches. Patient notified scripts are ready for p/u.

## 2012-12-06 ENCOUNTER — Ambulatory Visit (HOSPITAL_BASED_OUTPATIENT_CLINIC_OR_DEPARTMENT_OTHER): Payer: BC Managed Care – PPO | Admitting: Oncology

## 2012-12-06 ENCOUNTER — Telehealth: Payer: Self-pay | Admitting: Oncology

## 2012-12-06 ENCOUNTER — Encounter: Payer: Self-pay | Admitting: Oncology

## 2012-12-06 ENCOUNTER — Other Ambulatory Visit (HOSPITAL_BASED_OUTPATIENT_CLINIC_OR_DEPARTMENT_OTHER): Payer: BC Managed Care – PPO | Admitting: Lab

## 2012-12-06 ENCOUNTER — Ambulatory Visit: Payer: BC Managed Care – PPO

## 2012-12-06 ENCOUNTER — Ambulatory Visit (HOSPITAL_BASED_OUTPATIENT_CLINIC_OR_DEPARTMENT_OTHER): Payer: BC Managed Care – PPO

## 2012-12-06 VITALS — BP 117/79 | HR 118 | Temp 99.6°F | Resp 18 | Ht 67.5 in | Wt 177.5 lb

## 2012-12-06 DIAGNOSIS — C61 Malignant neoplasm of prostate: Secondary | ICD-10-CM

## 2012-12-06 DIAGNOSIS — Z5111 Encounter for antineoplastic chemotherapy: Secondary | ICD-10-CM

## 2012-12-06 DIAGNOSIS — D649 Anemia, unspecified: Secondary | ICD-10-CM

## 2012-12-06 DIAGNOSIS — C779 Secondary and unspecified malignant neoplasm of lymph node, unspecified: Secondary | ICD-10-CM

## 2012-12-06 DIAGNOSIS — Z8546 Personal history of malignant neoplasm of prostate: Secondary | ICD-10-CM

## 2012-12-06 DIAGNOSIS — E291 Testicular hypofunction: Secondary | ICD-10-CM

## 2012-12-06 DIAGNOSIS — C50919 Malignant neoplasm of unspecified site of unspecified female breast: Secondary | ICD-10-CM

## 2012-12-06 LAB — CBC WITH DIFFERENTIAL/PLATELET
EOS%: 1.1 % (ref 0.0–7.0)
Eosinophils Absolute: 0.1 10*3/uL (ref 0.0–0.5)
LYMPH%: 9.4 % — ABNORMAL LOW (ref 14.0–49.0)
MCH: 24.5 pg — ABNORMAL LOW (ref 27.2–33.4)
MCV: 76.6 fL — ABNORMAL LOW (ref 79.3–98.0)
MONO%: 7 % (ref 0.0–14.0)
NEUT#: 5.3 10*3/uL (ref 1.5–6.5)
Platelets: 240 10*3/uL (ref 140–400)
RBC: 3.59 10*6/uL — ABNORMAL LOW (ref 4.20–5.82)

## 2012-12-06 LAB — COMPREHENSIVE METABOLIC PANEL (CC13)
AST: 15 U/L (ref 5–34)
Alkaline Phosphatase: 109 U/L (ref 40–150)
BUN: 10.1 mg/dL (ref 7.0–26.0)
Glucose: 170 mg/dl — ABNORMAL HIGH (ref 70–99)
Sodium: 137 mEq/L (ref 136–145)
Total Bilirubin: 0.36 mg/dL (ref 0.20–1.20)
Total Protein: 7.3 g/dL (ref 6.4–8.3)

## 2012-12-06 MED ORDER — DEXAMETHASONE SODIUM PHOSPHATE 4 MG/ML IJ SOLN
12.0000 mg | Freq: Once | INTRAMUSCULAR | Status: AC
  Administered 2012-12-06: 12 mg via INTRAVENOUS

## 2012-12-06 MED ORDER — SODIUM CHLORIDE 0.9 % IJ SOLN
10.0000 mL | INTRAMUSCULAR | Status: DC | PRN
  Administered 2012-12-06: 10 mL
  Filled 2012-12-06: qty 10

## 2012-12-06 MED ORDER — DEXTROSE 5 % IV SOLN: 20.5000 mg/m2 | Freq: Once | INTRAVENOUS | Status: DC

## 2012-12-06 MED ORDER — DIPHENHYDRAMINE HCL 50 MG/ML IJ SOLN
25.0000 mg | Freq: Once | INTRAMUSCULAR | Status: AC
  Administered 2012-12-06: 25 mg via INTRAVENOUS

## 2012-12-06 MED ORDER — HEPARIN SOD (PORK) LOCK FLUSH 100 UNIT/ML IV SOLN
500.0000 [IU] | Freq: Once | INTRAVENOUS | Status: AC | PRN
  Administered 2012-12-06: 500 [IU]
  Filled 2012-12-06: qty 5

## 2012-12-06 MED ORDER — FAMOTIDINE IN NACL 20-0.9 MG/50ML-% IV SOLN
20.0000 mg | Freq: Once | INTRAVENOUS | Status: AC
  Administered 2012-12-06: 20 mg via INTRAVENOUS

## 2012-12-06 MED ORDER — SODIUM CHLORIDE 0.9 % IV SOLN
20.0000 mg/m2 | Freq: Once | INTRAVENOUS | Status: AC
  Administered 2012-12-06: 40 mg via INTRAVENOUS
  Filled 2012-12-06: qty 4

## 2012-12-06 MED ORDER — ONDANSETRON 8 MG/50ML IVPB (CHCC)
8.0000 mg | Freq: Once | INTRAVENOUS | Status: AC
  Administered 2012-12-06: 8 mg via INTRAVENOUS

## 2012-12-06 MED ORDER — SODIUM CHLORIDE 0.9 % IV SOLN
Freq: Once | INTRAVENOUS | Status: AC
  Administered 2012-12-06: 15:00:00 via INTRAVENOUS

## 2012-12-06 NOTE — Progress Notes (Signed)
Hematology and Oncology Follow Up Visit  Andrew Nelson [id number] 08/18/1950 62 y.o. 12/06/2012 2:24 PM  CC:  Andrew Purpura, MD  Andrew Nelson. Andrew Cooler, MD  Andrew Nelson, M.D.    Principle Diagnosis: This is a 62 year old gentleman with prostate cancer initially diagnosed in 2007.  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  Prior Therapy: 1. Status post laparoscopic prostatectomy and bilateral lymphadenectomy, postoperative PSA was 0.5. 2. He received salvage radiation therapy due to persistent elevation in his PSA. 3. Patient treated with Lupron and Casodex due to a rise in his PSA, subsequently treated with Casodex withdrawal. 4. 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 5. Patient treated with 10 cycles of Taxotere and prednisone.  Taxotere was reduced to 60 mg/sq m; last dose given was in October 2011.  However, PSA started to rise up to 250. 6. Zytiga 1000 mg daily beginning October 2011 through August 2013. 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 Zytiga was stopped due to a rising PSA up to 630.40 on 03/18/12. 7. Xtandi 160 mg daily started on 04/13/12. Stopped in January 2014. 8. Received XRT to his pelvis and T8 09/19/12 through 10/10/12.  Current therapy: Here to start cycle one of  Jevtana 20 mg/m2..   Interim History:  Andrew Nelson presents today for a followup visit . He is still having fatigue but is much improved at this point. No fluid retention at this time. Had not had any major changes in his urine flow at this time. No bleeding. He did report some constipation but still moves his bowels regularly. He is reporting left hip pain and is on Fentanyl and percocet. His mobility is improved at this time. No chest pain, shortness of breath, dyspnea on exertion. No  abdominal pain, nausea, vomiting. No complaints of neuropathy.  Medications: I have reviewed the patient's current medications. Current outpatient prescriptions:ACCU-CHEK AVIVA PLUS test strip, USE AS DIRECTED, Disp: 100 strip, Rfl: 3;  calcium-vitamin D 250-100 MG-UNIT per tablet, Take 1 tablet by mouth daily.  , Disp: , Rfl: ;  doxazosin (CARDURA) 8 MG tablet, TAKE 1 TABLET BY MOUTH EVERY DAY, Disp: 90 tablet, Rfl: 3;  fentaNYL (DURAGESIC - DOSED MCG/HR) 25 MCG/HR, Place 1 patch (25 mcg total) onto the skin every 3 (three) days., Disp: 10 patch, Rfl: 0 fish oil-omega-3 fatty acids 1000 MG capsule, Take 1 g by mouth daily.  , Disp: , Rfl: ;  hydrochlorothiazide (HYDRODIURIL) 25 MG tablet, TAKE 1 TABLET BY MOUTH ONCE DAILY, Disp: 90 tablet, Rfl: 3;  insulin aspart protamine-insulin aspart (NOVOLOG 70/30) (70-30) 100 UNIT/ML injection, Inject 20 Units into the skin daily., Disp: , Rfl: ;  leuprolide (LUPRON) 30 MG injection, Inject 30 mg into the muscle every 4 (four) months.  , Disp: , Rfl:  lidocaine-prilocaine (EMLA) cream, APPLY TO PORTA-CATH SITE 30 MINS TO 2 HOURS PRIOR TO TREATMENT & COVER WITH PLASTIC WRAP, Disp: 30 g, Rfl: 0;  lisinopril (PRINIVIL,ZESTRIL) 10 MG tablet, TAKE 1 TABLET BY MOUTH ONCE DAILY, Disp: 90 tablet, Rfl: 3;  loperamide (IMODIUM A-D) 2 MG tablet, Take 1 tablet (2 mg total) by mouth 4 (four) times daily as needed for diarrhea or loose stools., Disp: 30 tablet, Rfl: 0 magnesium hydroxide (MILK OF MAGNESIA) 400 MG/5ML suspension, Take 15 mLs by mouth daily as needed., Disp: ,  Rfl: ;  metFORMIN (GLUCOPHAGE) 850 MG tablet, Take 850 mg by mouth 2 (two) times daily with a meal., Disp: , Rfl: ;  NOVOLOG MIX 70/30 (70-30) 100 UNIT/ML injection, INJECT 20 UNITS EVERY DAY AS DIRECTED, Disp: 20 mL, Rfl: 3 ondansetron (ZOFRAN) 8 MG tablet, Take 1 tablet (8 mg total) by mouth every 8 (eight) hours as needed for nausea., Disp: 20 tablet, Rfl: 0;  oxyCODONE-acetaminophen (PERCOCET) 10-325 MG per  tablet, Take 1-2 tablets by mouth every 4 (four) hours as needed for pain., Disp: 120 tablet, Rfl: 0;  senna (SENOKOT) 8.6 MG TABS, Take 1 tablet (8.6 mg total) by mouth daily., Disp: 30 each, Rfl: 6 sucralfate (CARAFATE) 1 GM/10ML suspension, Take 10 mLs (1 g total) by mouth 4 (four) times daily., Disp: 420 mL, Rfl: 2 No current facility-administered medications for this visit. Facility-Administered Medications Ordered in Other Visits: heparin lock flush 100 unit/mL, 500 Units, Intravenous, Once, Andrew Core, MD;  sodium chloride 0.9 % injection 10 mL, 10 mL, Intravenous, PRN, Andrew Core, MD  Allergies:  Allergies  Allergen Reactions  . Aprindine   . Bc Powder (Aspirin-Salicylamide-Caffeine)   . Penicillins     Past Medical History, Surgical history, Social history, and Family History were reviewed and updated.  Review of Systems: Constitutional:  Negative for fever, chills, night sweats, anorexia, weight loss, pain. Cardiovascular: no chest pain or dyspnea on exertion Respiratory: no cough, shortness of breath, or wheezing Neurological: negative Dermatological: negative ENT: negative Skin: Negative. Gastrointestinal: no abdominal pain, change in bowel habits, or black or bloody stools Genito-Urinary: no dysuria, trouble voiding, or hematuria Hematological and Lymphatic: negative Breast: negative Musculoskeletal: negative Remaining ROS negative.  Physical Exam: Blood pressure 117/79, pulse 118, temperature 99.6 F (37.6 C), temperature source Oral, resp. rate 18, height 5' 7.5" (1.715 m), weight 177 lb 8 oz (80.513 kg). ECOG: 1 General appearance: alert Head: Normocephalic, without obvious abnormality, atraumatic Neck: no adenopathy, no carotid bruit, no JVD, supple, symmetrical, trachea midline and thyroid not enlarged, symmetric, no tenderness/mass/nodules Lymph nodes: Cervical, supraclavicular, and axillary nodes normal. Heart:regular rate and rhythm, S1, S2 normal,  no murmur, click, rub or gallop Lung:chest clear, no wheezing, rales, normal symmetric air entry Abdomen: soft, non-tender, without masses or organomegaly EXT:no erythema, induration, or nodules   Lab Results: Lab Results  Component Value Date   WBC 6.4 12/06/2012   HGB 8.8* 12/06/2012   HCT 27.5* 12/06/2012   MCV 76.6* 12/06/2012   PLT 240 12/06/2012     Chemistry      Component Value Date/Time   NA 137 12/06/2012 1237   NA 135 03/18/2012 1541   NA 141 08/13/2011 1129   K 3.7 12/06/2012 1237   K 3.0* 03/18/2012 1541   K 3.7 08/13/2011 1129   CL 101 12/06/2012 1237   CL 98 03/18/2012 1541   CL 103 08/13/2011 1129   CO2 26 12/06/2012 1237   CO2 26 03/18/2012 1541   CO2 28 08/13/2011 1129   BUN 10.1 12/06/2012 1237   BUN 10 03/18/2012 1541   BUN 15 08/13/2011 1129   CREATININE 0.8 12/06/2012 1237   CREATININE 0.62 03/18/2012 1541   CREATININE 0.5* 08/13/2011 1129      Component Value Date/Time   CALCIUM 9.8 12/06/2012 1237   CALCIUM 9.5 03/18/2012 1541   CALCIUM 9.3 08/13/2011 1129   ALKPHOS 109 12/06/2012 1237   ALKPHOS 74 03/18/2012 1541   ALKPHOS 76 08/13/2011 1129   AST 15 12/06/2012 1237  AST 15 03/18/2012 1541   AST 12 08/13/2011 1129   ALT 7 12/06/2012 1237   ALT 10 03/18/2012 1541   BILITOT 0.36 12/06/2012 1237   BILITOT 0.4 03/18/2012 1541   BILITOT 0.50 08/13/2011 1129     Results for DEZMIN, KITTELSON (MRN [id number]) as of 12/06/2012 13:16  Ref. Range 10/26/2012 13:01 11/15/2012 08:59  PSA Latest Range: <=4.00 ng/mL 375.00 (H) 404.60 (H)    Impression and Plan:  This is a 62 year old gentleman with the following issues: 1. 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  Recently has been on  Xtandi, but stopped due to disease progression. CT scan from 08/2012 showed disease progression with a large pelvic mass. He is s/p XRT to this mass. He is now on Jevtana with a reduced dose (20 mg/m2).  Recommend that he proceed with cycle 2 today  without dose modification. His PSA is pending. 2. Port-A-Cath management. It will be used for chemotherapy.   3. Avascular necrosis. Right hip surgery is on hold at this time.  4. Anemia that is multifactorial. He is asymptomatic today.  5. Androgen deprivation.  Receives Lupron every four months. Due July 2014. 6. Follow up: in 3 weeks for cycles 3. 7. Pain: Controlled on a fentanyl patch and Percocet for breakthrough pain.   Clenton Pare 4/29/20142:24 PM

## 2012-12-06 NOTE — Telephone Encounter (Signed)
gv and printed appt sched and avs for pt  °

## 2012-12-06 NOTE — Patient Instructions (Signed)
[facility] Discharge Instructions for Patients Receiving Chemotherapy  Today you received the following chemotherapy agents Jevtana  To help prevent nausea and vomiting after your treatment, we encourage you to take your nausea medication as prescribed.  If you develop nausea and vomiting that is not controlled by your nausea medication, call the clinic. If it is after clinic hours your family physician or the after hours number for the clinic or go to the Emergency Department.   BELOW ARE SYMPTOMS THAT SHOULD BE REPORTED IMMEDIATELY:  *FEVER GREATER THAN 100.5 F  *CHILLS WITH OR WITHOUT FEVER  NAUSEA AND VOMITING THAT IS NOT CONTROLLED WITH YOUR NAUSEA MEDICATION  *UNUSUAL SHORTNESS OF BREATH  *UNUSUAL BRUISING OR BLEEDING  TENDERNESS IN MOUTH AND THROAT WITH OR WITHOUT PRESENCE OF ULCERS  *URINARY PROBLEMS  *BOWEL PROBLEMS  UNUSUAL RASH Items with * indicate a potential emergency and should be followed up as soon as possible.  One of the nurses will contact you 24 hours after your treatment. Please let the nurse know about any problems that you may have experienced. Feel free to call the clinic you have any questions or concerns. The clinic phone number is [phone].   I have been informed and understand all the instructions given to me. I know to contact the clinic, my physician, or go to the Emergency Department if any problems should occur. I do not have any questions at this time, but understand that I may call the clinic during office hours   should I have any questions or need assistance in obtaining follow up care.    __________________________________________  _____________  __________ Signature of Patient or Authorized Representative            Date                   Time    __________________________________________ Nurse's Signature

## 2012-12-07 ENCOUNTER — Ambulatory Visit (HOSPITAL_BASED_OUTPATIENT_CLINIC_OR_DEPARTMENT_OTHER): Payer: BC Managed Care – PPO

## 2012-12-07 ENCOUNTER — Telehealth: Payer: Self-pay | Admitting: *Deleted

## 2012-12-07 VITALS — BP 124/61 | HR 102 | Temp 98.9°F

## 2012-12-07 DIAGNOSIS — C61 Malignant neoplasm of prostate: Secondary | ICD-10-CM

## 2012-12-07 DIAGNOSIS — Z5189 Encounter for other specified aftercare: Secondary | ICD-10-CM

## 2012-12-07 DIAGNOSIS — Z8546 Personal history of malignant neoplasm of prostate: Secondary | ICD-10-CM

## 2012-12-07 MED ORDER — PEGFILGRASTIM INJECTION 6 MG/0.6ML
6.0000 mg | Freq: Once | SUBCUTANEOUS | Status: AC
  Administered 2012-12-07: 6 mg via SUBCUTANEOUS
  Filled 2012-12-07: qty 0.6

## 2012-12-07 NOTE — Telephone Encounter (Signed)
Per MD, notified pt PSA results down some. Pt verbalized understanding. No further concerns

## 2012-12-07 NOTE — Telephone Encounter (Signed)
Message copied by Zarra Geffert, Gerald Leitz on Wed Dec 07, 2012 11:55 AM ------      Message from: [name]      Created: Wed Dec 07, 2012  8:35 AM       Please call his PSA. Down some. ------

## 2012-12-27 ENCOUNTER — Other Ambulatory Visit (HOSPITAL_BASED_OUTPATIENT_CLINIC_OR_DEPARTMENT_OTHER): Payer: BC Managed Care – PPO | Admitting: Lab

## 2012-12-27 ENCOUNTER — Telehealth: Payer: Self-pay | Admitting: *Deleted

## 2012-12-27 ENCOUNTER — Other Ambulatory Visit: Payer: BC Managed Care – PPO | Admitting: Lab

## 2012-12-27 ENCOUNTER — Telehealth: Payer: Self-pay | Admitting: Oncology

## 2012-12-27 ENCOUNTER — Ambulatory Visit (HOSPITAL_BASED_OUTPATIENT_CLINIC_OR_DEPARTMENT_OTHER): Payer: BC Managed Care – PPO

## 2012-12-27 ENCOUNTER — Ambulatory Visit (HOSPITAL_BASED_OUTPATIENT_CLINIC_OR_DEPARTMENT_OTHER): Payer: BC Managed Care – PPO | Admitting: Oncology

## 2012-12-27 ENCOUNTER — Other Ambulatory Visit: Payer: Self-pay | Admitting: Oncology

## 2012-12-27 VITALS — BP 117/71 | HR 81 | Temp 98.1°F | Resp 18 | Ht 67.5 in | Wt 170.1 lb

## 2012-12-27 DIAGNOSIS — R5383 Other fatigue: Secondary | ICD-10-CM

## 2012-12-27 DIAGNOSIS — Z8546 Personal history of malignant neoplasm of prostate: Secondary | ICD-10-CM

## 2012-12-27 DIAGNOSIS — M87051 Idiopathic aseptic necrosis of right femur: Secondary | ICD-10-CM

## 2012-12-27 DIAGNOSIS — C61 Malignant neoplasm of prostate: Secondary | ICD-10-CM

## 2012-12-27 DIAGNOSIS — I10 Essential (primary) hypertension: Secondary | ICD-10-CM

## 2012-12-27 DIAGNOSIS — D509 Iron deficiency anemia, unspecified: Secondary | ICD-10-CM

## 2012-12-27 DIAGNOSIS — E860 Dehydration: Secondary | ICD-10-CM

## 2012-12-27 DIAGNOSIS — E109 Type 1 diabetes mellitus without complications: Secondary | ICD-10-CM

## 2012-12-27 DIAGNOSIS — D649 Anemia, unspecified: Secondary | ICD-10-CM

## 2012-12-27 DIAGNOSIS — Z86010 Personal history of colonic polyps: Secondary | ICD-10-CM

## 2012-12-27 DIAGNOSIS — C775 Secondary and unspecified malignant neoplasm of intrapelvic lymph nodes: Secondary | ICD-10-CM

## 2012-12-27 DIAGNOSIS — F17200 Nicotine dependence, unspecified, uncomplicated: Secondary | ICD-10-CM

## 2012-12-27 DIAGNOSIS — E291 Testicular hypofunction: Secondary | ICD-10-CM

## 2012-12-27 DIAGNOSIS — D126 Benign neoplasm of colon, unspecified: Secondary | ICD-10-CM

## 2012-12-27 LAB — COMPREHENSIVE METABOLIC PANEL (CC13)
AST: 18 U/L (ref 5–34)
Albumin: 3.5 g/dL (ref 3.5–5.0)
Alkaline Phosphatase: 96 U/L (ref 40–150)
BUN: 8.8 mg/dL (ref 7.0–26.0)
Glucose: 148 mg/dl — ABNORMAL HIGH (ref 70–99)
Potassium: 3.4 mEq/L — ABNORMAL LOW (ref 3.5–5.1)
Total Bilirubin: 0.31 mg/dL (ref 0.20–1.20)

## 2012-12-27 LAB — CBC WITH DIFFERENTIAL/PLATELET
Basophils Absolute: 0.1 10*3/uL (ref 0.0–0.1)
EOS%: 0.9 % (ref 0.0–7.0)
Eosinophils Absolute: 0.1 10*3/uL (ref 0.0–0.5)
HGB: 9.1 g/dL — ABNORMAL LOW (ref 13.0–17.1)
LYMPH%: 10.4 % — ABNORMAL LOW (ref 14.0–49.0)
MCH: 24.3 pg — ABNORMAL LOW (ref 27.2–33.4)
MCV: 77.6 fL — ABNORMAL LOW (ref 79.3–98.0)
MONO%: 6 % (ref 0.0–14.0)
Platelets: 206 10*3/uL (ref 140–400)
RBC: 3.74 10*6/uL — ABNORMAL LOW (ref 4.20–5.82)
RDW: 18.6 % — ABNORMAL HIGH (ref 11.0–14.6)

## 2012-12-27 MED ORDER — SODIUM CHLORIDE 0.9 % IV SOLN
INTRAVENOUS | Status: DC
  Administered 2012-12-27: 10:00:00 via INTRAVENOUS

## 2012-12-27 MED ORDER — SODIUM CHLORIDE 0.9 % IJ SOLN
10.0000 mL | INTRAMUSCULAR | Status: DC | PRN
  Administered 2012-12-27: 10 mL via INTRAVENOUS
  Filled 2012-12-27: qty 10

## 2012-12-27 MED ORDER — HEPARIN SOD (PORK) LOCK FLUSH 100 UNIT/ML IV SOLN
500.0000 [IU] | Freq: Once | INTRAVENOUS | Status: AC
  Administered 2012-12-27: 500 [IU] via INTRAVENOUS
  Filled 2012-12-27: qty 5

## 2012-12-27 NOTE — Patient Instructions (Addendum)
Dehydration, Adult Dehydration is when you lose more fluids from the body than you take in. Vital organs like the kidneys, brain, and heart cannot function without a proper amount of fluids and salt. Any loss of fluids from the body can cause dehydration.  CAUSES   Vomiting.  Diarrhea.  Excessive sweating.  Excessive urine output.  Fever. SYMPTOMS  Mild dehydration  Thirst.  Dry lips.  Slightly dry mouth. Moderate dehydration  Very dry mouth.  Sunken eyes.  Skin does not bounce back quickly when lightly pinched and released.  Dark urine and decreased urine production.  Decreased tear production.  Headache. Severe dehydration  Very dry mouth.  Extreme thirst.  Rapid, weak pulse (more than 100 beats per minute at rest).  Cold hands and feet.  Not able to sweat in spite of heat and temperature.  Rapid breathing.  Blue lips.  Confusion and lethargy.  Difficulty being awakened.  Minimal urine production.  No tears. DIAGNOSIS  Your caregiver will diagnose dehydration based on your symptoms and your exam. Blood and urine tests will help confirm the diagnosis. The diagnostic evaluation should also identify the cause of dehydration. TREATMENT  Treatment of mild or moderate dehydration can often be done at home by increasing the amount of fluids that you drink. It is best to drink small amounts of fluid more often. Drinking too much at one time can make vomiting worse. Refer to the home care instructions below. Severe dehydration needs to be treated at the hospital where you will probably be given intravenous (IV) fluids that contain water and electrolytes. HOME CARE INSTRUCTIONS   Ask your caregiver about specific rehydration instructions.  Drink enough fluids to keep your urine clear or pale yellow.  Drink small amounts frequently if you have nausea and vomiting.  Eat as you normally do.  Avoid:  Foods or drinks high in sugar.  Carbonated  drinks.  Juice.  Extremely hot or cold fluids.  Drinks with caffeine.  Fatty, greasy foods.  Alcohol.  Tobacco.  Overeating.  Gelatin desserts.  Wash your hands well to avoid spreading bacteria and viruses.  Only take over-the-counter or prescription medicines for pain, discomfort, or fever as directed by your caregiver.  Ask your caregiver if you should continue all prescribed and over-the-counter medicines.  Keep all follow-up appointments with your caregiver. SEEK MEDICAL CARE IF:  You have abdominal pain and it increases or stays in one area (localizes).  You have a rash, stiff neck, or severe headache.  You are irritable, sleepy, or difficult to awaken.  You are weak, dizzy, or extremely thirsty. SEEK IMMEDIATE MEDICAL CARE IF:   You are unable to keep fluids down or you get worse despite treatment.  You have frequent episodes of vomiting or diarrhea.  You have blood or green matter (bile) in your vomit.  You have blood in your stool or your stool looks black and tarry.  You have not urinated in 6 to 8 hours, or you have only urinated a small amount of very dark urine.  You have a fever.  You faint. MAKE SURE YOU:   Understand these instructions.  Will watch your condition.  Will get help right away if you are not doing well or get worse. Document Released: 07/27/2005 Document Revised: 10/19/2011 Document Reviewed: 03/16/2011 [employer] Patient Information 2013 [employer].  

## 2012-12-27 NOTE — Progress Notes (Signed)
Hematology and Oncology Follow Up Visit  Andrew Nelson [id number] 1950/10/29 62 y.o. 12/27/2012 10:53 AM  CC:  Andrew Purpura, MD  Andrew Hoes. [name], MD  Andrew Nelson, M.D.    Principle Diagnosis: This is a 62 year old gentleman with prostate cancer initially diagnosed in 2007.  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  Prior Therapy: 1. Status post laparoscopic prostatectomy and bilateral lymphadenectomy, postoperative PSA was 0.5. 2. He received salvage radiation therapy due to persistent elevation in his PSA. 3. Patient treated with Lupron and Casodex due to a rise in his PSA, subsequently treated with Casodex withdrawal. 4. 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 5. Patient treated with 10 cycles of Taxotere and prednisone.  Taxotere was reduced to 60 mg/sq m; last dose given was in October 2011.  However, PSA started to rise up to 250. 6. Zytiga 1000 mg daily beginning October 2011 through August 2013. 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 Zytiga was stopped due to a rising PSA up to 630.40 on 03/18/12. 7. Xtandi 160 mg daily started on 04/13/12. Stopped in January 2014. 8. Received XRT to his pelvis and T8 09/19/12 through 10/10/12.  Current therapy: He is on Jevtana 20 mg/m2. S/P 2 cycles.    Interim History:  [name] presents today for a followup visit . Since his last visit he has been doing poorly.He is still having fatigue that got a lot worse since his last chemo. His po intake is a lot worse and feels dehydrated. Had not had any major changes in his urine flow at this time. No bleeding. He did report some constipation but still moves his bowels regularly. He is reporting left hip pain and is off Fentanyl. His mobility is about the same. No chest pain,  shortness of breath, dyspnea on exertion. No abdominal pain, nausea, vomiting. No complaints of neuropathy. He had low grade temp but no fevers, chills or sweats.   Medications: I have reviewed the patient's current medications. Current Outpatient Prescriptions  Medication Sig Dispense Refill  . ACCU-CHEK AVIVA PLUS test strip USE AS DIRECTED  100 strip  3  . calcium-vitamin D 250-100 MG-UNIT per tablet Take 1 tablet by mouth daily.        Marland Kitchen doxazosin (CARDURA) 8 MG tablet TAKE 1 TABLET BY MOUTH EVERY DAY  90 tablet  3  . fish oil-omega-3 fatty acids 1000 MG capsule Take 1 g by mouth daily.        . hydrochlorothiazide (HYDRODIURIL) 25 MG tablet TAKE 1 TABLET BY MOUTH ONCE DAILY  90 tablet  3  . insulin aspart protamine-insulin aspart (NOVOLOG 70/30) (70-30) 100 UNIT/ML injection Inject 20 Units into the skin daily.      Marland Kitchen leuprolide (LUPRON) 30 MG injection Inject 30 mg into the muscle every 4 (four) months.        . lidocaine-prilocaine (EMLA) cream APPLY TO PORTA-CATH SITE 30 MINS TO 2 HOURS PRIOR TO TREATMENT & COVER WITH PLASTIC WRAP  30 g  0  . lisinopril (PRINIVIL,ZESTRIL) 10 MG tablet TAKE 1 TABLET BY MOUTH ONCE DAILY  90 tablet  3  . magnesium hydroxide (MILK OF MAGNESIA) 400 MG/5ML suspension Take 15 mLs by mouth daily as needed.      . metFORMIN (GLUCOPHAGE) 850 MG tablet Take 850 mg by mouth  2 (two) times daily with a meal.      . NOVOLOG MIX 70/30 (70-30) 100 UNIT/ML injection INJECT 20 UNITS EVERY DAY AS DIRECTED  20 mL  3  . ondansetron (ZOFRAN) 8 MG tablet Take 1 tablet (8 mg total) by mouth every 8 (eight) hours as needed for nausea.  20 tablet  0  . senna (SENOKOT) 8.6 MG TABS Take 1 tablet (8.6 mg total) by mouth daily.  30 each  6  . sucralfate (CARAFATE) 1 GM/10ML suspension Take 10 mLs (1 g total) by mouth 4 (four) times daily.  420 mL  2   No current facility-administered medications for this visit.   Facility-Administered Medications Ordered in Other Visits  Medication  Dose Route Frequency Provider Last Rate Last Dose  . heparin lock flush 100 unit/mL  500 Units Intravenous Once [name], MD      . sodium chloride 0.9 % injection 10 mL  10 mL Intravenous PRN [name], MD        Allergies:  Allergies  Allergen Reactions  . Aprindine   . Bc Powder (Aspirin-Salicylamide-Caffeine)   . Penicillins     Past Medical History, Surgical history, Social history, and Family History were reviewed and updated.  Review of Systems: Constitutional:  Negative for fever, chills, night sweats, anorexia, weight loss, pain. Cardiovascular: no chest pain or dyspnea on exertion Respiratory: no cough, shortness of breath, or wheezing Neurological: negative Dermatological: negative ENT: negative Skin: Negative. Gastrointestinal: no abdominal pain, change in bowel habits, or black or bloody stools Genito-Urinary: no dysuria, trouble voiding, or hematuria Hematological and Lymphatic: negative Breast: negative Musculoskeletal: negative Remaining ROS negative.  Physical Exam: Blood pressure 117/71, pulse 81, temperature 98.1 F (36.7 C), temperature source Oral, resp. rate 18, height 5' 7.5" (1.715 m), weight 170 lb 1.6 oz (77.157 kg), SpO2 99.00%. ECOG: 1 General appearance: alert Head: Normocephalic, without obvious abnormality, atraumatic Neck: no adenopathy, no carotid bruit, no JVD, supple, symmetrical, trachea midline and thyroid not enlarged, symmetric, no tenderness/mass/nodules Lymph nodes: Cervical, supraclavicular, and axillary nodes normal. Heart:regular rate and rhythm, S1, S2 normal, no murmur, click, rub or gallop Lung:chest clear, no wheezing, rales, normal symmetric air entry Abdomen: soft, non-tender, without masses or organomegaly EXT:no erythema, induration, or nodules   Lab Results: Lab Results  Component Value Date   WBC 7.6 12/27/2012   HGB 9.1* 12/27/2012   HCT 29.0* 12/27/2012   MCV 77.6* 12/27/2012   PLT 206 12/27/2012      Chemistry      Component Value Date/Time   NA 141 12/27/2012 0847   NA 135 03/18/2012 1541   NA 141 08/13/2011 1129   K 3.4* 12/27/2012 0847   K 3.0* 03/18/2012 1541   K 3.7 08/13/2011 1129   CL 103 12/27/2012 0847   CL 98 03/18/2012 1541   CL 103 08/13/2011 1129   CO2 26 12/27/2012 0847   CO2 26 03/18/2012 1541   CO2 28 08/13/2011 1129   BUN 8.8 12/27/2012 0847   BUN 10 03/18/2012 1541   BUN 15 08/13/2011 1129   CREATININE 0.7 12/27/2012 0847   CREATININE 0.62 03/18/2012 1541   CREATININE 0.5* 08/13/2011 1129      Component Value Date/Time   CALCIUM 9.8 12/27/2012 0847   CALCIUM 9.5 03/18/2012 1541   CALCIUM 9.3 08/13/2011 1129   ALKPHOS 96 12/27/2012 0847   ALKPHOS 74 03/18/2012 1541   ALKPHOS 76 08/13/2011 1129   AST 18 12/27/2012 0847  AST 15 03/18/2012 1541   AST 12 08/13/2011 1129   ALT 9 12/27/2012 0847   ALT 10 03/18/2012 1541   BILITOT 0.31 12/27/2012 0847   BILITOT 0.4 03/18/2012 1541   BILITOT 0.50 08/13/2011 1129     Results for JAVED, COTTO (MRN [id number]) as of 12/27/2012 10:55  Ref. Range 11/15/2012 08:59 12/06/2012 12:37  PSA Latest Range: <=4.00 ng/mL 404.60 (H) 344.60 (H)     Impression and Plan:  This is a 62 year old gentleman with the following issues: 1. 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  Recently has been on  Xtandi, but stopped due to disease progression. CT scan from 08/2012 showed disease progression with a large pelvic mass. He is s/p XRT to this mass. He is now on Jevtana with a reduced dose (20 mg/m2).  Due to fatigue and dehydration, I will hold cycle 3 today and give IVF today and tomorrow and we will revaluate in 3 weeks.  2. Port-A-Cath management. It will be used for chemotherapy.   3. Avascular necrosis. Right hip surgery is on hold at this time.  4. Anemia that is multifactorial. He is asymptomatic today.  5. Androgen deprivation.  Receives Lupron every four months. Due July 2014. 6. Follow up: in 3  weeks for cycles 3. 7. Pain: Controlled not really taking any pain medication at this time.   Maansi Wike 5/20/201410:53 AM

## 2012-12-27 NOTE — Telephone Encounter (Signed)
Per staff message and POF I have scheduled appts.  JMW  

## 2012-12-27 NOTE — Telephone Encounter (Signed)
gv and printed appt sched and avs for pt....MW added tx   °

## 2012-12-28 ENCOUNTER — Ambulatory Visit (HOSPITAL_BASED_OUTPATIENT_CLINIC_OR_DEPARTMENT_OTHER): Payer: BC Managed Care – PPO

## 2012-12-28 ENCOUNTER — Ambulatory Visit: Payer: BC Managed Care – PPO

## 2012-12-28 ENCOUNTER — Telehealth: Payer: Self-pay | Admitting: *Deleted

## 2012-12-28 DIAGNOSIS — C775 Secondary and unspecified malignant neoplasm of intrapelvic lymph nodes: Secondary | ICD-10-CM

## 2012-12-28 DIAGNOSIS — R5383 Other fatigue: Secondary | ICD-10-CM

## 2012-12-28 DIAGNOSIS — C61 Malignant neoplasm of prostate: Secondary | ICD-10-CM

## 2012-12-28 DIAGNOSIS — R5381 Other malaise: Secondary | ICD-10-CM

## 2012-12-28 MED ORDER — SODIUM CHLORIDE 0.9 % IV SOLN
Freq: Once | INTRAVENOUS | Status: AC
  Administered 2012-12-28: 14:00:00 via INTRAVENOUS

## 2012-12-28 NOTE — Telephone Encounter (Signed)
Message copied by Reesa Chew on Wed Dec 28, 2012  2:46 PM ------      Message from: [name]      Created: Wed Dec 28, 2012 10:03 AM       Please call his PSA. Stable. ------

## 2012-12-28 NOTE — Telephone Encounter (Signed)
Nurse diane bell to give patient results of PSA, done yesterday. he is in infusion, receiving IVF's

## 2012-12-28 NOTE — Patient Instructions (Signed)
Dehydration, Adult Dehydration is when you lose more fluids from the body than you take in. Vital organs like the kidneys, brain, and heart cannot function without a proper amount of fluids and salt. Any loss of fluids from the body can cause dehydration.  CAUSES   Vomiting.  Diarrhea.  Excessive sweating.  Excessive urine output.  Fever. SYMPTOMS  Mild dehydration  Thirst.  Dry lips.  Slightly dry mouth. Moderate dehydration  Very dry mouth.  Sunken eyes.  Skin does not bounce back quickly when lightly pinched and released.  Dark urine and decreased urine production.  Decreased tear production.  Headache. Severe dehydration  Very dry mouth.  Extreme thirst.  Rapid, weak pulse (more than 100 beats per minute at rest).  Cold hands and feet.  Not able to sweat in spite of heat and temperature.  Rapid breathing.  Blue lips.  Confusion and lethargy.  Difficulty being awakened.  Minimal urine production.  No tears. DIAGNOSIS  Your caregiver will diagnose dehydration based on your symptoms and your exam. Blood and urine tests will help confirm the diagnosis. The diagnostic evaluation should also identify the cause of dehydration. TREATMENT  Treatment of mild or moderate dehydration can often be done at home by increasing the amount of fluids that you drink. It is best to drink small amounts of fluid more often. Drinking too much at one time can make vomiting worse. Refer to the home care instructions below. Severe dehydration needs to be treated at the hospital where you will probably be given intravenous (IV) fluids that contain water and electrolytes. HOME CARE INSTRUCTIONS   Ask your caregiver about specific rehydration instructions.  Drink enough fluids to keep your urine clear or pale yellow.  Drink small amounts frequently if you have nausea and vomiting.  Eat as you normally do.  Avoid:  Foods or drinks high in sugar.  Carbonated  drinks.  Juice.  Extremely hot or cold fluids.  Drinks with caffeine.  Fatty, greasy foods.  Alcohol.  Tobacco.  Overeating.  Gelatin desserts.  Wash your hands well to avoid spreading bacteria and viruses.  Only take over-the-counter or prescription medicines for pain, discomfort, or fever as directed by your caregiver.  Ask your caregiver if you should continue all prescribed and over-the-counter medicines.  Keep all follow-up appointments with your caregiver. SEEK MEDICAL CARE IF:  You have abdominal pain and it increases or stays in one area (localizes).  You have a rash, stiff neck, or severe headache.  You are irritable, sleepy, or difficult to awaken.  You are weak, dizzy, or extremely thirsty. SEEK IMMEDIATE MEDICAL CARE IF:   You are unable to keep fluids down or you get worse despite treatment.  You have frequent episodes of vomiting or diarrhea.  You have blood or green matter (bile) in your vomit.  You have blood in your stool or your stool looks black and tarry.  You have not urinated in 6 to 8 hours, or you have only urinated a small amount of very dark urine.  You have a fever.  You faint. MAKE SURE YOU:   Understand these instructions.  Will watch your condition.  Will get help right away if you are not doing well or get worse. Document Released: 07/27/2005 Document Revised: 10/19/2011 Document Reviewed: 03/16/2011 [employer] Patient Information 2014 [employer].  

## 2012-12-28 NOTE — Progress Notes (Signed)
Patient with complaint of discomfort/pain to right side side around [place] site. States pain is intermittent and at worst is a 4 on scale of 10. I accessed port, no signs of edema, redness or irritation present around port site or neck area, flushes good with positive blood return. Patient states discomfort started yesterday "might have been during iv fluids." Patient has difficulty describing in detail his symptoms. Reviewed with Dr Clelia Croft, should symptoms become worse, radiate or pain should increase patient to call office or go to ED. Per Dr Clelia Croft, pt ok to take pain meds. Patient verbalized understanding and knows to call office with any questions or concerns.

## 2012-12-29 ENCOUNTER — Other Ambulatory Visit: Payer: Self-pay | Admitting: Oncology

## 2013-01-12 IMAGING — CT CT ABD-PELV W/ CM
2 of 5 series · 14 of 46 positions shown, 16 images · IV contrast (omnipaque)
Comparison: CT of the chest abdomen and pelvis 08/13/2011.

CT CHEST

***ADDENDUM*** CREATED: 09/01/2012 [DATE]

Upon further review of the images there is a new area of lucency in
the posterolateral aspect of the left T8 vertebral body with some
overlying soft tissue prominence best demonstrated on image 30 of
series 2, highly suspicious for a new bony metastasis., and the
left side of the L3 vertebral body on image 67 of series 2 there is
a very subtle lucency which is new compared to the prior
examination, and may represent either a metastatic lesion or a
developing Schmorl's node.  In the posterolateral aspect of the L5
vertebral body (image 81 of series 2) there is also a new lucent
lesion that is suspicious for metastatic lesion.  Correlation with
bone scan or PET-CT is recommended to better evaluate the full
extent of potential metastatic disease to the bone.
***END ADDENDUM*** SIGNED BY: Rtoyota Joshjax, M.D.
CLINICAL DATA: History of prostate cancer.  Status post
prostatectomy and chemotherapy.
CT CHEST, ABDOMEN AND PELVIS WITH CONTRAST
TECHNIQUE: Multidetector CT imaging of the chest, abdomen and
pelvis was performed following the standard protocol during bolus
administration of intravenous contrast.
Contrast: 100mL OMNIPAQUE IOHEXOL 300 MG/ML  SOLN

[Series 2: cap with st · axial · 0.75mm/px · z∈[-596,-56]mm · 11 of 122 slices shown, 13 images]
[im 7/122  soft-tissue]
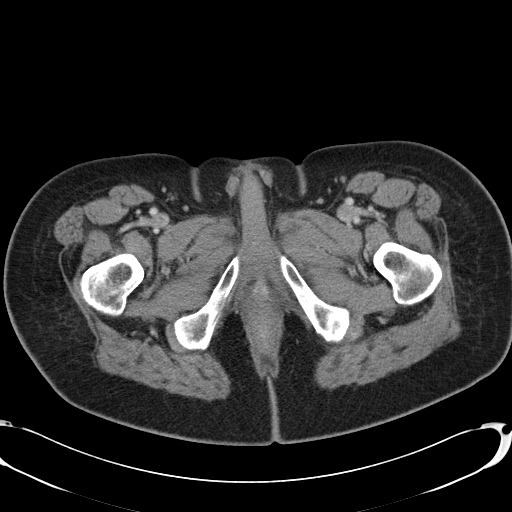
[im 7/122  bone]
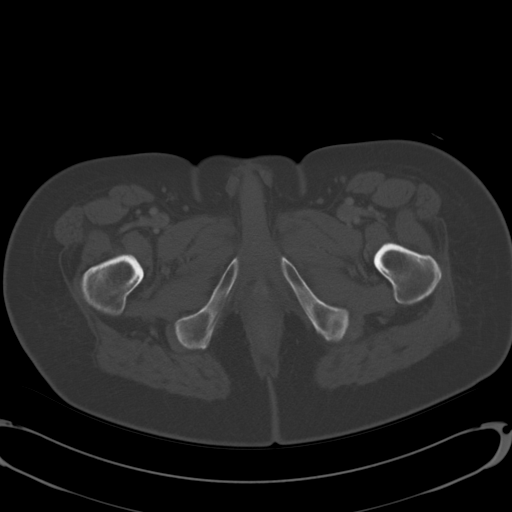
[im 21/122  soft-tissue]
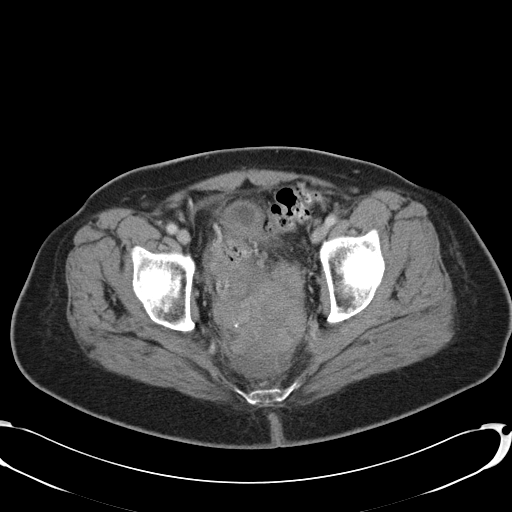
[im 27/122  soft-tissue]
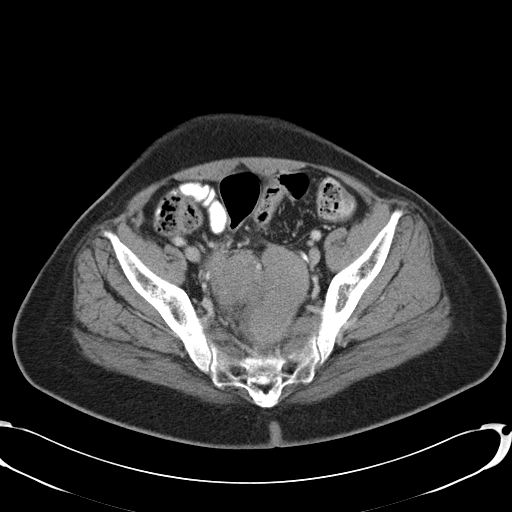
[im 41/122  soft-tissue]
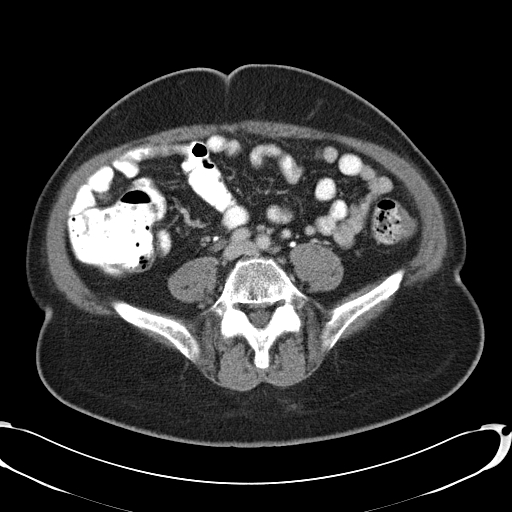
[im 48/122  soft-tissue]
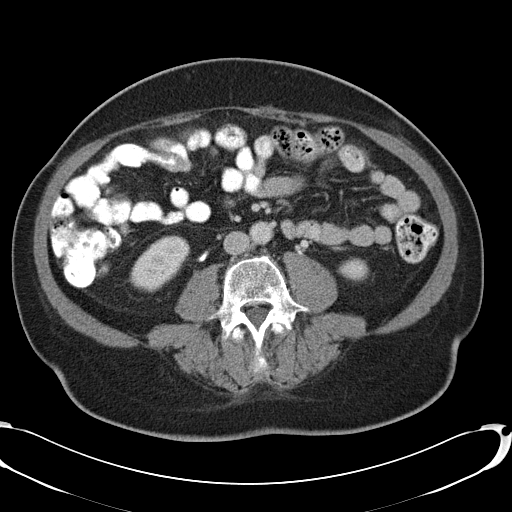
[im 61/122  soft-tissue]
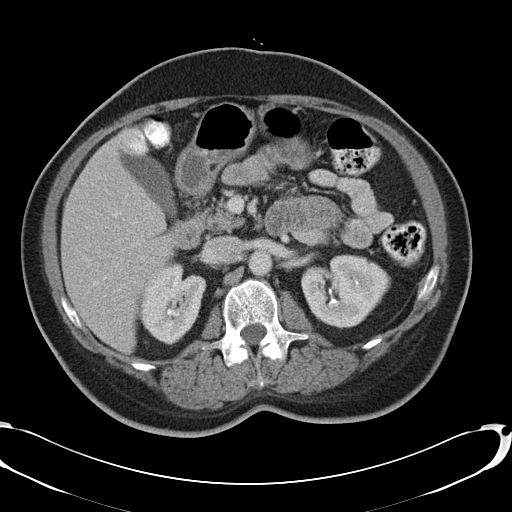
[im 74/122  soft-tissue]
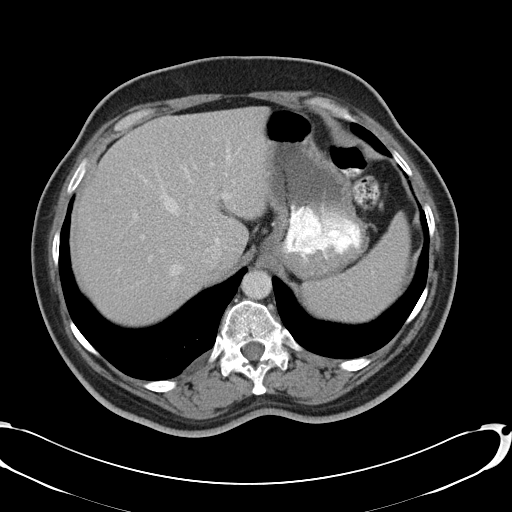
[im 81/122  soft-tissue]
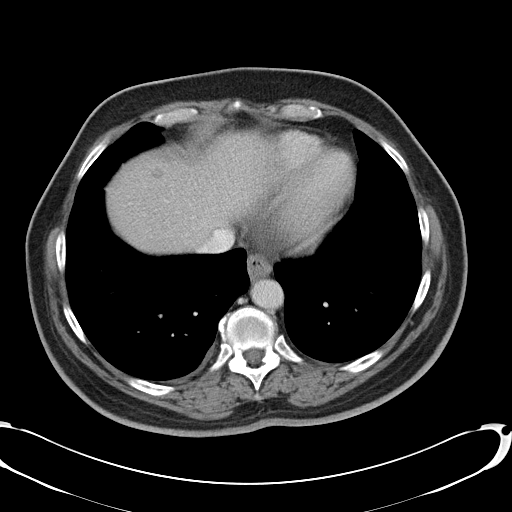
[im 95/122  soft-tissue]
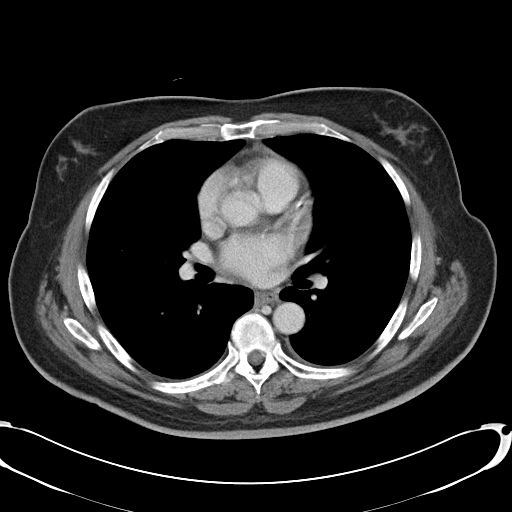
[im 95/122  bone]
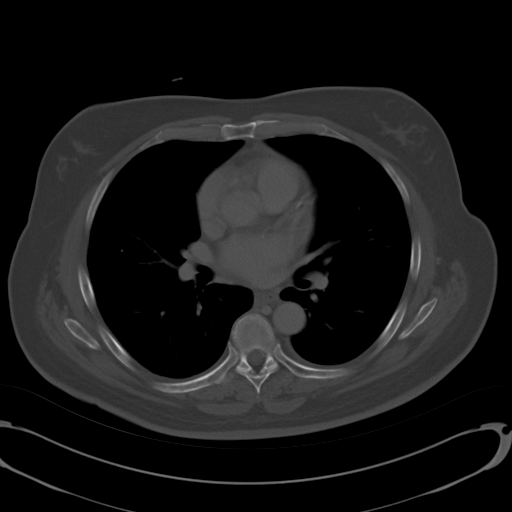
[im 101/122  soft-tissue]
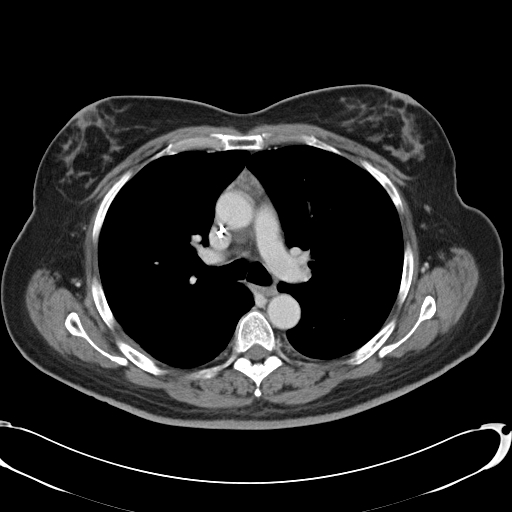
[im 115/122  soft-tissue]
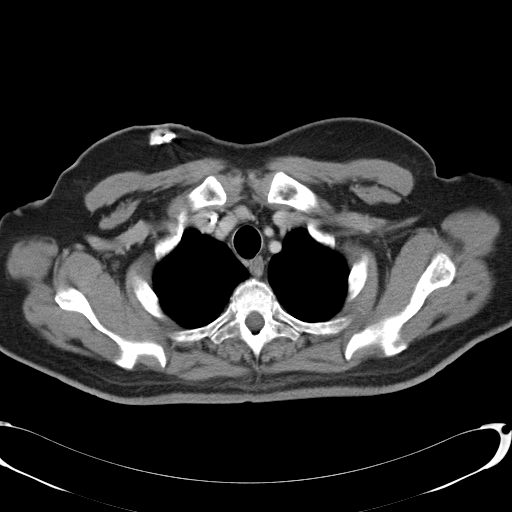

[Series 602: <mpr thick range> · coronal · 1.19mm/px · 3 of 97 slices shown]
[im 33/97  soft-tissue]
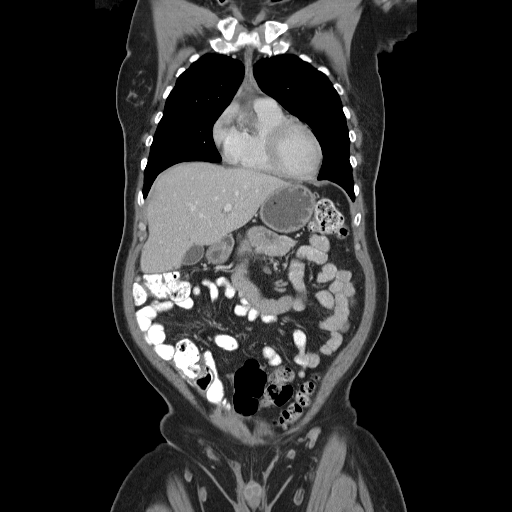
[im 43/97  soft-tissue]
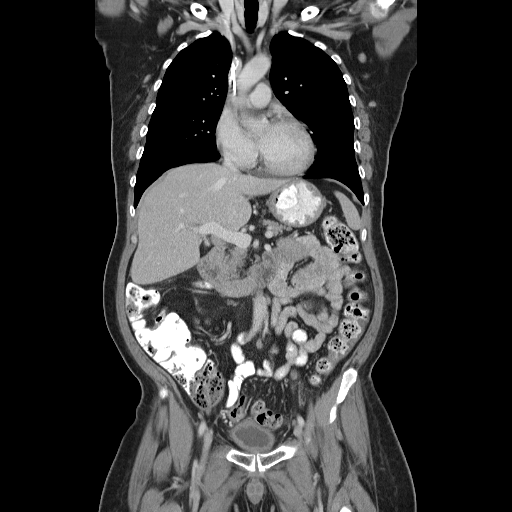
[im 54/97  soft-tissue]
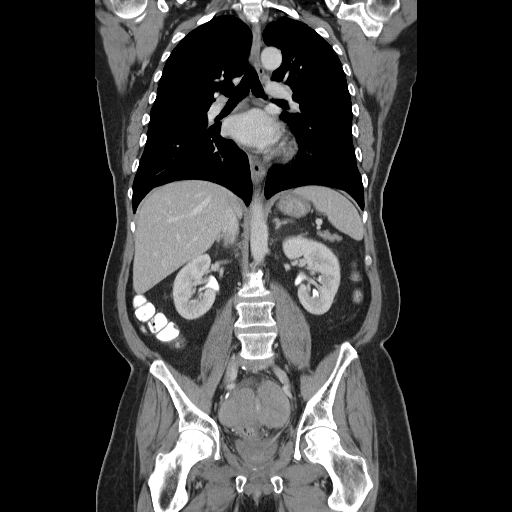

[14 of 46 positions shown; findings below may reference images not displayed]

FINDINGS: Mediastinum: Right internal jugular single lumen Port-A-Cath with
tip terminating in the distal superior vena cava. Heart size is
normal. Trace amount of pericardial fluid and/or thickening,
unlikely to be of hemodynamic significance at this time.  No
associated pericardial calcification. There is atherosclerosis of
the thoracic aorta, the great vessels of the mediastinum and the
coronary arteries, including calcified atherosclerotic plaque in
the left main and left anterior descending coronary arteries. No
pathologically enlarged mediastinal or hilar lymph nodes. Esophagus
is unremarkable in appearance.

Lungs/Pleura: No suspicious appearing pulmonary nodules or masses
are identified.  No acute consolidative airspace disease.  A trace
amount of right-sided pleural fluid dependently. No left pleural
effusion.

Musculoskeletal: There are no aggressive appearing lytic or blastic
lesions noted in the visualized portions of the skeleton.
IMPRESSION: 1.  No findings to suggest metastatic disease to the thorax.
2.  Atherosclerosis, including left main and LAD coronary artery
disease. Please note that although the presence of coronary artery
calcium documents the presence of coronary artery disease, the
severity of this disease and any potential stenosis cannot be
assessed on this non-gated CT examination.  Assessment for
potential risk factor modification, dietary therapy or
pharmacologic therapy may be warranted, if clinically indicated.

CT ABDOMEN AND PELVIS
FINDINGS: Abdomen/Pelvis: A small area of ill-defined low attenuation
adjacent to the falciform ligament is unchanged, most compatible
with focal fatty infiltration.  Several other sub centimeter low
attenuation well-defined lesions in the liver are similar to the
prior examination, and although too small to definitively
characterize, are statistically likely to represent tiny cysts. The
appearance of the gallbladder, pancreas, spleen, bilateral adrenal
glands and bilateral kidneys is unremarkable.  Mild atherosclerosis
throughout the abdominal and pelvic vasculature, without aneurysm
or dissection.

Compared to the prior examination the previously noted pelvic mass
in the presacral area has significantly increased in size and is
now difficult to measure given its multilobulated and infiltrative
appearance, but this lesion measures approximately 9.1 x 9.0 x
cm.  This lesion demonstrates heterogeneous internal enhancement
with some internal areas of decreased enhancement that may
represent a central regions of necrosis.  This lesion is intimately
associated with the distal rectum which is inferiorly displaced by
the mass.  There is loss of the normal intervening fat plane
between this lesion and the adjacent rectum.  There is no
significant volume of ascites. However, there is a trace volume of
presacral fluid which is presumably reactive.

No pneumoperitoneum.  No pathologic distension of small bowel.
There are numerous borderline enlarged and mildly enlarged pelvic
lymph nodes, largest of which measures only 1.1 cm in short axis
(and has a normal fatty hilum) on image 86 of series 2 in the right
common iliac chain.  Postoperative changes of radical prostatectomy
are noted.  Urinary bladder is nearly decompressed and demonstrates
some wall thickening (this is likely accentuated by under
distension of the bladder).

Musculoskeletal: There are no aggressive appearing lytic or blastic
lesions noted in the visualized portions of the skeleton.
IMPRESSION: 1.  Significant interval progression of disease with marked
enlargement of the infiltrative appearing pelvic mass, as discussed
above.  There is a small amount of presacral fluid which is
presumably reactive and numerous borderline enlarged and mildly
enlarged pelvic lymph nodes, largest of which is in the right
common iliac chain.
2.  Additional incidental findings, similar to prior study, as
above.

## 2013-01-17 ENCOUNTER — Telehealth: Payer: Self-pay | Admitting: *Deleted

## 2013-01-17 ENCOUNTER — Ambulatory Visit (HOSPITAL_BASED_OUTPATIENT_CLINIC_OR_DEPARTMENT_OTHER): Payer: BC Managed Care – PPO

## 2013-01-17 ENCOUNTER — Ambulatory Visit: Payer: BC Managed Care – PPO | Admitting: Oncology

## 2013-01-17 ENCOUNTER — Encounter: Payer: Self-pay | Admitting: Oncology

## 2013-01-17 ENCOUNTER — Telehealth: Payer: Self-pay | Admitting: Oncology

## 2013-01-17 ENCOUNTER — Other Ambulatory Visit: Payer: BC Managed Care – PPO | Admitting: Lab

## 2013-01-17 ENCOUNTER — Other Ambulatory Visit (HOSPITAL_BASED_OUTPATIENT_CLINIC_OR_DEPARTMENT_OTHER): Payer: BC Managed Care – PPO | Admitting: Lab

## 2013-01-17 ENCOUNTER — Ambulatory Visit (HOSPITAL_BASED_OUTPATIENT_CLINIC_OR_DEPARTMENT_OTHER): Payer: BC Managed Care – PPO | Admitting: Oncology

## 2013-01-17 ENCOUNTER — Encounter (HOSPITAL_COMMUNITY)
Admission: RE | Admit: 2013-01-17 | Discharge: 2013-01-17 | Disposition: A | Discharge status: Home / Self Care | Payer: BC Managed Care – PPO | Source: Ambulatory Visit | Attending: Oncology | Admitting: Oncology

## 2013-01-17 ENCOUNTER — Other Ambulatory Visit: Payer: Self-pay | Admitting: *Deleted

## 2013-01-17 ENCOUNTER — Ambulatory Visit: Payer: BC Managed Care – PPO | Admitting: Lab

## 2013-01-17 ENCOUNTER — Ambulatory Visit: Payer: BC Managed Care – PPO

## 2013-01-17 VITALS — BP 134/73 | HR 123 | Temp 99.1°F | Resp 18 | Wt 170.9 lb

## 2013-01-17 VITALS — BP 137/80 | HR 82 | Temp 98.1°F | Resp 20

## 2013-01-17 DIAGNOSIS — C61 Malignant neoplasm of prostate: Secondary | ICD-10-CM

## 2013-01-17 DIAGNOSIS — D509 Iron deficiency anemia, unspecified: Secondary | ICD-10-CM | POA: Insufficient documentation

## 2013-01-17 DIAGNOSIS — D649 Anemia, unspecified: Secondary | ICD-10-CM

## 2013-01-17 DIAGNOSIS — C7952 Secondary malignant neoplasm of bone marrow: Secondary | ICD-10-CM

## 2013-01-17 DIAGNOSIS — R52 Pain, unspecified: Secondary | ICD-10-CM

## 2013-01-17 DIAGNOSIS — C7951 Secondary malignant neoplasm of bone: Secondary | ICD-10-CM

## 2013-01-17 DIAGNOSIS — Z5111 Encounter for antineoplastic chemotherapy: Secondary | ICD-10-CM

## 2013-01-17 DIAGNOSIS — Z8546 Personal history of malignant neoplasm of prostate: Secondary | ICD-10-CM

## 2013-01-17 LAB — COMPREHENSIVE METABOLIC PANEL (CC13)
ALT: <6 U/L (ref 0–55)
BUN: 15.1 mg/dL (ref 7.0–26.0)
CO2: 27 mEq/L (ref 22–29)
Calcium: 10.5 mg/dL — ABNORMAL HIGH (ref 8.4–10.4)
Chloride: 100 mEq/L (ref 98–107)
Creatinine: 0.7 mg/dL (ref 0.7–1.3)
Total Bilirubin: 0.34 mg/dL (ref 0.20–1.20)

## 2013-01-17 LAB — CBC WITH DIFFERENTIAL/PLATELET
BASO%: 0.3 % (ref 0.0–2.0)
Eosinophils Absolute: 0.1 10*3/uL (ref 0.0–0.5)
HCT: 24.4 % — ABNORMAL LOW (ref 38.4–49.9)
LYMPH%: 12.8 % — ABNORMAL LOW (ref 14.0–49.0)
MCHC: 30.3 g/dL — ABNORMAL LOW (ref 32.0–36.0)
MCV: 76 fL — ABNORMAL LOW (ref 79.3–98.0)
MONO#: 0.5 10*3/uL (ref 0.1–0.9)
MONO%: 7.9 % (ref 0.0–14.0)
NEUT%: 76.6 % — ABNORMAL HIGH (ref 39.0–75.0)
Platelets: 382 10*3/uL (ref 140–400)
RBC: 3.21 10*6/uL — ABNORMAL LOW (ref 4.20–5.82)
WBC: 5.9 10*3/uL (ref 4.0–10.3)
nRBC: 0 % (ref 0–0)

## 2013-01-17 LAB — PSA: PSA: 678.2 ng/mL — ABNORMAL HIGH (ref <=4.00)

## 2013-01-17 MED ORDER — FENTANYL 25 MCG/HR TD PT72: 1.0000 | MEDICATED_PATCH | TRANSDERMAL | Status: DC

## 2013-01-17 MED ORDER — SODIUM CHLORIDE 0.9 % IV SOLN
20.0000 mg/m2 | Freq: Once | INTRAVENOUS | Status: AC
  Administered 2013-01-17: 40 mg via INTRAVENOUS
  Filled 2013-01-17: qty 4

## 2013-01-17 MED ORDER — ONDANSETRON 8 MG/50ML IVPB (CHCC)
8.0000 mg | Freq: Once | INTRAVENOUS | Status: AC
Start: 2013-01-17 — End: 2013-01-17
  Administered 2013-01-17: 8 mg via INTRAVENOUS

## 2013-01-17 MED ORDER — OXYCODONE-ACETAMINOPHEN 10-325 MG PO TABS: 1.0000 | ORAL_TABLET | Freq: Four times a day (QID) | ORAL | Status: DC | PRN

## 2013-01-17 MED ORDER — SODIUM CHLORIDE 0.9 % IJ SOLN
10.0000 mL | INTRAMUSCULAR | Status: DC | PRN
  Administered 2013-01-17: 10 mL
  Filled 2013-01-17: qty 10

## 2013-01-17 MED ORDER — HEPARIN SOD (PORK) LOCK FLUSH 100 UNIT/ML IV SOLN
500.0000 [IU] | Freq: Once | INTRAVENOUS | Status: AC | PRN
  Administered 2013-01-17: 500 [IU]
  Filled 2013-01-17: qty 5

## 2013-01-17 MED ORDER — DEXTROSE 5 % IV SOLN
20.0000 mg/m2 | Freq: Once | INTRAVENOUS | Status: DC
  Filled 2013-01-17: qty 4

## 2013-01-17 MED ORDER — ACETAMINOPHEN 325 MG PO TABS
650.0000 mg | ORAL_TABLET | Freq: Once | ORAL | Status: AC
  Administered 2013-01-17: 650 mg via ORAL

## 2013-01-17 MED ORDER — DEXAMETHASONE SODIUM PHOSPHATE 20 MG/5ML IJ SOLN
12.0000 mg | Freq: Once | INTRAMUSCULAR | Status: AC
  Administered 2013-01-17: 12 mg via INTRAVENOUS

## 2013-01-17 MED ORDER — DIPHENHYDRAMINE HCL 50 MG/ML IJ SOLN
25.0000 mg | Freq: Once | INTRAMUSCULAR | Status: AC
  Administered 2013-01-17: 25 mg via INTRAVENOUS

## 2013-01-17 MED ORDER — FAMOTIDINE IN NACL 20-0.9 MG/50ML-% IV SOLN
20.0000 mg | Freq: Once | INTRAVENOUS | Status: AC
  Administered 2013-01-17: 20 mg via INTRAVENOUS

## 2013-01-17 MED ORDER — SODIUM CHLORIDE 0.9 % IV SOLN
Freq: Once | INTRAVENOUS | Status: AC
  Administered 2013-01-17: 13:00:00 via INTRAVENOUS

## 2013-01-17 NOTE — Progress Notes (Signed)
Hematology and Oncology Follow Up Visit  Andrew Nelson [id number] Feb 01, 1951 62 y.o. 01/17/2013 1:11 PM  CC:  Andrew Purpura, MD  Andrew Hoes. [name], MD  Andrew Nelson, M.D.    Principle Diagnosis: This is a 62 year old gentleman with prostate cancer initially diagnosed in 2007.  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  Prior Therapy: 1. Status post laparoscopic prostatectomy and bilateral lymphadenectomy, postoperative PSA was 0.5. 2. He received salvage radiation therapy due to persistent elevation in his PSA. 3. Patient treated with Lupron and Casodex due to a rise in his PSA, subsequently treated with Casodex withdrawal. 4. 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 5. Patient treated with 10 cycles of Taxotere and prednisone.  Taxotere was reduced to 60 mg/sq m; last dose given was in October 2011.  However, PSA started to rise up to 250. 6. Zytiga 1000 mg daily beginning October 2011 through August 2013. 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 Zytiga was stopped due to a rising PSA up to 630.40 on 03/18/12. 7. Xtandi 160 mg daily started on 04/13/12. Stopped in January 2014. 8. Received XRT to his pelvis and T8 09/19/12 through 10/10/12.  Current therapy: He is on Jevtana 20 mg/m2. S/P 2 cycles.    Interim History:  [name] presents today for a followup visit . He is improving since his last visit. He is still having fatigue, but this has improved. His po intake has improved and weight is stable. Had not had any major changes in his urine flow at this time. No bleeding. He did report some constipation but still moves his bowels regularly. He has been taking his Fentanyl patch and using Oxycodone 3-4 times per day. His mobility is about the same. No chest pain,  shortness of breath, dyspnea on exertion. No abdominal pain, nausea, vomiting. No complaints of neuropathy. He had low grade temp but no fevers, chills or sweats.   Medications: I have reviewed the patient's current medications. Current Outpatient Prescriptions  Medication Sig Dispense Refill  . ACCU-CHEK AVIVA PLUS test strip USE AS DIRECTED  100 strip  3  . calcium-vitamin D 250-100 MG-UNIT per tablet Take 1 tablet by mouth daily.        Marland Kitchen doxazosin (CARDURA) 8 MG tablet TAKE 1 TABLET BY MOUTH EVERY DAY  90 tablet  3  . fentaNYL (DURAGESIC - DOSED MCG/HR) 25 MCG/HR Place 1 patch (25 mcg total) onto the skin every 3 (three) days.  10 patch  0  . fish oil-omega-3 fatty acids 1000 MG capsule Take 1 g by mouth daily.        . hydrochlorothiazide (HYDRODIURIL) 25 MG tablet TAKE 1 TABLET BY MOUTH ONCE DAILY  90 tablet  3  . insulin aspart protamine-insulin aspart (NOVOLOG 70/30) (70-30) 100 UNIT/ML injection Inject 20 Units into the skin daily.      Marland Kitchen leuprolide (LUPRON) 30 MG injection Inject 30 mg into the muscle every 4 (four) months.        . lidocaine-prilocaine (EMLA) cream APPLY TO PORTA-CATH SITE 30 MINS TO 2 HOURS PRIOR TO TREATMENT & COVER WITH PLASTIC WRAP  30 g  0  . lisinopril (PRINIVIL,ZESTRIL) 10 MG tablet TAKE 1 TABLET BY MOUTH ONCE DAILY  90 tablet  3  . loperamide (IMODIUM) 2 MG capsule       .  magnesium hydroxide (MILK OF MAGNESIA) 400 MG/5ML suspension Take 15 mLs by mouth daily as needed.      . metFORMIN (GLUCOPHAGE) 850 MG tablet Take 850 mg by mouth 2 (two) times daily with a meal.      . NOVOLOG MIX 70/30 (70-30) 100 UNIT/ML injection INJECT 20 UNITS EVERY DAY AS DIRECTED  20 mL  3  . ondansetron (ZOFRAN) 8 MG tablet Take 1 tablet (8 mg total) by mouth every 8 (eight) hours as needed for nausea.  20 tablet  0  . oxyCODONE-acetaminophen (PERCOCET) 10-325 MG per tablet Take 1-2 tablets by mouth every 6 (six) hours as needed for pain.  120 tablet  0  . senna (SENOKOT) 8.6 MG  TABS Take 1 tablet (8.6 mg total) by mouth daily.  30 each  6  . sucralfate (CARAFATE) 1 GM/10ML suspension Take 10 mLs (1 g total) by mouth 4 (four) times daily.  420 mL  2   No current facility-administered medications for this visit.   Facility-Administered Medications Ordered in Other Visits  Medication Dose Route Frequency Provider Last Rate Last Dose  . cabazitaxel (JEVTANA) 40 mg in sodium chloride 0.9 % 250 mL chemo infusion  20 mg/m2 (Order-Specific) Intravenous Once [name], MD      . Dexamethasone Sodium Phosphate (DECADRON) injection 12 mg  12 mg Intravenous Once [name], MD      . diphenhydrAMINE (BENADRYL) injection 25 mg  25 mg Intravenous Once [name], MD      . famotidine (PEPCID) IVPB 20 mg  20 mg Intravenous Once [name], MD   20 mg at 01/17/13 1306  . heparin lock flush 100 unit/mL  500 Units Intravenous Once [name], MD      . heparin lock flush 100 unit/mL  500 Units Intracatheter Once PRN [name], MD      . ondansetron (ZOFRAN) IVPB 8 mg  8 mg Intravenous Once [name], MD      . sodium chloride 0.9 % injection 10 mL  10 mL Intravenous PRN [name], MD      . sodium chloride 0.9 % injection 10 mL  10 mL Intracatheter PRN [name], MD        Allergies:  Allergies  Allergen Reactions  . Aprindine   . Bc Powder (Aspirin-Salicylamide-Caffeine)   . Penicillins     Past Medical History, Surgical history, Social history, and Family History were reviewed and updated.  Review of Systems: Constitutional:  Negative for fever, chills, night sweats, anorexia, weight loss, pain. Cardiovascular: no chest pain or dyspnea on exertion Respiratory: no cough, shortness of breath, or wheezing Neurological: negative Dermatological: negative ENT: negative Skin: Negative. Gastrointestinal: no abdominal pain, change in bowel habits, or black or bloody stools Genito-Urinary: no dysuria, trouble voiding, or  hematuria Hematological and Lymphatic: negative Breast: negative Musculoskeletal: negative Remaining ROS negative.  Physical Exam: Blood pressure 134/73, pulse 123, temperature 99.1 F (37.3 C), resp. rate 18, weight 170 lb 14.4 oz (77.52 kg). ECOG: 1 General appearance: alert Head: Normocephalic, without obvious abnormality, atraumatic Neck: no adenopathy, no carotid bruit, no JVD, supple, symmetrical, trachea midline and thyroid not enlarged, symmetric, no tenderness/mass/nodules Lymph nodes: Cervical, supraclavicular, and axillary nodes normal. Heart:regular rate and rhythm, S1, S2 normal, no murmur, click, rub or gallop Lung:chest clear, no wheezing, rales, normal symmetric air entry Abdomen: soft, non-tender, without masses or organomegaly EXT:no erythema, induration, or nodules   Lab  Results: Lab Results  Component Value Date   WBC 5.9 01/17/2013   HGB 7.4* 01/17/2013   HCT 24.4* 01/17/2013   MCV 76.0* 01/17/2013   PLT 382 01/17/2013     Chemistry      Component Value Date/Time   NA 138 01/17/2013 1102   NA 135 03/18/2012 1541   NA 141 08/13/2011 1129   K 3.6 01/17/2013 1102   K 3.0* 03/18/2012 1541   K 3.7 08/13/2011 1129   CL 100 01/17/2013 1102   CL 98 03/18/2012 1541   CL 103 08/13/2011 1129   CO2 27 01/17/2013 1102   CO2 26 03/18/2012 1541   CO2 28 08/13/2011 1129   BUN 15.1 01/17/2013 1102   BUN 10 03/18/2012 1541   BUN 15 08/13/2011 1129   CREATININE 0.7 01/17/2013 1102   CREATININE 0.62 03/18/2012 1541   CREATININE 0.5* 08/13/2011 1129      Component Value Date/Time   CALCIUM 10.5* 01/17/2013 1102   CALCIUM 9.5 03/18/2012 1541   CALCIUM 9.3 08/13/2011 1129   ALKPHOS 98 01/17/2013 1102   ALKPHOS 74 03/18/2012 1541   ALKPHOS 76 08/13/2011 1129   AST 19 01/17/2013 1102   AST 15 03/18/2012 1541   AST 12 08/13/2011 1129   ALT <6 Repeated and Verified 01/17/2013 1102   ALT 10 03/18/2012 1541   BILITOT 0.34 01/17/2013 1102   BILITOT 0.4 03/18/2012 1541   BILITOT 0.50 08/13/2011 1129     Results for  [place], Andrew Nelson (MRN [id number]) as of 01/17/2013 11:38  Ref. Range 10/26/2012 13:01 11/15/2012 08:59 12/06/2012 12:37 12/27/2012 08:47  PSA Latest Range: <=4.00 ng/mL 375.00 (H) 404.60 (H) 344.60 (H) 346.80 (H)    Impression and Plan:  This is a 62 year old gentleman with the following issues: 1. 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  Recently has been on  Xtandi, but stopped due to disease progression. CT scan from 08/2012 showed disease progression with a large pelvic mass. He is s/p XRT to this mass. He is now on Jevtana with a reduced dose (20 mg/m2).  Recommend that he proceed with cycle 3 today without further dose modification. We will hold off on Neulasta due to arthralgias.  2. Port-A-Cath management. It will be used for chemotherapy.   3. Avascular necrosis. Right hip surgery is on hold at this time.  4. Anemia that is multifactorial. I will set him up for 2 units of packed red blood cells later this week. 5. Androgen deprivation.  Receives Lupron every four months. Due July 2014. 6. Follow up: in 3 weeks for cycles 4. 7. Pain: I have refilled his fentanyl patch and oxycodone today.  [place], [place] 6/10/20141:11 PM

## 2013-01-17 NOTE — Addendum Note (Signed)
Addended by: Myrtis Ser on: 01/17/2013 02:10 PM   Modules accepted: Orders, SmartSet

## 2013-01-17 NOTE — Telephone Encounter (Signed)
Per staff message and POF I have scheduled appts.  JMW  

## 2013-01-17 NOTE — Patient Instructions (Addendum)
[facility] Discharge Instructions for Patients Receiving Chemotherapy  Today you received the following chemotherapy agents: Jevtana  To help prevent nausea and vomiting after your treatment, we encourage you to take your nausea medication.  Take it as often as prescribed.     If you develop nausea and vomiting that is not controlled by your nausea medication, call the clinic. If it is after clinic hours your family physician or the after hours number for the clinic or go to the Emergency Department.   BELOW ARE SYMPTOMS THAT SHOULD BE REPORTED IMMEDIATELY:  *FEVER GREATER THAN 100.5 F  *CHILLS WITH OR WITHOUT FEVER  NAUSEA AND VOMITING THAT IS NOT CONTROLLED WITH YOUR NAUSEA MEDICATION  *UNUSUAL SHORTNESS OF BREATH  *UNUSUAL BRUISING OR BLEEDING  TENDERNESS IN MOUTH AND THROAT WITH OR WITHOUT PRESENCE OF ULCERS  *URINARY PROBLEMS  *BOWEL PROBLEMS  UNUSUAL RASH Items with * indicate a potential emergency and should be followed up as soon as possible.  Feel free to call the clinic you have any questions or concerns. The clinic phone number is [phone].   I have been informed and understand all the instructions given to me. I know to contact the clinic, my physician, or go to the Emergency Department if any problems should occur. I do not have any questions at this time, but understand that I may call the clinic during office hours   should I have any questions or need assistance in obtaining follow up care.    __________________________________________  _____________  __________ Signature of Patient or Authorized Representative            Date                   Time    __________________________________________ Nurse's Signature     Blood Transfusion Information WHAT IS A BLOOD TRANSFUSION? A transfusion is the replacement of blood or some of its parts. Blood is made up of multiple cells which provide different functions.  Red blood cells carry  oxygen and are used for blood loss replacement.  White blood cells fight against infection.  Platelets control bleeding.  Plasma helps clot blood.  Other blood products are available for specialized needs, such as hemophilia or other clotting disorders. BEFORE THE TRANSFUSION  Who gives blood for transfusions?   You may be able to donate blood to be used at a later date on yourself (autologous donation).  Relatives can be asked to donate blood. This is generally not any safer than if you have received blood from a stranger. The same precautions are taken to ensure safety when a relative's blood is donated.  Healthy volunteers who are fully evaluated to make sure their blood is safe. This is blood bank blood. Transfusion therapy is the safest it has ever been in the practice of medicine. Before blood is taken from a donor, a complete history is taken to make sure that person has no history of diseases nor engages in risky social behavior (examples are intravenous drug use or sexual activity with multiple partners). The donor's travel history is screened to minimize risk of transmitting infections, such as malaria. The donated blood is tested for signs of infectious diseases, such as HIV and hepatitis. The blood is then tested to be sure it is compatible with you in order to minimize the chance of a transfusion reaction. If you or a relative donates blood, this is often done in anticipation of surgery and is not appropriate for  emergency situations. It takes many days to process the donated blood. RISKS AND COMPLICATIONS Although transfusion therapy is very safe and saves many lives, the main dangers of transfusion include:   Getting an infectious disease.  Developing a transfusion reaction. This is an allergic reaction to something in the blood you were given. Every precaution is taken to prevent this. The decision to have a blood transfusion has been considered carefully by your caregiver  before blood is given. Blood is not given unless the benefits outweigh the risks. AFTER THE TRANSFUSION  Right after receiving a blood transfusion, you will usually feel much better and more energetic. This is especially true if your red blood cells have gotten low (anemic). The transfusion raises the level of the red blood cells which carry oxygen, and this usually causes an energy increase.  The nurse administering the transfusion will monitor you carefully for complications. HOME CARE INSTRUCTIONS  No special instructions are needed after a transfusion. You may find your energy is better. Speak with your caregiver about any limitations on activity for underlying diseases you may have. SEEK MEDICAL CARE IF:   Your condition is not improving after your transfusion.  You develop redness or irritation at the intravenous (IV) site. SEEK IMMEDIATE MEDICAL CARE IF:  Any of the following symptoms occur over the next 12 hours:  Shaking chills.  You have a temperature by mouth above 102 F (38.9 C), not controlled by medicine.  Chest, back, or muscle pain.  People around you feel you are not acting correctly or are confused.  Shortness of breath or difficulty breathing.  Dizziness and fainting.  You get a rash or develop hives.  You have a decrease in urine output.  Your urine turns a dark color or changes to pink, red, or brown. Any of the following symptoms occur over the next 10 days:  You have a temperature by mouth above 102 F (38.9 C), not controlled by medicine.  Shortness of breath.  Weakness after normal activity.  The white part of the eye turns yellow (jaundice).  You have a decrease in the amount of urine or are urinating less often.  Your urine turns a dark color or changes to pink, red, or brown. Document Released: 07/24/2000 Document Revised: 10/19/2011 Document Reviewed: 03/12/2008 [facility] Patient Information 2014 [place], [place].

## 2013-01-17 NOTE — Telephone Encounter (Signed)
Sent pt to labs today for type and cross this week, emailed Marcelino Duster regadring spot @ chemo room

## 2013-01-18 LAB — TYPE AND SCREEN: Unit division: 0

## 2013-02-07 ENCOUNTER — Other Ambulatory Visit: Payer: BC Managed Care – PPO | Admitting: Lab

## 2013-02-07 ENCOUNTER — Ambulatory Visit: Payer: BC Managed Care – PPO

## 2013-02-07 ENCOUNTER — Ambulatory Visit: Payer: BC Managed Care – PPO | Admitting: Oncology

## 2013-02-07 ENCOUNTER — Telehealth: Payer: Self-pay | Admitting: *Deleted

## 2013-02-07 ENCOUNTER — Ambulatory Visit (HOSPITAL_BASED_OUTPATIENT_CLINIC_OR_DEPARTMENT_OTHER): Payer: BC Managed Care – PPO | Admitting: Oncology

## 2013-02-07 ENCOUNTER — Ambulatory Visit (HOSPITAL_BASED_OUTPATIENT_CLINIC_OR_DEPARTMENT_OTHER): Payer: BC Managed Care – PPO

## 2013-02-07 ENCOUNTER — Other Ambulatory Visit (HOSPITAL_BASED_OUTPATIENT_CLINIC_OR_DEPARTMENT_OTHER): Payer: BC Managed Care – PPO | Admitting: Lab

## 2013-02-07 ENCOUNTER — Telehealth: Payer: Self-pay | Admitting: Oncology

## 2013-02-07 VITALS — BP 110/68 | HR 123 | Temp 98.1°F | Resp 18 | Ht 67.5 in | Wt 174.9 lb

## 2013-02-07 VITALS — BP 116/65 | HR 123 | Temp 98.1°F | Resp 20

## 2013-02-07 DIAGNOSIS — C61 Malignant neoplasm of prostate: Secondary | ICD-10-CM

## 2013-02-07 DIAGNOSIS — C7951 Secondary malignant neoplasm of bone: Secondary | ICD-10-CM

## 2013-02-07 DIAGNOSIS — D649 Anemia, unspecified: Secondary | ICD-10-CM

## 2013-02-07 DIAGNOSIS — Z8546 Personal history of malignant neoplasm of prostate: Secondary | ICD-10-CM

## 2013-02-07 DIAGNOSIS — M25559 Pain in unspecified hip: Secondary | ICD-10-CM

## 2013-02-07 DIAGNOSIS — Z5111 Encounter for antineoplastic chemotherapy: Secondary | ICD-10-CM

## 2013-02-07 LAB — COMPREHENSIVE METABOLIC PANEL (CC13)
ALT: 9 U/L (ref 0–55)
AST: 20 U/L (ref 5–34)
Albumin: 2.9 g/dL — ABNORMAL LOW (ref 3.5–5.0)
Alkaline Phosphatase: 99 U/L (ref 40–150)
BUN: 13.6 mg/dL (ref 7.0–26.0)
CO2: 27 mEq/L (ref 22–29)
Calcium: 10.7 mg/dL — ABNORMAL HIGH (ref 8.4–10.4)
Chloride: 98 mEq/L (ref 98–109)
Creatinine: 0.7 mg/dL (ref 0.7–1.3)
Glucose: 106 mg/dl (ref 70–140)
Potassium: 3.9 mEq/L (ref 3.5–5.1)
Sodium: 134 mEq/L — ABNORMAL LOW (ref 136–145)
Total Bilirubin: 0.47 mg/dL (ref 0.20–1.20)
Total Protein: 7.1 g/dL (ref 6.4–8.3)

## 2013-02-07 LAB — CBC WITH DIFFERENTIAL/PLATELET
BASO%: 1.1 % (ref 0.0–2.0)
Basophils Absolute: 0.1 10*3/uL (ref 0.0–0.1)
EOS%: 0.7 % (ref 0.0–7.0)
Eosinophils Absolute: 0 10*3/uL (ref 0.0–0.5)
HCT: 23.9 % — ABNORMAL LOW (ref 38.4–49.9)
HGB: 8 g/dL — ABNORMAL LOW (ref 13.0–17.1)
LYMPH%: 9.8 % — ABNORMAL LOW (ref 14.0–49.0)
MCH: 24.7 pg — ABNORMAL LOW (ref 27.2–33.4)
MCHC: 33.5 g/dL (ref 32.0–36.0)
MCV: 73.6 fL — ABNORMAL LOW (ref 79.3–98.0)
MONO#: 0.6 10*3/uL (ref 0.1–0.9)
MONO%: 8.8 % (ref 0.0–14.0)
NEUT#: 5 10*3/uL (ref 1.5–6.5)
NEUT%: 79.6 % — ABNORMAL HIGH (ref 39.0–75.0)
Platelets: 219 10*3/uL (ref 140–400)
RBC: 3.25 10*6/uL — ABNORMAL LOW (ref 4.20–5.82)
RDW: 19 % — ABNORMAL HIGH (ref 11.0–14.6)
WBC: 6.3 10*3/uL (ref 4.0–10.3)
lymph#: 0.6 10*3/uL — ABNORMAL LOW (ref 0.9–3.3)

## 2013-02-07 LAB — PSA: PSA: 806.8 ng/mL — ABNORMAL HIGH (ref <=4.00)

## 2013-02-07 MED ORDER — SODIUM CHLORIDE 0.9 % IV SOLN
20.0000 mg/m2 | Freq: Once | INTRAVENOUS | Status: AC
  Administered 2013-02-07: 40 mg via INTRAVENOUS
  Filled 2013-02-07: qty 4

## 2013-02-07 MED ORDER — SODIUM CHLORIDE 0.9 % IJ SOLN
10.0000 mL | INTRAMUSCULAR | Status: DC | PRN
  Administered 2013-02-07: 10 mL via INTRAVENOUS
  Filled 2013-02-07: qty 10

## 2013-02-07 MED ORDER — ONDANSETRON 8 MG/50ML IVPB (CHCC)
8.0000 mg | Freq: Once | INTRAVENOUS | Status: AC
  Administered 2013-02-07: 8 mg via INTRAVENOUS

## 2013-02-07 MED ORDER — SODIUM CHLORIDE 0.9 % IJ SOLN
10.0000 mL | INTRAMUSCULAR | Status: DC | PRN
  Administered 2013-02-07: 10 mL
  Filled 2013-02-07: qty 10

## 2013-02-07 MED ORDER — DIPHENHYDRAMINE HCL 50 MG/ML IJ SOLN
25.0000 mg | Freq: Once | INTRAMUSCULAR | Status: AC
  Administered 2013-02-07: 25 mg via INTRAVENOUS

## 2013-02-07 MED ORDER — FAMOTIDINE IN NACL 20-0.9 MG/50ML-% IV SOLN
20.0000 mg | Freq: Once | INTRAVENOUS | Status: AC
  Administered 2013-02-07: 20 mg via INTRAVENOUS

## 2013-02-07 MED ORDER — HEPARIN SOD (PORK) LOCK FLUSH 100 UNIT/ML IV SOLN
500.0000 [IU] | Freq: Once | INTRAVENOUS | Status: AC | PRN
  Administered 2013-02-07: 500 [IU]
  Filled 2013-02-07: qty 5

## 2013-02-07 MED ORDER — SODIUM CHLORIDE 0.9 % IV SOLN
Freq: Once | INTRAVENOUS | Status: AC
  Administered 2013-02-07: 14:00:00 via INTRAVENOUS

## 2013-02-07 MED ORDER — DEXAMETHASONE SODIUM PHOSPHATE 20 MG/5ML IJ SOLN
12.0000 mg | Freq: Once | INTRAMUSCULAR | Status: AC
  Administered 2013-02-07: 12 mg via INTRAVENOUS

## 2013-02-07 MED ORDER — HEPARIN SOD (PORK) LOCK FLUSH 100 UNIT/ML IV SOLN
500.0000 [IU] | Freq: Once | INTRAVENOUS | Status: AC
  Administered 2013-02-07: 500 [IU] via INTRAVENOUS
  Filled 2013-02-07: qty 5

## 2013-02-07 NOTE — Patient Instructions (Addendum)
[facility] Center Discharge Instructions for Patients Receiving Chemotherapy  Today you received the following chemotherapy agent Jevtana  To help prevent nausea and vomiting after your treatment, we encourage you to take your nausea medication.   If you develop nausea and vomiting that is not controlled by your nausea medication, call the clinic.   BELOW ARE SYMPTOMS THAT SHOULD BE REPORTED IMMEDIATELY:  *FEVER GREATER THAN 100.5 F  *CHILLS WITH OR WITHOUT FEVER  NAUSEA AND VOMITING THAT IS NOT CONTROLLED WITH YOUR NAUSEA MEDICATION  *UNUSUAL SHORTNESS OF BREATH  *UNUSUAL BRUISING OR BLEEDING  TENDERNESS IN MOUTH AND THROAT WITH OR WITHOUT PRESENCE OF ULCERS  *URINARY PROBLEMS  *BOWEL PROBLEMS  UNUSUAL RASH Items with * indicate a potential emergency and should be followed up as soon as possible.  Feel free to call the clinic you have any questions or concerns. The clinic phone number is [phone].  Cabazitaxel injection What is this medicine? CABAZITAXEL (ka baz i TAX el) is a chemotherapy drug. It is used to treat prostate cancer. It targets fast dividing cells, like cancer cells, and causes these cells to die. This medicine may be used for other purposes; ask your health care provider or pharmacist if you have questions. What should I tell my health care provider before I take this medicine? They need to know if you have any of these conditions: -kidney disease -liver disease -low blood counts, like low white cell, platelet, or red cell counts -an unusual or allergic reaction to cabazitaxel, other medicines, foods, dyes, or preservatives -pregnant or trying to get pregnant -breast-feeding How should I use this medicine? This medicine is for infusion into a vein. It is given by a health care professional in a hospital or clinic setting. Talk to your pediatrician regarding the use of this medicine in children. Special care may be needed. Overdosage: If  you think you've taken too much of this medicine contact a poison control center or emergency room at once. Overdosage: If you think you have taken too much of this medicine contact a poison control center or emergency room at once. NOTE: This medicine is only for you. Do not share this medicine with others. What if I miss a dose? It is important not to miss your dose. Call your doctor or health care professional if you are unable to keep an appointment. What may interact with this medicine? -carbamazepine -certain antiviral medicines for HIV or AIDS -clarithromycin -medicines for fungal infections like ketoconazole, fluconazole, itraconazole, and voriconazole -nefazodone -phenobarbital -phenytoin -rifabutin -rifampicin -rifampin -St. John's Wort -telithromycin This list may not describe all possible interactions. Give your health care provider a list of all the medicines, herbs, non-prescription drugs, or dietary supplements you use. Also tell them if you smoke, drink alcohol, or use illegal drugs. Some items may interact with your medicine. What should I watch for while using this medicine? Your condition will be monitored carefully while you are receiving this medicine. This drug may make you feel generally unwell. This is not uncommon, as chemotherapy can affect healthy cells as well as cancer cells. Report any side effects. Continue your course of treatment even though you feel ill unless your doctor tells you to stop. Call your doctor or health care professional for advice if you get a fever, chills or sore throat, or other symptoms of a cold or flu. Do not treat yourself. This drug decreases your body's ability to fight infections. Try to avoid being around people who are sick.  This medicine may increase your risk to bruise or bleed. Call your doctor or health care professional if you notice any unusual bleeding. Be careful brushing and flossing your teeth or using a toothpick because  you may get an infection or bleed more easily. If you have any dental work done, tell your dentist you are receiving this medicine. Avoid taking products that contain aspirin, acetaminophen, ibuprofen, naproxen, or ketoprofen unless instructed by your doctor. These medicines may hide a fever. Do not become pregnant while taking this medicine. Women should inform their doctor if they wish to become pregnant or think they might be pregnant. There is a potential for serious side effects to an unborn child. Talk to your health care professional or pharmacist for more information. Do not breast-feed an infant while taking this medicine. What side effects may I notice from receiving this medicine? Side effects that you should report to your doctor or health care professional as soon as possible: -allergic reactions like skin rash, itching or hives, swelling of the face, lips, or tongue -low blood counts - this medicine may decrease the number of white blood cells, red blood cells and platelets. You may be at increased risk for infections and bleeding. -signs of infection - fever or chills, cough, sore throat, pain or difficulty passing urine -signs of decreased platelets or bleeding - bruising, pinpoint red spots on the skin, black, tarry stools, blood in the urine -signs of decreased red blood cells - unusually weak or tired, fainting spells, lightheadedness -breathing problems -pain, tingling, numbness in the hands or feet  Side effects that usually do not require medical attention (Report these to your doctor or health care professional if they continue or are bothersome.): -back pain -change in taste -constipation -hair loss -headache -loss of appetite -muscle or joint pain -nausea, vomiting -stomach pain This list may not describe all possible side effects. Call your doctor for medical advice about side effects. You may report side effects to FDA at 1-800-FDA-1088. Where should I keep my  medicine? This drug is given in a hospital or clinic and will not be stored at home. NOTE: This sheet is a summary. It may not cover all possible information. If you have questions about this medicine, talk to your doctor, pharmacist, or health care provider.  2013, [employer]. (12/15/2011 2:59:17 PM)

## 2013-02-07 NOTE — Telephone Encounter (Signed)
gv and printed appt sched and avs for pt...emailed MW to add tx... °

## 2013-02-07 NOTE — Progress Notes (Signed)
Hematology and Oncology Follow Up Visit  Andrew Nelson [id number] 01-06-1951 62 y.o. 02/07/2013 1:39 PM  CC:  Andrew Purpura, MD  Andrew Hoes. [name], MD  Andrew Nelson, M.D.    Principle Diagnosis: This is a 62 year old gentleman with prostate cancer initially diagnosed in 2007.  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  Prior Therapy: 1. Status post laparoscopic prostatectomy and bilateral lymphadenectomy, postoperative PSA was 0.5. 2. He received salvage radiation therapy due to persistent elevation in his PSA. 3. Patient treated with Lupron and Casodex due to a rise in his PSA, subsequently treated with Casodex withdrawal. 4. 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 5. Patient treated with 10 cycles of Taxotere and prednisone.  Taxotere was reduced to 60 mg/sq m; last dose given was in October 2011.  However, PSA started to rise up to 250. 6. Zytiga 1000 mg daily beginning October 2011 through August 2013. 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 Zytiga was stopped due to a rising PSA up to 630.40 on 03/18/12. 7. Xtandi 160 mg daily started on 04/13/12. Stopped in January 2014. 8. Received XRT to his pelvis and T8 09/19/12 through 10/10/12.  Current therapy: He is on Jevtana 20 mg/m2. S/P 3 cycles.   Interim History:  [name] presents today for a followup visit . He is improving since his last visit. He is still having fatigue, but this has improved. His po intake has improved and weight is stable. Had not had any major changes in his urine flow at this time. No bleeding. He did report some constipation but still moves his bowels regularly. He has been taking his Fentanyl patch and using Oxycodone 3-4 times per day. His mobility is about the same. No chest pain, shortness  of breath, dyspnea on exertion. No abdominal pain, nausea, vomiting. No complaints of neuropathy. He had low grade temp but no fevers, chills or sweats. He is reporting more pain however. He feels that is the patch is not working as well. He is using more breakthrough medications. His pain is more diffuse in nature mostly in the right shoulder as well as mid back. No neuro symptoms at this time.    Medications: I have reviewed the patient's current medications. Current Outpatient Prescriptions  Medication Sig Dispense Refill  . ACCU-CHEK AVIVA PLUS test strip USE AS DIRECTED  100 strip  3  . calcium-vitamin D 250-100 MG-UNIT per tablet Take 1 tablet by mouth daily.        Marland Kitchen doxazosin (CARDURA) 8 MG tablet TAKE 1 TABLET BY MOUTH EVERY DAY  90 tablet  3  . fentaNYL (DURAGESIC - DOSED MCG/HR) 25 MCG/HR Place 1 patch (25 mcg total) onto the skin every 3 (three) days.  10 patch  0  . fish oil-omega-3 fatty acids 1000 MG capsule Take 1 g by mouth daily.        . hydrochlorothiazide (HYDRODIURIL) 25 MG tablet TAKE 1 TABLET BY MOUTH ONCE DAILY  90 tablet  3  . insulin aspart protamine-insulin aspart (NOVOLOG 70/30) (70-30) 100 UNIT/ML injection Inject 20 Units into the skin daily.      Marland Kitchen leuprolide (LUPRON) 30 MG injection Inject 30 mg into the muscle every 4 (four) months.        . lidocaine-prilocaine (EMLA) cream APPLY TO PORTA-CATH SITE 30  MINS TO 2 HOURS PRIOR TO TREATMENT & COVER WITH PLASTIC WRAP  30 g  0  . lisinopril (PRINIVIL,ZESTRIL) 10 MG tablet TAKE 1 TABLET BY MOUTH ONCE DAILY  90 tablet  3  . loperamide (IMODIUM) 2 MG capsule       . magnesium hydroxide (MILK OF MAGNESIA) 400 MG/5ML suspension Take 15 mLs by mouth daily as needed.      . metFORMIN (GLUCOPHAGE) 850 MG tablet Take 850 mg by mouth 2 (two) times daily with a meal.      . NOVOLOG MIX 70/30 (70-30) 100 UNIT/ML injection INJECT 20 UNITS EVERY DAY AS DIRECTED  20 mL  3  . ondansetron (ZOFRAN) 8 MG tablet Take 1 tablet (8 mg total)  by mouth every 8 (eight) hours as needed for nausea.  20 tablet  0  . oxyCODONE-acetaminophen (PERCOCET) 10-325 MG per tablet Take 1-2 tablets by mouth every 6 (six) hours as needed for pain.  120 tablet  0  . senna (SENOKOT) 8.6 MG TABS Take 1 tablet (8.6 mg total) by mouth daily.  30 each  6  . sucralfate (CARAFATE) 1 GM/10ML suspension Take 10 mLs (1 g total) by mouth 4 (four) times daily.  420 mL  2   No current facility-administered medications for this visit.   Facility-Administered Medications Ordered in Other Visits  Medication Dose Route Frequency Provider Last Rate Last Dose  . heparin lock flush 100 unit/mL  500 Units Intravenous Once [name], MD      . sodium chloride 0.9 % injection 10 mL  10 mL Intravenous PRN [name], MD        Allergies:  Allergies  Allergen Reactions  . Aprindine   . Bc Powder (Aspirin-Salicylamide-Caffeine)   . Penicillins     Past Medical History, Surgical history, Social history, and Family History were reviewed and updated.  Review of Systems: Constitutional:  Negative for fever, chills, night sweats, anorexia, weight loss, pain. Cardiovascular: no chest pain or dyspnea on exertion Respiratory: no cough, shortness of breath, or wheezing Neurological: negative Dermatological: negative ENT: negative Skin: Negative. Gastrointestinal: no abdominal pain, change in bowel habits, or black or bloody stools Genito-Urinary: no dysuria, trouble voiding, or hematuria Hematological and Lymphatic: negative Breast: negative Musculoskeletal: negative Remaining ROS negative.  Physical Exam: Blood pressure 110/68, pulse 123, temperature 98.1 F (36.7 C), temperature source Oral, resp. rate 18, height 5' 7.5" (1.715 m), weight 174 lb 14.4 oz (79.334 kg). ECOG: 1 General appearance: alert Head: Normocephalic, without obvious abnormality, atraumatic Neck: no adenopathy, no carotid bruit, no JVD, supple, symmetrical, trachea midline and  thyroid not enlarged, symmetric, no tenderness/mass/nodules Lymph nodes: Cervical, supraclavicular, and axillary nodes normal. Heart:regular rate and rhythm, S1, S2 normal, no murmur, click, rub or gallop Lung:chest clear, no wheezing, rales, normal symmetric air entry Abdomen: soft, non-tender, without masses or organomegaly EXT:no erythema, induration, or nodules   Lab Results: Lab Results  Component Value Date   WBC 6.3 02/07/2013   HGB 8.0* 02/07/2013   HCT 23.9* 02/07/2013   MCV 73.6* 02/07/2013   PLT 219 02/07/2013     Chemistry      Component Value Date/Time   NA 138 01/17/2013 1102   NA 135 03/18/2012 1541   NA 141 08/13/2011 1129   K 3.6 01/17/2013 1102   K 3.0* 03/18/2012 1541   K 3.7 08/13/2011 1129   CL 100 01/17/2013 1102   CL 98 03/18/2012 1541   CL 103 08/13/2011 1129  CO2 27 01/17/2013 1102   CO2 26 03/18/2012 1541   CO2 28 08/13/2011 1129   BUN 15.1 01/17/2013 1102   BUN 10 03/18/2012 1541   BUN 15 08/13/2011 1129   CREATININE 0.7 01/17/2013 1102   CREATININE 0.62 03/18/2012 1541   CREATININE 0.5* 08/13/2011 1129      Component Value Date/Time   CALCIUM 10.5* 01/17/2013 1102   CALCIUM 9.5 03/18/2012 1541   CALCIUM 9.3 08/13/2011 1129   ALKPHOS 98 01/17/2013 1102   ALKPHOS 74 03/18/2012 1541   ALKPHOS 76 08/13/2011 1129   AST 19 01/17/2013 1102   AST 15 03/18/2012 1541   AST 12 08/13/2011 1129   ALT <6 Repeated and Verified 01/17/2013 1102   ALT 10 03/18/2012 1541   BILITOT 0.34 01/17/2013 1102   BILITOT 0.4 03/18/2012 1541   BILITOT 0.50 08/13/2011 1129      Results for Mcsorley, Rebekah L (MRN [id number]) as of 02/07/2013 12:49  Ref. Range 12/27/2012 08:47 01/17/2013 11:02  PSA Latest Range: <=4.00 ng/mL 346.80 (H) 678.20 (H)    Impression and Plan:  This is a 63 year old gentleman with the following issues: 1. 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  Recently has been on  Xtandi, but stopped due to disease progression. CT scan  from 08/2012 showed disease progression with a large pelvic mass. He is s/p XRT to this mass. He is now on Jevtana with a reduced dose (20 mg/m2).  Recommend that he proceed with cycle 4 today without further dose modification. We will hold off on Neulasta due to arthralgias. His last PSA did go up but likely due to him skipping a treatment.  2. Port-A-Cath management. It will be used for chemotherapy.   3. Avascular necrosis. Right hip surgery is on hold at this time.  4. Anemia that is multifactorial. We will defer transfusion for now.  5. Androgen deprivation.  Receives Lupron every four months. Due July 2014. 6. Follow up: in 3 weeks for cycles 5. 7. Pain: I have instructed him to increase his Fentanyl patch to two patches of 25 mcg/hr for a total of 50 mcg/hr. He has enough of these at home and will let us know before he runs out.   [facility] 7/1/20141:39 PM

## 2013-02-07 NOTE — Telephone Encounter (Signed)
Per staff phone call and POF I have schedueld appts.  JMW  

## 2013-02-07 NOTE — Progress Notes (Signed)
02/07/13 Patient to flush room for lab draw.  Port left accessed for today's treatment.

## 2013-02-13 ENCOUNTER — Other Ambulatory Visit: Payer: Self-pay | Admitting: *Deleted

## 2013-02-13 DIAGNOSIS — C61 Malignant neoplasm of prostate: Secondary | ICD-10-CM

## 2013-02-13 MED ORDER — FENTANYL 50 MCG/HR TD PT72: 1.0000 | MEDICATED_PATCH | TRANSDERMAL | Status: DC

## 2013-02-13 MED ORDER — OXYCODONE-ACETAMINOPHEN 10-325 MG PO TABS: 1.0000 | ORAL_TABLET | Freq: Four times a day (QID) | ORAL | Status: DC | PRN

## 2013-02-13 NOTE — Telephone Encounter (Signed)
Patient's wife, Randa Evens called requesting refills on pain meds.  Reports Duragesic dose has been changed and noted this in last office note.

## 2013-02-17 ENCOUNTER — Telehealth: Payer: Self-pay | Admitting: Family Medicine

## 2013-02-17 DIAGNOSIS — Z86010 Personal history of colonic polyps: Secondary | ICD-10-CM

## 2013-02-17 DIAGNOSIS — E109 Type 1 diabetes mellitus without complications: Secondary | ICD-10-CM

## 2013-02-17 DIAGNOSIS — D649 Anemia, unspecified: Secondary | ICD-10-CM

## 2013-02-17 DIAGNOSIS — I10 Essential (primary) hypertension: Secondary | ICD-10-CM

## 2013-02-17 NOTE — Telephone Encounter (Signed)
Pt will be having labs drawn and cancer center on 02-28-13 and wife is requesting an order sent to cancer center for cpx labs drawn also. Pt has a port. Pt is sch to see Dr [name] 8-19 for his physical.

## 2013-02-17 NOTE — Telephone Encounter (Signed)
Labs ordered.  Left message on machine for patient.   

## 2013-02-28 ENCOUNTER — Ambulatory Visit (HOSPITAL_BASED_OUTPATIENT_CLINIC_OR_DEPARTMENT_OTHER): Payer: BC Managed Care – PPO

## 2013-02-28 ENCOUNTER — Encounter (HOSPITAL_COMMUNITY)
Admission: RE | Admit: 2013-02-28 | Discharge: 2013-02-28 | Disposition: A | Discharge status: Home / Self Care | Payer: BC Managed Care – PPO | Source: Ambulatory Visit | Attending: Oncology | Admitting: Oncology

## 2013-02-28 ENCOUNTER — Other Ambulatory Visit (HOSPITAL_BASED_OUTPATIENT_CLINIC_OR_DEPARTMENT_OTHER): Payer: BC Managed Care – PPO | Admitting: Lab

## 2013-02-28 ENCOUNTER — Encounter: Payer: Self-pay | Admitting: Oncology

## 2013-02-28 ENCOUNTER — Ambulatory Visit (HOSPITAL_BASED_OUTPATIENT_CLINIC_OR_DEPARTMENT_OTHER): Payer: BC Managed Care – PPO | Admitting: Oncology

## 2013-02-28 VITALS — BP 120/66 | HR 127 | Temp 98.0°F | Resp 20 | Ht 67.5 in | Wt 171.1 lb

## 2013-02-28 DIAGNOSIS — Z860100 Personal history of colon polyps, unspecified: Secondary | ICD-10-CM

## 2013-02-28 DIAGNOSIS — C775 Secondary and unspecified malignant neoplasm of intrapelvic lymph nodes: Secondary | ICD-10-CM

## 2013-02-28 DIAGNOSIS — C61 Malignant neoplasm of prostate: Secondary | ICD-10-CM

## 2013-02-28 DIAGNOSIS — D649 Anemia, unspecified: Secondary | ICD-10-CM

## 2013-02-28 DIAGNOSIS — F17200 Nicotine dependence, unspecified, uncomplicated: Secondary | ICD-10-CM

## 2013-02-28 DIAGNOSIS — R5383 Other fatigue: Secondary | ICD-10-CM

## 2013-02-28 DIAGNOSIS — Z5111 Encounter for antineoplastic chemotherapy: Secondary | ICD-10-CM

## 2013-02-28 DIAGNOSIS — D509 Iron deficiency anemia, unspecified: Secondary | ICD-10-CM

## 2013-02-28 DIAGNOSIS — I10 Essential (primary) hypertension: Secondary | ICD-10-CM

## 2013-02-28 DIAGNOSIS — R599 Enlarged lymph nodes, unspecified: Secondary | ICD-10-CM

## 2013-02-28 DIAGNOSIS — E109 Type 1 diabetes mellitus without complications: Secondary | ICD-10-CM

## 2013-02-28 DIAGNOSIS — D126 Benign neoplasm of colon, unspecified: Secondary | ICD-10-CM

## 2013-02-28 DIAGNOSIS — R5381 Other malaise: Secondary | ICD-10-CM

## 2013-02-28 DIAGNOSIS — Z8546 Personal history of malignant neoplasm of prostate: Secondary | ICD-10-CM

## 2013-02-28 DIAGNOSIS — M87051 Idiopathic aseptic necrosis of right femur: Secondary | ICD-10-CM

## 2013-02-28 LAB — CBC WITH DIFFERENTIAL/PLATELET
Basophils Absolute: 0 10*3/uL (ref 0.0–0.1)
EOS%: 1.1 % (ref 0.0–7.0)
Eosinophils Absolute: 0.1 10*3/uL (ref 0.0–0.5)
HCT: 21.6 % — ABNORMAL LOW (ref 38.4–49.9)
HGB: 6.6 g/dL — CL (ref 13.0–17.1)
MCH: 22.5 pg — ABNORMAL LOW (ref 27.2–33.4)
MONO#: 0.4 10*3/uL (ref 0.1–0.9)
NEUT#: 3.4 10*3/uL (ref 1.5–6.5)
NEUT%: 75.1 % — ABNORMAL HIGH (ref 39.0–75.0)
RDW: 20.4 % — ABNORMAL HIGH (ref 11.0–14.6)
lymph#: 0.7 10*3/uL — ABNORMAL LOW (ref 0.9–3.3)

## 2013-02-28 LAB — COMPREHENSIVE METABOLIC PANEL (CC13)
Albumin: 3 g/dL — ABNORMAL LOW (ref 3.5–5.0)
BUN: 11.4 mg/dL (ref 7.0–26.0)
CO2: 26 mEq/L (ref 22–29)
Calcium: 10.5 mg/dL — ABNORMAL HIGH (ref 8.4–10.4)
Chloride: 100 mEq/L (ref 98–109)
Glucose: 122 mg/dl (ref 70–140)
Potassium: 3.7 mEq/L (ref 3.5–5.1)
Sodium: 138 mEq/L (ref 136–145)
Total Protein: 7 g/dL (ref 6.4–8.3)

## 2013-02-28 LAB — TECHNOLOGIST REVIEW

## 2013-02-28 MED ORDER — ONDANSETRON 8 MG/50ML IVPB (CHCC)
8.0000 mg | Freq: Once | INTRAVENOUS | Status: AC
  Administered 2013-02-28: 8 mg via INTRAVENOUS

## 2013-02-28 MED ORDER — OXYCODONE-ACETAMINOPHEN 10-325 MG PO TABS: 1.0000 | ORAL_TABLET | Freq: Four times a day (QID) | ORAL | Status: DC | PRN

## 2013-02-28 MED ORDER — DEXAMETHASONE SODIUM PHOSPHATE 20 MG/5ML IJ SOLN
12.0000 mg | Freq: Once | INTRAMUSCULAR | Status: AC
  Administered 2013-02-28: 12 mg via INTRAVENOUS

## 2013-02-28 MED ORDER — FAMOTIDINE IN NACL 20-0.9 MG/50ML-% IV SOLN
20.0000 mg | Freq: Once | INTRAVENOUS | Status: AC
  Administered 2013-02-28: 20 mg via INTRAVENOUS

## 2013-02-28 MED ORDER — SODIUM CHLORIDE 0.9 % IV SOLN
Freq: Once | INTRAVENOUS | Status: AC
  Administered 2013-02-28: 16:00:00 via INTRAVENOUS

## 2013-02-28 MED ORDER — SODIUM CHLORIDE 0.9 % IJ SOLN
10.0000 mL | INTRAMUSCULAR | Status: DC | PRN
  Administered 2013-02-28: 10 mL via INTRAVENOUS
  Filled 2013-02-28: qty 10

## 2013-02-28 MED ORDER — SODIUM CHLORIDE 0.9 % IJ SOLN
10.0000 mL | INTRAMUSCULAR | Status: DC | PRN
  Administered 2013-02-28: 10 mL
  Filled 2013-02-28: qty 10

## 2013-02-28 MED ORDER — LEUPROLIDE ACETATE (4 MONTH) 30 MG IM KIT
30.0000 mg | PACK | Freq: Once | INTRAMUSCULAR | Status: AC
  Administered 2013-02-28: 30 mg via INTRAMUSCULAR
  Filled 2013-02-28: qty 30

## 2013-02-28 MED ORDER — FENTANYL 50 MCG/HR TD PT72: 1.0000 | MEDICATED_PATCH | TRANSDERMAL | Status: DC

## 2013-02-28 MED ORDER — SODIUM CHLORIDE 0.9 % IV SOLN: Freq: Once | INTRAVENOUS | Status: DC

## 2013-02-28 MED ORDER — SODIUM CHLORIDE 0.9 % IV SOLN
50.0000 mg | Freq: Once | INTRAVENOUS | Status: DC
  Filled 2013-02-28: qty 2

## 2013-02-28 MED ORDER — DIPHENHYDRAMINE HCL 50 MG/ML IJ SOLN
25.0000 mg | Freq: Once | INTRAMUSCULAR | Status: AC
  Administered 2013-02-28: 25 mg via INTRAVENOUS

## 2013-02-28 MED ORDER — FAMOTIDINE IN NACL 20-0.9 MG/50ML-% IV SOLN: 20.0000 mg | Freq: Once | INTRAVENOUS | Status: DC

## 2013-02-28 MED ORDER — DEXTROSE 5 % IV SOLN
20.0000 mg/m2 | Freq: Once | INTRAVENOUS | Status: AC
  Administered 2013-02-28: 40 mg via INTRAVENOUS
  Filled 2013-02-28: qty 4

## 2013-02-28 MED ORDER — HEPARIN SOD (PORK) LOCK FLUSH 100 UNIT/ML IV SOLN
500.0000 [IU] | Freq: Once | INTRAVENOUS | Status: AC | PRN
  Administered 2013-02-28: 500 [IU]
  Filled 2013-02-28: qty 5

## 2013-02-28 NOTE — Patient Instructions (Signed)
Results for TEO, MOEDE (MRN [id number]) as of 02/28/2013 14:16  Ref. Range 11/15/2012 08:59 12/06/2012 12:37 12/27/2012 08:47 01/17/2013 11:02 02/07/2013 12:25  PSA Latest Range: <=4.00 ng/mL 404.60 (H) 344.60 (H) 346.80 (H) 678.20 (H) 806.80 (H)

## 2013-02-28 NOTE — Patient Instructions (Addendum)
[facility] Discharge Instructions for Patients Receiving Chemotherapy  Today you received the following chemotherapy agents :jevtana and Lupron  To help prevent nausea and vomiting after your treatment, we encourage you to take your nausea medication    If you develop nausea and vomiting that is not controlled by your nausea medication, call the clinic.   BELOW ARE SYMPTOMS THAT SHOULD BE REPORTED IMMEDIATELY:  *FEVER GREATER THAN 100.5 F  *CHILLS WITH OR WITHOUT FEVER  NAUSEA AND VOMITING THAT IS NOT CONTROLLED WITH YOUR NAUSEA MEDICATION  *UNUSUAL SHORTNESS OF BREATH  *UNUSUAL BRUISING OR BLEEDING  TENDERNESS IN MOUTH AND THROAT WITH OR WITHOUT PRESENCE OF ULCERS  *URINARY PROBLEMS  *BOWEL PROBLEMS  UNUSUAL RASH Items with * indicate a potential emergency and should be followed up as soon as possible.  Feel free to call the clinic you have any questions or concerns. The clinic phone number is [phone].

## 2013-02-28 NOTE — Progress Notes (Signed)
Pt remained accessed for treatment tomorrow at 12;15. Pt will receive blood 03/01/13

## 2013-02-28 NOTE — Progress Notes (Signed)
Hematology and Oncology Follow Up Visit  RENALD Nelson [id number] 27-Nov-1950 62 y.o. 02/28/2013 3:49 PM  CC:  Andrew Purpura, MD  Andrew Hoes. [name], MD  Andrew Nelson, M.D.    Principle Diagnosis: This is a 62 year old gentleman with prostate cancer initially diagnosed in 2007.  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  Prior Therapy: 1. Status post laparoscopic prostatectomy and bilateral lymphadenectomy, postoperative PSA was 0.5. 2. He received salvage radiation therapy due to persistent elevation in his PSA. 3. Patient treated with Lupron and Casodex due to a rise in his PSA, subsequently treated with Casodex withdrawal. 4. 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 5. Patient treated with 10 cycles of Taxotere and prednisone.  Taxotere was reduced to 60 mg/sq m; last dose given was in October 2011.  However, PSA started to rise up to 250. 6. Zytiga 1000 mg daily beginning October 2011 through August 2013. 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 Zytiga was stopped due to a rising PSA up to 630.40 on 03/18/12. 7. Xtandi 160 mg daily started on 04/13/12. Stopped in January 2014. 8. Received XRT to his pelvis and T8 09/19/12 through 10/10/12.  Current therapy: He is on Jevtana 20 mg/m2. S/P 4 cycles.   Interim History:  Andrew Nelson presents today for a followup visit . He is improving since his last visit. He is still having fatigue, but this has improved. His po intake has improved and weight is stable. Had not had any major changes in his urine flow at this time. No bleeding. He did report some constipation but still moves his bowels regularly. He has been taking his Fentanyl patch and using Oxycodone 3-4 times per day. Pain is somewhat better with an increase in the fentanyl  50 mcg. His mobility is about the same. No chest pain, shortness of breath, dyspnea on exertion. No abdominal pain, nausea, vomiting. No complaints of neuropathy. No neuro symptoms at this time. He is reporting bilateral lower extremity edema. He is trying to elevate his legs much as possible.   Medications: I have reviewed the patient's current medications. Current Outpatient Prescriptions  Medication Sig Dispense Refill  . ACCU-CHEK AVIVA PLUS test strip USE AS DIRECTED  100 strip  3  . calcium-vitamin D 250-100 MG-UNIT per tablet Take 1 tablet by mouth daily.        Marland Kitchen doxazosin (CARDURA) 8 MG tablet TAKE 1 TABLET BY MOUTH EVERY DAY  90 tablet  3  . fentaNYL (DURAGESIC - DOSED MCG/HR) 50 MCG/HR Place 1 patch (50 mcg total) onto the skin every 3 (three) days.  10 patch  0  . fish oil-omega-3 fatty acids 1000 MG capsule Take 1 g by mouth daily.        . hydrochlorothiazide (HYDRODIURIL) 25 MG tablet TAKE 1 TABLET BY MOUTH ONCE DAILY  90 tablet  3  . insulin aspart protamine-insulin aspart (NOVOLOG 70/30) (70-30) 100 UNIT/ML injection Inject 20 Units into the skin daily.      Marland Kitchen leuprolide (LUPRON) 30 MG injection Inject 30 mg into the muscle every 4 (four) months.        . lidocaine-prilocaine (EMLA) cream APPLY TO PORTA-CATH SITE 30 MINS TO 2 HOURS PRIOR TO TREATMENT & COVER WITH PLASTIC WRAP  30 g  0  . lisinopril (PRINIVIL,ZESTRIL) 10 MG  tablet TAKE 1 TABLET BY MOUTH ONCE DAILY  90 tablet  3  . loperamide (IMODIUM) 2 MG capsule       . magnesium hydroxide (MILK OF MAGNESIA) 400 MG/5ML suspension Take 15 mLs by mouth daily as needed.      . metFORMIN (GLUCOPHAGE) 850 MG tablet Take 850 mg by mouth 2 (two) times daily with a meal.      . NOVOLOG MIX 70/30 (70-30) 100 UNIT/ML injection INJECT 20 UNITS EVERY DAY AS DIRECTED  20 mL  3  . ondansetron (ZOFRAN) 8 MG tablet Take 1 tablet (8 mg total) by mouth every 8 (eight) hours as needed for nausea.  20 tablet  0  . oxyCODONE-acetaminophen (PERCOCET)  10-325 MG per tablet Take 1-2 tablets by mouth every 6 (six) hours as needed for pain.  120 tablet  0  . senna (SENOKOT) 8.6 MG TABS Take 1 tablet (8.6 mg total) by mouth daily.  30 each  6  . sucralfate (CARAFATE) 1 GM/10ML suspension Take 10 mLs (1 g total) by mouth 4 (four) times daily.  420 mL  2   No current facility-administered medications for this visit.   Facility-Administered Medications Ordered in Other Visits  Medication Dose Route Frequency Provider Last Rate Last Dose  . 0.9 %  sodium chloride infusion   Intravenous Once [name], MD      . cabazitaxel (JEVTANA) 40 mg in dextrose 5 % 250 mL chemo infusion  20 mg/m2 (Order-Specific) Intravenous Once [name], MD      . Dexamethasone Sodium Phosphate (DECADRON) injection 12 mg  12 mg Intravenous Once [name], MD      . diphenhydrAMINE (BENADRYL) injection 25 mg  25 mg Intravenous Once [name], MD      . famotidine (PEPCID) IVPB 20 mg  20 mg Intravenous Once [name], MD      . heparin lock flush 100 unit/mL  500 Units Intravenous Once [name], MD      . heparin lock flush 100 unit/mL  500 Units Intracatheter Once PRN [name], MD      . ondansetron (ZOFRAN) IVPB 8 mg  8 mg Intravenous Once [name], MD      . sodium chloride 0.9 % injection 10 mL  10 mL Intravenous PRN [name], MD      . sodium chloride 0.9 % injection 10 mL  10 mL Intracatheter PRN [name], MD        Allergies:  Allergies  Allergen Reactions  . Aprindine   . Bc Powder (Aspirin-Salicylamide-Caffeine)   . Penicillins     Past Medical History, Surgical history, Social history, and Family History were reviewed and updated.  Review of Systems: Constitutional:  Negative for fever, chills, night sweats, anorexia, weight loss, pain. Cardiovascular: no chest pain or dyspnea on exertion Respiratory: no cough, shortness of breath, or wheezing Neurological: negative Dermatological: negative ENT:  negative Skin: Negative. Gastrointestinal: no abdominal pain, change in bowel habits, or black or bloody stools Genito-Urinary: no dysuria, trouble voiding, or hematuria Hematological and Lymphatic: negative Breast: negative Musculoskeletal: negative Remaining ROS negative.  Physical Exam: Blood pressure 120/66, pulse 127, temperature 98 F (36.7 C), temperature source Oral, resp. rate 20, height 5' 7.5" (1.715 m), weight 171 lb 1.6 oz (77.61 kg). ECOG: 1 General appearance: alert Head: Normocephalic, without obvious abnormality, atraumatic Neck: no adenopathy, no carotid bruit, no JVD, supple, symmetrical, trachea midline and  thyroid not enlarged, symmetric, no tenderness/mass/nodules Lymph nodes: Cervical, supraclavicular, and axillary nodes normal. Heart:regular rate and rhythm, S1, S2 normal, no murmur, click, rub or gallop Lung:chest clear, no wheezing, rales, normal symmetric air entry Abdomen: soft, non-tender, without masses or organomegaly EXT:no erythema, induration, or nodules. 1+ edema bilaterally.   Lab Results: Lab Results  Component Value Date   WBC 4.6 02/28/2013   HGB 6.6 repeated & verified* 02/28/2013   HCT 21.6* 02/28/2013   MCV 73.7* 02/28/2013   PLT 254 02/28/2013     Chemistry      Component Value Date/Time   NA 138 02/28/2013 1216   NA 135 03/18/2012 1541   NA 141 08/13/2011 1129   K 3.7 02/28/2013 1216   K 3.0* 03/18/2012 1541   K 3.7 08/13/2011 1129   CL 100 01/17/2013 1102   CL 98 03/18/2012 1541   CL 103 08/13/2011 1129   CO2 26 02/28/2013 1216   CO2 26 03/18/2012 1541   CO2 28 08/13/2011 1129   BUN 11.4 02/28/2013 1216   BUN 10 03/18/2012 1541   BUN 15 08/13/2011 1129   CREATININE 0.7 02/28/2013 1216   CREATININE 0.62 03/18/2012 1541   CREATININE 0.5* 08/13/2011 1129      Component Value Date/Time   CALCIUM 10.5* 02/28/2013 1216   CALCIUM 9.5 03/18/2012 1541   CALCIUM 9.3 08/13/2011 1129   ALKPHOS 90 02/28/2013 1216   ALKPHOS 74 03/18/2012 1541   ALKPHOS 76 08/13/2011 1129    AST 23 02/28/2013 1216   AST 15 03/18/2012 1541   AST 12 08/13/2011 1129   ALT 7 02/28/2013 1216   ALT 10 03/18/2012 1541   BILITOT 0.49 02/28/2013 1216   BILITOT 0.4 03/18/2012 1541   BILITOT 0.50 08/13/2011 1129      Results for Hetz, Crystal L (MRN [id number]) as of 02/28/2013 16:26  Ref. Range 12/06/2012 12:37 12/27/2012 08:47 01/17/2013 11:02 02/07/2013 12:25  PSA Latest Range: <=4.00 ng/mL 344.60 (H) 346.80 (H) 678.20 (H) 806.80 (H)    Impression and Plan:  This is a 62 year old gentleman with the following issues: 1. 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  Recently has been on  Xtandi, but stopped due to disease progression. CT scan from 08/2012 showed disease progression with a large pelvic mass. He is s/p XRT to this mass. He is now on Jevtana with a reduced dose (20 mg/m2).  Recommend that he proceed with cycle 5 today without further dose modification. We will hold off on Neulasta due to arthralgias. PSA has gone up but is pending today.  2. Port-A-Cath management. It will be used for chemotherapy.   3. Avascular necrosis. Right hip surgery is on hold at this time.  4. Anemia that is multifactorial. Will transfuse 2 units of pack red blood cells tomorrow.  5. Androgen deprivation.  Receives Lupron every four months. Given today. 6. Follow up: in 3 weeks for cycles 6. 7. Pain: Continue fentanyl patch and oxycodone which I have refilled today. 8. Lower extremity edema: I have encouraged him to elevate his legs as much as possible. I have also instructed him to obtain compression stockings and to avoid sodium and his diet.   [place], [place] 7/22/20143:49 PM

## 2013-03-01 ENCOUNTER — Ambulatory Visit (HOSPITAL_BASED_OUTPATIENT_CLINIC_OR_DEPARTMENT_OTHER): Payer: BC Managed Care – PPO

## 2013-03-01 VITALS — BP 124/60 | HR 70 | Temp 97.6°F | Resp 20

## 2013-03-01 DIAGNOSIS — D649 Anemia, unspecified: Secondary | ICD-10-CM

## 2013-03-01 DIAGNOSIS — C61 Malignant neoplasm of prostate: Secondary | ICD-10-CM

## 2013-03-01 MED ORDER — ACETAMINOPHEN 325 MG PO TABS
650.0000 mg | ORAL_TABLET | Freq: Once | ORAL | Status: AC
  Administered 2013-03-01: 650 mg via ORAL

## 2013-03-01 MED ORDER — HEPARIN SOD (PORK) LOCK FLUSH 100 UNIT/ML IV SOLN
500.0000 [IU] | Freq: Every day | INTRAVENOUS | Status: AC | PRN
  Administered 2013-03-01: 500 [IU]
  Filled 2013-03-01: qty 5

## 2013-03-01 MED ORDER — SODIUM CHLORIDE 0.9 % IJ SOLN
10.0000 mL | INTRAMUSCULAR | Status: AC | PRN
  Administered 2013-03-01: 10 mL
  Filled 2013-03-01: qty 10

## 2013-03-01 MED ORDER — SODIUM CHLORIDE 0.9 % IV SOLN
250.0000 mL | Freq: Once | INTRAVENOUS | Status: AC
  Administered 2013-03-01: 250 mL via INTRAVENOUS

## 2013-03-01 MED ORDER — DIPHENHYDRAMINE HCL 25 MG PO CAPS
25.0000 mg | ORAL_CAPSULE | Freq: Once | ORAL | Status: AC
  Administered 2013-03-01: 25 mg via ORAL

## 2013-03-01 NOTE — Patient Instructions (Addendum)
Blood Transfusion  A blood transfusion replaces your blood or some of its parts. Blood is replaced when you have lost blood because of surgery, an accident, or for severe blood conditions like anemia. You can donate blood to be used on yourself if you have a planned surgery. If you lose blood during that surgery, your own blood can be given back to you. Any blood given to you is checked to make sure it matches your blood type. Your temperature, blood pressure, and heart rate (vital signs) will be checked often.  GET HELP RIGHT AWAY IF:   You feel sick to your stomach (nauseous) or throw up (vomit).  You have watery poop (diarrhea).  You have shortness of breath or trouble breathing.  You have blood in your pee (urine) or have dark colored pee.  You have chest pain or tightness.  Your eyes or skin turn yellow (jaundice).  You have a temperature by mouth above 102 F (38.9 C), not controlled by medicine.  You start to shake and have chills.  You develop a a red rash (hives) or feel itchy.  You develop lightheadedness or feel confused.  You develop back, joint, or muscle pain.  You do not feel hungry (lost appetite).  You feel tired, restless, or nervous.  You develop belly (abdominal) cramps. Document Released: 10/23/2008 Document Revised: 10/19/2011 Document Reviewed: 10/23/2008 [employer] Patient Information 2014 [employer].  

## 2013-03-02 LAB — TYPE AND SCREEN
Unit division: 0
Unit division: 0

## 2013-03-03 ENCOUNTER — Other Ambulatory Visit: Payer: Self-pay | Admitting: *Deleted

## 2013-03-03 MED ORDER — FENTANYL 25 MCG/HR TD PT72: 1.0000 | MEDICATED_PATCH | TRANSDERMAL | Status: DC

## 2013-03-03 NOTE — Telephone Encounter (Signed)
Wife called to say pharmacy would not fill script, d/t it was written for vernell Labombard jr, the son. Will print  Another script and leave at front for patient p/u. Wife notified.

## 2013-03-13 ENCOUNTER — Emergency Department (HOSPITAL_COMMUNITY): Payer: BC Managed Care – PPO

## 2013-03-13 ENCOUNTER — Other Ambulatory Visit: Payer: Self-pay

## 2013-03-13 ENCOUNTER — Inpatient Hospital Stay (HOSPITAL_COMMUNITY)
Admission: EM | Admit: 2013-03-13 | Discharge: 2013-03-16 | DRG: 836 | Disposition: A | Discharge status: Home / Self Care | Payer: BC Managed Care – PPO | Attending: Neurosurgery | Admitting: Neurosurgery

## 2013-03-13 ENCOUNTER — Telehealth: Payer: Self-pay | Admitting: *Deleted

## 2013-03-13 ENCOUNTER — Encounter (HOSPITAL_COMMUNITY): Payer: Self-pay | Admitting: Emergency Medicine

## 2013-03-13 DIAGNOSIS — Z79899 Other long term (current) drug therapy: Secondary | ICD-10-CM

## 2013-03-13 DIAGNOSIS — E119 Type 2 diabetes mellitus without complications: Secondary | ICD-10-CM | POA: Diagnosis present

## 2013-03-13 DIAGNOSIS — G992 Myelopathy in diseases classified elsewhere: Secondary | ICD-10-CM | POA: Diagnosis present

## 2013-03-13 DIAGNOSIS — C7949 Secondary malignant neoplasm of other parts of nervous system: Principal | ICD-10-CM | POA: Diagnosis present

## 2013-03-13 DIAGNOSIS — Z8546 Personal history of malignant neoplasm of prostate: Secondary | ICD-10-CM

## 2013-03-13 DIAGNOSIS — I2510 Atherosclerotic heart disease of native coronary artery without angina pectoris: Secondary | ICD-10-CM | POA: Diagnosis present

## 2013-03-13 DIAGNOSIS — Z794 Long term (current) use of insulin: Secondary | ICD-10-CM

## 2013-03-13 DIAGNOSIS — C7951 Secondary malignant neoplasm of bone: Secondary | ICD-10-CM | POA: Diagnosis present

## 2013-03-13 DIAGNOSIS — Z87891 Personal history of nicotine dependence: Secondary | ICD-10-CM

## 2013-03-13 DIAGNOSIS — I10 Essential (primary) hypertension: Secondary | ICD-10-CM | POA: Diagnosis present

## 2013-03-13 DIAGNOSIS — D509 Iron deficiency anemia, unspecified: Secondary | ICD-10-CM | POA: Diagnosis present

## 2013-03-13 DIAGNOSIS — Z8042 Family history of malignant neoplasm of prostate: Secondary | ICD-10-CM

## 2013-03-13 DIAGNOSIS — Z800 Family history of malignant neoplasm of digestive organs: Secondary | ICD-10-CM

## 2013-03-13 DIAGNOSIS — C61 Malignant neoplasm of prostate: Secondary | ICD-10-CM

## 2013-03-13 DIAGNOSIS — Z923 Personal history of irradiation: Secondary | ICD-10-CM

## 2013-03-13 LAB — COMPREHENSIVE METABOLIC PANEL
AST: 26 U/L (ref 0–37)
Albumin: 3 g/dL — ABNORMAL LOW (ref 3.5–5.2)
Calcium: 10.2 mg/dL (ref 8.4–10.5)
Chloride: 95 mEq/L — ABNORMAL LOW (ref 96–112)
Creatinine, Ser: 0.56 mg/dL (ref 0.50–1.35)

## 2013-03-13 LAB — CBC WITH DIFFERENTIAL/PLATELET
Eosinophils Absolute: 0 10*3/uL (ref 0.0–0.7)
Eosinophils Relative: 0 % (ref 0–5)
HCT: 25.1 % — ABNORMAL LOW (ref 39.0–52.0)
Hemoglobin: 8 g/dL — ABNORMAL LOW (ref 13.0–17.0)
Lymphs Abs: 0.5 10*3/uL — ABNORMAL LOW (ref 0.7–4.0)
MCH: 25.1 pg — ABNORMAL LOW (ref 26.0–34.0)
MCV: 78.7 fL (ref 78.0–100.0)
Monocytes Relative: 13 % — ABNORMAL HIGH (ref 3–12)
RBC: 3.19 MIL/uL — ABNORMAL LOW (ref 4.22–5.81)

## 2013-03-13 MED ORDER — HYDROMORPHONE HCL PF 1 MG/ML IJ SOLN
1.0000 mg | Freq: Once | INTRAMUSCULAR | Status: AC
  Administered 2013-03-13: 1 mg via INTRAVENOUS
  Filled 2013-03-13: qty 1

## 2013-03-13 MED ORDER — IOHEXOL 350 MG/ML SOLN
100.0000 mL | Freq: Once | INTRAVENOUS | Status: AC | PRN
  Administered 2013-03-13: 60 mL via INTRAVENOUS

## 2013-03-13 MED ORDER — POTASSIUM CHLORIDE CRYS ER 20 MEQ PO TBCR
40.0000 meq | EXTENDED_RELEASE_TABLET | Freq: Once | ORAL | Status: AC
  Administered 2013-03-13: 40 meq via ORAL
  Filled 2013-03-13: qty 2

## 2013-03-13 MED ORDER — GADOBENATE DIMEGLUMINE 529 MG/ML IV SOLN
16.0000 mL | Freq: Once | INTRAVENOUS | Status: AC | PRN
  Administered 2013-03-13: 16 mL via INTRAVENOUS

## 2013-03-13 MED ORDER — DEXAMETHASONE SODIUM PHOSPHATE 10 MG/ML IJ SOLN
10.0000 mg | Freq: Once | INTRAMUSCULAR | Status: AC
  Administered 2013-03-14: 10 mg via INTRAVENOUS
  Filled 2013-03-13 (×2): qty 1

## 2013-03-13 MED ORDER — SODIUM CHLORIDE 0.9 % IV BOLUS (SEPSIS)
500.0000 mL | Freq: Once | INTRAVENOUS | Status: AC
  Administered 2013-03-13: 500 mL via INTRAVENOUS

## 2013-03-13 MED ORDER — SODIUM CHLORIDE 0.9 % IV BOLUS (SEPSIS)
1000.0000 mL | Freq: Once | INTRAVENOUS | Status: AC
  Administered 2013-03-13: 1000 mL via INTRAVENOUS

## 2013-03-13 NOTE — ED Notes (Signed)
MRI states that they will not be available to perform MRI during outpatient hours today.

## 2013-03-13 NOTE — ED Notes (Signed)
Patient transported to MRI 

## 2013-03-13 NOTE — ED Notes (Addendum)
Patient with history of prostate cancer reports to ED for back pain beginning with radiation treatment in March. Patient was referred to ED by PCP to look into upper back pain radiating to hip and for new onset of numbness in legs this past Saturday, preventing him from bearing weight and walking without difficulty, and 3 episodes of hematuria at the end of urination on Saturday. Patient states that he has been experiencing right sided and generalized chest tightness. Denies SOB, blurred vision. Patient reports he last had chemotherapy two weeks ago.

## 2013-03-13 NOTE — ED Provider Notes (Signed)
Care assumed at the change of shift. Pt having continued pain, mostly in midback, radiating around to chest and hips. He has had radiation treatment to T7-T9 earlier this year for suspected metastatic disease and started back on Chemo afterwards. He has not had any known mets to lung or liver. Discussed CT findings with radiologist and added T-spine to previously ordered L-spine MRI.   Dg Chest 2 View  03/13/2013   *RADIOLOGY REPORT*  Clinical Data: Back pain, chest pain.  CHEST - 2 VIEW  Comparison: 02/23/2012  Findings: Right Port-A-Cath remains in place, unchanged.  Linear atelectasis in the lung bases.  Heart is borderline in size.  Small bilateral pleural effusions.  Vague opacity in the left mid lung.  Cannot exclude pulmonary nodule or pleural based nodule.  Consider further evaluation with chest CT.  IMPRESSION: Bibasilar atelectasis.  Small effusions.  Questionable pulmonary or pleural nodule in the left mid lung peripherally.  Recommend further evaluation with chest CT.   Original Report Authenticated By: Charlett Nose, M.D.   Ct Angio Chest Pe W/cm &/or Wo Cm  03/13/2013   *RADIOLOGY REPORT*  Clinical Data: Chest pain with tachycardia and shortness of breath. Question pulmonary embolism.  History of prostate cancer.  CT ANGIOGRAPHY CHEST  Technique:  Multidetector CT imaging of the chest using the standard protocol during bolus administration of intravenous contrast. Multiplanar reconstructed images including MIPs were obtained and reviewed to evaluate the vascular anatomy.  Contrast: 60mL OMNIPAQUE IOHEXOL 350 MG/ML SOLN  Comparison: Chest CT 08/08/2012.  Findings: The pulmonary arteries appear satisfactorily opacified with contrast.  There is no evidence of acute pulmonary embolism. There is stable relatively mild atherosclerosis of the aorta, great vessels and coronary arteries.  A right IJ Port-A-Cath extends to the superior aspect of the right atrium.  There has been interval development of small  bilateral pleural effusions as well as a small to moderate pericardial effusion. Small precarinal and subcarinal lymph nodes are unchanged.  There is linear atelectasis at both lung bases.  No lung mass or endobronchial lesion is identified.  However, there is a pleural- based mass involving the left chest wall anteriorly adjacent to the fourth rib.  There are multiple progressive lytic lesions throughout the spine and ribs consistent with metastatic disease. There are new superior endplate compression deformities at T5 and T6 which may be pathologic. There is suspicion of posterior epidural tumor on the left at T6-T7 (axial image 31 and sagittal image 77).  Images through the upper abdomen demonstrate multiple enlarging low density liver lesions highly suspicious for metastatic disease.  IMPRESSION:  1.  No evidence of acute pulmonary embolism. 2.  Strong concern of progressive widespread metastatic disease to the bones, pleura and liver.  There are possible pathologic fractures at T5 and T6 with suspicion of posterior epidural tumor on the left at T6-T7, potentially causing cord compression. Thoracic MRI suggested for further evaluation. 3.  New bilateral pleural effusions and small to moderate pericardial effusion.  Critical Value/emergent results were called by telephone at the time of interpretation on 03/13/2013 at 1830 hours to Dr. Bernette Mayers, who verbally acknowledged these results.   Original Report Authenticated By: Carey Bullocks, M.D.   Mr Thoracic Spine Wo Contrast  03/13/2013   *RADIOLOGY REPORT*  Clinical Data:  Prostate cancer, evaluate epidural tumor  MRI THORACIC SPINE WITHOUT CONTRAST LUMBAR SPINE WITHOUT AND WITH CONTRAST  Technique:  Multiplanar and multiecho pulse sequences of the thoracic and lumbar spine were obtained without and with intravenous  contrast.  Contrast: 16mL MULTIHANCE GADOBENATE DIMEGLUMINE 529 MG/ML IV SOLN  Comparison:   Prior CT performed earlier on the same date.  MRI  THORACIC SPINE  Findings: The vertebral bodies are normally aligned with preservation of the normal thoracic kyphosis. Bone marrow signal intensity is markedly abnormal with diffuse osseous metastatic disease seen throughout the thoracic spine, essentially involving all levels. Postradiation changes are seen within the T7, T8, and T9 vertebral bodies.  There is mild vertebral body height loss at the T6 level which appears chronic in nature.  Overall, there is approximately 20% of height loss at this level.  Otherwise, vertebral bodies are preserved.  At the T4-5 level, there is epidural tumor within the right lateral aspect of the thecal sac extending into the right T4-5 neural foramen. There is resultant moderate right neural foraminal stenosis without significant central canal stenosis.  Tumor is seen involving several right sided ribs.  Slightly inferiorly at the T5 level, there is abnormal convex contour of the T5 vertebral body with classic curtain deformity of the ventral epidural space, consistent with epidural tumor (series 8, image 21).  There is resultant moderate canal stenosis with slight displacement of the spinal cord dorsally at this level.  No cord signal changes are seen.  Epidural tumor at this level extends into the left T5 foramen and along the right lateral aspect of T5 anteriorly, and along the right posterior fifth rib posteriorly and laterally (series 8, image 21). There is tumor extension into the posterior elements at this level, involving the bilateral pedicles and T4-5 facet joints, right greater than left.  There is secondary moderate right foraminal stenosis at T5-6.  Prominent tumor present within the left posterior elements of T7 is visualized, with extension into the left posterolateral epidural space (series 8, image 26).  The spinal cord is displaced anteriorly and to the right with secondary severe central canal stenosis. There is no associated cord signal changes.  Small amount  of epidural tumor with cord deformity is seen inferiorly at the level of T10 (series 8, image 42).  No significant foraminal stenosis.  There is mild central canal narrowing.  There is moderate left neural foraminal stenosis of the T11-12 left neural foramen due to particular enlargement from osseous metastatic disease.  MRI LUMBAR SPINE  Findings: The vertebral bodies are normally aligned with preservation of the normal lumbar lordosis.  Diffusely abnormal marrow signal intensity with innumerable osseous metastasis are again seen throughout the lumbar spine as well.  At the level of L1, there is epidural tumor within the right lateral epidural space abutting the right aspect of the spinal cord and resulting in mild canal stenosis.  There is tumor extension into the right neural foramen at L1-2 with nerve compression. Metastatic enlargement of the bilateral pedicles of L1 also contributes foraminal narrowing.  Inferiorly at the level of L3 - 4, there is degenerative disc bulge with a superimposed right foraminal disc protrusion which results and moderate right foraminal stenosis.  At the level of L4, there is abnormal epidural tumor within the left ventral aspect of the thecal sac with extension into the left lateral recess and left neural foramen.  There is secondary severe listhesis of the left L4-5 neural foramen. There is moderate canal stenosis at this level.  At the level of L5, a large abnormal epidural soft tissue masses present within the left ventral thecal sac which measures 1.8 x 1.5 x 2.2 cm in this compresses the left lateral recess and  results in severe canal and left neural foraminal stenosis at L4-5.  The described epidural tumor enhances with post contrast images.  Innumerable osseous metastases are also seen within the sacrum and bilateral iliac wings.  Bilateral pleural effusions are present, right greater than left.  IMPRESSION: 1. 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  2. 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  3.  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  No cord signal changes.  4.  Epidural tumor within the left posterolateral epidural space at T6-T7 with secondary severe central canal stenosis and dorsal displacement of the spinal cord.  No cord signal changes.  5.  Soft tissue mass within the left ventral epidural space at the L5 with secondary severe canal and left neural foraminal stenosis at L4-5.  6. Epidural tumor within the right lateral epidural space at L1 with secondary mild canal and right neural foraminal stenosis.  7.  Bilateral pleural effusions, right greater than left.  Critical Value/emergent results were called by telephone at the time of interpretation on 03/13/2013 at 11:21 pm to Dr. Susy Frizzle, who verbally acknowledged these results.   Original Report Authenticated By: Rise Mu, M.D.   11:33 PM Reviewed MRI images and discussed with radiologist. Dr. Jordan Likes with Neurosurgery who will review images and call back with recommendations. Decadron ordered.   Charles B. Bernette Mayers, MD 03/14/13 Barry Brunner

## 2013-03-13 NOTE — Telephone Encounter (Addendum)
Received call from pt's wife, Randa Evens stating that pt was having some issues & also needed Rx refill.  Returned call @ 10:30 am & pt reports that his pain is # 8-9 sitting still.  He states his pain med is not working well.  He has a 50 mcg & a 25 mcg duragesic patch on that was placed 03/12/13 & is using percocet 2 every 3-4 hours although ordered every 6 hours.  He is unable to sit up for long b/c he has a lot of pressure on his back so he is laying down most of the time. He states that he has some numbness down his left leg & is having trouble getting up & down.  He also reports constipation but no incontinence of bowel or bladder.   He reports his pain is unbearable.  His wife states that he is pretty much not functional.  He also reports having some blood in his urine 3-4 different times on Sat. & states that it was pink toward the end of his stream. Will discuss with Dr Clelia Croft or Clenton Pare NP.  Discussed with Dondra Prader NP & encouraged pt to go to the ED for evaluation due to possibility of cord compression. Pt is reluctant & thinks he just needs pain med but after discussion states he will go to the ED.

## 2013-03-13 NOTE — ED Provider Notes (Signed)
CSN: [id number]     Arrival date & time 03/13/13  1240 History     First MD Initiated Contact with Patient 03/13/13 1306     Chief Complaint  Patient presents with  . Back Pain  . Chest Pain   (Consider location/radiation/quality/duration/timing/severity/associated sxs/prior Treatment) HPI Comments: 62 yo male with prostate CA with mets, anemia, smoking presents with chest pain and back pain.  Pt was sent over to ER for worsening lower back pain and numbness in left leg over the weekend.  No known mets to spine.  Pt has had gradually worsening chest pain the past week and his normal narcotics/ patch are not sufficient.  General fatigue.  Dr Clelia Croft oncology following.  Last chemo 2 wks ago.  Difficulty walking due to pain and numbness. Chest tightness intermittent, non radiating, no exertional/ diaphoresis.  Similar to previous.    Patient is a 62 y.o. male presenting with back pain and chest pain. The history is provided by the patient and a relative.  Back Pain Location:  Lumbar spine Associated symptoms: chest pain   Associated symptoms: no abdominal pain, no dysuria, no fever and no headaches   Chest Pain Associated symptoms: back pain, fatigue, nausea and shortness of breath   Associated symptoms: no abdominal pain, no fever, no headache and not vomiting     Past Medical History  Diagnosis Date  . Diabetes mellitus   . Hypertension   . Prostate ca 6/07    prostate ca dx  . Allergy   . Anemia   . CAD (coronary artery disease)   . Blood transfusion without reported diagnosis     May 2013  . History of radiation therapy 09/19/12-10/10/2012    T-7-T9,35Gy/4fx,Prescaral mass 35Gy/84fx  . History of radiation therapy 05/2006-06/2006    salvage prostate rad tx 68.4Gy   Past Surgical History  Procedure Laterality Date  . Prostatectomy  03/03/06    radical,gleason 4+5=9,Adenocarcinoma  . Colonoscopy w/ polypectomy  03/01/01    hyperplastic,no adenomatous change or malignancy  .  Portacath placement      right   Family History  Problem Relation Age of Onset  . Cancer Other     prostate  . Stroke Other   . Prostate cancer Father   . Colon cancer Paternal Grandfather    History  Substance Use Topics  . Smoking status: Former Smoker -- 0.25 packs/day for 25 years  . Smokeless tobacco: Never Used  . Alcohol Use: No    Review of Systems  Constitutional: Positive for fatigue. Negative for fever and chills.  HENT: Negative for neck pain and neck stiffness.   Eyes: Negative for visual disturbance.  Respiratory: Positive for shortness of breath.   Cardiovascular: Positive for chest pain. Negative for leg swelling.  Gastrointestinal: Positive for nausea. Negative for vomiting, abdominal pain and diarrhea.  Genitourinary: Positive for hematuria. Negative for dysuria and flank pain.  Musculoskeletal: Positive for back pain and gait problem.  Skin: Negative for rash.  Neurological: Positive for light-headedness. Negative for headaches.    Allergies  Aprindine; Bc powder; and Penicillins  Home Medications   Current Outpatient Rx  Name  Route  Sig  Dispense  Refill  . Calcium Citrate-Vitamin D (CITRACAL/VITAMIN D PO)   Oral   Take 1 tablet by mouth daily. 1200 mg calcium and 1000 mg vitamin d         . doxazosin (CARDURA) 8 MG tablet   Oral   Take 8 mg by mouth at  bedtime.         . fentaNYL (DURAGESIC - DOSED MCG/HR) 25 MCG/HR patch   Transdermal   Place 1 patch onto the skin every 3 (three) days. Pt put patch on Sunday. Pt takes a total of 75mg  dose         . fentaNYL (DURAGESIC - DOSED MCG/HR) 50 MCG/HR   Transdermal   Place 1 patch onto the skin every 3 (three) days. Pt put patch on Sunday. Pt takes a total of 75 mg dose         . hydrochlorothiazide (HYDRODIURIL) 25 MG tablet   Oral   Take 25 mg by mouth daily.         . insulin aspart protamine-insulin aspart (NOVOLOG 70/30) (70-30) 100 UNIT/ML injection   Subcutaneous   Inject 20  Units into the skin daily with supper.          Marland Kitchen leuprolide (LUPRON) 30 MG injection   Intramuscular   Inject 30 mg into the muscle every 4 (four) months. Pt had 2 weeks ago         . lidocaine-prilocaine (EMLA) cream   Topical   Apply 1 application topically daily as needed (APPLY TO PORTA-CATH SITE 30 MINS TO 2 HOURS PRIOR TO TREATMENT & COVER WITH PLASTIC WRAP).         Marland Kitchen lisinopril (PRINIVIL,ZESTRIL) 10 MG tablet   Oral   Take 10 mg by mouth daily.         . metFORMIN (GLUCOPHAGE) 850 MG tablet   Oral   Take 850 mg by mouth 2 (two) times daily with a meal.         . Omega-3 Fatty Acids (FISH OIL TRIPLE STRENGTH) 1400 MG CAPS   Oral   Take 1,400 mg by mouth daily.         Marland Kitchen oxyCODONE-acetaminophen (PERCOCET) 10-325 MG per tablet   Oral   Take 1-2 tablets by mouth every 6 (six) hours as needed for pain.         Marland Kitchen senna (SENOKOT) 8.6 MG TABS tablet   Oral   Take 1 tablet by mouth.         Marland Kitchen ACCU-CHEK AVIVA PLUS test strip      USE AS DIRECTED   100 strip   3    BP 159/90  Pulse 120  Temp(Src) 98.7 F (37.1 C) (Oral)  Resp 9  SpO2 97% Physical Exam  Nursing note and vitals reviewed. Constitutional: He is oriented to person, place, and time. He appears well-developed and well-nourished.  HENT:  Head: Normocephalic and atraumatic.  Dry mm  Eyes: Conjunctivae are normal. Right eye exhibits no discharge. Left eye exhibits no discharge.  Neck: Normal range of motion. Neck supple. No tracheal deviation present.  Cardiovascular: Regular rhythm.  Tachycardia present.   Pulmonary/Chest: Effort normal.  Abdominal: Soft. He exhibits no distension. There is no tenderness. There is no guarding.  Musculoskeletal: He exhibits tenderness (paraspinal lumbar). He exhibits no edema.  Neurological: He is alert and oriented to person, place, and time. He has normal strength. No cranial nerve deficit. GCS eye subscore is 4. GCS verbal subscore is 5. GCS motor  subscore is 6.  Sensation intact to palpation major n bilateral LE Perirectal sensation intact, good tone  Skin: Skin is warm. No rash noted.  Psychiatric: He has a normal mood and affect.    ED Course   Procedures (including critical care time)   EMERGENCY DEPARTMENT [place] CARDIAC  EXAM "Study: Limited Ultrasound of the heart and pericardium"  INDICATIONS:Tachycardia and Dyspnea Multiple views of the heart and pericardium are obtained with a multi-frequency probe.  PERFORMED ZO:XWRUEA  IMAGES ARCHIVED?: Yes  FINDINGS: Small effusion  LIMITATIONS:  none  VIEWS USED: Subcostal 4 chamber, Parasternal long axis and Parasternal short axis  INTERPRETATION: hyperdynamic heart, small pericardial effusion    Labs Reviewed  COMPREHENSIVE METABOLIC PANEL - Abnormal; Notable for the following:    Potassium 3.3 (*)    Chloride 95 (*)    Glucose, Bld 121 (*)    Albumin 3.0 (*)    All other components within normal limits  CBC WITH DIFFERENTIAL - Abnormal; Notable for the following:    RBC 3.19 (*)    Hemoglobin 8.0 (*)    HCT 25.1 (*)    MCH 25.1 (*)    RDW 21.0 (*)    Lymphs Abs 0.5 (*)    Monocytes Relative 13 (*)    All other components within normal limits  TROPONIN I   Dg Chest 2 View  03/13/2013   *RADIOLOGY REPORT*  Clinical Data: Back pain, chest pain.  CHEST - 2 VIEW  Comparison: 02/23/2012  Findings: Right Port-A-Cath remains in place, unchanged.  Linear atelectasis in the lung bases.  Heart is borderline in size.  Small bilateral pleural effusions.  Vague opacity in the left mid lung.  Cannot exclude pulmonary nodule or pleural based nodule.  Consider further evaluation with chest CT.  IMPRESSION: Bibasilar atelectasis.  Small effusions.  Questionable pulmonary or pleural nodule in the left mid lung peripherally.  Recommend further evaluation with chest CT.   Original Report Authenticated By: Charlett Nose, M.D.   No diagnosis found.  MDM  Chest pain similar to previous  -- seems to be pain control issue however with tachycardia and CA will give fluids then CT chest for PE.  Back pain and numbness - MRI ordered to rule out mets to spine/ cauda equina.  Neuro exam unremarkable in ED.    Date: 03/13/2013  Rate: 122  Rhythm: sinus tachycardia  QRS Axis: normal  Intervals: normal  ST/T Wave abnormalities: normal  Conduction Disutrbances:none  Narrative Interpretation:   Old EKG Reviewed: voltage decreased, tachycardia  With chest pain, tachy and borderline bp, bedside US to rule out pericardial effusion- very mild one visualized. Rechecked, pain meds and repeat bolus. Anemia at baseline.  Pt signed out to Dr. Bernette Mayers with plan to reassess and fup CT and MRI results and admit/ observe via hospitalist. Dr Clelia Croft oncology.  Enid Skeens, MD 03/13/13 [phone]

## 2013-03-13 NOTE — ED Notes (Signed)
Pt in MRI.

## 2013-03-14 ENCOUNTER — Inpatient Hospital Stay (HOSPITAL_COMMUNITY): Payer: BC Managed Care – PPO | Admitting: Certified Registered Nurse Anesthetist

## 2013-03-14 ENCOUNTER — Encounter (HOSPITAL_COMMUNITY): Payer: Self-pay | Admitting: Certified Registered Nurse Anesthetist

## 2013-03-14 ENCOUNTER — Encounter (HOSPITAL_COMMUNITY)
Admission: EM | Disposition: A | Discharge status: Home / Self Care | Payer: Self-pay | Source: Home / Self Care | Attending: Neurosurgery

## 2013-03-14 DIAGNOSIS — C7949 Secondary malignant neoplasm of other parts of nervous system: Secondary | ICD-10-CM | POA: Diagnosis present

## 2013-03-14 HISTORY — PX: LAMINECTOMY: SHX219

## 2013-03-14 LAB — GLUCOSE, CAPILLARY: Glucose-Capillary: 168 mg/dL — ABNORMAL HIGH (ref 70–99)

## 2013-03-14 LAB — POCT I-STAT 4, (NA,K, GLUC, HGB,HCT)
Glucose, Bld: 130 mg/dL — ABNORMAL HIGH (ref 70–99)
HCT: 25 % — ABNORMAL LOW (ref 39.0–52.0)
Potassium: 4.2 mEq/L (ref 3.5–5.1)
Sodium: 138 mEq/L (ref 135–145)

## 2013-03-14 SURGERY — LUMBAR LAMINECTOMY FOR TUMOR
Anesthesia: General | Site: Back | Wound class: Clean

## 2013-03-14 MED ORDER — OXYCODONE-ACETAMINOPHEN 5-325 MG PO TABS
1.0000 | ORAL_TABLET | ORAL | Status: DC | PRN
  Administered 2013-03-14 – 2013-03-16 (×4): 2 via ORAL
  Filled 2013-03-14: qty 2
  Filled 2013-03-14: qty 1
  Filled 2013-03-14: qty 2
  Filled 2013-03-14: qty 1
  Filled 2013-03-14: qty 2

## 2013-03-14 MED ORDER — ACETAMINOPHEN 650 MG RE SUPP: 650.0000 mg | RECTAL | Status: DC | PRN

## 2013-03-14 MED ORDER — ONDANSETRON HCL 4 MG/2ML IJ SOLN: 4.0000 mg | INTRAMUSCULAR | Status: DC | PRN

## 2013-03-14 MED ORDER — HYDROMORPHONE HCL PF 1 MG/ML IJ SOLN
1.0000 mg | Freq: Once | INTRAMUSCULAR | Status: AC
  Administered 2013-03-14: 1 mg via INTRAVENOUS
  Filled 2013-03-14: qty 1

## 2013-03-14 MED ORDER — BUPIVACAINE HCL (PF) 0.25 % IJ SOLN
INTRAMUSCULAR | Status: DC | PRN
  Administered 2013-03-14: 30 mL

## 2013-03-14 MED ORDER — ROCURONIUM BROMIDE 100 MG/10ML IV SOLN
INTRAVENOUS | Status: DC | PRN
  Administered 2013-03-14: 50 mg via INTRAVENOUS

## 2013-03-14 MED ORDER — ONDANSETRON HCL 4 MG/2ML IJ SOLN
INTRAMUSCULAR | Status: DC | PRN
  Administered 2013-03-14: 4 mg via INTRAVENOUS

## 2013-03-14 MED ORDER — HYDROCODONE-ACETAMINOPHEN 5-325 MG PO TABS
1.0000 | ORAL_TABLET | ORAL | Status: DC | PRN
  Administered 2013-03-15 – 2013-03-16 (×5): 2 via ORAL
  Filled 2013-03-14 (×5): qty 2

## 2013-03-14 MED ORDER — SENNA 8.6 MG PO TABS
1.0000 | ORAL_TABLET | Freq: Two times a day (BID) | ORAL | Status: DC
  Administered 2013-03-14 – 2013-03-16 (×5): 8.6 mg via ORAL
  Filled 2013-03-14 (×7): qty 1

## 2013-03-14 MED ORDER — LEUPROLIDE ACETATE (4 MONTH) 30 MG IM KIT: 30.0000 mg | PACK | INTRAMUSCULAR | Status: DC

## 2013-03-14 MED ORDER — HYDROMORPHONE HCL PF 1 MG/ML IJ SOLN: 0.2500 mg | INTRAMUSCULAR | Status: DC | PRN

## 2013-03-14 MED ORDER — SODIUM CHLORIDE 0.9 % IJ SOLN: 3.0000 mL | INTRAMUSCULAR | Status: DC | PRN

## 2013-03-14 MED ORDER — FENTANYL 25 MCG/HR TD PT72
75.0000 ug | MEDICATED_PATCH | TRANSDERMAL | Status: DC
  Administered 2013-03-15: 75 ug via TRANSDERMAL
  Filled 2013-03-14: qty 3

## 2013-03-14 MED ORDER — THROMBIN 20000 UNITS EX SOLR
CUTANEOUS | Status: DC | PRN
  Administered 2013-03-14: 04:00:00 via TOPICAL

## 2013-03-14 MED ORDER — LIDOCAINE-PRILOCAINE 2.5-2.5 % EX CREA: 1.0000 "application " | TOPICAL_CREAM | Freq: Every day | CUTANEOUS | Status: DC | PRN

## 2013-03-14 MED ORDER — LIDOCAINE HCL (CARDIAC) 20 MG/ML IV SOLN
INTRAVENOUS | Status: DC | PRN
  Administered 2013-03-14: 30 mg via INTRAVENOUS

## 2013-03-14 MED ORDER — FENTANYL 50 MCG/HR TD PT72: 50.0000 ug | MEDICATED_PATCH | TRANSDERMAL | Status: DC

## 2013-03-14 MED ORDER — SODIUM CHLORIDE 0.9 % IV SOLN
INTRAVENOUS | Status: DC | PRN
  Administered 2013-03-14 (×2): via INTRAVENOUS

## 2013-03-14 MED ORDER — MENTHOL 3 MG MT LOZG: 1.0000 | LOZENGE | OROMUCOSAL | Status: DC | PRN

## 2013-03-14 MED ORDER — ALUM & MAG HYDROXIDE-SIMETH 200-200-20 MG/5ML PO SUSP: 30.0000 mL | Freq: Four times a day (QID) | ORAL | Status: DC | PRN

## 2013-03-14 MED ORDER — BISACODYL 10 MG RE SUPP: 10.0000 mg | Freq: Every day | RECTAL | Status: DC | PRN

## 2013-03-14 MED ORDER — ACETAMINOPHEN 325 MG PO TABS: 650.0000 mg | ORAL_TABLET | ORAL | Status: DC | PRN

## 2013-03-14 MED ORDER — OXYCODONE HCL 5 MG PO TABS: 5.0000 mg | ORAL_TABLET | Freq: Once | ORAL | Status: DC | PRN

## 2013-03-14 MED ORDER — NEOSTIGMINE METHYLSULFATE 1 MG/ML IJ SOLN
INTRAMUSCULAR | Status: DC | PRN
  Administered 2013-03-14: 3 mg via INTRAVENOUS

## 2013-03-14 MED ORDER — GLYCOPYRROLATE 0.2 MG/ML IJ SOLN
INTRAMUSCULAR | Status: DC | PRN
  Administered 2013-03-14: .5 mg via INTRAVENOUS

## 2013-03-14 MED ORDER — PROPOFOL 10 MG/ML IV BOLUS
INTRAVENOUS | Status: DC | PRN
  Administered 2013-03-14: 30 mg via INTRAVENOUS
  Administered 2013-03-14: 140 mg via INTRAVENOUS

## 2013-03-14 MED ORDER — LACTATED RINGERS IV SOLN
INTRAVENOUS | Status: DC | PRN
  Administered 2013-03-14: 03:00:00 via INTRAVENOUS

## 2013-03-14 MED ORDER — METFORMIN HCL 850 MG PO TABS
850.0000 mg | ORAL_TABLET | Freq: Two times a day (BID) | ORAL | Status: DC
  Administered 2013-03-14 – 2013-03-16 (×5): 850 mg via ORAL
  Filled 2013-03-14 (×8): qty 1

## 2013-03-14 MED ORDER — SODIUM CHLORIDE 0.9 % IR SOLN
Status: DC | PRN
  Administered 2013-03-14: 04:00:00

## 2013-03-14 MED ORDER — HYDROCHLOROTHIAZIDE 25 MG PO TABS
25.0000 mg | ORAL_TABLET | Freq: Every day | ORAL | Status: DC
  Administered 2013-03-14 – 2013-03-16 (×3): 25 mg via ORAL
  Filled 2013-03-14 (×3): qty 1

## 2013-03-14 MED ORDER — VANCOMYCIN HCL IN DEXTROSE 1-5 GM/200ML-% IV SOLN
INTRAVENOUS | Status: AC
  Filled 2013-03-14: qty 200

## 2013-03-14 MED ORDER — OXYCODONE HCL 5 MG/5ML PO SOLN: 5.0000 mg | Freq: Once | ORAL | Status: DC | PRN

## 2013-03-14 MED ORDER — PROMETHAZINE HCL 25 MG/ML IJ SOLN: 6.2500 mg | INTRAMUSCULAR | Status: DC | PRN

## 2013-03-14 MED ORDER — ARTIFICIAL TEARS OP OINT
TOPICAL_OINTMENT | OPHTHALMIC | Status: DC | PRN
  Administered 2013-03-14: 1 via OPHTHALMIC

## 2013-03-14 MED ORDER — INSULIN ASPART 100 UNIT/ML ~~LOC~~ SOLN
0.0000 [IU] | Freq: Three times a day (TID) | SUBCUTANEOUS | Status: DC
  Administered 2013-03-14: 3 [IU] via SUBCUTANEOUS
  Administered 2013-03-14 (×2): 4 [IU] via SUBCUTANEOUS
  Administered 2013-03-15: 3 [IU] via SUBCUTANEOUS
  Administered 2013-03-15: 4 [IU] via SUBCUTANEOUS
  Administered 2013-03-15: 3 [IU] via SUBCUTANEOUS
  Administered 2013-03-16: 4 [IU] via SUBCUTANEOUS

## 2013-03-14 MED ORDER — FENTANYL CITRATE 0.05 MG/ML IJ SOLN
INTRAMUSCULAR | Status: DC | PRN
  Administered 2013-03-14 (×3): 100 ug via INTRAVENOUS
  Administered 2013-03-14: 250 ug via INTRAVENOUS
  Administered 2013-03-14 (×4): 50 ug via INTRAVENOUS

## 2013-03-14 MED ORDER — HYDROMORPHONE HCL PF 1 MG/ML IJ SOLN: 0.5000 mg | INTRAMUSCULAR | Status: DC | PRN

## 2013-03-14 MED ORDER — INSULIN ASPART PROT & ASPART (70-30 MIX) 100 UNIT/ML ~~LOC~~ SUSP
20.0000 [IU] | Freq: Every day | SUBCUTANEOUS | Status: DC
  Administered 2013-03-14 – 2013-03-15 (×2): 20 [IU] via SUBCUTANEOUS
  Filled 2013-03-14: qty 10

## 2013-03-14 MED ORDER — FLEET ENEMA 7-19 GM/118ML RE ENEM: 1.0000 | ENEMA | Freq: Once | RECTAL | Status: AC | PRN

## 2013-03-14 MED ORDER — MIDAZOLAM HCL 5 MG/5ML IJ SOLN
INTRAMUSCULAR | Status: DC | PRN
  Administered 2013-03-14: 1 mg via INTRAVENOUS
  Administered 2013-03-14: 2 mg via INTRAVENOUS
  Administered 2013-03-14: 1 mg via INTRAVENOUS

## 2013-03-14 MED ORDER — SODIUM CHLORIDE 0.9 % IJ SOLN
3.0000 mL | Freq: Two times a day (BID) | INTRAMUSCULAR | Status: DC
  Administered 2013-03-15 (×2): 3 mL via INTRAVENOUS

## 2013-03-14 MED ORDER — VANCOMYCIN HCL 1000 MG IV SOLR
1000.0000 mg | INTRAVENOUS | Status: DC | PRN
  Administered 2013-03-14: 1000 mg via INTRAVENOUS

## 2013-03-14 MED ORDER — VANCOMYCIN HCL IN DEXTROSE 1-5 GM/200ML-% IV SOLN
1000.0000 mg | Freq: Once | INTRAVENOUS | Status: AC
  Administered 2013-03-14: 1000 mg via INTRAVENOUS
  Filled 2013-03-14: qty 200

## 2013-03-14 MED ORDER — DEXAMETHASONE SODIUM PHOSPHATE 4 MG/ML IJ SOLN
4.0000 mg | Freq: Four times a day (QID) | INTRAMUSCULAR | Status: DC
  Filled 2013-03-14 (×13): qty 1

## 2013-03-14 MED ORDER — MEPERIDINE HCL 25 MG/ML IJ SOLN: 6.2500 mg | INTRAMUSCULAR | Status: DC | PRN

## 2013-03-14 MED ORDER — DEXAMETHASONE SODIUM PHOSPHATE 4 MG/ML IJ SOLN
INTRAMUSCULAR | Status: DC | PRN
  Administered 2013-03-14 (×2): 4 mg via INTRAVENOUS

## 2013-03-14 MED ORDER — DEXAMETHASONE 4 MG PO TABS
4.0000 mg | ORAL_TABLET | Freq: Four times a day (QID) | ORAL | Status: DC
  Administered 2013-03-14 – 2013-03-16 (×10): 4 mg via ORAL
  Filled 2013-03-14 (×13): qty 1

## 2013-03-14 MED ORDER — POLYETHYLENE GLYCOL 3350 17 G PO PACK
17.0000 g | PACK | Freq: Every day | ORAL | Status: DC | PRN
  Administered 2013-03-14 – 2013-03-15 (×2): 17 g via ORAL
  Filled 2013-03-14 (×2): qty 1

## 2013-03-14 MED ORDER — 0.9 % SODIUM CHLORIDE (POUR BTL) OPTIME
TOPICAL | Status: DC | PRN
  Administered 2013-03-14: 1000 mL

## 2013-03-14 MED ORDER — DOXAZOSIN MESYLATE 8 MG PO TABS
8.0000 mg | ORAL_TABLET | Freq: Every day | ORAL | Status: DC
  Administered 2013-03-14 – 2013-03-15 (×2): 8 mg via ORAL
  Filled 2013-03-14 (×3): qty 1

## 2013-03-14 MED ORDER — LISINOPRIL 10 MG PO TABS
10.0000 mg | ORAL_TABLET | Freq: Every day | ORAL | Status: DC
  Administered 2013-03-14 – 2013-03-16 (×3): 10 mg via ORAL
  Filled 2013-03-14 (×3): qty 1

## 2013-03-14 MED ORDER — SODIUM CHLORIDE 0.9 % IV SOLN
250.0000 mL | INTRAVENOUS | Status: DC
Start: 2013-03-14 — End: 2013-03-16

## 2013-03-14 MED ORDER — PHENOL 1.4 % MT LIQD: 1.0000 | OROMUCOSAL | Status: DC | PRN

## 2013-03-14 MED ORDER — MIDAZOLAM HCL 2 MG/2ML IJ SOLN: 0.5000 mg | Freq: Once | INTRAMUSCULAR | Status: DC | PRN

## 2013-03-14 MED ORDER — CYCLOBENZAPRINE HCL 10 MG PO TABS
10.0000 mg | ORAL_TABLET | Freq: Three times a day (TID) | ORAL | Status: DC | PRN
  Administered 2013-03-15 (×3): 10 mg via ORAL
  Filled 2013-03-14 (×3): qty 1

## 2013-03-14 SURGICAL SUPPLY — 67 items
ADH SKN CLS APL DERMABOND .7 (GAUZE/BANDAGES/DRESSINGS) ×1
APL SKNCLS STERI-STRIP NONHPOA (GAUZE/BANDAGES/DRESSINGS) ×1
BAG DECANTER FOR FLEXI CONT (MISCELLANEOUS) ×2 IMPLANT
BENZOIN TINCTURE PRP APPL 2/3 (GAUZE/BANDAGES/DRESSINGS) ×2 IMPLANT
BLADE SURG 11 STRL SS (BLADE) IMPLANT
BLADE SURG ROTATE 9660 (MISCELLANEOUS) IMPLANT
BRUSH SCRUB EZ PLAIN DRY (MISCELLANEOUS) ×2 IMPLANT
BUR CUTTER 7.0 ROUND (BURR) IMPLANT
BUR MATCHSTICK NEURO 3.0 LAGG (BURR) IMPLANT
CANISTER SUCTION 2500CC (MISCELLANEOUS) ×2 IMPLANT
CATH COUDE FOLEY 2W 5CC 16FR (CATHETERS) IMPLANT
CLOTH BEACON ORANGE TIMEOUT ST (SAFETY) ×2 IMPLANT
CONT SPEC 4OZ CLIKSEAL STRL BL (MISCELLANEOUS) ×2 IMPLANT
DERMABOND ADVANCED (GAUZE/BANDAGES/DRESSINGS) ×1
DERMABOND ADVANCED .7 DNX12 (GAUZE/BANDAGES/DRESSINGS) ×1 IMPLANT
DRAPE LAPAROTOMY 100X72X124 (DRAPES) ×2 IMPLANT
DRAPE LAPAROTOMY T 102X78X121 (DRAPES) IMPLANT
DRAPE MICROSCOPE ZEISS OPMI (DRAPES) ×2 IMPLANT
DRAPE POUCH INSTRU U-SHP 10X18 (DRAPES) ×2 IMPLANT
DRAPE SURG 17X23 STRL (DRAPES) ×4 IMPLANT
DURAPREP 26ML APPLICATOR (WOUND CARE) ×2 IMPLANT
ELECT REM PT RETURN 9FT ADLT (ELECTROSURGICAL) ×2
ELECTRODE REM PT RTRN 9FT ADLT (ELECTROSURGICAL) ×1 IMPLANT
GAUZE SPONGE 4X4 16PLY XRAY LF (GAUZE/BANDAGES/DRESSINGS) ×2 IMPLANT
GLOVE ECLIPSE 8.5 STRL (GLOVE) ×2 IMPLANT
GLOVE EXAM NITRILE LRG STRL (GLOVE) IMPLANT
GLOVE EXAM NITRILE MD LF STRL (GLOVE) IMPLANT
GLOVE EXAM NITRILE XL STR (GLOVE) IMPLANT
GLOVE EXAM NITRILE XS STR PU (GLOVE) IMPLANT
GLOVE SS BIOGEL STRL SZ 6.5 (GLOVE) ×1 IMPLANT
GLOVE SUPERSENSE BIOGEL SZ 6.5 (GLOVE) ×1
GOWN BRE IMP SLV AUR LG STRL (GOWN DISPOSABLE) IMPLANT
GOWN BRE IMP SLV AUR XL STRL (GOWN DISPOSABLE) IMPLANT
GOWN STRL REIN 2XL LVL4 (GOWN DISPOSABLE) IMPLANT
HEMOSTAT SURGICEL 2X14 (HEMOSTASIS) IMPLANT
KIT BASIN OR (CUSTOM PROCEDURE TRAY) ×2 IMPLANT
KIT ROOM TURNOVER OR (KITS) ×2 IMPLANT
NEEDLE HYPO 22GX1.5 SAFETY (NEEDLE) ×2 IMPLANT
NEEDLE HYPO 25X1 1.5 SAFETY (NEEDLE) ×2 IMPLANT
NEEDLE SPNL 20GX3.5 QUINCKE YW (NEEDLE) IMPLANT
NS IRRIG 1000ML POUR BTL (IV SOLUTION) ×2 IMPLANT
PACK LAMINECTOMY NEURO (CUSTOM PROCEDURE TRAY) ×2 IMPLANT
PAD EYE OVAL STERILE LF (GAUZE/BANDAGES/DRESSINGS) IMPLANT
PATTIES SURGICAL .5 X.5 (GAUZE/BANDAGES/DRESSINGS) IMPLANT
PATTIES SURGICAL .5 X3 (DISPOSABLE) ×2 IMPLANT
RUBBERBAND STERILE (MISCELLANEOUS) ×4 IMPLANT
SPONGE GAUZE 4X4 12PLY (GAUZE/BANDAGES/DRESSINGS) ×2 IMPLANT
SPONGE LAP 4X18 X RAY DECT (DISPOSABLE) IMPLANT
SPONGE SURGIFOAM ABS GEL 100 (HEMOSTASIS) IMPLANT
STAPLER VISISTAT 35W (STAPLE) ×2 IMPLANT
STRIP CLOSURE SKIN 1/2X4 (GAUZE/BANDAGES/DRESSINGS) ×2 IMPLANT
SUT BONE WAX W31G (SUTURE) IMPLANT
SUT ETHILON 4 0 PS 2 18 (SUTURE) ×2 IMPLANT
SUT NURALON 4 0 TR CR/8 (SUTURE) IMPLANT
SUT PROLENE 6 0 BV (SUTURE) IMPLANT
SUT VIC AB 0 CT1 18XCR BRD8 (SUTURE) IMPLANT
SUT VIC AB 0 CT1 8-18 (SUTURE)
SUT VIC AB 2-0 CT1 18 (SUTURE) ×4 IMPLANT
SUT VIC AB 3-0 SH 8-18 (SUTURE) ×4 IMPLANT
SUT VICRYL 4-0 PS2 18IN ABS (SUTURE) IMPLANT
SYR 20ML ECCENTRIC (SYRINGE) IMPLANT
TAPE CLOTH SURG 4X10 WHT LF (GAUZE/BANDAGES/DRESSINGS) ×2 IMPLANT
TOWEL OR 17X24 6PK STRL BLUE (TOWEL DISPOSABLE) ×2 IMPLANT
TOWEL OR 17X26 10 PK STRL BLUE (TOWEL DISPOSABLE) ×2 IMPLANT
TRAY FOLEY CATH 16FRSI W/METER (SET/KITS/TRAYS/PACK) IMPLANT
TUBE CONNECTING 12X1/4 (SUCTIONS) IMPLANT
WATER STERILE IRR 1000ML POUR (IV SOLUTION) ×2 IMPLANT

## 2013-03-14 NOTE — Brief Op Note (Signed)
03/13/2013 - 03/14/2013  5:38 AM  PATIENT:  Andrew Nelson  62 y.o. male  PRE-OPERATIVE DIAGNOSIS:  Thoracic and Lumbar Laminectomy for Tumor  POST-OPERATIVE DIAGNOSIS:  Thoracic and Lumbar Laminectomy for Tumor  PROCEDURE:  Procedure(s): Thoracic six-seven laminectomy, resection of tumor. Lumbar five laminectomy and resection of tumor (N/A)  SURGEON:  Surgeon(s) and Role:    * Temple Pacini, MD - Primary  PHYSICIAN ASSISTANT:   ASSISTANTS:    ANESTHESIA:   general  EBL:  Total I/O In: 2258 [I.V.:1000; Blood:1258] Out: 800 [Urine:450; Blood:350]  BLOOD ADMINISTERED:2 units CC PRBC  DRAINS: (Medium) Hemovact drain(s) in the Epidural space with  Suction Open   LOCAL MEDICATIONS USED:  MARCAINE     SPECIMEN:  Source of Specimen:  Lumbar and thoracic epidural space  DISPOSITION OF SPECIMEN:  PATHOLOGY  COUNTS:  YES  TOURNIQUET:  * No tourniquets in log *  DICTATION: .Dragon Dictation  PLAN OF CARE: Admit to inpatient   PATIENT DISPOSITION:  PACU - hemodynamically stable.   Delay start of Pharmacological VTE agent (>24hrs) due to surgical blood loss or risk of bleeding: yes

## 2013-03-14 NOTE — Anesthesia Postprocedure Evaluation (Signed)
  Anesthesia Post-op Note  Patient: Andrew Nelson  Procedure(s) Performed: Procedure(s): Thoracic six-seven laminectomy, resection of tumor. Lumbar five laminectomy and resection of tumor (N/A)  Patient Location: PACU  Anesthesia Type:General  Level of Consciousness: awake, alert  and oriented  Airway and Oxygen Therapy: Patient Spontanous Breathing and Patient connected to nasal cannula oxygen  Post-op Pain: none  Post-op Assessment: Post-op Vital signs reviewed, Patient's Cardiovascular Status Stable, Respiratory Function Stable, Patent Airway, No signs of Nausea or vomiting and Pain level controlled  Post-op Vital Signs: Reviewed and stable  Complications: No apparent anesthesia complications

## 2013-03-14 NOTE — Progress Notes (Signed)
UR COMPLETED  

## 2013-03-14 NOTE — Op Note (Signed)
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  Metastatic prostate carcinoma to L5 with epidural extension and resultant radiculopathy  Postoperative diagnosis: Same  Procedure Name: T6-T7 laminectomy and resection of epidural tumor. Microdissection.  Left L5 decompressive hemilaminectomy with resection of epidural tumor. Microdissection.  Surgeon:Kree Rafter A.Varetta Chavers, M.D.  Asst. Surgeon: None  Anesthesia: General  Indication: 62 year old male with history of metastatic prostate carcinoma presents with worsening back pain symptoms or weakness and sensory loss.Workup demonstrates evidence of multiple areas of epidural tumor extension including T4 on the right which does not cause significant spinal cord compression T6-7 dorsolaterally on the left which causes marked spinal cord compression T12 on the right with minimal thecal sac compression and L5 on the left with marked L5 nerve root compression. Patient presents now for laminectomy and resection of his epidural tumor at both T6-7 and L5.    Operative note:After induction of anesthesia, patient positioned prone onto Wilson frame and appropriately padded. Thoracic and lumbar region prepped and draped sterilely. Incision made over the T6-7 level. This is carried down sharply in the midline. Supper off dissection performed bilaterally using electrocautery. Retractor placed. The bony anatomy was confirmatory of the tumor. Decompressive laminectomies then performed using Leksell rongeurs Kerrison rongeurs the high-speed drill to remove the entire lamina of T6 the superior three quarters the lamina of T7 and the intervening ligamentum flavum. A large amount of dorsolateral epidural tumor was encountered. This dissected free using microdissection within the spinal canal by means of the operative  microscope. The tumor itself was dissected free and specimen was obtained for later pathologic analysis. All elements of the epidural tumor were removed. The edges of the laminectomy were waxed. The wound is irrigated. Gelfoam placed topically. Medium Hemovac drain was left at per space. Wounds and close in layers with Vicryl sutures. Attention placed to the L5 level. Incision was made overlying the L5-S1 interspace. Supper off Henderson Newcomer performed the left side so the lamina facet joints of L5 and S1. Deep self retaining traction placed intraoperative x-ray was not felt necessary given the bony anatomy and the obvious tumor. Decompressive laminectomy on the left side was performed using high-speed drill Kerrison rongeurs to remove the entire lamina of L5 on the left medial aspect the L5-S1 facet joint and the ligamentum flavum between L5 and S1. Underlying thecal sac and exiting L5 and S1 nerve roots were notified. Thecal sac was gently mobilized. Epidural tumor which was more ventral lateral at this level was dissected free using blunt nerve hooks and micropituitary's. All elements the upper term were resected. This went very thorough decompression of the L5 and S1 nerve roots been achieved. Wounds an area that like solution. Gelfoam was placed topically for hemostasis. Wounds and close in layers with Vicryl sutures. Steri-Strips and sterile dressing were applied. There were no apparent complications. Patient tolerated the procedure well and he returns to the recovery room postop.

## 2013-03-14 NOTE — Progress Notes (Signed)
PT Cancellation Note  Patient Details Name: ZAYDYN HAVEY MRN: [id number] DOB: [birth year]   Cancelled Treatment:    Reason Eval/Treat Not Completed: Patient not medically ready . Orders for OOB and ambulate POD #1. PT to eval as able 03/15/2013.   Alisabeth Selkirk, Becky Sax 03/14/2013, 10:36 AM

## 2013-03-14 NOTE — OR Nursing (Signed)
03:45 Attempted to place 16FR foley catheter but was not able to pass catheter completely without much resistance. Patient has history of prostate cancer and has had prostate surgery.Attempted a 16 fr coude and was able to get 150cc urine drained but not completely able to advance catheter far enough to inflate balloon.Informed surgeon of failure to insert foley and surgeon opted not to have foley for patient.DDay RN

## 2013-03-14 NOTE — Transfer of Care (Signed)
Immediate Anesthesia Transfer of Care Note  Patient: Andrew Nelson  Procedure(s) Performed: Procedure(s): Thoracic six-seven laminectomy, resection of tumor. Lumbar five laminectomy and resection of tumor (N/A)  Patient Location: PACU  Anesthesia Type:General  Level of Consciousness: awake, oriented and patient cooperative  Airway & Oxygen Therapy: Patient Spontanous Breathing and Patient connected to nasal cannula oxygen  Post-op Assessment: Report given to PACU RN and Post -op Vital signs reviewed and stable  Post vital signs: Reviewed and stable  Complications: No apparent anesthesia complications

## 2013-03-14 NOTE — Anesthesia Procedure Notes (Signed)
Procedure Name: Intubation Date/Time: 03/14/2013 3:33 AM Performed by: Pricilla Holm Pre-anesthesia Checklist: Patient identified, Timeout performed, Emergency Drugs available, Suction available and Patient being monitored Patient Re-evaluated:Patient Re-evaluated prior to inductionOxygen Delivery Method: Circle system utilized Intubation Type: IV induction Ventilation: Mask ventilation without difficulty Grade View: Grade II Tube type: Subglottic suction tube Tube size: 8.0 mm Number of attempts: 1 Airway Equipment and Method: Stylet and Video-laryngoscopy Placement Confirmation: ETT inserted through vocal cords under direct vision,  breath sounds checked- equal and bilateral and positive ETCO2 Secured at: 23 cm Tube secured with: Tape Dental Injury: Teeth and Oropharynx as per pre-operative assessment  Difficulty Due To: Difficulty was anticipated and Difficult Airway- due to limited oral opening

## 2013-03-14 NOTE — Anesthesia Preprocedure Evaluation (Addendum)
Anesthesia Evaluation  Patient identified by MRN, date of birth, ID band Patient awake    Reviewed: Allergy & Precautions, H&P , NPO status , Patient's Chart, lab work & pertinent test results  History of Anesthesia Complications Negative for: history of anesthetic complications  Airway Mallampati: III TM Distance: >3 FB Neck ROM: Full    Dental  (+) Teeth Intact and Dental Advisory Given   Pulmonary former smoker,  breath sounds clear to auscultation  Pulmonary exam normal       Cardiovascular hypertension, + CAD ('13 CT: L main and LAD disease: no follow-up and patient has never been symptomatic) Rhythm:Regular Rate:Normal     Neuro/Psych L > R lower extremity weakness    GI/Hepatic Neg liver ROS, GERD-  Controlled,  Endo/Other  diabetes (glu 121), Well Controlled, Type 2, Oral Hypoglycemic Agents and Insulin Dependent  Renal/GU negative Renal ROS     Musculoskeletal   Abdominal   Peds  Hematology  (+) Blood dyscrasia (Hb 8.0), anemia ,   Anesthesia Other Findings   Reproductive/Obstetrics                          Anesthesia Physical Anesthesia Plan  ASA: IV  Anesthesia Plan: General   Post-op Pain Management:    Induction: Intravenous  Airway Management Planned: Oral ETT and Video Laryngoscope Planned  Additional Equipment: Arterial line  Intra-op Plan:   Post-operative Plan: Possible Post-op intubation/ventilation  Informed Consent: I have reviewed the patients History and Physical, chart, labs and discussed the procedure including the risks, benefits and alternatives for the proposed anesthesia with the patient or authorized representative who has indicated his/her understanding and acceptance.   Dental advisory given  Plan Discussed with: Surgeon and CRNA  Anesthesia Plan Comments: (Plan routine monitors, A line, GETA with transfusion and possible post op ventilation)         Anesthesia Quick Evaluation

## 2013-03-14 NOTE — H&P (Signed)
Andrew Nelson is an 62 y.o. male.   Chief Complaint: Back pain HPI: 62 year old male with known metastatic prostate carcinoma presents with increasing back pain associated with left flank pain and bilateral leg pain and weakness left much greater than right. The symptoms have progressed over the past 2 months. The patient has gradually become essentially nonambulatory over the past 2 weeks. His pain has been in adequately managed with narcotic medications. He has some associated numbness in his left lower extremity he has no bowel or bladder dysfunction. 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 Diabetes mellitus   . Hypertension   . Prostate ca 6/07    prostate ca dx  . Allergy   . Anemia   . CAD (coronary artery disease)   . Blood transfusion without reported diagnosis     May 2013  . History of radiation therapy 09/19/12-10/10/2012    T-7-T9,35Gy/57fx,Prescaral mass 35Gy/52fx  . History of radiation therapy 05/2006-06/2006    salvage prostate rad tx 68.4Gy    Past Surgical History  Procedure Laterality Date  . Prostatectomy  03/03/06    radical,gleason 4+5=9,Adenocarcinoma  . Colonoscopy w/ polypectomy  03/01/01    hyperplastic,no adenomatous change or malignancy  . Portacath placement      right    Family History  Problem Relation Age of Onset  . Cancer Other     prostate  . Stroke Other   . Prostate cancer Father   . Colon cancer Paternal Grandfather    Social History:  reports that he has quit smoking. He has never used smokeless tobacco. He reports that he does not drink alcohol or use illicit drugs.  Allergies:  Allergies  Allergen Reactions  . Aprindine   . Bc Powder (Aspirin-Salicylamide-Caffeine) Hives  . Penicillins Hives     (Not in a hospital admission)  Results for orders placed during the hospital encounter of 03/13/13 (from  the past 48 hour(s))  COMPREHENSIVE METABOLIC PANEL     Status: Abnormal   Collection Time    03/13/13  1:58 PM      Result Value Range   Sodium 135  135 - 145 mEq/L   Potassium 3.3 (*) 3.5 - 5.1 mEq/L   Chloride 95 (*) 96 - 112 mEq/L   CO2 24  19 - 32 mEq/L   Glucose, Bld 121 (*) 70 - 99 mg/dL   BUN 11  6 - 23 mg/dL   Creatinine, Ser 2.13  0.50 - 1.35 mg/dL   Calcium 08.6  8.4 - 57.8 mg/dL   Total Protein 6.4  6.0 - 8.3 g/dL   Albumin 3.0 (*) 3.5 - 5.2 g/dL   AST 26  0 - 37 U/L   ALT 7  0 - 53 U/L   Alkaline Phosphatase 80  39 - 117 U/L   Total Bilirubin 0.4  0.3 - 1.2 mg/dL   GFR calc non Af Amer >90  >90 mL/min   GFR calc Af Amer >90  >90 mL/min   Comment:            The eGFR has been calculated     using the CKD EPI equation.     This calculation has not been     validated in all clinical     situations.     eGFR's persistently     <90 mL/min signify  possible Chronic Kidney Disease.  CBC WITH DIFFERENTIAL     Status: Abnormal   Collection Time    03/13/13  1:58 PM      Result Value Range   WBC 4.0  4.0 - 10.5 K/uL   RBC 3.19 (*) 4.22 - 5.81 MIL/uL   Hemoglobin 8.0 (*) 13.0 - 17.0 g/dL   HCT 40.9 (*) 81.1 - 91.4 %   MCV 78.7  78.0 - 100.0 fL   MCH 25.1 (*) 26.0 - 34.0 pg   MCHC 31.9  30.0 - 36.0 g/dL   RDW 78.2 (*) 95.6 - 21.3 %   Platelets 282  150 - 400 K/uL   Neutrophils Relative % 75  43 - 77 %   Neutro Abs 3.0  1.7 - 7.7 K/uL   Lymphocytes Relative 12  12 - 46 %   Lymphs Abs 0.5 (*) 0.7 - 4.0 K/uL   Monocytes Relative 13 (*) 3 - 12 %   Monocytes Absolute 0.5  0.1 - 1.0 K/uL   Eosinophils Relative 0  0 - 5 %   Eosinophils Absolute 0.0  0.0 - 0.7 K/uL   Basophils Relative 0  0 - 1 %   Basophils Absolute 0.0  0.0 - 0.1 K/uL  TROPONIN I     Status: None   Collection Time    03/13/13  1:58 PM      Result Value Range   Troponin I <0.30  <0.30 ng/mL   Comment:            Due to the release kinetics of cTnI,     a negative result within the first hours      of the onset of symptoms does not rule out     myocardial infarction with certainty.     If myocardial infarction is still suspected,     repeat the test at appropriate intervals.   Dg Chest 2 View  03/13/2013   *RADIOLOGY REPORT*  Clinical Data: Back pain, chest pain.  CHEST - 2 VIEW  Comparison: 02/23/2012  Findings: Right Port-A-Cath remains in place, unchanged.  Linear atelectasis in the lung bases.  Heart is borderline in size.  Small bilateral pleural effusions.  Vague opacity in the left mid lung.  Cannot exclude pulmonary nodule or pleural based nodule.  Consider further evaluation with chest CT.  IMPRESSION: Bibasilar atelectasis.  Small effusions.  Questionable pulmonary or pleural nodule in the left mid lung peripherally.  Recommend further evaluation with chest CT.   Original Report Authenticated By: Charlett Nose, M.D.   Ct Angio Chest Pe W/cm &/or Wo Cm  03/13/2013   *RADIOLOGY REPORT*  Clinical Data: Chest pain with tachycardia and shortness of breath. Question pulmonary embolism.  History of prostate cancer.  CT ANGIOGRAPHY CHEST  Technique:  Multidetector CT imaging of the chest using the standard protocol during bolus administration of intravenous contrast. Multiplanar reconstructed images including MIPs were obtained and reviewed to evaluate the vascular anatomy.  Contrast: 60mL OMNIPAQUE IOHEXOL 350 MG/ML SOLN  Comparison: Chest CT 08/08/2012.  Findings: The pulmonary arteries appear satisfactorily opacified with contrast.  There is no evidence of acute pulmonary embolism. There is stable relatively mild atherosclerosis of the aorta, great vessels and coronary arteries.  A right IJ Port-A-Cath extends to the superior aspect of the right atrium.  There has been interval development of small bilateral pleural effusions as well as a small to moderate pericardial effusion. Small precarinal and subcarinal lymph nodes are unchanged.  There is  linear atelectasis at both lung bases.  No lung  mass or endobronchial lesion is identified.  However, there is a pleural- based mass involving the left chest wall anteriorly adjacent to the fourth rib.  There are multiple progressive lytic lesions throughout the spine and ribs consistent with metastatic disease. There are new superior endplate compression deformities at T5 and T6 which may be pathologic. There is suspicion of posterior epidural tumor on the left at T6-T7 (axial image 31 and sagittal image 77).  Images through the upper abdomen demonstrate multiple enlarging low density liver lesions highly suspicious for metastatic disease.  IMPRESSION:  1.  No evidence of acute pulmonary embolism. 2.  Strong concern of progressive widespread metastatic disease to the bones, pleura and liver.  There are possible pathologic fractures at T5 and T6 with suspicion of posterior epidural tumor on the left at T6-T7, potentially causing cord compression. Thoracic MRI suggested for further evaluation. 3.  New bilateral pleural effusions and small to moderate pericardial effusion.  Critical Value/emergent results were called by telephone at the time of interpretation on 03/13/2013 at 1830 hours to Dr. Bernette Mayers, who verbally acknowledged these results.   Original Report Authenticated By: Carey Bullocks, M.D.   Mr Thoracic Spine Wo Contrast  03/13/2013   *RADIOLOGY REPORT*  Clinical Data:  Prostate cancer, evaluate epidural tumor  MRI THORACIC SPINE WITHOUT CONTRAST LUMBAR SPINE WITHOUT AND WITH CONTRAST  Technique:  Multiplanar and multiecho pulse sequences of the thoracic and lumbar spine were obtained without and with intravenous contrast.  Contrast: 16mL MULTIHANCE GADOBENATE DIMEGLUMINE 529 MG/ML IV SOLN  Comparison:   Prior CT performed earlier on the same date.  MRI THORACIC SPINE  Findings: The vertebral bodies are normally aligned with preservation of the normal thoracic kyphosis. Bone marrow signal intensity is markedly abnormal with diffuse osseous metastatic  disease seen throughout the thoracic spine, essentially involving all levels. Postradiation changes are seen within the T7, T8, and T9 vertebral bodies.  There is mild vertebral body height loss at the T6 level which appears chronic in nature.  Overall, there is approximately 20% of height loss at this level.  Otherwise, vertebral bodies are preserved.  At the T4-5 level, there is epidural tumor within the right lateral aspect of the thecal sac extending into the right T4-5 neural foramen. There is resultant moderate right neural foraminal stenosis without significant central canal stenosis.  Tumor is seen involving several right sided ribs.  Slightly inferiorly at the T5 level, there is abnormal convex contour of the T5 vertebral body with classic curtain deformity of the ventral epidural space, consistent with epidural tumor (series 8, image 21).  There is resultant moderate canal stenosis with slight displacement of the spinal cord dorsally at this level.  No cord signal changes are seen.  Epidural tumor at this level extends into the left T5 foramen and along the right lateral aspect of T5 anteriorly, and along the right posterior fifth rib posteriorly and laterally (series 8, image 21). There is tumor extension into the posterior elements at this level, involving the bilateral pedicles and T4-5 facet joints, right greater than left.  There is secondary moderate right foraminal stenosis at T5-6.  Prominent tumor present within the left posterior elements of T7 is visualized, with extension into the left posterolateral epidural space (series 8, image 26).  The spinal cord is displaced anteriorly and to the right with secondary severe central canal stenosis. There is no associated cord signal changes.  Small amount of epidural  tumor with cord deformity is seen inferiorly at the level of T10 (series 8, image 42).  No significant foraminal stenosis.  There is mild central canal narrowing.  There is moderate left  neural foraminal stenosis of the T11-12 left neural foramen due to particular enlargement from osseous metastatic disease.  MRI LUMBAR SPINE  Findings: The vertebral bodies are normally aligned with preservation of the normal lumbar lordosis.  Diffusely abnormal marrow signal intensity with innumerable osseous metastasis are again seen throughout the lumbar spine as well.  At the level of L1, there is epidural tumor within the right lateral epidural space abutting the right aspect of the spinal cord and resulting in mild canal stenosis.  There is tumor extension into the right neural foramen at L1-2 with nerve compression. Metastatic enlargement of the bilateral pedicles of L1 also contributes foraminal narrowing.  Inferiorly at the level of L3 - 4, there is degenerative disc bulge with a superimposed right foraminal disc protrusion which results and moderate right foraminal stenosis.  At the level of L4, there is abnormal epidural tumor within the left ventral aspect of the thecal sac with extension into the left lateral recess and left neural foramen.  There is secondary severe listhesis of the left L4-5 neural foramen. There is moderate canal stenosis at this level.  At the level of L5, a large abnormal epidural soft tissue masses present within the left ventral thecal sac which measures 1.8 x 1.5 x 2.2 cm in this compresses the left lateral recess and results in severe canal and left neural foraminal stenosis at L4-5.  The described epidural tumor enhances with post contrast images.  Innumerable osseous metastases are also seen within the sacrum and bilateral iliac wings.  Bilateral pleural effusions are present, right greater than left.  IMPRESSION: 1. Widespread osseous metastases throughout the thoracic and lumbar spine.  2. 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  3.  Ventral epidural tumor at T5 with resultant moderate canal stenosis and dorsal  displacement of the spinal cord.  No cord signal changes.  4.  Epidural tumor within the left posterolateral epidural space at T6-T7 with secondary severe central canal stenosis and dorsal displacement of the spinal cord.  No cord signal changes.  5.  Soft tissue mass within the left ventral epidural space at the L5 with secondary severe canal and left neural foraminal stenosis at L4-5.  6. Epidural tumor within the right lateral epidural space at L1 with secondary mild canal and right neural foraminal stenosis.  7.  Bilateral pleural effusions, right greater than left.  Critical Value/emergent results were called by telephone at the time of interpretation on 03/13/2013 at 11:21 pm to Dr. Susy Frizzle, who verbally acknowledged these results.   Original Report Authenticated By: Rise Mu, M.D.    Pertinent items are noted in HPI.  Blood pressure 151/73, pulse 109, temperature 99.6 F (37.6 C), temperature source Oral, resp. rate 23, SpO2 93.00%.  Patient is awake and alert. He is oriented and appropriate. Cranial nerve function is intact. Motor examination is intact strength in his upper extremities. Motor examination his lower trimming his demonstrates a mild dorsiflexion weakness in his left lower extremity with his anterior tibialis grating out of 445 and his extensor hallucis longus crating at 4 minus over 5. Remainder his motor strength intact. Sensory examination reveals a relative sensory level of the T7 level left greater than right. DTRs are normal active in his  upper trimming these and somewhat hyperactive in his lower extremities. There is no spontaneous clonus. Examination head ears nose and throat is unremarkable. Neck is supple full active range of motion. Airways midline. Carotid pulses are normal. Chest and abdomen benign. Extremities are free from injury deformity. Assessment/Plan Metastatic prostate cancer to the spine with significant epidural extension and spinal cord and  nerve root compression. I discussed situation with the patient. He recognizes no surgery will be curative but with surgery may be able to improve his functionality and quality of life. I discussed possibility of moving forward with an emergent T6-T7 laminectomy and resection of tumor. I would also perform a L5 laminectomy and resection of epidural tumor. Patient has coexistent disease at both T4 and T12 as well. At these locations the disease is reasonably small at the best treated by radiation therapy alone. I discussed the risks and benefits of the surgery including but not limited to the risk of anesthesia, bleeding, infection, CSF leak, nerve root injury, spinal cord injury, later instability, continued pain, tumor regrowth, and non-benefit. Patient wishes to go surgery. He understands he'll need further palliative treatments postop. He wishes to proceed.  Tiffony Kite A 03/14/2013, 12:26 AM

## 2013-03-15 ENCOUNTER — Telehealth: Payer: Self-pay | Admitting: Oncology

## 2013-03-15 LAB — TYPE AND SCREEN
ABO/RH(D): O POS
Unit division: 0

## 2013-03-15 LAB — GLUCOSE, CAPILLARY
Glucose-Capillary: 136 mg/dL — ABNORMAL HIGH (ref 70–99)
Glucose-Capillary: 152 mg/dL — ABNORMAL HIGH (ref 70–99)
Glucose-Capillary: 125 mg/dL — ABNORMAL HIGH (ref 70–99)

## 2013-03-15 MED ORDER — VANCOMYCIN HCL IN DEXTROSE 1-5 GM/200ML-% IV SOLN
1000.0000 mg | Freq: Two times a day (BID) | INTRAVENOUS | Status: DC
  Administered 2013-03-15 – 2013-03-16 (×2): 1000 mg via INTRAVENOUS
  Filled 2013-03-15 (×4): qty 200

## 2013-03-15 NOTE — Evaluation (Signed)
Physical Therapy Evaluation Patient Details Name: Andrew Nelson MRN: [id number] DOB: 10-Jan-1951 Today's Date: 03/15/2013 Time: 7829-5621 PT Time Calculation (min): 20 min  PT Assessment / Plan / Recommendation History of Present Illness      s/p T6-7 and L5 laminectomy; resection of tumors          Clinical Impression   s/p T6-7 and L5 laminectomy; resection of tumors   Patient is a 62 y.o. Male s/p laminectomies and resecetion of tumor surgery resulting in functional limitations due to the deficits listed below (see PT Problem List). Pt is impulsive at times and has difficulty maintaining back precautions.Patient will benefit from skilled PT to increase their independence and safety with mobility to allow discharge to the venue listed below. Will need to be able to amb up flight of steps prior to D/C home to reach bedroom.       PT Assessment  Patient needs continued PT services    Follow Up Recommendations  Home health PT;Supervision/Assistance - 24 hour    Does the patient have the potential to tolerate intense rehabilitation    n/a   Barriers to Discharge  none       Equipment Recommendations  None recommended by PT    Recommendations for Other Services     Frequency Min 5X/week    Precautions / Restrictions Precautions Precautions: Fall;Back Precaution Booklet Issued: Yes (comment) Precaution Comments: gave pt handout and reviewed back precautions     Pertinent Vitals/Pain C/o hip pain due to "pressure" did not rate pain       Mobility  Bed Mobility Bed Mobility: Rolling Left;Left Sidelying to Sit Rolling Left: 4: Min guard;With rail Left Sidelying to Sit: 4: Min guard;HOB flat;With rails Details for Bed Mobility Assistance: multimodal cues for log rolling technique; pt has tendency to be impulsive and twist in bed; mod cues to maintain back precautions; required use of handrails and min guard to facilitate log rolling techcnique   Transfers Transfers: Sit  to Stand;Stand to Sit Sit to Stand: 4: Min guard;From bed Stand to Sit: 4: Min guard;To toilet;To chair/3-in-1 Details for Transfer Assistance: min guard for sequencing and to maintain back precautions; pt requires (A) to maintain balance, pt with posterior lean with intial sit to stand Ambulation/Gait Ambulation/Gait Assistance: 4: Min guard Ambulation Distance (Feet): 150 Feet Assistive device: Other (Comment) (IV pole ) Ambulation/Gait Assistance Details: pt required UE support to steady and for safety; encouraged to continue amb with RW to increase balance  Gait Pattern: Decreased stride length;Narrow base of support Gait velocity: decreased due to pain  General Gait Details: encouraged to increase BOS to increase overall stability  Stairs: No Wheelchair Mobility Wheelchair Mobility: No         PT Diagnosis: Difficulty walking;Acute pain  PT Problem List: Decreased balance;Decreased mobility;Decreased safety awareness;Decreased knowledge of precautions;Pain PT Treatment Interventions: DME instruction;Gait training;Stair training;Functional mobility training;Therapeutic activities;Balance training;Neuromuscular re-education;Patient/family education     PT Goals(Current goals can be found in the care plan section) Acute Rehab PT Goals Patient Stated Goal: to be independent at home  PT Goal Formulation: With patient Time For Goal Achievement: 03/22/13 Potential to Achieve Goals: Good  Visit Information  Last PT Received On: 03/15/13 Assistance Needed: +1 PT/OT Co-Evaluation/Treatment: Yes       Prior Functioning  Home Living Family/patient expects to be discharged to:: Private residence Living Arrangements: Spouse/significant other Available Help at Discharge: Available 24 hours/day;Family Type of Home: House Home Access: Stairs to enter [employer]  of Steps: 3 Entrance Stairs-Rails: Right Home Layout: Two level;Bed/bath upstairs Alternate Level  Stairs-Number of Steps: 12 Alternate Level Stairs-Rails: Can reach both Home Equipment: Shower seat;Bedside commode;Other (comment);Walker - 2 wheels;Adaptive equipment (3 wheeled walker ) Adaptive Equipment: Reacher;Sock aid;Long-handled shoe horn Prior Function Level of Independence: Independent Communication Communication: No difficulties    Cognition  Cognition Arousal/Alertness: Awake/alert Behavior During Therapy: WFL for tasks assessed/performed Overall Cognitive Status: Within Functional Limits for tasks assessed    Extremity/Trunk Assessment Upper Extremity Assessment Upper Extremity Assessment: Defer to OT evaluation Lower Extremity Assessment Lower Extremity Assessment: Overall WFL for tasks assessed Cervical / Trunk Assessment Cervical / Trunk Assessment: Normal   Balance Balance Balance Assessed: Yes Static Sitting Balance Static Sitting - Balance Support: No upper extremity supported;Feet supported Static Sitting - Level of Assistance: 5: Stand by assistance  End of Session PT - End of Session Activity Tolerance: Patient tolerated treatment well Patient left: in chair;with call bell/phone within reach;with family/visitor present Nurse Communication: Mobility status  GP     Donell Sievert, [place] [phone] 03/15/2013, 10:38 AM

## 2013-03-15 NOTE — Telephone Encounter (Signed)
Received call from wife states pt is in hosp and FS told her to call to r/s 8/12 appt. Per FS lb/fu moved from 8/12 to 8/20 @ 4pm FS and 330pm lb. Per FS do not add tx. Wife given appt for 8/20 @ 3:30pm.

## 2013-03-15 NOTE — Progress Notes (Signed)
Postop day 1. Patient's pain reasonably well controlled. Mostly incisional in nature. Preoperative radicular pain resolved. Legs feel much longer period  Patient is afebrile. Vitals are stable area drain output low. Patient urinating well. He is awake and alert. Oriented and appropriate. Motor examination much improved. Now has intact dorsiflexion strength on the left. Otherwise motor exam intact. Wound is clean and dry.  Status post laminectomies for epidural tumor, metastatic prostate carcinoma. I will discuss his case with his oncologist today. We will mobilize with therapy today. Questions we plan for discharge tomorrow. I will arrange for postoperative radiation therapy.

## 2013-03-15 NOTE — Evaluation (Signed)
Occupational Therapy Evaluation Patient Details Name: Andrew Nelson MRN: [id number] DOB: 1950/12/31 Today's Date: 03/15/2013 Time: 4098-1191 OT Time Calculation (min): 22 min  OT Assessment / Plan / Recommendation History of present illness   s/p T6-7 and L5 laminectomy; resection of tumors     Clinical Impression   Patient is a 62 y.o. Male s/p laminectomies and resecetion of tumor surgery resulting in functional limitations due to the deficits listed below (see OT Problem List). Pt is impulsive at times and has difficulty maintaining back precautions.Patient will benefit from skilled OT to increase their independence and safety to allow discharge to the venue listed below.      OT Assessment  Patient needs continued OT Services    Follow Up Recommendations  Home health OT;Supervision/Assistance - 24 hour    Barriers to Discharge      Equipment Recommendations  None recommended by OT    Recommendations for Other Services    Frequency  Min 2X/week    Precautions / Restrictions Precautions Precautions: Fall;Back Precaution Booklet Issued: Yes (comment) Precaution Comments: gave pt handout and reviewed back precautions     Pertinent Vitals/Pain C/o hip pain due to "pressure" did not rate pain.       ADL  Eating/Feeding: Independent Where Assessed - Eating/Feeding: Chair Grooming: Supervision/safety;Set up Where Assessed - Grooming: Supported sitting Upper Body Bathing: Set up;Supervision/safety Where Assessed - Upper Body Bathing: Supported sitting Lower Body Bathing: Moderate assistance Where Assessed - Lower Body Bathing: Unsupported sit to stand Upper Body Dressing: Set up;Supervision/safety Where Assessed - Upper Body Dressing: Supported sitting Lower Body Dressing: Moderate assistance Where Assessed - Lower Body Dressing: Unsupported sit to stand Toilet Transfer: Performed;Supervision/safety Toilet Transfer Method: Sit to Barista:  Comfort height toilet;Grab bars Toileting - Clothing Manipulation and Hygiene: Minimal assistance;Performed;Simulated;Supervision/safety (Min A-simulated toilet hygiene and Supervision- performed clothing management of gown) Where Assessed - Toileting Clothing Manipulation and Hygiene: Standing Tub/Shower Transfer Method: Not assessed Transfers/Ambulation Related to ADLs: Minguard for ambulation and Minguard/supervision for sit <> stand transfer. ADL Comments: Overall Mod A for LB ADLs. Explained AE available to assist with this.  Cues throughout session to remain precautions and pt wanting to do things his way. Simulated toilet hygiene and cues for pt not to twist. Educated on use of toilet aide to assist with this.     OT Diagnosis: Acute pain  OT Problem List: Impaired balance (sitting and/or standing);Decreased safety awareness;Decreased knowledge of use of DME or AE;Decreased knowledge of precautions;Pain OT Treatment Interventions: Self-care/ADL training;DME and/or AE instruction;Therapeutic activities;Patient/family education;Balance training   OT Goals(Current goals can be found in the care plan section) Acute Rehab OT Goals Patient Stated Goal: to be independent at home  OT Goal Formulation: With patient Time For Goal Achievement: 03/22/13 Potential to Achieve Goals: Good ADL Goals Pt Will Perform Grooming: with modified independence;standing (while maintaining back precautions) Pt Will Perform Lower Body Bathing: with adaptive equipment;sit to/from stand;with modified independence Pt Will Perform Lower Body Dressing: with adaptive equipment;sit to/from stand;with modified independence Pt Will Transfer to Toilet: with modified independence;ambulating (3 in 1 over commode) Pt Will Perform Toileting - Clothing Manipulation and hygiene: with modified independence;sit to/from stand (with AE if needed) Pt Will Perform Tub/Shower Transfer: Shower transfer;with supervision;ambulating;3 in  1  Visit Information  Last OT Received On: 03/15/13 Assistance Needed: +1 PT/OT Co-Evaluation/Treatment: Yes       Prior Functioning     Home Living Family/patient expects to be discharged to:: Private residence  Living Arrangements: Spouse/significant other Available Help at Discharge: Available 24 hours/day;Family Type of Home: House Home Access: Stairs to enter [employer] of Steps: 3 Entrance Stairs-Rails: Right Home Layout: Two level;Bed/bath upstairs Alternate Level Stairs-Number of Steps: 12 Alternate Level Stairs-Rails: Can reach both Home Equipment: Shower seat;Bedside commode;Other (comment);Walker - 2 wheels;Adaptive equipment (3 wheeled walker) Adaptive Equipment: Reacher;Sock aid;Long-handled shoe horn Prior Function Level of Independence: Independent Communication Communication: No difficulties         Vision/Perception     Cognition  Cognition Arousal/Alertness: Awake/alert Behavior During Therapy: WFL for tasks assessed/performed Overall Cognitive Status: Within Functional Limits for tasks assessed    Extremity/Trunk Assessment Upper Extremity Assessment Upper Extremity Assessment: Overall WFL for tasks assessed     Mobility Bed Mobility Bed Mobility: Rolling Left;Left Sidelying to Sit Rolling Left: 4: Min guard;With rail Left Sidelying to Sit: 4: Min guard;HOB flat;With rails Details for Bed Mobility Assistance: multimodal cues for log rolling technique; pt has tendency to be impulsive and twist in bed; mod cues to maintain back precautions; required use of handrails and min guard to facilitate log rolling techcnique   Transfers Transfers: Sit to Stand;Stand to Sit Sit to Stand: 4: Min guard;From bed;5: Supervision;From toilet Stand to Sit: 4: Min guard;To toilet;To chair/3-in-1;5: Supervision Details for Transfer Assistance: min guard for sequencing and to maintain back precautions; pt requires (A) to maintain balance, pt with  posterior lean with intial sit to stand. Supervision for sit <> stand from toilet and cues to maintain precautions.             End of Session OT - End of Session Activity Tolerance: Patient tolerated treatment well Patient left: in chair;with call bell/phone within reach;with family/visitor present  GO     Earlie Raveling OTR/L [phone] 03/15/2013, 1:22 PM

## 2013-03-15 NOTE — Progress Notes (Signed)
SW received a consult for possible placement. PT  At this time is recommending home with HH and not SNF. CSW will make CM aware. Clinical Social Worker will sign off for now as social work intervention is no longer needed. Please consult us again if new need arises.   Craig Ionescu, MSW [phone] 

## 2013-03-16 DIAGNOSIS — C800 Disseminated malignant neoplasm, unspecified: Secondary | ICD-10-CM

## 2013-03-16 DIAGNOSIS — C61 Malignant neoplasm of prostate: Secondary | ICD-10-CM

## 2013-03-16 MED ORDER — OXYCODONE-ACETAMINOPHEN 10-325 MG PO TABS: 1.0000 | ORAL_TABLET | ORAL | Status: AC | PRN

## 2013-03-16 MED ORDER — DEXAMETHASONE 4 MG PO TABS: 2.0000 mg | ORAL_TABLET | Freq: Two times a day (BID) | ORAL | Status: DC

## 2013-03-16 NOTE — Discharge Summary (Signed)
Physician Discharge Summary  Patient ID: Andrew Nelson MRN: [id number] DOB/AGE: 1951-05-13 62 y.o.  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 Patient also with left lower extremity radicular pain and weakness secondary to metastatic tumor at L5 with significant thecal sac and L5 nerve root compression. Patient underwent emergent decompressive surgery with resection of this epidural tumor. Postoperatively he has done very well. He's had marked improvement in his back and leg pain. He is now up and ambulatory. He is urinating without difficulty and is very happy with his improvement. Patient is ready for discharge home. He is to follow up with his oncologist and radiation oncologist as an outpatient. This has been arranged.  Consults:   Significant Diagnostic Studies:   Treatments:   Discharge Exam: Blood pressure 168/68, pulse 55, temperature 98.1 F (36.7 C), temperature source Oral, resp. rate 19, SpO2 97.00%. Awake and alert. Oriented and appropriate. Cranial nerve function intact. Motor and sensory function of the extremities intact. Wound clean and dry. Chest and abdomen benign.  Disposition: 01-Home or Self Care   Future Appointments Provider Department Dept Phone   03/29/2013 2:30 PM [facility] Nurse [facility] RAD[facility] ONCOL[facility]Y [phone]   03/29/2013 3:00 PM [name], MD [facility] RADIATION ONCOLOGY [phone]   03/29/2013 3:30 PM Mauri [place] [facility] MEDICAL  ONCOLOGY [phone]   03/29/2013 3:30 PM [facility] Nurse [facility] MEDICAL ONCOL[facility] [phone]   03/29/2013 4:00 PM [name], MD [facility] MEDICAL ONCOLOGY [phone]   03/30/2013 1:15 PM Gordy Savers, MD [facility] [phone]       Medication List         ACCU-CHEK AVIVA PLUS test strip  Generic drug:  glucose blood  USE AS DIRECTED     CITRACAL/VITAMIN D PO  Take 1 tablet by mouth daily. 1200 mg calcium and 1000 mg vitamin d     dexamethasone 4 MG tablet  Commonly known as:  DECADRON  Take 0.5 tablets (2 mg total) by mouth 2 (two) times daily with a meal.     doxazosin 8 MG tablet  Commonly known as:  CARDURA  Take 8 mg by mouth at bedtime.     fentaNYL 50 MCG/HR  Commonly known as:  DURAGESIC - dosed mcg/hr  Place 1 patch onto the skin every 3 (three) days. Pt put patch on Sunday. Pt takes a total of 75 mg dose     fentaNYL 25 MCG/HR patch  Commonly known as:  DURAGESIC - dosed mcg/hr  Place 1 patch onto the skin every 3 (three) days. Pt put patch on Sunday. Pt takes a total of 75mg  dose     FISH OIL TRIPLE STRENGTH 1400 MG Caps  Take 1,400 mg by mouth daily.     hydrochlorothiazide 25 MG tablet  Commonly known as:  HYDRODIURIL  Take 25 mg by mouth daily.     insulin aspart protamine- aspart (70-30) 100 UNIT/ML injection  Commonly known as:  NOVOLOG MIX 70/30  Inject 20 Units into the skin daily  with supper.     leuprolide 30 MG injection  Commonly known as:  LUPRON  Inject 30 mg into the muscle every 4 (four) months. Pt had 2 weeks ago     lidocaine-prilocaine cream  Commonly known as:  EMLA  Apply 1 application topically daily as needed (APPLY TO PORTA-CATH SITE 30 MINS TO 2 HOURS PRIOR TO TREATMENT & COVER WITH PLASTIC WRAP).     lisinopril 10 MG tablet  Commonly known as:  PRINIVIL,ZESTRIL  Take 10 mg by mouth daily.     metFORMIN 850 MG tablet  Commonly known as:   GLUCOPHAGE  Take 850 mg by mouth 2 (two) times daily with a meal.     oxyCODONE-acetaminophen 10-325 MG per tablet  Commonly known as:  PERCOCET  Take 1-2 tablets by mouth every 4 (four) hours as needed for pain.     senna 8.6 MG Tabs tablet  Commonly known as:  SENOKOT  Take 1 tablet by mouth.           Follow-up Information   Follow up with Arval Brandstetter A, MD. Schedule an appointment as soon as possible for a visit in 2 weeks. (ext [phone])    Contact information:   [address] [place] [address] [phone]       Signed: Feliberto Stockley A 03/16/2013, 8:45 AM

## 2013-03-16 NOTE — Progress Notes (Signed)
Patient discharged to home accompanied by wife.  Discharge instructions and rx given and explained and patient stated understanding.  IVs were removed.  Patient left unit in a stable condition via wheelchair.

## 2013-03-16 NOTE — Progress Notes (Signed)
Talked to patient about home health care choices; patient stated that he does not want HHC at this time and feels as that he will be ok at home. CM informed patient that if he changes his mind and wants home health care services after discharge to contact his primary care physician and services can be arranged. Abelino Derrick RN,BSN,MHA

## 2013-03-16 NOTE — Progress Notes (Signed)
Occupational Therapy Treatment Patient Details Name: Andrew Nelson MRN: [id number] DOB: 1951-01-24 Today's Date: 03/16/2013 Time: 4098-1191 OT Time Calculation (min): 12 min  OT Assessment / Plan / Recommendation       OT comments  Pt. Able to recall 3/3 back precautions but still requires cues to maintain them during functional tasks.  Min a with a/e for lb dressing.  Wife reports she has a kit already at home from previous surgery.  Min guard for shower stall transfer to navigate over small ledge.  Pt. Able to demo without use of rw for support as he states it will likely not fit in his b.room.   Follow Up Recommendations  Home health OT;Supervision/Assistance - 24 hour           Equipment Recommendations  None recommended by OT        Frequency Min 2X/week   Progress towards OT Goals Progress towards OT goals: Progressing toward goals  Plan Discharge plan remains appropriate    Precautions / Restrictions Precautions Precautions: Fall;Back Precaution Booklet Issued: Yes (comment) Precaution Comments: able to recall 3/3 precautions   Pertinent Vitals/Pain 5/10 per pt., but states it is tolerable    ADL  Lower Body Bathing: Simulated;Modified independent Where Assessed - Lower Body Bathing: Supported sitting Lower Body Dressing: Performed;Minimal assistance Where Assessed - Lower Body Dressing: Supported sitting Toilet Transfer: Buyer, retail Method: Sit to stand Toileting - Architect and Hygiene: Simulated;Modified independent Where Assessed - Toileting Clothing Manipulation and Hygiene: Standing Tub/Shower Transfer: Simulated;Min guard Tub/Shower Transfer Method: Science writer: Grab bars;Walk in shower Transfers/Ambulation Related to ADLs: s for all transfers, min guard for transfer in/out of shower  ADL Comments: min a with a/e for lb dressing (wife has kit at home from previous surgery), min  guard for shower stall transfer to step over small ledge, pt. states walker will not likely fit into the b.room but was able to demonstrate safety with int. furniture walking to navigate into shower.      OT Goals(current goals can now be found in the care plan section) Acute Rehab OT Goals Patient Stated Goal: to be independent at home   Visit Information  Last OT Received On: 03/16/13 Assistance Needed: +1          Prior Functioning      Cognition  Cognition Arousal/Alertness: Awake/alert Behavior During Therapy: WFL for tasks assessed/performed Overall Cognitive Status: Within Functional Limits for tasks assessed    Mobility  Bed Mobility Bed Mobility: Rolling Left;Left Sidelying to Sit;Sit to Sidelying Left Rolling Left: 5: Supervision Left Sidelying to Sit: 5: Supervision;HOB flat Sit to Sidelying Left: 5: Supervision;HOB flat Details for Bed Mobility Assistance: cues to reinforce back precautions & technique.   Transfers Transfers: Sit to Stand;Stand to Sit Sit to Stand: 5: Supervision;With upper extremity assist;With armrests;From chair/3-in-1 Stand to Sit: 5: Supervision;With upper extremity assist;With armrests;To chair/3-in-1 Details for Transfer Assistance: s    Exercises       Balance Balance Assessed: No   End of Session OT - End of Session Activity Tolerance: Patient tolerated treatment well Patient left: Other (comment) (in bathroom), reminded pt. To pull string for assistance when finished in b.room he verbalized understanding and requested privacy       Robet Leu, COTA/L 03/16/2013, 9:44 AM

## 2013-03-16 NOTE — Progress Notes (Signed)
Physical Therapy Treatment Patient Details Name: Andrew Nelson MRN: [id number] DOB: Sep 22, 1950 Today's Date: 03/16/2013 Time: 1324-4010 PT Time Calculation (min): 16 min  PT Assessment / Plan / Recommendation  History of Present Illness     PT Comments   Pt moving well but cont's to require cues to verbalize & maintain back precautions with functional mobility.  Ambulated with & without RW today.  No increased unsteadiness noted without AD but he was more smooth with RW & pt states he feels more comfortable with RW at this time.   Pt also negotiated flight of steps as he has at home to get to bedroom.     Follow Up Recommendations  Home health PT;Supervision/Assistance - 24 hour     Does the patient have the potential to tolerate intense rehabilitation     Barriers to Discharge        Equipment Recommendations  None recommended by PT    Recommendations for Other Services    Frequency Min 5X/week   Progress towards PT Goals Progress towards PT goals: Progressing toward goals  Plan Current plan remains appropriate    Precautions / Restrictions Precautions Precautions: Fall;Back Precaution Booklet Issued: Yes (comment) Precaution Comments: Reviewed back precautions.     Pertinent Vitals/Pain C/o mild discomfort back, shoulders, & across hips.  Did not rate at this time.      Mobility  Bed Mobility Bed Mobility: Rolling Left;Left Sidelying to Sit;Sit to Sidelying Left Rolling Left: 5: Supervision Left Sidelying to Sit: 5: Supervision;HOB flat Sit to Sidelying Left: 5: Supervision;HOB flat Details for Bed Mobility Assistance: cues to reinforce back precautions & technique.   Transfers Transfers: Sit to Stand;Stand to Sit Sit to Stand: 5: Supervision;From bed;From chair/3-in-1;With upper extremity assist Stand to Sit: 5: Supervision;With upper extremity assist;With armrests;To bed;To chair/3-in-1 Ambulation/Gait Ambulation/Gait Assistance: 4: Min guard;5:  Supervision Ambulation Distance (Feet): 250 Feet Assistive device: Rolling walker;None Ambulation/Gait Assistance Details: Supervision with RW & Min guard with no AD.  No unsteadiness noted without AD but he was more comfortable & smooth with gait with RW.    Gait Pattern: Step-through pattern;Decreased stride length Stairs: Yes Stairs Assistance: 4: Min guard Stair Management Technique: One rail Right;Step to pattern;Forwards Number of Stairs: 12 Wheelchair Mobility Wheelchair Mobility: No        PT Goals (current goals can now be found in the care plan section) Acute Rehab PT Goals Patient Stated Goal: to be independent at home  PT Goal Formulation: With patient Time For Goal Achievement: 03/22/13 Potential to Achieve Goals: Good  Visit Information  Last PT Received On: 03/16/13 Assistance Needed: +1    Subjective Data  Patient Stated Goal: to be independent at home    Cognition  Cognition Arousal/Alertness: Awake/alert Behavior During Therapy: WFL for tasks assessed/performed Overall Cognitive Status: Within Functional Limits for tasks assessed    Balance  Balance Balance Assessed: No  End of Session PT - End of Session Equipment Utilized During Treatment: Gait belt Activity Tolerance: Patient tolerated treatment well Patient left: in chair;with call bell/phone within reach;with family/visitor present Nurse Communication: Mobility status   Andrew Nelson     Lara Mulch 03/16/2013, 9:17 AM   Verdell Face, PTA [phone] 03/16/2013

## 2013-03-20 ENCOUNTER — Encounter (HOSPITAL_COMMUNITY): Payer: Self-pay | Admitting: Neurosurgery

## 2013-03-21 ENCOUNTER — Ambulatory Visit: Payer: BC Managed Care – PPO

## 2013-03-21 ENCOUNTER — Other Ambulatory Visit: Payer: BC Managed Care – PPO | Admitting: Lab

## 2013-03-21 ENCOUNTER — Ambulatory Visit: Payer: BC Managed Care – PPO | Admitting: Oncology

## 2013-03-21 ENCOUNTER — Other Ambulatory Visit: Payer: Self-pay | Admitting: *Deleted

## 2013-03-21 MED ORDER — FENTANYL 25 MCG/HR TD PT72: 1.0000 | MEDICATED_PATCH | TRANSDERMAL | Status: DC

## 2013-03-21 NOTE — Telephone Encounter (Signed)
Wife calling for more 25 mcg fentanyl patches. They have 50 mcg. Does not want 75 mcg patches. Patient thinks 66 and 25 works well. Wife notified script left at front for p/u.

## 2013-03-28 ENCOUNTER — Encounter: Payer: BC Managed Care – PPO | Admitting: Family Medicine

## 2013-03-29 ENCOUNTER — Encounter: Payer: Self-pay | Admitting: Radiation Oncology

## 2013-03-29 ENCOUNTER — Other Ambulatory Visit (HOSPITAL_BASED_OUTPATIENT_CLINIC_OR_DEPARTMENT_OTHER): Payer: BC Managed Care – PPO

## 2013-03-29 ENCOUNTER — Ambulatory Visit (HOSPITAL_BASED_OUTPATIENT_CLINIC_OR_DEPARTMENT_OTHER): Payer: BC Managed Care – PPO | Admitting: Oncology

## 2013-03-29 ENCOUNTER — Ambulatory Visit (HOSPITAL_BASED_OUTPATIENT_CLINIC_OR_DEPARTMENT_OTHER): Payer: BC Managed Care – PPO

## 2013-03-29 ENCOUNTER — Encounter (HOSPITAL_COMMUNITY): Payer: Self-pay | Admitting: *Deleted

## 2013-03-29 ENCOUNTER — Inpatient Hospital Stay (HOSPITAL_COMMUNITY)
Admission: AD | Admit: 2013-03-29 | Discharge: 2013-04-05 | DRG: 836 | Disposition: A | Discharge status: Rehabilitation Facility | Payer: BC Managed Care – PPO | Source: Ambulatory Visit | Attending: Internal Medicine | Admitting: Internal Medicine

## 2013-03-29 ENCOUNTER — Ambulatory Visit
Admission: RE | Admit: 2013-03-29 | Discharge: 2013-03-29 | Disposition: A | Discharge status: Home / Self Care | Payer: BC Managed Care – PPO | Source: Ambulatory Visit | Attending: Radiation Oncology | Admitting: Radiation Oncology

## 2013-03-29 VITALS — BP 138/65 | HR 104 | Temp 99.0°F | Resp 16 | Ht 67.0 in | Wt 174.0 lb

## 2013-03-29 DIAGNOSIS — C61 Malignant neoplasm of prostate: Secondary | ICD-10-CM

## 2013-03-29 DIAGNOSIS — Z923 Personal history of irradiation: Secondary | ICD-10-CM

## 2013-03-29 DIAGNOSIS — Z87891 Personal history of nicotine dependence: Secondary | ICD-10-CM

## 2013-03-29 DIAGNOSIS — C7949 Secondary malignant neoplasm of other parts of nervous system: Secondary | ICD-10-CM

## 2013-03-29 DIAGNOSIS — R29898 Other symptoms and signs involving the musculoskeletal system: Secondary | ICD-10-CM | POA: Diagnosis present

## 2013-03-29 DIAGNOSIS — C7951 Secondary malignant neoplasm of bone: Secondary | ICD-10-CM | POA: Diagnosis present

## 2013-03-29 DIAGNOSIS — Z794 Long term (current) use of insulin: Secondary | ICD-10-CM

## 2013-03-29 DIAGNOSIS — R64 Cachexia: Secondary | ICD-10-CM | POA: Diagnosis present

## 2013-03-29 DIAGNOSIS — C775 Secondary and unspecified malignant neoplasm of intrapelvic lymph nodes: Secondary | ICD-10-CM

## 2013-03-29 DIAGNOSIS — Z8546 Personal history of malignant neoplasm of prostate: Secondary | ICD-10-CM

## 2013-03-29 DIAGNOSIS — E871 Hypo-osmolality and hyponatremia: Secondary | ICD-10-CM | POA: Diagnosis present

## 2013-03-29 DIAGNOSIS — Z452 Encounter for adjustment and management of vascular access device: Secondary | ICD-10-CM

## 2013-03-29 DIAGNOSIS — E109 Type 1 diabetes mellitus without complications: Secondary | ICD-10-CM | POA: Diagnosis present

## 2013-03-29 DIAGNOSIS — D649 Anemia, unspecified: Secondary | ICD-10-CM

## 2013-03-29 DIAGNOSIS — I10 Essential (primary) hypertension: Secondary | ICD-10-CM | POA: Diagnosis present

## 2013-03-29 DIAGNOSIS — M87059 Idiopathic aseptic necrosis of unspecified femur: Secondary | ICD-10-CM | POA: Diagnosis present

## 2013-03-29 DIAGNOSIS — E440 Moderate protein-calorie malnutrition: Secondary | ICD-10-CM | POA: Diagnosis present

## 2013-03-29 DIAGNOSIS — D509 Iron deficiency anemia, unspecified: Secondary | ICD-10-CM | POA: Diagnosis present

## 2013-03-29 DIAGNOSIS — M4714 Other spondylosis with myelopathy, thoracic region: Secondary | ICD-10-CM | POA: Diagnosis present

## 2013-03-29 DIAGNOSIS — G959 Disease of spinal cord, unspecified: Principal | ICD-10-CM | POA: Diagnosis present

## 2013-03-29 DIAGNOSIS — D638 Anemia in other chronic diseases classified elsewhere: Secondary | ICD-10-CM | POA: Diagnosis present

## 2013-03-29 DIAGNOSIS — Z6825 Body mass index (BMI) 25.0-25.9, adult: Secondary | ICD-10-CM

## 2013-03-29 LAB — COMPREHENSIVE METABOLIC PANEL
ALT: 23 U/L (ref 0–53)
Albumin: 3.2 g/dL — ABNORMAL LOW (ref 3.5–5.2)
BUN: 21 mg/dL (ref 6–23)
CO2: 22 mEq/L (ref 19–32)
Calcium: 9.8 mg/dL (ref 8.4–10.5)
Chloride: 96 mEq/L (ref 96–112)
Creatinine, Ser: 0.5 mg/dL (ref 0.50–1.35)
Potassium: 4.4 mEq/L (ref 3.5–5.3)

## 2013-03-29 LAB — CBC WITH DIFFERENTIAL/PLATELET
Basophils Absolute: 0 10*3/uL (ref 0.0–0.1)
Eosinophils Absolute: 0 10*3/uL (ref 0.0–0.5)
HCT: 38.2 % — ABNORMAL LOW (ref 38.4–49.9)
HGB: 12.3 g/dL — ABNORMAL LOW (ref 13.0–17.1)
LYMPH%: 4.2 % — ABNORMAL LOW (ref 14.0–49.0)
MCV: 81.6 fL (ref 79.3–98.0)
MONO#: 0.9 10*3/uL (ref 0.1–0.9)
MONO%: 6.1 % (ref 0.0–14.0)
NEUT#: 12.5 10*3/uL — ABNORMAL HIGH (ref 1.5–6.5)
Platelets: ADEQUATE 10*3/uL (ref 140–400)
WBC: 14 10*3/uL — ABNORMAL HIGH (ref 4.0–10.3)

## 2013-03-29 LAB — GLUCOSE, CAPILLARY: Glucose-Capillary: 120 mg/dL — ABNORMAL HIGH (ref 70–99)

## 2013-03-29 LAB — TECHNOLOGIST REVIEW

## 2013-03-29 MED ORDER — HYDROCHLOROTHIAZIDE 25 MG PO TABS
25.0000 mg | ORAL_TABLET | Freq: Every day | ORAL | Status: DC
  Administered 2013-03-30 – 2013-04-05 (×7): 25 mg via ORAL
  Filled 2013-03-29 (×7): qty 1

## 2013-03-29 MED ORDER — MORPHINE SULFATE 2 MG/ML IJ SOLN
1.0000 mg | INTRAMUSCULAR | Status: DC | PRN
  Administered 2013-03-29 – 2013-03-31 (×7): 1 mg via INTRAVENOUS
  Filled 2013-03-29 (×5): qty 1

## 2013-03-29 MED ORDER — LISINOPRIL 10 MG PO TABS
10.0000 mg | ORAL_TABLET | Freq: Every day | ORAL | Status: DC
  Administered 2013-03-30 – 2013-04-05 (×7): 10 mg via ORAL
  Filled 2013-03-29 (×7): qty 1

## 2013-03-29 MED ORDER — FENTANYL 25 MCG/HR TD PT72
25.0000 ug | MEDICATED_PATCH | TRANSDERMAL | Status: DC
  Administered 2013-03-31 – 2013-04-03 (×2): 25 ug via TRANSDERMAL
  Filled 2013-03-29 (×2): qty 1

## 2013-03-29 MED ORDER — MORPHINE SULFATE 4 MG/ML IJ SOLN
INTRAMUSCULAR | Status: AC
  Filled 2013-03-29: qty 1

## 2013-03-29 MED ORDER — INSULIN ASPART PROT & ASPART (70-30 MIX) 100 UNIT/ML ~~LOC~~ SUSP
20.0000 [IU] | Freq: Every day | SUBCUTANEOUS | Status: DC
  Administered 2013-03-31 – 2013-04-04 (×5): 20 [IU] via SUBCUTANEOUS
  Filled 2013-03-29 (×4): qty 10

## 2013-03-29 MED ORDER — LEUPROLIDE ACETATE (4 MONTH) 30 MG IM KIT: 30.0000 mg | PACK | INTRAMUSCULAR | Status: DC

## 2013-03-29 MED ORDER — FENTANYL 50 MCG/HR TD PT72
50.0000 ug | MEDICATED_PATCH | TRANSDERMAL | Status: DC
  Administered 2013-03-31 – 2013-04-03 (×2): 50 ug via TRANSDERMAL
  Filled 2013-03-29 (×2): qty 1

## 2013-03-29 MED ORDER — DOXAZOSIN MESYLATE 8 MG PO TABS
8.0000 mg | ORAL_TABLET | Freq: Every day | ORAL | Status: DC
  Administered 2013-03-31 – 2013-04-04 (×5): 8 mg via ORAL
  Filled 2013-03-29 (×9): qty 1

## 2013-03-29 MED ORDER — SENNA 8.6 MG PO TABS
1.0000 | ORAL_TABLET | Freq: Every day | ORAL | Status: DC
  Administered 2013-03-30: 8.6 mg via ORAL
  Filled 2013-03-29 (×2): qty 1

## 2013-03-29 MED ORDER — HEPARIN SODIUM (PORCINE) 5000 UNIT/ML IJ SOLN
5000.0000 [IU] | Freq: Three times a day (TID) | INTRAMUSCULAR | Status: DC
  Administered 2013-03-29 – 2013-04-05 (×19): 5000 [IU] via SUBCUTANEOUS
  Filled 2013-03-29 (×25): qty 1

## 2013-03-29 MED ORDER — SODIUM CHLORIDE 0.9 % IV SOLN
INTRAVENOUS | Status: DC
  Administered 2013-03-30 – 2013-04-04 (×2): via INTRAVENOUS

## 2013-03-29 MED ORDER — CALCIUM CITRATE-VITAMIN D 250-200 MG-UNIT PO TABS: 1.0000 | ORAL_TABLET | Freq: Every day | ORAL | Status: DC

## 2013-03-29 MED ORDER — DEXAMETHASONE SODIUM PHOSPHATE 10 MG/ML IJ SOLN
10.0000 mg | Freq: Two times a day (BID) | INTRAMUSCULAR | Status: DC
  Administered 2013-03-29 – 2013-03-30 (×2): 10 mg via INTRAVENOUS
  Filled 2013-03-29 (×5): qty 1

## 2013-03-29 MED ORDER — OMEGA-3-ACID ETHYL ESTERS 1 G PO CAPS: 1.0000 g | ORAL_CAPSULE | Freq: Every day | ORAL | Status: DC

## 2013-03-29 MED ORDER — LIDOCAINE-PRILOCAINE 2.5-2.5 % EX CREA: 1.0000 "application " | TOPICAL_CREAM | Freq: Every day | CUTANEOUS | Status: DC | PRN

## 2013-03-29 MED ORDER — INSULIN ASPART 100 UNIT/ML ~~LOC~~ SOLN
0.0000 [IU] | SUBCUTANEOUS | Status: DC
  Administered 2013-03-30: 3 [IU] via SUBCUTANEOUS
  Administered 2013-03-30: 2 [IU] via SUBCUTANEOUS

## 2013-03-29 NOTE — Progress Notes (Signed)
Patient Active Problem List   Diagnosis Date Noted  . Prostate cancer metastatic to multiple sites 03/29/2013  . 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 Prostate ca   . Hypertension   . Avascular necrosis of femur head, right 02/25/2012  . FATIGUE 09/30/2009  . ANEMIA-IRON DEFICIENCY 05/28/2008  . PERSONAL HX COLONIC POLYPS 05/28/2008  . COLONIC POLYPS 05/25/2008  . DM w/o Complication Type I 10/14/2007  . Unspecified Anemia 04/12/2007  . TOBACCO ABUSE 04/12/2007  . Unspecified essential hypertension 04/12/2007  . PROSTATE CANCER, HX OF 04/12/2007   62 year old male with history of metastatic prostate cancer status post laparoscopic prostatectomy, laminectomy T6/T7 for cord compression back in 8/7 2014 has gotten several rounds of radiation (from 2/10 2014- 3/3 2014) and chemotherapy, when he saw his oncologist and was found to have lower extremity weakness, so he was referred for emergent MRI empiric Decadron and possible radiation.

## 2013-03-29 NOTE — Progress Notes (Signed)
See progress note under physician encounter. 

## 2013-03-29 NOTE — Progress Notes (Signed)
Reports taking decadron 2 mg bid. Reports using fentanyl 75 mcg and oxycodone 20 mg bid for pain. Patient states this manages his pain "OK." Confirms he had a bowel movement on Monday. Reports pain in his right upper chest and entire back 6-7 on a scale of 0-10. Reports that following surgery he was able to ambulate some by dragging his feet. Reports today that he is unable to move or control his legs. Wife confirms this by stating, "i have had to lift and move his legs to get him in the car and wheelchair." Reports numbness in hand and feet since surgery. Denies nausea, vomiting, headache or dizziness.

## 2013-03-29 NOTE — Progress Notes (Signed)
Histology and Location of Primary Cancer: prostate cancer diagnosed 2007  Sites of Visceral and Bony Metastatic Disease: multiple areas of epidural tumor extension including T4 on the right which does not cause significant spinal cord compression T6-7 dorsolaterally on the left which causes marked spinal cord compression T12 on the right with minimal thecal sac compression and L5 on the left with marked L5 nerve root compression. Patient presents now for laminectomy and resection of his epidural tumor at both T6-7 and L5.    Location(s) of Symptomatic Metastases:metastatic tumor at L5 with significant thecal sac and L5 nerve root compression    Past/Anticipated chemotherapy by medical oncology, if any: None  Pain on a scale of 0-10 is:  Radicular pain   If Spine Met(s), symptoms, if any, include:  Bowel/Bladder retention or incontinence (please describe): urinating without difficulty   Numbness or weakness in extremities (please describe): weakness reported  Current Decadron regimen, if applicable: Decadron 2 mg bid  Ambulatory status? Walker? Wheelchair?: non ambulatory prior to laminectomy  SAFETY ISSUES: Prior radiation?  1. Prostatic Fossa salvage radiation therapy due to PSA of 0.65 in October-November 2007 to 68.4 Gy  2. T7-T9 was treated to 35 Gy from 09/19/2012-10/10/2012 for prevention of spinal cord injury  3. The presacral nodal mass was treated to 35 Gy from 09/19/2012-10/10/2012 for Palliation of pain    Pacemaker/ICD? No  Possible current pregnancy? N/A  Is the patient on methotrexate? NO  Current Complaints / other details:  62 year old male with known metastatic prostate carcinoma presented on 03/14/2013 with increasing back pain associated with left flank pain and bilateral leg pain and weakness left much greater than right. The symptoms progressed over the past 2 months. The patient had gradually become essentially nonambulatory over the past 2 weeks. His pain has been  in adequately managed with narcotic medications. He has some associated numbness in his left lower extremity he has no bowel or bladder dysfunction. He has known metastasis to his pelvis and has a history of avascular necrosis of his right hip complicating things further. Laminectomy performed to T6 and T7 on 03/14/2013.

## 2013-03-29 NOTE — H&P (Addendum)
Hospitalist Admission History and Physical  Patient name: Andrew Nelson Medical record number: [id number] Date of birth: 1950/11/06 Age: 62 y.o. Gender: male  Primary Care Provider: [name], MD  Chief Complaint: LE weakness, paraplegia  Oncology: Clelia Croft  Rad-OncKathrynn Running  Neurosurgery: Pool  History of Present Illness:This is a 62 y.o. year old male with significant past medical history of multiple medical problems including metastatic prostate cancer with recent thoracic and lumbar spinal surgery 03/13/2013 for tumor decompression presenting with acute onset of lower extremity weakness/paraplegia. Patient was seen by his oncologist today for general followup visit. Patient reported that he had acute onset of lower extremity weakness to the point that he could not get up out of bed. Patient states this is a new finding. Prior to today, patient states that he was able to ambulate with minimal effort. Patient is still urinating. However, patient not had a bowel movement around 48 hours. Patient denies any fevers or chills. No reported tenderness or purulent drainage from operative site. No recent medication changes. Patient was evaluated by his oncologist and sent to the hospital via direct admission for further evaluation of new onset lower extremity weakness.  Patient Active Problem List   Diagnosis Date Noted  . Prostate cancer metastatic to multiple sites 03/29/2013  . 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 Prostate ca   . Hypertension   . Avascular necrosis of femur head, right 02/25/2012  . FATIGUE 09/30/2009  . ANEMIA-IRON DEFICIENCY 05/28/2008  . PERSONAL HX COLONIC POLYPS 05/28/2008  . COLONIC POLYPS 05/25/2008  . DM w/o Complication Type I 10/14/2007  . Unspecified Anemia 04/12/2007  . TOBACCO ABUSE 04/12/2007  . Unspecified essential hypertension 04/12/2007  . PROSTATE CANCER, HX OF 04/12/2007   Past Medical History: Past Medical History  Diagnosis  Date  . Diabetes mellitus   . Hypertension   . Prostate ca 6/07    prostate ca dx  . Allergy   . Anemia   . CAD (coronary artery disease)   . Blood transfusion without reported diagnosis     May 2013  . History of radiation therapy 09/19/12-10/10/2012    T-7-T9,35Gy/75fx,Prescaral mass 35Gy/53fx  . History of radiation therapy 05/2006-06/2006    salvage prostate rad tx 68.4Gy  . Bone cancer     prostate ca with mets to spine    Past Surgical History: Past Surgical History  Procedure Laterality Date  . Prostatectomy  03/03/06    radical,gleason 4+5=9,Adenocarcinoma  . Colonoscopy w/ polypectomy  03/01/01    hyperplastic,no adenomatous change or malignancy  . Portacath placement      right  . Laminectomy N/A 03/14/2013    Procedure: Thoracic six-seven laminectomy, resection of tumor. Lumbar five laminectomy and resection of tumor;  Surgeon: Temple Pacini, MD;  Location: [facility];  Service: Neurosurgery;  Laterality: N/A;    Social History: History   Social History  . Marital Status: Married    Spouse Name: N/A    Number of Children: N/A  . Years of Education: N/A   Social History Main Topics  . Smoking status: Former Smoker -- 0.25 packs/day for 25 years  . Smokeless tobacco: Never Used  . Alcohol Use: No  . Drug Use: No     Comment: quit 10/2005  . Sexual Activity: None   Other Topics Concern  . None   Social History Narrative  . None    Family History: Family History  Problem Relation Age of Onset  . Cancer  Other     prostate  . Stroke Other   . Prostate cancer Father   . Colon cancer Paternal Grandfather     Allergies: Allergies  Allergen Reactions  . Aprindine   . Bc Powder [Aspirin-Salicylamide-Caffeine] Hives  . Penicillins Hives    Current Facility-Administered Medications  Medication Dose Route Frequency Provider Last Rate Last Dose  . 0.9 %  sodium chloride infusion   Intravenous Continuous Doree Albee, MD      . Calcium Citrate-Vitamin  D 250-200 MG-UNIT TABS 1 tablet  1 tablet Oral Daily Doree Albee, MD      . dexamethasone (DECADRON) injection 10 mg  10 mg Intravenous Q12H Doree Albee, MD      . doxazosin (CARDURA) tablet 8 mg  8 mg Oral QHS Doree Albee, MD      . fentaNYL (DURAGESIC - dosed mcg/hr) 50 mcg  50 mcg Transdermal Q72H Doree Albee, MD      . fentaNYL (DURAGESIC - dosed mcg/hr) patch 25 mcg  25 mcg Transdermal Q72H Doree Albee, MD      . heparin injection 5,000 Units  5,000 Units Subcutaneous Q8H Doree Albee, MD      . hydrochlorothiazide (HYDRODIURIL) tablet 25 mg  25 mg Oral Daily Doree Albee, MD      . insulin aspart (novoLOG) injection 0-15 Units  0-15 Units Subcutaneous Q4H Doree Albee, MD      . insulin aspart protamine- aspart (NOVOLOG MIX 70/30) injection 20 Units  20 Units Subcutaneous Q supper Doree Albee, MD      . leuprolide (LUPRON) injection 30 mg  30 mg Intramuscular Q4 months Doree Albee, MD      . lidocaine-prilocaine (EMLA) cream 1 application  1 application Topical Daily PRN Doree Albee, MD      . lisinopril (PRINIVIL,ZESTRIL) tablet 10 mg  10 mg Oral Daily Doree Albee, MD      . omega-3 acid ethyl esters (LOVAZA) capsule 1 g  1 g Oral Daily Doree Albee, MD      . senna [facility]) tablet 8.6 mg  1 tablet Oral Daily Doree Albee, MD       Facility-Administered Medications Ordered in Other Encounters  Medication Dose Route Frequency Provider Last Rate Last Dose  . heparin lock flush 100 unit/mL  500 Units Intravenous Once [name], MD      . sodium chloride 0.9 % injection 10 mL  10 mL Intravenous PRN [name], MD       Review Of Systems: 12 point ROS negative except as noted above in HPI.  Physical Exam: There were no vitals filed for this visit.  General: alert and cooperative HEENT: PERRLA and extra ocular movement intact Heart: S1, S2 normal, no murmur, rub or gallop, regular rate and rhythm Lungs: clear to auscultation, no wheezes or rales and  unlabored breathing Abdomen: abdomen is soft without significant tenderness, masses, organomegaly or guarding, lumbar incision site CDI, non tender Extremities: extremities normal, atraumatic, no cyanosis or edema Skin:no rashes, no ecchymoses Neurology: normal without focal findings, mental status, speech normal, alert and oriented x3, PERLA, reflexes normal and symmetric and sensation and strength intact distally, 3-4/5 strength in LEs bilaterally   Labs and Imaging: Lab Results  Component Value Date/Time   NA 134* 03/29/2013  4:18 PM   NA 138 02/28/2013 12:16 PM   NA 141 08/13/2011 11:29 AM   K 4.4 03/29/2013  4:18 PM   K 3.7 02/28/2013 12:16 PM   K 3.7  08/13/2011 11:29 AM   CL 96 03/29/2013  4:18 PM   CL 100 01/17/2013 11:02 AM   CL 103 08/13/2011 11:29 AM   CO2 22 03/29/2013  4:18 PM   CO2 26 02/28/2013 12:16 PM   CO2 28 08/13/2011 11:29 AM   BUN 21 03/29/2013  4:18 PM   BUN 11.4 02/28/2013 12:16 PM   BUN 15 08/13/2011 11:29 AM   CREATININE 0.50 03/29/2013  4:18 PM   CREATININE 0.7 02/28/2013 12:16 PM   CREATININE 0.5* 08/13/2011 11:29 AM   GLUCOSE 123* 03/29/2013  4:18 PM   GLUCOSE 122 02/28/2013 12:16 PM   GLUCOSE 135* 01/17/2013 11:02 AM   GLUCOSE 161* 08/13/2011 11:29 AM   Lab Results  Component Value Date   WBC 14.0* 03/29/2013   HGB 12.3* 03/29/2013   HCT 38.2* 03/29/2013   MCV 81.6 03/29/2013   PLT Clumped Platelets--Appears Adequate 03/29/2013   No results found.   Assessment and Plan: Andrew Nelson is a 63 y.o. year old male presenting with new onset lower extremity weakness/paraplegia   Lower extremity weakness: Will set up patient for urgent MRI of the thoracic and lumbar spine as there is concern for spinal cord compression. Case was discussed with radiology. Will place MRI without contrast. Started on high-dose Decadron. Consults neurosurgery for further evaluation. N.p.o. pending MRI. Continue home pain regimen. Uptitrate as clinically indicated.  Prostate cancer: Baseline  metastatic disease. Continue pain control. Oncology and Rad-Onc is aware of admission. Oncology followup in a.m.  Diabetes: Sliding scale insulin. Hold oral medications. A1c.  FEN/GI: N.p.o. pending MRI and neurosurgery eval. PPI. Prophylaxis: Subcutaneous heparin Disposition: Pending further evaluation Code Status: Full code       Doree Albee MD  Pager: [phone]

## 2013-03-29 NOTE — Progress Notes (Signed)
Hematology and Oncology Follow Up Visit  [name] [id number] 02-21-1951 62 y.o. 03/29/2013 4:36 PM  CC:  Andrew Purpura, MD  [name], MD  [name], M.D.    Principle Diagnosis: This is a 62 year old gentleman with prostate cancer initially diagnosed in 2007.  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  Prior Therapy: 1. Status post laparoscopic prostatectomy and bilateral lymphadenectomy, postoperative PSA was 0.5. 2. He received salvage radiation therapy due to persistent elevation in his PSA. 3. Patient treated with Lupron and Casodex due to a rise in his PSA, subsequently treated with Casodex withdrawal. 4. 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 5. Patient treated with 10 cycles of Taxotere and prednisone.  Taxotere was reduced to 60 mg/sq m; last dose given was in October 2011.  However, PSA started to rise up to 250. 6. Zytiga 1000 mg daily beginning October 2011 through August 2013. 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 Zytiga was stopped due to a rising PSA up to 630.40 on 03/18/12. 7. Xtandi 160 mg daily started on 04/13/12. Stopped in January 2014. 8. Received XRT to his pelvis and T8 09/19/12 through 10/10/12.  Current therapy: He is on Jevtana 20 mg/m2. S/P 4 cycles. Treatment has been on hold for now.   Interim History:  [name] presents today for a followup visit. Since his last visit, he developed L5 tumor with epidural extension and resultant radiculopathy. He under went T6-T7 laminectomy and resection of epidural tumor Left L5 decompressive hemilaminectomy with resection of epidural tumor on 03/13/2013. He was recovering well today where is he noted a complete paralysis in his lower extremities. He was able to ambulate with a walker till  today where he is not able to do that anymore.  He was seen by Dr. Kathrynn Running and suggested an urgent MRI.  Patient report pain has been reasonably controlled and has been eating well.    Medications: I have reviewed the patient's current medications. Current Outpatient Prescriptions  Medication Sig Dispense Refill  . ACCU-CHEK AVIVA PLUS test strip USE AS DIRECTED  100 strip  3  . Calcium Citrate-Vitamin D (CITRACAL/VITAMIN D PO) Take 1 tablet by mouth daily. 1200 mg calcium and 1000 mg vitamin d      . dexamethasone (DECADRON) 4 MG tablet Take 0.5 tablets (2 mg total) by mouth 2 (two) times daily with a meal.  60 tablet  0  . doxazosin (CARDURA) 8 MG tablet Take 8 mg by mouth at bedtime.      . fentaNYL (DURAGESIC - DOSED MCG/HR) 25 MCG/HR patch Place 1 patch (25 mcg total) onto the skin every 3 (three) days. Pt put patch on Sunday. Pt takes a total of 75mg  dose  10 patch  0  . fentaNYL (DURAGESIC - DOSED MCG/HR) 50 MCG/HR Place 1 patch onto the skin every 3 (three) days. Pt put patch on Sunday. Pt takes a total of 75 mg dose      . hydrochlorothiazide (HYDRODIURIL) 25 MG tablet Take 25 mg by mouth daily.      . insulin aspart protamine-insulin aspart (NOVOLOG 70/30) (70-30) 100 UNIT/ML injection Inject 20 Units into the skin daily with supper.       Marland Kitchen leuprolide (LUPRON) 30 MG injection Inject 30 mg into the muscle every 4 (four) months.  Pt had 2 weeks ago      . lidocaine-prilocaine (EMLA) cream Apply 1 application topically daily as needed (APPLY TO PORTA-CATH SITE 30 MINS TO 2 HOURS PRIOR TO TREATMENT & COVER WITH PLASTIC WRAP).      Marland Kitchen lisinopril (PRINIVIL,ZESTRIL) 10 MG tablet Take 10 mg by mouth daily.      . metFORMIN (GLUCOPHAGE) 850 MG tablet Take 850 mg by mouth 2 (two) times daily with a meal.      . Omega-3 Fatty Acids (FISH OIL TRIPLE STRENGTH) 1400 MG CAPS Take 1,400 mg by mouth daily.      Marland Kitchen oxyCODONE-acetaminophen (PERCOCET) 10-325 MG per tablet Take 1-2 tablets by mouth every 4  (four) hours as needed for pain.  80 tablet  0  . senna (SENOKOT) 8.6 MG TABS tablet Take 1 tablet by mouth.       No current facility-administered medications for this visit.   Facility-Administered Medications Ordered in Other Visits  Medication Dose Route Frequency Provider Last Rate Last Dose  . heparin lock flush 100 unit/mL  500 Units Intravenous Once [name], MD      . sodium chloride 0.9 % injection 10 mL  10 mL Intravenous PRN [name], MD        Allergies:  Allergies  Allergen Reactions  . Aprindine   . Bc Powder [Aspirin-Salicylamide-Caffeine] Hives  . Penicillins Hives    Past Medical History, Surgical history, Social history, and Family History were reviewed and updated.  Review of Systems:  Remaining ROS negative.  Physical Exam: There were no vitals taken for this visit. ECOG: 1 General appearance: alert Head: Normocephalic, without obvious abnormality, atraumatic Neck: no adenopathy, no carotid bruit, no JVD, supple, symmetrical, trachea midline and thyroid not enlarged, symmetric, no tenderness/mass/nodules Lymph nodes: Cervical, supraclavicular, and axillary nodes normal. Heart:regular rate and rhythm, S1, S2 normal, no murmur, click, rub or gallop Lung:chest clear, no wheezing, rales, normal symmetric air entry Abdomen: soft, non-tender, without masses or organomegaly EXT:no erythema, induration, or nodules. 1+ edema bilaterally. Decrease sensation and motor strength.     Lab Results: Lab Results  Component Value Date   WBC 4.0 03/13/2013   HGB 8.5* 03/14/2013   HCT 25.0* 03/14/2013   MCV 78.7 03/13/2013   PLT 282 03/13/2013     Chemistry      Component Value Date/Time   NA 138 03/14/2013 0442   NA 138 02/28/2013 1216   NA 141 08/13/2011 1129   K 4.2 03/14/2013 0442   K 3.7 02/28/2013 1216   K 3.7 08/13/2011 1129   CL 95* 03/13/2013 1358   CL 100 01/17/2013 1102   CL 103 08/13/2011 1129   CO2 24 03/13/2013 1358   CO2 26 02/28/2013 1216   CO2 28  08/13/2011 1129   BUN 11 03/13/2013 1358   BUN 11.4 02/28/2013 1216   BUN 15 08/13/2011 1129   CREATININE 0.56 03/13/2013 1358   CREATININE 0.7 02/28/2013 1216   CREATININE 0.5* 08/13/2011 1129      Component Value Date/Time   CALCIUM 10.2 03/13/2013 1358   CALCIUM 10.5* 02/28/2013 1216   CALCIUM 9.3 08/13/2011 1129   ALKPHOS 80 03/13/2013 1358   ALKPHOS 90 02/28/2013 1216   ALKPHOS 76 08/13/2011 1129   AST 26 03/13/2013 1358   AST 23 02/28/2013 1216   AST 12 08/13/2011 1129   ALT 7 03/13/2013 1358   ALT 7 02/28/2013 1216   ALT 19 08/13/2011 1129   BILITOT 0.4 03/13/2013  1358   BILITOT 0.49 02/28/2013 1216   BILITOT 0.50 08/13/2011 1129      Impression and Plan:  This is a 62 year old gentleman with the following issues: 1. 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  Recently has been on  Xtandi, but stopped due to disease progression. CT scan from 08/2012 showed disease progression with a large pelvic mass. He is s/p XRT to this mass. He is now on Jevtana with a reduced dose (20 mg/m2).  This has been on hold for now. It is possible that he has progressed on this current treatment.  2. Port-A-Cath management. Flushed today.  3. Avascular necrosis. Right hip surgery is on hold at this time.  4. Anemia that is multifactorial. Counts are better today.  5. Androgen deprivation.  Receives Lupron every four months.  6. Recent L5 tumor with epidural extension and resultant radiculopathy.He under went T6-T7 laminectomy and resection of epidural tumor Left L5 decompressive hemilaminectomy with resection of epidural tumor on 03/13/2013. After a brief recovery and was able to ambulate with a walker. Today, he is noticing acute onset of paralysis with a possible a new lesion. I discussed these findings with Dr. Kathrynn Running and the patient today. We felt given his inability to ambulate, further workup should be done in patient. I have contacted the hospitalist service  and I gave the admitting physician report regarding Mr. Massi condition and the need for an urgent MRI and also the start of intravenous steroids. I will follow his progress while he is in the hospital. Depending on the results of the MRI which I hope will be done soon, Dr. Kathrynn Running will decide on the best approach for his radiation therapy.   Andrew Nelson 8/20/20144:36 PM

## 2013-03-29 NOTE — Progress Notes (Signed)
Radiation Oncology         [phone]) [phone] ________________________________  Name: Andrew Nelson MRN: [id number]  Date: 03/29/2013  DOB: April 01, 1951  Follow-Up Visit Note  CC: Andrew Nelson Andrew Busman, MD  Andrew Nelson, Andrew Maser, MD  Diagnosis:   62 yo man with metastatic prostate cancer s/p radiotherapy:  1. Prostatic Fossa salvage radiation therapy due to PSA of 0.65 in October-November 2007 to 68.4 Gy  2. T7-T9 was treated to 35 Gy from 09/19/2012-10/10/2012 for prevention of spinal cord injury  3. The presacral nodal mass was treated to 35 Gy from 09/19/2012-10/10/2012 for Palliation of pain  Interval Since Last Radiation:  5  months  Narrative:   Andrew Nelson is a 62 year old male with history of metastatic prostate carcinoma well known to me from previous courses of radiotherapy.  He recently presented with worsening back pain symptoms and weakness and sensory loss.  Workup demonstrated evidence of multiple areas of epidural tumor extension including T4 on the right which did not cause significant spinal cord compression T6-7 dorsolaterally on the left which did cause marked spinal cord compression T12 on the right with minimal thecal sac compression and L5 on the left with marked L5 nerve root compression. Dr. Jordan Nelson performed laminectomy and resection of his epidural tumor at both T6-7 and L5 on 03/14/2013.  He presents today to coordinate adjuvant radiotherapy for prevention of spinal cord injury.  Notably, the patient was ambulatory prior to his laminectomy and postoperatively until 12 hours ago.  He has experienced increasing leg weakness over the past few days and was ultimately unable to stand and walk earlier today. He denies any lower chart he weakness or numbness.  ALLERGIES:  is allergic to aprindine; bc powder; and penicillins.  Meds: No current facility-administered medications for this encounter.   No current outpatient prescriptions on file.   Facility-Administered Medications Ordered in  Other Encounters  Medication Dose Route Frequency Provider Last Rate Last Dose  . 0.9 %  sodium chloride infusion   Intravenous Continuous Andrew Albee, MD      . Calcium Citrate-Vitamin D 250-200 MG-UNIT TABS 1 tablet  1 tablet Oral Daily Andrew Albee, MD      . dexamethasone (DECADRON) injection 10 mg  10 mg Intravenous Q12H Andrew Albee, MD      . doxazosin (CARDURA) tablet 8 mg  8 mg Oral QHS Andrew Albee, MD      . fentaNYL (DURAGESIC - dosed mcg/hr) 50 mcg  50 mcg Transdermal Q72H Andrew Albee, MD      . fentaNYL (DURAGESIC - dosed mcg/hr) patch 25 mcg  25 mcg Transdermal Q72H Andrew Albee, MD      . heparin injection 5,000 Units  5,000 Units Subcutaneous Q8H Andrew Albee, MD      . heparin lock flush 100 unit/mL  500 Units Intravenous Once Andrew Core, MD      . hydrochlorothiazide (HYDRODIURIL) tablet 25 mg  25 mg Oral Daily Andrew Albee, MD      . insulin aspart (novoLOG) injection 0-15 Units  0-15 Units Subcutaneous Q4H Andrew Albee, MD      . insulin aspart protamine- aspart (NOVOLOG MIX 70/30) injection 20 Units  20 Units Subcutaneous Q supper Andrew Albee, MD      . leuprolide (LUPRON) injection 30 mg  30 mg Intramuscular Q4 months Andrew Albee, MD      . lidocaine-prilocaine (EMLA) cream 1 application  1 application Topical Daily PRN Andrew Albee, MD      . lisinopril (  PRINIVIL,ZESTRIL) tablet 10 mg  10 mg Oral Daily Andrew Albee, MD      . omega-3 acid ethyl esters (LOVAZA) capsule 1 g  1 g Oral Daily Andrew Albee, MD      . senna [facility]) tablet 8.6 mg  1 tablet Oral Daily Andrew Albee, MD      . sodium chloride 0.9 % injection 10 mL  10 mL Intravenous PRN Andrew Core, MD        Physical Findings: The patient is in no acute distress. Patient is alert and oriented.  height is 5\' 7"  (1.702 m) and weight is 174 lb (78.926 kg). His oral temperature is 99 F (37.2 C). His blood pressure is 138/65 and his pulse is 104. His respiration is 16 and oxygen  saturation is 100%. .  Neurologic exam demonstrates fluent speech. Motor strength is 5/5 in the upper chart is. Motor strength is 2/5 in the hip flexors and 4/5 in the other lower chart he muscle groups.  Light touch is intact throughout with no notable nerve level. No significant changes.  Lab Findings: Lab Results  Component Value Date   WBC 14.0* 03/29/2013   HGB 12.3* 03/29/2013   HCT 38.2* 03/29/2013   MCV 81.6 03/29/2013   PLT Clumped Platelets--Appears Adequate 03/29/2013    @LASTCHEM @  Radiographic Findings: Dg Chest 2 View  03/13/2013   *RADIOLOGY REPORT*  Clinical Data: Back pain, chest pain.  CHEST - 2 VIEW  Comparison: 02/23/2012  Findings: Right Port-A-Cath remains in place, unchanged.  Linear atelectasis in the lung bases.  Heart is borderline in size.  Small bilateral pleural effusions.  Vague opacity in the left mid lung.  Cannot exclude pulmonary nodule or pleural based nodule.  Consider further evaluation with chest CT.  IMPRESSION: Bibasilar atelectasis.  Small effusions.  Questionable pulmonary or pleural nodule in the left mid lung peripherally.  Recommend further evaluation with chest CT.   Original Report Authenticated By: Andrew Nelson, M.D.   Ct Angio Chest Pe W/cm &/or Wo Cm  03/13/2013   *RADIOLOGY REPORT*  Clinical Data: Chest pain with tachycardia and shortness of breath. Question pulmonary embolism.  History of prostate cancer.  CT ANGIOGRAPHY CHEST  Technique:  Multidetector CT imaging of the chest using the standard protocol during bolus administration of intravenous contrast. Multiplanar reconstructed images including MIPs were obtained and reviewed to evaluate the vascular anatomy.  Contrast: 60mL OMNIPAQUE IOHEXOL 350 MG/ML SOLN  Comparison: Chest CT 08/08/2012.  Findings: The pulmonary arteries appear satisfactorily opacified with contrast.  There is no evidence of acute pulmonary embolism. There is stable relatively mild atherosclerosis of the aorta, great vessels and  coronary arteries.  A right IJ Port-A-Cath extends to the superior aspect of the right atrium.  There has been interval development of small bilateral pleural effusions as well as a small to moderate pericardial effusion. Small precarinal and subcarinal lymph nodes are unchanged.  There is linear atelectasis at both lung bases.  No lung mass or endobronchial lesion is identified.  However, there is a pleural- based mass involving the left chest wall anteriorly adjacent to the fourth rib.  There are multiple progressive lytic lesions throughout the spine and ribs consistent with metastatic disease. There are new superior endplate compression deformities at T5 and T6 which may be pathologic. There is suspicion of posterior epidural tumor on the left at T6-T7 (axial image 31 and sagittal image 77).  Images through the upper abdomen demonstrate multiple enlarging low density liver lesions  highly suspicious for metastatic disease.  IMPRESSION:  1.  No evidence of acute pulmonary embolism. 2.  Strong concern of progressive widespread metastatic disease to the bones, pleura and liver.  There are possible pathologic fractures at T5 and T6 with suspicion of posterior epidural tumor on the left at T6-T7, potentially causing cord compression. Thoracic MRI suggested for further evaluation. 3.  New bilateral pleural effusions and small to moderate pericardial effusion.  Critical Value/emergent results were called by telephone at the time of interpretation on 03/13/2013 at 1830 hours to Dr. Bernette Mayers, who verbally acknowledged these results.   Original Report Authenticated By: Carey Bullocks, M.D.   Mr Thoracic Spine Wo Contrast  03/13/2013   *RADIOLOGY REPORT*  Clinical Data:  Prostate cancer, evaluate epidural tumor  MRI THORACIC SPINE WITHOUT CONTRAST LUMBAR SPINE WITHOUT AND WITH CONTRAST  Technique:  Multiplanar and multiecho pulse sequences of the thoracic and lumbar spine were obtained without and with intravenous  contrast.  Contrast: 16mL MULTIHANCE GADOBENATE DIMEGLUMINE 529 MG/ML IV SOLN  Comparison:   Prior CT performed earlier on the same date.  MRI THORACIC SPINE  Findings: The vertebral bodies are normally aligned with preservation of the normal thoracic kyphosis. Bone marrow signal intensity is markedly abnormal with diffuse osseous metastatic disease seen throughout the thoracic spine, essentially involving all levels. Postradiation changes are seen within the T7, T8, and T9 vertebral bodies.  There is mild vertebral body height loss at the T6 level which appears chronic in nature.  Overall, there is approximately 20% of height loss at this level.  Otherwise, vertebral bodies are preserved.  At the T4-5 level, there is epidural tumor within the right lateral aspect of the thecal sac extending into the right T4-5 neural foramen. There is resultant moderate right neural foraminal stenosis without significant central canal stenosis.  Tumor is seen involving several right sided ribs.  Slightly inferiorly at the T5 level, there is abnormal convex contour of the T5 vertebral body with classic curtain deformity of the ventral epidural space, consistent with epidural tumor (series 8, image 21).  There is resultant moderate canal stenosis with slight displacement of the spinal cord dorsally at this level.  No cord signal changes are seen.  Epidural tumor at this level extends into the left T5 foramen and along the right lateral aspect of T5 anteriorly, and along the right posterior fifth rib posteriorly and laterally (series 8, image 21). There is tumor extension into the posterior elements at this level, involving the bilateral pedicles and T4-5 facet joints, right greater than left.  There is secondary moderate right foraminal stenosis at T5-6.  Prominent tumor present within the left posterior elements of T7 is visualized, with extension into the left posterolateral epidural space (series 8, image 26).  The spinal cord is  displaced anteriorly and to the right with secondary severe central canal stenosis. There is no associated cord signal changes.  Small amount of epidural tumor with cord deformity is seen inferiorly at the level of T10 (series 8, image 42).  No significant foraminal stenosis.  There is mild central canal narrowing.  There is moderate left neural foraminal stenosis of the T11-12 left neural foramen due to particular enlargement from osseous metastatic disease.  MRI LUMBAR SPINE  Findings: The vertebral bodies are normally aligned with preservation of the normal lumbar lordosis.  Diffusely abnormal marrow signal intensity with innumerable osseous metastasis are again seen throughout the lumbar spine as well.  At the level of L1, there is epidural  tumor within the right lateral epidural space abutting the right aspect of the spinal cord and resulting in mild canal stenosis.  There is tumor extension into the right neural foramen at L1-2 with nerve compression. Metastatic enlargement of the bilateral pedicles of L1 also contributes foraminal narrowing.  Inferiorly at the level of L3 - 4, there is degenerative disc bulge with a superimposed right foraminal disc protrusion which results and moderate right foraminal stenosis.  At the level of L4, there is abnormal epidural tumor within the left ventral aspect of the thecal sac with extension into the left lateral recess and left neural foramen.  There is secondary severe listhesis of the left L4-5 neural foramen. There is moderate canal stenosis at this level.  At the level of L5, a large abnormal epidural soft tissue masses present within the left ventral thecal sac which measures 1.8 x 1.5 x 2.2 cm in this compresses the left lateral recess and results in severe canal and left neural foraminal stenosis at L4-5.  The described epidural tumor enhances with post contrast images.  Innumerable osseous metastases are also seen within the sacrum and bilateral iliac wings.   Bilateral pleural effusions are present, right greater than left.  IMPRESSION: 1. Widespread osseous metastases throughout the thoracic and lumbar spine.  2. 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  3.  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  No cord signal changes.  4.  Epidural tumor within the left posterolateral epidural space at T6-T7 with secondary severe central canal stenosis and dorsal displacement of the spinal cord.  No cord signal changes.  5.  Soft tissue mass within the left ventral epidural space at the L5 with secondary severe canal and left neural foraminal stenosis at L4-5.  6. Epidural tumor within the right lateral epidural space at L1 with secondary mild canal and right neural foraminal stenosis.  7.  Bilateral pleural effusions, right greater than left.  Critical Value/emergent results were called by telephone at the time of interpretation on 03/13/2013 at 11:21 pm to Dr. Susy Frizzle, who verbally acknowledged these results.   Original Report Authenticated By: Rise Mu, M.D.   Mr Lumbar Spine W Wo Contrast  03/14/2013   *RADIOLOGY REPORT*  Clinical Data:  Prostate cancer, evaluate epidural tumor  MRI THORACIC SPINE WITHOUT CONTRAST LUMBAR SPINE WITHOUT AND WITH CONTRAST  Technique:  Multiplanar and multiecho pulse sequences of the thoracic and lumbar spine were obtained without and with intravenous contrast.  Contrast: 16mL MULTIHANCE GADOBENATE DIMEGLUMINE 529 MG/ML IV SOLN  Comparison:   Prior CT performed earlier on the same date.  MRI THORACIC SPINE  Findings: The vertebral bodies are normally aligned with preservation of the normal thoracic kyphosis. Bone marrow signal intensity is markedly abnormal with diffuse osseous metastatic disease seen throughout the thoracic spine, essentially involving all levels. Postradiation changes are seen  within the T7, T8, and T9 vertebral bodies.  There is mild vertebral body height loss at the T6 level which appears chronic in nature.  Overall, there is approximately 20% of height loss at this level.  Otherwise, vertebral bodies are preserved.  At the T4-5 level, there is epidural tumor within the right lateral aspect of the thecal sac extending into the right T4-5 neural foramen. There is resultant moderate right neural foraminal stenosis without significant central canal stenosis.  Tumor is seen involving several right sided  ribs.  Slightly inferiorly at the T5 level, there is abnormal convex contour of the T5 vertebral body with classic curtain deformity of the ventral epidural space, consistent with epidural tumor (series 8, image 21).  There is resultant moderate canal stenosis with slight displacement of the spinal cord dorsally at this level.  No cord signal changes are seen.  Epidural tumor at this level extends into the left T5 foramen and along the right lateral aspect of T5 anteriorly, and along the right posterior fifth rib posteriorly and laterally (series 8, image 21). There is tumor extension into the posterior elements at this level, involving the bilateral pedicles and T4-5 facet joints, right greater than left.  There is secondary moderate right foraminal stenosis at T5-6.  Prominent tumor present within the left posterior elements of T7 is visualized, with extension into the left posterolateral epidural space (series 8, image 26).  The spinal cord is displaced anteriorly and to the right with secondary severe central canal stenosis. There is no associated cord signal changes.  Small amount of epidural tumor with cord deformity is seen inferiorly at the level of T10 (series 8, image 42).  No significant foraminal stenosis.  There is mild central canal narrowing.  There is moderate left neural foraminal stenosis of the T11-12 left neural foramen due to particular enlargement from osseous  metastatic disease.  MRI LUMBAR SPINE  Findings: The vertebral bodies are normally aligned with preservation of the normal lumbar lordosis.  Diffusely abnormal marrow signal intensity with innumerable osseous metastasis are again seen throughout the lumbar spine as well.  At the level of L1, there is epidural tumor within the right lateral epidural space abutting the right aspect of the spinal cord and resulting in mild canal stenosis.  There is tumor extension into the right neural foramen at L1-2 with nerve compression. Metastatic enlargement of the bilateral pedicles of L1 also contributes foraminal narrowing.  Inferiorly at the level of L3 - 4, there is degenerative disc bulge with a superimposed right foraminal disc protrusion which results and moderate right foraminal stenosis.  At the level of L4, there is abnormal epidural tumor within the left ventral aspect of the thecal sac with extension into the left lateral recess and left neural foramen.  There is secondary severe listhesis of the left L4-5 neural foramen. There is moderate canal stenosis at this level.  At the level of L5, a large abnormal epidural soft tissue masses present within the left ventral thecal sac which measures 1.8 x 1.5 x 2.2 cm in this compresses the left lateral recess and results in severe canal and left neural foraminal stenosis at L4-5.  The described epidural tumor enhances with post contrast images.  Innumerable osseous metastases are also seen within the sacrum and bilateral iliac wings.  Bilateral pleural effusions are present, right greater than left.  IMPRESSION: 1. Widespread osseous metastases throughout the thoracic and lumbar spine.  2. 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  3.  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  No cord signal changes.  4.  Epidural tumor within the left  posterolateral epidural space at T6-T7 with secondary severe central canal stenosis and dorsal displacement of the spinal cord.  No cord signal changes.  5.  Soft tissue mass within the left ventral epidural space at the L5 with secondary severe canal and left neural foraminal stenosis at  L4-5.  6. Epidural tumor within the right lateral epidural space at L1 with secondary mild canal and right neural foraminal stenosis.  7.  Bilateral pleural effusions, right greater than left.  Critical Value/emergent results were called by telephone at the time of interpretation on 03/13/2013 at 11:21 pm to Dr. Susy Frizzle, who verbally acknowledged these results.   Original Report Authenticated By: Rise Mu, M.D.    Impression:  The patient underwent recent laminectomy for spinal cord compression in the thoracic spine and L5. The site of involvement in the mid thoracic spine has previously received palliative radiotherapy. In that area, reirradiation of the spinal cord would carry some significant risk for spinal cord injury. Accordingly, patient would be well-suited for stereotactic radiosurgery at the T6-7 level. He also benefit from radiotherapy to the L5 level which has not received previous palliative radiation.  However, the patient's recent lower extremity weakness is worrisome for recurrent cord compression.   Plan:  The patient is scheduled to meet with medical oncology in the next few minutes. I will discuss the case with medical oncology and recommend consideration for hospital admission due to his new inability to walk and urgent spinal MRI. Will followup on the results of that and act accordingly.  _____________________________________  [name], M.D.

## 2013-03-30 ENCOUNTER — Encounter: Payer: BC Managed Care – PPO | Admitting: Internal Medicine

## 2013-03-30 ENCOUNTER — Inpatient Hospital Stay (HOSPITAL_COMMUNITY): Payer: BC Managed Care – PPO

## 2013-03-30 ENCOUNTER — Inpatient Hospital Stay (HOSPITAL_COMMUNITY): Payer: BC Managed Care – PPO | Admitting: *Deleted

## 2013-03-30 ENCOUNTER — Encounter (HOSPITAL_COMMUNITY)
Admission: AD | Disposition: A | Discharge status: Rehabilitation Facility | Payer: Self-pay | Source: Ambulatory Visit | Attending: Neurosurgery

## 2013-03-30 ENCOUNTER — Encounter (HOSPITAL_COMMUNITY): Payer: Self-pay | Admitting: *Deleted

## 2013-03-30 DIAGNOSIS — C61 Malignant neoplasm of prostate: Secondary | ICD-10-CM

## 2013-03-30 DIAGNOSIS — E871 Hypo-osmolality and hyponatremia: Secondary | ICD-10-CM

## 2013-03-30 DIAGNOSIS — R29818 Other symptoms and signs involving the nervous system: Secondary | ICD-10-CM

## 2013-03-30 DIAGNOSIS — D638 Anemia in other chronic diseases classified elsewhere: Secondary | ICD-10-CM

## 2013-03-30 DIAGNOSIS — R29898 Other symptoms and signs involving the musculoskeletal system: Secondary | ICD-10-CM | POA: Diagnosis present

## 2013-03-30 DIAGNOSIS — I10 Essential (primary) hypertension: Secondary | ICD-10-CM

## 2013-03-30 DIAGNOSIS — C7951 Secondary malignant neoplasm of bone: Secondary | ICD-10-CM

## 2013-03-30 HISTORY — PX: LAMINECTOMY: SHX219

## 2013-03-30 LAB — HEMOGLOBIN A1C: Hgb A1c MFr Bld: 6 % — ABNORMAL HIGH (ref <5.7)

## 2013-03-30 LAB — CBC WITH DIFFERENTIAL/PLATELET
Basophils Absolute: 0 10*3/uL (ref 0.0–0.1)
Basophils Relative: 0 % (ref 0–1)
Eosinophils Absolute: 0 10*3/uL (ref 0.0–0.7)
MCH: 26.8 pg (ref 26.0–34.0)
MCHC: 32.8 g/dL (ref 30.0–36.0)
Neutro Abs: 9 10*3/uL — ABNORMAL HIGH (ref 1.7–7.7)
Neutrophils Relative %: 90 % — ABNORMAL HIGH (ref 43–77)
Platelets: 156 10*3/uL (ref 150–400)
RDW: 18.4 % — ABNORMAL HIGH (ref 11.5–15.5)

## 2013-03-30 LAB — GLUCOSE, CAPILLARY
Glucose-Capillary: 141 mg/dL — ABNORMAL HIGH (ref 70–99)
Glucose-Capillary: 115 mg/dL — ABNORMAL HIGH (ref 70–99)
Glucose-Capillary: 109 mg/dL — ABNORMAL HIGH (ref 70–99)
Glucose-Capillary: 143 mg/dL — ABNORMAL HIGH (ref 70–99)

## 2013-03-30 LAB — COMPREHENSIVE METABOLIC PANEL
ALT: 19 U/L (ref 0–53)
AST: 36 U/L (ref 0–37)
Albumin: 2.8 g/dL — ABNORMAL LOW (ref 3.5–5.2)
Alkaline Phosphatase: 120 U/L — ABNORMAL HIGH (ref 39–117)
BUN: 19 mg/dL (ref 6–23)
Chloride: 99 mEq/L (ref 96–112)
Potassium: 3.9 mEq/L (ref 3.5–5.1)
Sodium: 134 mEq/L — ABNORMAL LOW (ref 135–145)
Total Protein: 6.1 g/dL (ref 6.0–8.3)

## 2013-03-30 SURGERY — THORACIC LAMINECTOMY FOR TUMOR
Anesthesia: General | Site: Back | Laterality: Bilateral | Wound class: Clean

## 2013-03-30 MED ORDER — MENTHOL 3 MG MT LOZG: 1.0000 | LOZENGE | OROMUCOSAL | Status: DC | PRN

## 2013-03-30 MED ORDER — SODIUM CHLORIDE 0.9 % IJ SOLN: 3.0000 mL | INTRAMUSCULAR | Status: DC | PRN

## 2013-03-30 MED ORDER — SODIUM CHLORIDE 0.9 % IV SOLN: 250.0000 mL | INTRAVENOUS | Status: DC

## 2013-03-30 MED ORDER — DEXAMETHASONE 4 MG PO TABS
4.0000 mg | ORAL_TABLET | Freq: Four times a day (QID) | ORAL | Status: DC
  Administered 2013-04-01 – 2013-04-05 (×8): 4 mg via ORAL
  Filled 2013-03-30 (×27): qty 1

## 2013-03-30 MED ORDER — OXYCODONE HCL 5 MG PO TABS: 5.0000 mg | ORAL_TABLET | Freq: Once | ORAL | Status: DC | PRN

## 2013-03-30 MED ORDER — MEPERIDINE HCL 25 MG/ML IJ SOLN: 6.2500 mg | INTRAMUSCULAR | Status: DC | PRN

## 2013-03-30 MED ORDER — INSULIN ASPART 100 UNIT/ML ~~LOC~~ SOLN
0.0000 [IU] | Freq: Three times a day (TID) | SUBCUTANEOUS | Status: DC
  Administered 2013-03-31 – 2013-04-01 (×4): 3 [IU] via SUBCUTANEOUS
  Administered 2013-04-01 – 2013-04-02 (×3): 4 [IU] via SUBCUTANEOUS
  Administered 2013-04-02: 3 [IU] via SUBCUTANEOUS
  Administered 2013-04-02: 4 [IU] via SUBCUTANEOUS
  Administered 2013-04-03: 3 [IU] via SUBCUTANEOUS
  Administered 2013-04-03: 4 [IU] via SUBCUTANEOUS
  Administered 2013-04-04: 3 [IU] via SUBCUTANEOUS
  Administered 2013-04-04: 7 [IU] via SUBCUTANEOUS
  Administered 2013-04-04: 3 [IU] via SUBCUTANEOUS
  Administered 2013-04-05: 7 [IU] via SUBCUTANEOUS
  Administered 2013-04-05: 3 [IU] via SUBCUTANEOUS

## 2013-03-30 MED ORDER — HYDROMORPHONE HCL PF 1 MG/ML IJ SOLN
0.5000 mg | INTRAMUSCULAR | Status: DC | PRN
  Administered 2013-03-31 – 2013-04-03 (×5): 1 mg via INTRAVENOUS
  Administered 2013-04-04 – 2013-04-05 (×2): 0.5 mg via INTRAVENOUS
  Filled 2013-03-30 (×7): qty 1

## 2013-03-30 MED ORDER — PROMETHAZINE HCL 25 MG/ML IJ SOLN: 6.2500 mg | INTRAMUSCULAR | Status: DC | PRN

## 2013-03-30 MED ORDER — KETOROLAC TROMETHAMINE 30 MG/ML IJ SOLN
30.0000 mg | Freq: Four times a day (QID) | INTRAMUSCULAR | Status: AC
  Administered 2013-03-31 – 2013-04-04 (×20): 30 mg via INTRAVENOUS
  Filled 2013-03-30 (×23): qty 1

## 2013-03-30 MED ORDER — ONDANSETRON HCL 4 MG/2ML IJ SOLN: 4.0000 mg | Freq: Once | INTRAMUSCULAR | Status: DC | PRN

## 2013-03-30 MED ORDER — HYDROCODONE-ACETAMINOPHEN 5-325 MG PO TABS
1.0000 | ORAL_TABLET | ORAL | Status: DC | PRN
  Administered 2013-04-02 – 2013-04-05 (×2): 2 via ORAL
  Filled 2013-03-30 (×4): qty 2

## 2013-03-30 MED ORDER — ACETAMINOPHEN 650 MG RE SUPP: 650.0000 mg | RECTAL | Status: DC | PRN

## 2013-03-30 MED ORDER — VANCOMYCIN HCL IN DEXTROSE 1-5 GM/200ML-% IV SOLN: 1000.0000 mg | INTRAVENOUS | Status: DC

## 2013-03-30 MED ORDER — DIAZEPAM 5 MG PO TABS
5.0000 mg | ORAL_TABLET | Freq: Four times a day (QID) | ORAL | Status: DC | PRN
  Administered 2013-03-30: 5 mg via ORAL
  Filled 2013-03-30: qty 1

## 2013-03-30 MED ORDER — VANCOMYCIN HCL IN DEXTROSE 1-5 GM/200ML-% IV SOLN
INTRAVENOUS | Status: AC
  Administered 2013-03-30: 1000 mg via INTRAVENOUS
  Filled 2013-03-30: qty 200

## 2013-03-30 MED ORDER — ACETAMINOPHEN 325 MG PO TABS: 650.0000 mg | ORAL_TABLET | ORAL | Status: DC | PRN

## 2013-03-30 MED ORDER — MIDAZOLAM HCL 2 MG/2ML IJ SOLN: 0.5000 mg | Freq: Once | INTRAMUSCULAR | Status: DC | PRN

## 2013-03-30 MED ORDER — PROPOFOL 10 MG/ML IV BOLUS
INTRAVENOUS | Status: DC | PRN
  Administered 2013-03-30: 140 mg via INTRAVENOUS

## 2013-03-30 MED ORDER — PANTOPRAZOLE SODIUM 40 MG IV SOLR
40.0000 mg | Freq: Every day | INTRAVENOUS | Status: DC
  Administered 2013-03-30 – 2013-04-01 (×3): 40 mg via INTRAVENOUS
  Filled 2013-03-30 (×4): qty 40

## 2013-03-30 MED ORDER — ROCURONIUM BROMIDE 100 MG/10ML IV SOLN
INTRAVENOUS | Status: DC | PRN
  Administered 2013-03-30: 50 mg via INTRAVENOUS

## 2013-03-30 MED ORDER — OXYCODONE HCL 5 MG/5ML PO SOLN: 5.0000 mg | Freq: Once | ORAL | Status: DC | PRN

## 2013-03-30 MED ORDER — NEOSTIGMINE METHYLSULFATE 1 MG/ML IJ SOLN
INTRAMUSCULAR | Status: DC | PRN
  Administered 2013-03-30: 5 mg via INTRAVENOUS

## 2013-03-30 MED ORDER — ONDANSETRON HCL 4 MG/2ML IJ SOLN: 4.0000 mg | INTRAMUSCULAR | Status: DC | PRN

## 2013-03-30 MED ORDER — HYDROMORPHONE HCL PF 1 MG/ML IJ SOLN
INTRAMUSCULAR | Status: AC
  Filled 2013-03-30: qty 1

## 2013-03-30 MED ORDER — FLEET ENEMA 7-19 GM/118ML RE ENEM
1.0000 | ENEMA | Freq: Once | RECTAL | Status: AC | PRN
  Filled 2013-03-30: qty 1

## 2013-03-30 MED ORDER — GLYCOPYRROLATE 0.2 MG/ML IJ SOLN
INTRAMUSCULAR | Status: DC | PRN
  Administered 2013-03-30: 0.6 mg via INTRAVENOUS

## 2013-03-30 MED ORDER — 0.9 % SODIUM CHLORIDE (POUR BTL) OPTIME
TOPICAL | Status: DC | PRN
  Administered 2013-03-30: 1000 mL

## 2013-03-30 MED ORDER — DEXAMETHASONE SODIUM PHOSPHATE 4 MG/ML IJ SOLN
4.0000 mg | Freq: Four times a day (QID) | INTRAMUSCULAR | Status: DC
  Administered 2013-03-31 – 2013-04-05 (×14): 4 mg via INTRAVENOUS
  Filled 2013-03-30 (×25): qty 1

## 2013-03-30 MED ORDER — THROMBIN 20000 UNITS EX SOLR
CUTANEOUS | Status: DC | PRN
  Administered 2013-03-30: 19:00:00 via TOPICAL

## 2013-03-30 MED ORDER — LIDOCAINE HCL (CARDIAC) 20 MG/ML IV SOLN
INTRAVENOUS | Status: DC | PRN
  Administered 2013-03-30: 100 mg via INTRAVENOUS

## 2013-03-30 MED ORDER — ALUM & MAG HYDROXIDE-SIMETH 200-200-20 MG/5ML PO SUSP: 30.0000 mL | Freq: Four times a day (QID) | ORAL | Status: DC | PRN

## 2013-03-30 MED ORDER — BISACODYL 10 MG RE SUPP: 10.0000 mg | Freq: Every day | RECTAL | Status: DC | PRN

## 2013-03-30 MED ORDER — MUPIROCIN 2 % EX OINT
TOPICAL_OINTMENT | Freq: Two times a day (BID) | CUTANEOUS | Status: DC
  Administered 2013-03-30 – 2013-04-02 (×4): via NASAL
  Filled 2013-03-30 (×4): qty 22

## 2013-03-30 MED ORDER — OXYCODONE-ACETAMINOPHEN 5-325 MG PO TABS
1.0000 | ORAL_TABLET | ORAL | Status: DC | PRN
  Administered 2013-03-30 – 2013-04-05 (×17): 2 via ORAL
  Filled 2013-03-30 (×17): qty 2

## 2013-03-30 MED ORDER — HYDROMORPHONE HCL PF 1 MG/ML IJ SOLN: 0.2500 mg | INTRAMUSCULAR | Status: DC | PRN

## 2013-03-30 MED ORDER — SENNA 8.6 MG PO TABS
1.0000 | ORAL_TABLET | Freq: Two times a day (BID) | ORAL | Status: DC
  Administered 2013-03-31 – 2013-04-05 (×11): 8.6 mg via ORAL
  Filled 2013-03-30 (×14): qty 1

## 2013-03-30 MED ORDER — MIDAZOLAM HCL 5 MG/5ML IJ SOLN
INTRAMUSCULAR | Status: DC | PRN
  Administered 2013-03-30: 2 mg via INTRAVENOUS

## 2013-03-30 MED ORDER — INSULIN ASPART 100 UNIT/ML ~~LOC~~ SOLN: 0.0000 [IU] | Freq: Three times a day (TID) | SUBCUTANEOUS | Status: DC

## 2013-03-30 MED ORDER — VANCOMYCIN HCL IN DEXTROSE 1-5 GM/200ML-% IV SOLN
1000.0000 mg | Freq: Two times a day (BID) | INTRAVENOUS | Status: DC
  Administered 2013-03-30 – 2013-04-02 (×6): 1000 mg via INTRAVENOUS
  Filled 2013-03-30 (×7): qty 200

## 2013-03-30 MED ORDER — SODIUM CHLORIDE 0.9 % IJ SOLN
3.0000 mL | Freq: Two times a day (BID) | INTRAMUSCULAR | Status: DC
  Administered 2013-03-31 – 2013-04-02 (×5): 3 mL via INTRAVENOUS

## 2013-03-30 MED ORDER — LACTATED RINGERS IV SOLN
INTRAVENOUS | Status: DC | PRN
  Administered 2013-03-30: 18:00:00 via INTRAVENOUS

## 2013-03-30 MED ORDER — SODIUM CHLORIDE 0.9 % IR SOLN
Status: DC | PRN
  Administered 2013-03-30: 19:00:00

## 2013-03-30 MED ORDER — LACTATED RINGERS IV SOLN
INTRAVENOUS | Status: DC | PRN
  Administered 2013-03-30 (×2): via INTRAVENOUS

## 2013-03-30 MED ORDER — THROMBIN 5000 UNITS EX SOLR
OROMUCOSAL | Status: DC | PRN
  Administered 2013-03-30: 19:00:00 via TOPICAL

## 2013-03-30 MED ORDER — MORPHINE SULFATE 4 MG/ML IJ SOLN
INTRAMUSCULAR | Status: AC
  Filled 2013-03-30: qty 1

## 2013-03-30 MED ORDER — POLYETHYLENE GLYCOL 3350 17 G PO PACK
17.0000 g | PACK | Freq: Every day | ORAL | Status: DC | PRN
  Filled 2013-03-30: qty 1

## 2013-03-30 MED ORDER — PHENOL 1.4 % MT LIQD: 1.0000 | OROMUCOSAL | Status: DC | PRN

## 2013-03-30 MED ORDER — FENTANYL CITRATE 0.05 MG/ML IJ SOLN
INTRAMUSCULAR | Status: DC | PRN
  Administered 2013-03-30: 100 ug via INTRAVENOUS
  Administered 2013-03-30 (×2): 50 ug via INTRAVENOUS
  Administered 2013-03-30: 100 ug via INTRAVENOUS
  Administered 2013-03-30: 50 ug via INTRAVENOUS

## 2013-03-30 MED ORDER — HYDROMORPHONE HCL PF 1 MG/ML IJ SOLN
0.2500 mg | INTRAMUSCULAR | Status: DC | PRN
  Administered 2013-03-30 (×2): 0.5 mg via INTRAVENOUS

## 2013-03-30 MED ORDER — ONDANSETRON HCL 4 MG/2ML IJ SOLN
INTRAMUSCULAR | Status: DC | PRN
  Administered 2013-03-30: 4 mg via INTRAVENOUS

## 2013-03-30 SURGICAL SUPPLY — 70 items
ADH SKN CLS APL DERMABOND .7 (GAUZE/BANDAGES/DRESSINGS) ×1
APL SKNCLS STERI-STRIP NONHPOA (GAUZE/BANDAGES/DRESSINGS) ×1
BAG DECANTER FOR FLEXI CONT (MISCELLANEOUS) ×2 IMPLANT
BENZOIN TINCTURE PRP APPL 2/3 (GAUZE/BANDAGES/DRESSINGS) ×2 IMPLANT
BLADE SURG 11 STRL SS (BLADE) IMPLANT
BLADE SURG ROTATE 9660 (MISCELLANEOUS) IMPLANT
BRUSH SCRUB EZ PLAIN DRY (MISCELLANEOUS) ×2 IMPLANT
BUR CUTTER 7.0 ROUND (BURR) ×2 IMPLANT
BUR MATCHSTICK NEURO 3.0 LAGG (BURR) ×2 IMPLANT
CANISTER SUCTION 2500CC (MISCELLANEOUS) ×2 IMPLANT
CLOTH BEACON ORANGE TIMEOUT ST (SAFETY) ×2 IMPLANT
CONT SPEC 4OZ CLIKSEAL STRL BL (MISCELLANEOUS) ×2 IMPLANT
DERMABOND ADVANCED (GAUZE/BANDAGES/DRESSINGS) ×1
DERMABOND ADVANCED .7 DNX12 (GAUZE/BANDAGES/DRESSINGS) ×1 IMPLANT
DRAPE LAPAROTOMY 100X72X124 (DRAPES) ×2 IMPLANT
DRAPE LAPAROTOMY T 102X78X121 (DRAPES) IMPLANT
DRAPE MICROSCOPE LEICA (MISCELLANEOUS) ×2 IMPLANT
DRAPE MICROSCOPE ZEISS OPMI (DRAPES) IMPLANT
DRAPE POUCH INSTRU U-SHP 10X18 (DRAPES) ×2 IMPLANT
DRAPE SURG 17X23 STRL (DRAPES) ×4 IMPLANT
ELECT REM PT RETURN 9FT ADLT (ELECTROSURGICAL) ×2
ELECTRODE REM PT RTRN 9FT ADLT (ELECTROSURGICAL) ×1 IMPLANT
EVACUATOR 1/8 PVC DRAIN (DRAIN) ×2 IMPLANT
GAUZE SPONGE 4X4 16PLY XRAY LF (GAUZE/BANDAGES/DRESSINGS) IMPLANT
GLOVE BIO SURGEON STRL SZ 6.5 (GLOVE) ×6 IMPLANT
GLOVE ECLIPSE 8.0 STRL XLNG CF (GLOVE) ×2 IMPLANT
GLOVE ECLIPSE 8.5 STRL (GLOVE) ×2 IMPLANT
GLOVE EXAM NITRILE LRG STRL (GLOVE) IMPLANT
GLOVE EXAM NITRILE MD LF STRL (GLOVE) ×2 IMPLANT
GLOVE EXAM NITRILE XL STR (GLOVE) IMPLANT
GLOVE EXAM NITRILE XS STR PU (GLOVE) IMPLANT
GLOVE INDICATOR 6.5 STRL GRN (GLOVE) ×4 IMPLANT
GLOVE INDICATOR 7.0 STRL GRN (GLOVE) ×2 IMPLANT
GLOVE SURG SS PI 7.0 STRL IVOR (GLOVE) ×2 IMPLANT
GOWN BRE IMP SLV AUR LG STRL (GOWN DISPOSABLE) ×4 IMPLANT
GOWN BRE IMP SLV AUR XL STRL (GOWN DISPOSABLE) ×4 IMPLANT
GOWN STRL REIN 2XL LVL4 (GOWN DISPOSABLE) IMPLANT
HEMOSTAT SURGICEL 2X14 (HEMOSTASIS) IMPLANT
KIT BASIN OR (CUSTOM PROCEDURE TRAY) ×2 IMPLANT
KIT ROOM TURNOVER OR (KITS) ×2 IMPLANT
NEEDLE HYPO 22GX1.5 SAFETY (NEEDLE) IMPLANT
NEEDLE HYPO 25X1 1.5 SAFETY (NEEDLE) IMPLANT
NEEDLE SPNL 20GX3.5 QUINCKE YW (NEEDLE) IMPLANT
NS IRRIG 1000ML POUR BTL (IV SOLUTION) ×2 IMPLANT
PACK LAMINECTOMY NEURO (CUSTOM PROCEDURE TRAY) ×2 IMPLANT
PAD EYE OVAL STERILE LF (GAUZE/BANDAGES/DRESSINGS) IMPLANT
PATTIES SURGICAL .5 X.5 (GAUZE/BANDAGES/DRESSINGS) IMPLANT
PATTIES SURGICAL .5 X3 (DISPOSABLE) IMPLANT
RUBBERBAND STERILE (MISCELLANEOUS) ×4 IMPLANT
SPONGE GAUZE 4X4 12PLY (GAUZE/BANDAGES/DRESSINGS) ×2 IMPLANT
SPONGE LAP 4X18 X RAY DECT (DISPOSABLE) IMPLANT
SPONGE SURGIFOAM ABS GEL 100 (HEMOSTASIS) ×2 IMPLANT
STAPLER VISISTAT 35W (STAPLE) IMPLANT
STRIP CLOSURE SKIN 1/2X4 (GAUZE/BANDAGES/DRESSINGS) ×2 IMPLANT
SUT BONE WAX W31G (SUTURE) IMPLANT
SUT ETHILON 4 0 PS 2 18 (SUTURE) IMPLANT
SUT NURALON 4 0 TR CR/8 (SUTURE) IMPLANT
SUT PROLENE 6 0 BV (SUTURE) IMPLANT
SUT VIC AB 0 CT1 18XCR BRD8 (SUTURE) ×1 IMPLANT
SUT VIC AB 0 CT1 8-18 (SUTURE) ×2
SUT VIC AB 2-0 CT1 18 (SUTURE) ×2 IMPLANT
SUT VIC AB 3-0 SH 8-18 (SUTURE) ×2 IMPLANT
SUT VICRYL 4-0 PS2 18IN ABS (SUTURE) IMPLANT
SYR 20ML ECCENTRIC (SYRINGE) ×2 IMPLANT
TAPE CLOTH SURG 4X10 WHT LF (GAUZE/BANDAGES/DRESSINGS) ×2 IMPLANT
TOWEL OR 17X24 6PK STRL BLUE (TOWEL DISPOSABLE) ×2 IMPLANT
TOWEL OR 17X26 10 PK STRL BLUE (TOWEL DISPOSABLE) ×2 IMPLANT
TRAY FOLEY CATH 14FRSI W/METER (CATHETERS) IMPLANT
TUBE CONNECTING 12X1/4 (SUCTIONS) IMPLANT
WATER STERILE IRR 1000ML POUR (IV SOLUTION) ×2 IMPLANT

## 2013-03-30 NOTE — Progress Notes (Signed)
Physical medicine rehabilitation followup. X-rays and imaging he returned as documented with MRI thoracic lumbar spine with progressed epidural tumor at the L1 level. Contact made with neurosurgery and plan surgical intervention. Will hold on formal rehabilitation consult until procedure completed and followup if requested

## 2013-03-30 NOTE — Preoperative (Signed)
Beta Blockers   Reason not to administer Beta Blockers:Not Applicable 

## 2013-03-30 NOTE — Progress Notes (Signed)
Patient with spinal cord compression, transferred to neuro pacu at [facility] for t5/6 laminectomy per md order. Patient transported via [facility], report called to receiving nurse.  VS 98.8, 67, 18, 148/67, O2 99 on room air. CBG prior to transfer 135.

## 2013-03-30 NOTE — H&P (Signed)
  The patient returns with a 24-hour history of increasing bilateral lower extremity weakness more prominently proximally. He's been nonambulatory for 24 hours. He still has voluntary movement of both distal lower extremities. He still has sacral sensation. He is continent of urine but urinary function is always been compromised from his previous prosthetic resection. Emergent workup has demonstrated evidence of marked progression of his epidural tumor at the T4-5 level. This is now causing marked spinal cord compression. Previous decompressive surgery at T6-7 looks good. He has had some progression of his right L1 epidural tumor but this is not causing severe spinal cord or cauda equina compression. He has had progression of his dorsal L3 epidural tumor as well and the epidural tumor behind the body of L5 also appears to be progressing.  Patient is awake and alert. He is oriented and appropriate. His cranial nerve function is intact. His motor function of his upper trimming his is normal. Lower extremity motor function reveals 2/5 strength in both hip flexors and quadriceps. He has 4/5 strength in both dorsiflexors and plantar flexors. He is a relative sensory level in his midthoracic region. Head ears eyes and throat is unremarkable. Chest and abdomen benign. Extremities are free from injury or deformity.  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 I discussed situation with the patient as well as his treating physicians. We are in agreement that his best hope of remaining ambulatory is emergent decompressive surgery followed by adjunctive radiation therapy. Although the lesions at L1, L3 and L5 are also expanding the plan is to treat this with postoperative radiation in an urgent fashion. I've discussed the risks and benefits with the patient. He is aware the risks involved with surgery including but not  limited to the risk of anesthesia bleeding infection CSF leak nerve root injury spinal cord injury tumor recurrence paralysis and nondistended. Patient aware the risks and wishes to proceed.

## 2013-03-30 NOTE — Anesthesia Preprocedure Evaluation (Addendum)
Anesthesia Evaluation  Patient identified by MRN, date of birth, ID band Patient awake    Reviewed: Allergy & Precautions, H&P , NPO status , Patient's Chart, lab work & pertinent test results  Airway       Dental   Pulmonary former smoker,          Cardiovascular hypertension, Pt. 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, LE weakness    GI/Hepatic   Endo/Other  diabetes (glu 109), Well Controlled, Type 2, Oral Hypoglycemic Agents  Renal/GU    Metastatic prostate cancer    Musculoskeletal   Abdominal   Peds  Hematology  (+) Blood dyscrasia (Hb 11.2), anemia ,   Anesthesia Other Findings   Reproductive/Obstetrics                          Anesthesia Physical Anesthesia Plan  ASA: III  Anesthesia Plan: General   Post-op Pain Management:    Induction: Intravenous  Airway Management Planned: Oral ETT  Additional Equipment: Arterial line  Intra-op Plan:   Post-operative Plan: Possible Post-op intubation/ventilation  Informed Consent: I have reviewed the patients History and Physical, chart, labs and discussed the procedure including the risks, benefits and alternatives for the proposed anesthesia with the patient or authorized representative who has indicated his/her understanding and acceptance.     Plan Discussed with: CRNA, Anesthesiologist and Surgeon  Anesthesia Plan Comments:         Anesthesia Quick Evaluation

## 2013-03-30 NOTE — Op Note (Signed)
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  Postoperative diagnosis: Same  Procedure Name: T4/T5 laminectomy and resection of epidural tumor. Microdissection.  Surgeon:Elaya Droege A.Wah Sabic, M.D.  Asst. Surgeon: Gerlene Fee  Anesthesia: General  Indication: 61 year old male with known metastatic prostate carcinoma status post previous T6-7 and L5 laminectomies for resection of epidural tumor. Patient now with progressive paraparesis. Workup demonstrates evidence of marked enlargement of a new metastatic tumor deposit at T4/T5 with marked spinal cord compression. Patient presents now for emergent decompressive surgery prior to palliative/adjunctive radiotherapy.  Operative note: After induction of anesthesia, patient positioned prone onto Wilson frame and appropriately padded. Thoracic region prepped and draped. Incision made overlying the T4-5 and 6 levels. This carried down sharply in the midline. Supper off dissection performed bilaterally exposing the lamina facet joints. Self-retaining traction was placed. X-ray was not needed secondary to the prior laminectomy defect which was readily apparent at T6-7. Spinous processes and lamina of T6-T5 and T4 removed using the high-speed drill and Kerrison rongeurs. Ligament flavum was elevated and resected thecal fashion. Dorsolateral epidural tumor on the right side was readily apparent. This was easily suckable and was removed using gentle suction. Specimen was not taken because this was very consistent with his previous tumor and not felt to be clinically necessary. The tumor invaded and involved the right-sided T5 pedicle. This was partially resected and the tumor was evacuated. During this time microdissection was employed while dissecting within the spinal canal. The ventral epidural tumor was carefully dissected free and removed as we are able. At this  point there is no evidence of any residual tumor all compression. Wound is then irrigated out like solution. Gelfoam and Surgicel were used for hemostasis which was felt to be adequate. A medium Hemovac drain was left in the epidural space. Wounds and close in layers with Vicryl sutures. Steri-Strips and sterile dressing were applied. There were no apparent complications. Patient tolerated the procedure well and he returns to the recovery postop.

## 2013-03-30 NOTE — Transfer of Care (Signed)
Immediate Anesthesia Transfer of Care Note  Patient: Andrew Nelson  Procedure(s) Performed: Procedure(s) with comments: THORACIC LAMINECTOMY FOR TUMOR (Bilateral) - Thoracic Four-Six Laminectomy for Tumor  Patient Location: PACU  Anesthesia Type:General  Level of Consciousness: awake  Airway & Oxygen Therapy: Patient Spontanous Breathing and Patient connected to face mask oxygen  Post-op Assessment: Report given to PACU RN and Post -op Vital signs reviewed and stable  Post vital signs: Reviewed and stable  Complications: No apparent anesthesia complications

## 2013-03-30 NOTE — Progress Notes (Signed)
[facility] PROGRESS NOTE  Andrew Nelson ZOX:[id number] DOB: September 25, 1950 DOA: 03/29/2013 PCP: [name], MD  Brief narrative: 62 y.o. year old male with significant past medical history of castration-resistant prostate cancer, status post radiation therapy, was on chemotherapy with Ellie Lunch which is now on hold, recent L5 tumor with epidural extension and resultant radiculopathy status post T6-T7 laminectomy and resection of epidural tumor, left L5 decompressive hemilaminectomy with resection of epidural tumor on 03/13/2013 who presented to [facility] ED 03/29/2013 with progressively worsening lower extremity weakness over past few days prior to this admission. In ED, his vital signs were stable. CBC revealed a white blood cell count of 14 and hemoglobin of 12.3. BMP revealed a sodium of 134, normal kidney function. MRI of the lumbar spine is pending.  Assessment/Plan:  Principal Problem:   Weakness of both legs - Perhaps related to epidural tumor versus effects of radiation therapy - MRI of the thoracic and lumbar spine is pending - Appreciate radiation oncology following - Spoke with Dr. Dutch Quint of neurosurgery who is aware of patient's admission - Patient is on Decadron 10 mg every 12 hours IV - Pain regimen is with fentanyl patch as well as morphine 1 mg IV every 3 hours as needed - Followup physical therapy evaluation Active Problems:   DM w/o Complication Type I  - CBGs in past 24 hours are 141, 115, 109 - Continue insulin 70/30 mixed 20 units at bedtime - Continue NovoLog sliding scale  Prostate cancer with spinal metastasis - Management per oncology - Now on Decadron 10 mg every 12 hours IV - MRI lumbar and thoracic spine is pending   Hypertension - Continue HCTZ 25 mg daily and lisinopril 10 mg daily   Anemia of chronic disease - Secondary to history of prostate cancer - Hemoglobin stable. No indications for transfusion   Hyponatremia - Likely secondary to dehydration -  Sodium mildly decreased at 134  Code Status: full code Family Communication: no family at the bedside Disposition Plan: needs PT eval; likely SNF placement   [name], MD  [facility] Pager [phone]  If 7PM-7AM, please contact night-coverage [email or web address] Password [facility] 03/30/2013, 11:41 AM   LOS: 1 day   Consultants:  Oncology ([name])  Radiation oncology (Dr. Kathrynn Running)  Procedures:  None   Antibiotics:  None   HPI/Subjective: No acute overnight events.  Objective: Filed Vitals:   03/29/13 1750 03/29/13 2211 03/30/13 0403 03/30/13 0500  BP: 139/73 116/55 122/48   Pulse: 87 62 67   Temp: 98.6 F (37 C) 98.4 F (36.9 C) 98.4 F (36.9 C)   TempSrc: Oral Oral Oral   Resp: 20 18 16    Height: 5' 7.5" (1.715 m)     Weight: 70.489 kg (155 lb 6.4 oz)   71.305 kg (157 lb 3.2 oz)  SpO2: 100% 98% 98%     Intake/Output Summary (Last 24 hours) at 03/30/13 1141 Last data filed at 03/30/13 0900  Gross per 24 hour  Intake 768.75 ml  Output      0 ml  Net 768.75 ml    Exam:   General:  Pt is alert, follows commands appropriately, not in acute distress  Cardiovascular: Regular rate and rhythm, S1/S2, no murmurs, no rubs, no gallops  Respiratory: Clear to auscultation bilaterally, no wheezing, no crackles, no rhonchi  Abdomen: Soft, non tender, non distended, bowel sounds present, no guarding  Extremities: No edema, pulses DP and PT palpable bilaterally  Neuro: Grossly non-focal. Able to lift lower extremities  against gravity minimally.   Data Reviewed: Basic Metabolic Panel:  Recent Labs Lab 03/29/13 1618 03/30/13 0600  NA 134* 134*  K 4.4 3.9  CL 96 99  CO2 22 27  GLUCOSE 123* 122*  BUN 21 19  CREATININE 0.50 0.47*  CALCIUM 9.8 9.6   Liver Function Tests:  Recent Labs Lab 03/29/13 1618 03/30/13 0600  AST 38* 36  ALT 23 19  ALKPHOS 125* 120*  BILITOT 0.4 0.5  PROT 6.8 6.1  ALBUMIN 3.2* 2.8*   No results found for this basename: LIPASE,  AMYLASE,  in the last 168 hours No results found for this basename: AMMONIA,  in the last 168 hours CBC:  Recent Labs Lab 03/29/13 1557 03/30/13 0600  WBC 14.0* 10.0  NEUTROABS 12.5* 9.0*  HGB 12.3* 11.0*  HCT 38.2* 33.5*  MCV 81.6 81.5  PLT Clumped Platelets--Appears Adequate 156   Cardiac Enzymes: No results found for this basename: CKTOTAL, CKMB, CKMBINDEX, TROPONINI,  in the last 168 hours BNP: No components found with this basename: POCBNP,  CBG:  Recent Labs Lab 03/29/13 2009 03/30/13 0008 03/30/13 0401 03/30/13 0754  GLUCAP 120* 151* 141* 115*    Recent Results (from the past 240 hour(s))  TECHNOLOGIST REVIEW     Status: None   Collection Time    03/29/13  3:57 PM      Result Value Range Status   Technologist Review few shistocytes, few helmets, few teardrops   Final     Studies: No results found.  Scheduled Meds: . dexamethasone  10 mg Intravenous Q12H  . doxazosin  8 mg Oral QHS  .  fentaNYL  50 mcg Transdermal Q72H  .  fentaNYL  25 mcg Transdermal Q72H  . heparin  5,000 Units Subcutaneous Q8H  . hydrochlorothiazide  25 mg Oral Daily  . insulin aspart  0-15 Units Subcutaneous Q4H  . insulin aspart protamine-  20 Units Subcutaneous Q supper  . lisinopril  10 mg Oral Daily  . senna  1 tablet Oral Daily   Continuous Infusions: . sodium chloride 75 mL/hr at 03/30/13 1011

## 2013-03-30 NOTE — Progress Notes (Signed)
ANTIBIOTIC CONSULT NOTE - INITIAL  Pharmacy Consult for Vancomycin Indication: Post op laminectomy  Allergies  Allergen Reactions  . Aprindine Other (See Comments)    unknown  . Bc Powder [Aspirin-Salicylamide-Caffeine] Hives  . Penicillins Hives    Patient Measurements: Height: 5\' 7"  (170.2 cm) Weight: 160 lb 4.4 oz (72.7 kg) IBW/kg (Calculated) : 66.1 Adjusted Body Weight:   Vital Signs: Temp: 97.3 F (36.3 C) (08/21 1932) Temp src: Oral (08/21 1630) BP: 160/62 mmHg (08/21 2100) Pulse Rate: 54 (08/21 2100) Intake/Output from previous day: 08/20 0701 - 08/21 0700 In: 768.8 [I.V.:768.8] Out: -  Intake/Output from this shift: Total I/O In: 700 [I.V.:700] Out: 175 [Blood:175]  Labs:  Recent Labs  03/29/13 1557 03/29/13 1618 03/30/13 0600  WBC 14.0*  --  10.0  HGB 12.3*  --  11.0*  PLT Clumped Platelets--Appears Adequate  --  156  CREATININE  --  0.50 0.47*   Estimated Creatinine Clearance: 88.4 ml/min (by C-G formula based on Cr of 0.47). No results found for this basename: Rolm Gala, VANCORANDOM, GENTTROUGH, GENTPEAK, GENTRANDOM, TOBRATROUGH, TOBRAPEAK, TOBRARND, AMIKACINPEAK, AMIKACINTROU, AMIKACIN,  in the last 72 hours   Microbiology: Recent Results (from the past 720 hour(s))  TECHNOLOGIST REVIEW     Status: None   Collection Time    03/29/13  3:57 PM      Result Value Range Status   Technologist Review few shistocytes, few helmets, few teardrops   Final  SURGICAL PCR SCREEN     Status: None   Collection Time    03/30/13  4:39 PM      Result Value Range Status   MRSA, PCR NEGATIVE  NEGATIVE Final   Staphylococcus aureus NEGATIVE  NEGATIVE Final   Comment:            The Xpert SA Assay (FDA     approved for NASAL specimens     in patients over 31 years of age),     is one component of     a comprehensive surveillance     program.  Test performance has     been validated by [employer] for patients greater     than or equal to  17 year old.     It is not intended     to diagnose infection nor to     guide or monitor treatment.    Medical History: Past Medical History  Diagnosis Date  . Diabetes mellitus   . Hypertension   . Prostate ca 6/07    prostate ca dx  . Allergy   . Anemia   . CAD (coronary artery disease)   . Blood transfusion without reported diagnosis     May 2013  . History of radiation therapy 09/19/12-10/10/2012    T-7-T9,35Gy/57fx,Prescaral mass 35Gy/20fx  . History of radiation therapy 05/2006-06/2006    salvage prostate rad tx 68.4Gy  . Bone cancer     prostate ca with mets to spine    Medications:  Scheduled:  . dexamethasone  10 mg Intravenous Q12H  . [START ON 03/31/2013] dexamethasone  4 mg Intravenous Q6H   Or  . [START ON 03/31/2013] dexamethasone  4 mg Oral Q6H  . doxazosin  8 mg Oral QHS  . [START ON 03/31/2013] fentaNYL  50 mcg Transdermal Q72H  . [START ON 03/31/2013] fentaNYL  25 mcg Transdermal Q72H  . heparin  5,000 Units Subcutaneous Q8H  . hydrochlorothiazide  25 mg Oral Daily  . HYDROmorphone      .  insulin aspart  0-15 Units Subcutaneous TID WC  . [START ON 03/31/2013] insulin aspart  0-20 Units Subcutaneous TID WC  . insulin aspart protamine- aspart  20 Units Subcutaneous Q supper  . [START ON 03/31/2013] ketorolac  30 mg Intravenous Q6H  . lisinopril  10 mg Oral Daily  . mupirocin ointment   Nasal BID  . pantoprazole (PROTONIX) IV  40 mg Intravenous QHS  . senna  1 tablet Oral Daily  . senna  1 tablet Oral BID  . sodium chloride  3 mL Intravenous Q12H  . vancomycin  1,000 mg Intravenous Q12H   Assessment: 62 yr old male presented for surgery for resection of an epidural tumor. Pt has metastatic prostate carcinoma and will be receiving radiation following this decompressive surgery. Pt does have a Hemovac drain. Goal of Therapy:  Vancomycin trough level 10-15 mcg/ml  Plan:  Vancomycin 1 Gm IV q12h until discontinued by MD.  Eugene Garnet 03/30/2013,9:32  PM

## 2013-03-30 NOTE — Progress Notes (Signed)
We await therapy evaluations to begin Inpatient rehabilitation consult.

## 2013-03-30 NOTE — Progress Notes (Addendum)
IP PROGRESS NOTE  Subjective:   Patient doing better this am. He is able to move his legs a little more.  No other complaints.   Objective:  Vital signs in last 24 hours: Temp:  [98.4 F (36.9 C)-99 F (37.2 C)] 98.4 F (36.9 C) (08/21 0403) Pulse Rate:  [62-104] 67 (08/21 0403) Resp:  [16-20] 16 (08/21 0403) BP: (116-139)/(48-73) 122/48 mmHg (08/21 0403) SpO2:  [98 %-100 %] 98 % (08/21 0403) Weight:  [155 lb 6.4 oz (70.489 kg)-174 lb (78.926 kg)] 157 lb 3.2 oz (71.305 kg) (08/21 0500) Weight change:  Last BM Date: 03/27/13  Intake/Output from previous day: 08/20 0701 - 08/21 0700 In: 768.8 [I.V.:768.8] Out: -   Mouth: mucous membranes moist, pharynx normal without lesions Resp: clear to auscultation bilaterally Cardio: regular rate and rhythm, S1, S2 normal, no murmur, click, rub or gallop GI: soft, non-tender; bowel sounds normal; no masses,  no organomegaly Extremities: extremities normal, atraumatic, no cyanosis or edema Neuro: Good strength in his knee flexors, but still  Very weak in his hip flexors.   Lab Results:  Recent Labs  03/29/13 1557 03/30/13 0600  WBC 14.0* 10.0  HGB 12.3* 11.0*  HCT 38.2* 33.5*  PLT Clumped Platelets--Appears Adequate 156    BMET  Recent Labs  03/29/13 1618 03/30/13 0600  NA 134* 134*  K 4.4 3.9  CL 96 99  CO2 22 27  GLUCOSE 123* 122*  BUN 21 19  CREATININE 0.50 0.47*  CALCIUM 9.8 9.6    Studies/Results: MRI pending.   Medications: I have reviewed the patient's current medications.  Assessment/Plan:  This is a 62 year old gentleman with the following issues:  1. Castration-resistant prostate cancer. He has metastatic disease with lymphadenopathy and pelvic presacral mass. Recently has been on Xtandi, but stopped due to disease progression. CT scan from 08/2012 showed disease progression with a large pelvic mass. He is s/p XRT to this mass. He is now on Jevtana with a reduced dose (20 mg/m2). This has been on hold  for now. It is possible that he has progressed on this current treatment.         2.  Recent L5 tumor with epidural extension and resultant radiculopathy.He under went T6-T7 laminectomy and resection of epidural tumor Left L5 decompressive hemilaminectomy with resection of epidural tumor on 03/13/2013.         3.  Acute neurological deficit with in ability to use his hip flexors. MRI is pending to determine any new lesion. On decadron now.         4.  Anemia: this has resolved now.     LOS: 1 day   [facility] 03/30/2013, 7:37 AM

## 2013-03-30 NOTE — Progress Notes (Addendum)
Physical medicine and rehabilitation consult requested. Patient has been admitted 03/29/2013. Please were physical and occupational therapy evaluations and await their recommendations prior to completing rehabilitation consult

## 2013-03-30 NOTE — Progress Notes (Signed)
  Radiation Oncology         [phone] ________________________________  Name: Andrew Nelson MRN: [id number]  Date: 03/29/2013  DOB: December 04, 1950  Chart Note:  I reviewed this patient's most recent MRI.  It shows progressive spinal disease with increasing epidural tumor and cord impingement at T5, conus impingement at L1, and nerve root impingment at L5.  These sites have shown progression since 8/4.  Radiographic Findings:  Mr Thoracic Spine Wo Contrast  IMPRESSION: MR THORACIC SPINE IMPRESSION   1. Progression of thoracic epidural tumor centered at the T5 vertebral level with cord compression. No definite cord signal abnormality. 2. Interval thoracic spine decompression centered at T6-T7. No adverse features.   3. Thoracic paraspinal and foraminal tumor elsewhere but no additional thoracic cord compression.    MR LUMBAR SPINE IMPRESSION   1. Progressed epidural tumor at the L1 level emanating from the right pedicle/ posterior elements with new spinal stenosis corresponding to the level of the conus medullaris. No conus signal abnormality.   2. Increased spinal stenosis at L3, mild and related to both dorsal epidural tumor and epidural lipomatosis.  3. Mild progression of epidural and foraminal tumor centered at the L5 level. Spinal stenosis at L5 appears increased and severe despite interval left laminectomy changes at this level.   Impression:  In light of this information, The patient has been transferred to [facility] to neurosurgical intervention.  Post-op, he may benefit from radiotherapy, or radiosurgery.  Plan:  At this point, the patient is set up to proceed with surgical intervention, and I will discuss further options with Dr. Jordan Likes and [name].  ________________________________  Artist Pais. Kathrynn Running, M.D.

## 2013-03-30 NOTE — Brief Op Note (Signed)
03/29/2013 - 03/30/2013  7:10 PM  PATIENT:  Jaheim L Faulconer  62 y.o. male  PRE-OPERATIVE DIAGNOSIS:  metastatic epidural tumor  POST-OPERATIVE DIAGNOSIS:  metastatic epidural tumor  PROCEDURE:  Procedure(s) with comments: THORACIC LAMINECTOMY FOR TUMOR (Bilateral) - Thoracic Four-Six Laminectomy for Tumor  SURGEON:  Surgeon(s) and Role:    * Temple Pacini, MD - Primary    * Reinaldo Meeker, MD - Assisting  PHYSICIAN ASSISTANT:   ASSISTANTS:    ANESTHESIA:   general  EBL:     BLOOD ADMINISTERED:none  DRAINS: (Medium) Hemovact drain(s) in the Epidural space with  Suction Open   LOCAL MEDICATIONS USED:  NONE  SPECIMEN:  No Specimen  DISPOSITION OF SPECIMEN:  N/A  COUNTS:  YES  TOURNIQUET:  * No tourniquets in log *  DICTATION: .Dragon Dictation  PLAN OF CARE: Admit to inpatient   PATIENT DISPOSITION:  PACU - hemodynamically stable.   Delay start of Pharmacological VTE agent (>24hrs) due to surgical blood loss or risk of bleeding: yes

## 2013-03-30 NOTE — Care Management Note (Signed)
   CARE MANAGEMENT NOTE 03/30/2013  Patient:  Andrew Nelson, Andrew Nelson   Account Number:  [id number]  Date Initiated:  03/30/2013  Documentation initiated by:  Flor Houdeshell  Subjective/Objective Assessment:   62 yo male admitted with bilateral lower extremity weakness.     Action/Plan:   Home when stable   Anticipated DC Date:     Anticipated DC Plan:  IP REHAB FACILITY      DC Planning Services  CM consult      Choice offered to / List presented to:  NA   DME arranged  NA      DME agency  NA     HH arranged  NA      HH agency  NA   Status of service:  In process, will continue to follow Medicare Important Message given?   (If response is "NO", the following Medicare IM given date fields will be blank) Date Medicare IM given:   Date Additional Medicare IM given:    Discharge Disposition:    Per UR Regulation:  Reviewed for med. necessity/level of care/duration of stay  If discussed at Long Length of Stay Meetings, dates discussed:    Comments:  03/30/13 1608 Nasire Reali,RN,MSN [phone] Chart reviewed. CIR following for possible pt placement consult upon surgical intervention.

## 2013-03-30 NOTE — Anesthesia Procedure Notes (Signed)
Procedure Name: Intubation Date/Time: 03/30/2013 6:20 PM Performed by: Brien Mates DOBSON Pre-anesthesia Checklist: Patient identified, Patient being monitored, Suction available, Emergency Drugs available and Timeout performed Patient Re-evaluated:Patient Re-evaluated prior to inductionOxygen Delivery Method: Circle system utilized Preoxygenation: Pre-oxygenation with 100% oxygen Intubation Type: IV induction Ventilation: Mask ventilation without difficulty Laryngoscope Size: Miller and 2 (grade3/4 with miller 2. grade 1 with glidescope) Grade View: Grade I Tube type: Oral Tube size: 7.5 mm Number of attempts: 2 (DL x 1 with miller 2 with no attempt to place ETT. DL x 1 with Gllidescope with easy placement of ETT.) Airway Equipment and Method: Stylet and Video-laryngoscopy Placement Confirmation: ETT inserted through vocal cords under direct vision,  positive ETCO2 and breath sounds checked- equal and bilateral Secured at: 22 cm Tube secured with: Tape Dental Injury: Teeth and Oropharynx as per pre-operative assessment

## 2013-03-30 NOTE — Anesthesia Postprocedure Evaluation (Signed)
Anesthesia Post Note  Patient: Andrew Nelson  Procedure(s) Performed: Procedure(s) (LRB): THORACIC LAMINECTOMY FOR TUMOR (Bilateral)  Anesthesia type: general  Patient location: PACU  Post pain: Pain level controlled  Post assessment: Patient's Cardiovascular Status Stable  Last Vitals:  Filed Vitals:   03/30/13 2015  BP: 154/73  Pulse:   Temp:   Resp:     Post vital signs: Reviewed and stable  Level of consciousness: sedated  Complications: No apparent anesthesia complications

## 2013-03-31 ENCOUNTER — Telehealth: Payer: Self-pay | Admitting: Oncology

## 2013-03-31 ENCOUNTER — Other Ambulatory Visit: Payer: Self-pay | Admitting: Oncology

## 2013-03-31 ENCOUNTER — Encounter (HOSPITAL_COMMUNITY): Payer: Self-pay | Admitting: Neurosurgery

## 2013-03-31 DIAGNOSIS — C7949 Secondary malignant neoplasm of other parts of nervous system: Secondary | ICD-10-CM

## 2013-03-31 DIAGNOSIS — Z8546 Personal history of malignant neoplasm of prostate: Secondary | ICD-10-CM

## 2013-03-31 LAB — GLUCOSE, CAPILLARY
Glucose-Capillary: 122 mg/dL — ABNORMAL HIGH (ref 70–99)
Glucose-Capillary: 151 mg/dL — ABNORMAL HIGH (ref 70–99)

## 2013-03-31 NOTE — Consult Note (Signed)
Physical medicine and rehabilitation consult requested. Patient currently slow to mobilize with physical and occupational therapy evaluations initiated. Will followup with formal rehabilitation consult

## 2013-03-31 NOTE — Progress Notes (Signed)
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 Pt now s/p T4/T5 laminectomy and resection of epidural tumor.  Clinical Impression  Pt admitted with above. Pt currently with functional limitations due to the deficits listed below (see PT Problem List). Will benefit from Rehab consult.  Has bil UE and LE weakness with poor proprioception bil LEs.  Pt will benefit from skilled PT to increase their independence and safety with mobility to allow discharge to the venue listed below.     PT Assessment  Patient needs continued PT services    Follow Up Recommendations  CIR;Supervision/Assistance - 24 hour                Equipment Recommendations  None recommended by PT    Recommendations for Other Services Rehab consult   Frequency Min 5X/week    Precautions / Restrictions Precautions Precautions: Fall;Back Precaution Booklet Issued: Yes (comment) Precaution Comments: able to recall 3/3 precautions Restrictions Weight Bearing Restrictions: No   Pertinent Vitals/Pain HR incr to 135 bpm.  Some pain per pt       Mobility  Bed Mobility Bed Mobility: Rolling  Left;Left Sidelying to Sit;Sitting - Scoot to Edge of Bed Rolling Left: 4: Min assist Left Sidelying to Sit: 2: Max assist;HOB flat Sitting - Scoot to [employer] of Bed: 4: Min assist Details for Bed Mobility Assistance: VCs for technqiue.  Assist to support trunk transition OOB.  Transfers Transfers: Sit to Stand;Stand to Sit Sit to Stand: 1: +2 Total assist;From bed;From chair/3-in-1;With upper extremity assist;With armrests Sit to Stand: Patient Percentage: 40% Stand to Sit: 1: +2 Total assist;To chair/3-in-1;With armrests;With upper extremity assist Stand to Sit: Patient Percentage: 40% Details for Transfer Assistance: +2 total assist to block bil knees to prevent buckling and to assist with power up.  Pt also requiring manual assist to prevent RLE from extending back underneath him. Max verbal cueing for safe hand placement.Transferred x 2 - did not use RW as pt's bil LES would extend posteriorly and had to get in front of pt and control the knees and feet to be able to get pt effectively weight bearing.  Pt able to move his right LE around to get from bed to 3N1 but fatigued once on potty and could not move LE to get to recliner.  Nursing came in a nd PT/OT stood pt blocking knees and using gait belt for posture while nursing switched commode with chair.   Ambulation/Gait Ambulation/Gait Assistance: Not tested (comment) Stairs: No Wheelchair Mobility Wheelchair Mobility: No         PT Diagnosis: Difficulty walking;Acute pain  PT Problem List: Decreased balance;Decreased mobility;Decreased safety awareness;Decreased knowledge of precautions;Pain PT Treatment Interventions: DME instruction;Gait training;Stair training;Functional mobility training;Therapeutic activities;Balance training;Neuromuscular re-education;Patient/family education     PT Goals(Current goals can be found  in the care plan section) Acute Rehab PT Goals Patient Stated Goal: to be independent at home  PT Goal Formulation:  With patient Time For Goal Achievement: 04/14/13 Potential to Achieve Goals: Good  Visit Information  Last PT Received On: 03/31/13 Assistance Needed: +2 PT/OT Co-Evaluation/Treatment: Yes History of Present Illness: Pt with significant past medical history of castration-resistant prostate cancer, status post radiation therapy, was on chemotherapy with Ellie Lunch which is now on hold, 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 Pt now s/p T4/T5 laminectomy and resection of epidural tumor.       Prior Functioning  Home Living Family/patient expects to be discharged to:: Private residence Living Arrangements: Spouse/significant other Available Help at Discharge: Available 24 hours/day;Family Type of Home: House Home Access: Stairs to enter [employer] of Steps: 3 Entrance Stairs-Rails: Right Home Layout: Two level;Bed/bath upstairs Alternate Level Stairs-Number of Steps: 12 Alternate Level Stairs-Rails: Can reach both Home Equipment: Shower seat;Bedside commode;Other (comment);Walker - 2 wheels;Adaptive equipment Adaptive Equipment: Reacher;Sock aid;Long-handled shoe horn Prior Function Level of Independence: Independent Communication Communication: No difficulties    Cognition  Cognition Arousal/Alertness: Awake/alert Behavior During Therapy: WFL for tasks assessed/performed Overall Cognitive Status: Within Functional Limits for tasks assessed    Extremity/Trunk Assessment Upper Extremity Assessment Upper Extremity Assessment: Defer to OT  evaluation Lower Extremity Assessment Lower Extremity Assessment: RLE deficits/detail;LLE deficits/detail RLE Deficits / Details:  3/5 knee extension, hip extension 2+/5, hip flexion  2+/5, knee flexion 3/5, dorsiflexion 2/5.  RLE Sensation: decreased proprioception;decreased light touch RLE Coordination: decreased fine motor;decreased gross motor LLE Deficits / Details: Hip extension 2/5, hip flexion 2/5, knee extension 2+/5, knee flexion 3/5, dorsiflexion 2/5 LLE Sensation: decreased proprioception;decreased light touch LLE Coordination: decreased fine motor;decreased gross motor   Balance Static Sitting Balance Static Sitting - Balance Support: No upper extremity supported;Feet supported;Bilateral upper extremity supported Static Sitting - Level of Assistance: 5: Stand by assistance;4: Min assist Static Sitting - Comment/# of Minutes: Pt requires min asssit to sit without UE assist to prevent posterior lean.    End of Session PT - End of Session Equipment Utilized During Treatment: Gait belt Activity Tolerance: Patient tolerated treatment well Patient left: in chair;with call bell/phone within reach;with family/visitor present Nurse Communication: Mobility status;Need for lift equipment       INGOLD,Kavina Cantave 03/31/2013, 1:17 PM [facility] Acute Rehabilitation [phone] (pager)

## 2013-03-31 NOTE — Progress Notes (Signed)
Rehab Admissions Coordinator Note:  Patient was screened by Clois Dupes for appropriateness for an Inpatient Acute Rehab Consult.  At this time, we are recommending Inpatient Rehab consult. I will order.  Clois Dupes 03/31/2013, 10:52 AM  I can be reached at [phone].

## 2013-03-31 NOTE — Progress Notes (Signed)
  Radiation Oncology         [phone] ________________________________  Name: Andrew Nelson MRN: [id number]  Date: 03/29/2013  DOB: 1951-03-04  Chart Note:  Patient tolerated surgery and is stable in the neuro ICU.  If stable for hospital transfer, I would suggest transfer to [facility] to facilitate urgent radiotherapy to L1 and L5 starting Monday.  ________________________________  Artist Pais. Kathrynn Running, M.D.

## 2013-03-31 NOTE — Consult Note (Signed)
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 Jordan Likes   HPI: Andrew Nelson is a 62 y.o. right-handed male with history of known metastatic prostate carcinoma status post previous T6-7 and L5 laminectomies for resection of epidural tumor 03/14/2013 and was discharged to home 03/16/2013. Patient was seen by his oncologist [name] 03/29/2013 with patient reporting acute onset of lower extremity weakness to the point he could not get out of bed. Patient's prior functional status was able to ambulate with minimal effort using a walker while at home. He was admitted 03/29/2013 for further evaluation. Emergent workup demonstrated evidence of marked progression of his epidural tumor at T4-5 level causing severe spinal cord cauda equina compression. He had progression of his dorsal L3 epidural tumor as well and the epidural tumor behind the body of L5 also appear to be progressing.. patient underwent T4-T5 laminectomy resection of epidural tumor 03/30/2013 per Dr. Jordan Likes. Postoperative pain management. Subcutaneous heparin for DVT prophylaxis. Physical occupational therapy evaluations pending. M.D. as requested physical medicine rehabilitation consult to consider inpatient rehabilitation services   Review of Systems  Gastrointestinal: Positive for constipation.  Genitourinary: Positive for urgency.  Musculoskeletal: Positive for back pain.  Neurological: Positive for weakness.  All other systems reviewed and are negative.   Past Medical History  Diagnosis Date  . Diabetes mellitus   . Hypertension   . Prostate ca 6/07    prostate ca dx  . Allergy   . Anemia   . CAD (coronary artery disease)   . Blood transfusion without reported diagnosis     May 2013  . History of radiation therapy 09/19/12-10/10/2012    T-7-T9,35Gy/15fx,Prescaral mass 35Gy/30fx  . History of radiation therapy 05/2006-06/2006    salvage  prostate rad tx 68.4Gy  . Bone cancer     prostate ca with mets to spine   Past Surgical History  Procedure Laterality Date  . Prostatectomy  03/03/06    radical,gleason 4+5=9,Adenocarcinoma  . Colonoscopy w/ polypectomy  03/01/01    hyperplastic,no adenomatous change or malignancy  . Portacath placement      right  . Laminectomy N/A 03/14/2013    Procedure: Thoracic six-seven laminectomy, resection of tumor. Lumbar five laminectomy and resection of tumor;  Surgeon: Temple Pacini, MD;  Location: [facility];  Service: Neurosurgery;  Laterality: N/A;   Family History  Problem Relation Age of Onset  . Cancer Other     prostate  . Stroke Other   . Prostate cancer Father   . Colon cancer Paternal Grandfather    Social History:  reports that he has quit smoking. He has never used smokeless tobacco. He reports that he does not drink alcohol or use illicit drugs. Allergies:  Allergies  Allergen Reactions  . Aprindine Other (See Comments)    unknown  . Bc Powder [Aspirin-Salicylamide-Caffeine] Hives  . Penicillins Hives   Medications Prior to Admission  Medication Sig Dispense Refill  . ACCU-CHEK AVIVA PLUS test strip USE AS DIRECTED  100 strip  3  . dexamethasone (DECADRON) 4 MG tablet Take 0.5 tablets (2 mg total) by mouth 2 (two) times daily with a meal.  60 tablet  0  . doxazosin (CARDURA) 8 MG tablet Take 8 mg by mouth at bedtime.      . fentaNYL (DURAGESIC - DOSED MCG/HR) 25 MCG/HR patch Place 1 patch (25 mcg total) onto the skin every 3 (three) days. Pt put patch on Sunday. Pt takes  a total of 75mg  dose  10 patch  0  . fentaNYL (DURAGESIC - DOSED MCG/HR) 50 MCG/HR Place 1 patch onto the skin every 3 (three) days. Pt put patch on Sunday. Pt takes a total of 75 mg dose      . hydrochlorothiazide (HYDRODIURIL) 25 MG tablet Take 25 mg by mouth daily.      . insulin aspart protamine-insulin aspart (NOVOLOG 70/30) (70-30) 100 UNIT/ML injection Inject 20 Units into the skin daily with  supper.       Marland Kitchen leuprolide (LUPRON) 30 MG injection Inject 30 mg into the muscle every 4 (four) months. Pt had 2 weeks ago      . lidocaine-prilocaine (EMLA) cream Apply 1 application topically daily as needed (APPLY TO PORTA-CATH SITE 30 MINS TO 2 HOURS PRIOR TO TREATMENT & COVER WITH PLASTIC WRAP).      Marland Kitchen lisinopril (PRINIVIL,ZESTRIL) 10 MG tablet Take 10 mg by mouth daily.      . metFORMIN (GLUCOPHAGE) 850 MG tablet Take 850 mg by mouth 2 (two) times daily with a meal.      . oxyCODONE-acetaminophen (PERCOCET) 10-325 MG per tablet Take 1-2 tablets by mouth every 4 (four) hours as needed for pain.  80 tablet  0  . senna (SENOKOT) 8.6 MG TABS tablet Take 1 tablet by mouth.        Home: Home Living Family/patient expects to be discharged to:: Private residence Living Arrangements: Spouse/significant other Available Help at Discharge: Available 24 hours/day;Family Type of Home: House Home Access: Stairs to enter [employer] of Steps: 3 Entrance Stairs-Rails: Right Home Layout: Two level;Bed/bath upstairs Alternate Level Stairs-Number of Steps: 12 Alternate Level Stairs-Rails: Can reach both Home Equipment: Shower seat;Bedside commode;Other (comment);Walker - 2 wheels;Adaptive equipment Adaptive Equipment: Reacher;Sock aid;Long-handled shoe horn  Functional History:   Functional Status:  Mobility: Bed Mobility Bed Mobility: Rolling Left;Left Sidelying to Sit;Sitting - Scoot to Edge of Bed Rolling Left: 4: Min assist Left Sidelying to Sit: 2: Max assist;HOB flat Sitting - Scoot to [place] of Bed: 4: Min assist Transfers Sit to Stand: 1: +2 Total assist;From bed;From chair/3-in-1;With upper extremity assist;With armrests Sit to Stand: Patient Percentage: 40% Stand to Sit: 1: +2 Total assist;To chair/3-in-1;With armrests;With upper extremity assist Stand to Sit: Patient Percentage: 40%      ADL: ADL Eating/Feeding: Performed;Independent Where Assessed - Eating/Feeding:  Bed level Upper Body Dressing: Performed;Minimal assistance Where Assessed - Upper Body Dressing: Supported sitting Lower Body Dressing: Performed;+1 Total assistance Where Assessed - Lower Body Dressing: Supported sitting Toilet Transfer: Performed;+2 Total assistance Toilet Transfer Method: Surveyor, minerals: Bedside commode Equipment Used: Gait belt;Rolling walker Transfers/Ambulation Related to ADLs: +2 total assist for sit<>stand and for SPT from bed<>3n1.  Pt requriring assist due to bil LE weakness and decreased proprioception and sensation in LEs.  When attempting to transfer from 3n1<>chair, pt's RLE extending back under 3n1 (unsure if this is tone vs poor proprioception), RN then arrived to assist.  Pt stood with +2 total assist from 3n1 with PT/OT while RN removed 3n1 and placed chair behind pt. ADL Comments: Pt able to perform toileting hygiene while sitting on 3n1 with setup/supervision.  Increased assist for LB ADLs and functional mobility due to LE weakness and sitting balance deficit.  Cognition: Cognition Overall Cognitive Status: Within Functional Limits for tasks assessed Orientation Level: Oriented X4 Cognition Arousal/Alertness: Awake/alert Behavior During Therapy: WFL for tasks assessed/performed Overall Cognitive Status: Within Functional Limits for tasks assessed  Blood pressure  137/69, pulse 71, temperature 98.4 F (36.9 C), temperature source Oral, resp. rate 18, height 5\' 7"  (1.702 m), weight 72.7 kg (160 lb 4.4 oz), SpO2 98.00%. Physical Exam  Vitals reviewed. Constitutional: He is oriented to person, place, and time.  HENT:  Head: Normocephalic.  Eyes: EOM are normal.  Neck: Normal range of motion. Neck supple. No thyromegaly present.  Cardiovascular: Normal rate and regular rhythm.   Pulmonary/Chest: Effort normal and breath sounds normal. No respiratory distress.  Abdominal: Soft. Bowel sounds are normal. He exhibits no distension.   Neurological: He is alert and oriented to person, place, and time.  Mid to high thoracic sensory level, below which he has 1 to 1+ sensation. UE strength 5/5. LE: HF2-, KE 2 to 2+, ADF and APF 3 to 3+. dtr's tr to 1+  Skin:  Back incision is to rest  Psychiatric: He has a normal mood and affect. His behavior is normal. Judgment and thought content normal.    Results for orders placed during the hospital encounter of 03/29/13 (from the past 24 hour(s))  GLUCOSE, CAPILLARY     Status: Abnormal   Collection Time    03/30/13 11:38 AM      Result Value Range   Glucose-Capillary 109 (*) 70 - 99 mg/dL   Comment 1 Notify RN    SURGICAL PCR SCREEN     Status: None   Collection Time    03/30/13  4:39 PM      Result Value Range   MRSA, PCR NEGATIVE  NEGATIVE   Staphylococcus aureus NEGATIVE  NEGATIVE  GLUCOSE, CAPILLARY     Status: Abnormal   Collection Time    03/30/13  4:48 PM      Result Value Range   Glucose-Capillary 135 (*) 70 - 99 mg/dL   Comment 1 Notify RN    TYPE AND SCREEN     Status: None   Collection Time    03/30/13  5:33 PM      Result Value Range   ABO/RH(D) O POS     Antibody Screen NEG     Sample Expiration 04/02/2013     Unit Number [id number]     Blood Component Type RBC LR PHER2     Unit division 00     Status of Unit ALLOCATED     Transfusion Status OK TO TRANSFUSE     Crossmatch Result Compatible     Unit Number [id number]     Blood Component Type RBC LR PHER2     Unit division 00     Status of Unit ALLOCATED     Transfusion Status OK TO TRANSFUSE     Crossmatch Result Compatible    GLUCOSE, CAPILLARY     Status: Abnormal   Collection Time    03/30/13  7:38 PM      Result Value Range   Glucose-Capillary 157 (*) 70 - 99 mg/dL  GLUCOSE, CAPILLARY     Status: Abnormal   Collection Time    03/30/13  9:43 PM      Result Value Range   Glucose-Capillary 143 (*) 70 - 99 mg/dL   Comment 1 Notify RN     Comment 2 Documented in Chart     Mr Thoracic  Spine Wo Contrast  03/30/2013   CLINICAL DATA:  62 year old male with prostate cancer known to be widely metastatic to bone. History of thoracic cord compression status post decompression earlier this month. Acute paraplegia x1 day. Sudden onset severe lower extremity  weakness.  EXAM: MRI THORACIC AND LUMBAR SPINE WITHOUT CONTRAST  TECHNIQUE: Multiplanar and multiecho pulse sequences of the thoracic and lumbar spine were obtained without intravenous contrast.  COMPARISON:  Preoperative thoracic and lumbar MRI 03/13/2013.  FINDINGS: MR THORACIC SPINE FINDINGS  Limited sagittal imaging of the cervical spine again suggest diffuse cervical vertebral tumor involvement.  Interval diffuse thoracic spine bony metastatic disease, less pronounced at the T7, T8, and T9 vertebral bodies as before. Interval thoracic spine decompression centered at the T6-T7 level. Small volume of posterior postoperative paraspinal fluid. No mass effect on the thecal sac at this level.  Confluent right greater than left epidural tumor with cord compression at the T5 level, appears progressed since 03/13/2013. Epidural tumor continues to the upper aspect of the decompression level. Moderate to severe foraminal involvement by tumor at the bilateral T5 and right T4 neural foramen.  Otherwise epidural/ extraosseous tumor in the thoracic spine is most pronounced at the right T1 neural foramen/costovertebral junction, the posterior T1 epidural space (series 8, image 3), right T2 neural foramen, and left T11 pedicle/neural foramen. No additional cord compression.  No definite abnormal spinal cord signal/edema identified. Conus medullaris is described in the lumbar section below.  Additional rib associated and paraspinal tumor in the thorax (on the left at T11-T12, on the right at T8, see image 8).  Lumbar findings are described below. 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 There is a 2 cm area of increased signal in  the right hepatic lobe which might represent liver metastatic disease (series 8, image 44).  MR LUMBAR SPINE FINDINGS  Diffuse lumbar spine metastatic disease.  Progression of epidural tumor arising from the right L1 pedicle and posterior elements, with mild spinal stenosis now at the level of the conus (series 15, image 7). Mild involvement of the right T1 neural foramen not significantly changed. No signal abnormality in the conus.  Dorsal epidural tumor at the L3 level along with increased epidural lipomatosis here now results in mild L3 spinal stenosis.  Bulky ventral epidural tumor re- identified at the L5 level with increased spinal stenosis at this level, now severe. Interval left hemilaminectomy with a small volume of postoperative fluid in the laminectomy space. Severe epidural tumor involvement of the left L4 neural foramen again noted, in conjunction with ventral L4 epidural tumor which is not significantly changed. Severe involvement of the left L5 neural foramen is stable. Severe involvement of the right L5 neural foramen appears progressed.  Sacral spinal canal remains patent. Stable visualized abdominal viscera. Postoperative changes in the left lower paraspinal soft tissues.  IMPRESSION: MR THORACIC SPINE IMPRESSION  1. Progression of thoracic epidural tumor centered at the T5 vertebral level with cord compression. No definite cord signal abnormality.  2. Interval thoracic spine decompression centered at T6-T7. No adverse features.  3. Thoracic paraspinal and foraminal tumor elsewhere but no additional thoracic cord compression.  MR LUMBAR SPINE IMPRESSION  1. Progressed epidural tumor at the L1 level emanating from the right pedicle/ posterior elements with new spinal stenosis corresponding to the level of the conus medullaris. No conus signal abnormality.  2. Increased spinal stenosis at L3, mild and related to both dorsal epidural tumor and epidural lipomatosis. 3. Mild progression of epidural and  foraminal tumor centered at the L5 level. Spinal stenosis at L5 appears increased and severe despite interval left laminectomy changes at this level.  I spoke to Dr. Elisabeth Pigeon on the [facility] hospitalist team at the  time of this report. She advised that she had already discussed this exam with Dr. Jordan Likes of neurosurgery who is advising transfer of the patient to [facility] for an emergent surgical spine decompression.   Electronically Signed   By: Augusto Gamble   On: 03/30/2013 15:19   Mr Lumbar Spine Wo Contrast  03/30/2013   CLINICAL DATA:  62 year old male with prostate cancer known to be widely metastatic to bone. History of thoracic cord compression status post decompression earlier this month. Acute paraplegia x1 day. Sudden onset severe lower extremity weakness.  EXAM: MRI THORACIC AND LUMBAR SPINE WITHOUT CONTRAST  TECHNIQUE: Multiplanar and multiecho pulse sequences of the thoracic and lumbar spine were obtained without intravenous contrast.  COMPARISON:  Preoperative thoracic and lumbar MRI 03/13/2013.  FINDINGS: MR THORACIC SPINE FINDINGS  Limited sagittal imaging of the cervical spine again suggest diffuse cervical vertebral tumor involvement.  Interval diffuse thoracic spine bony metastatic disease, less pronounced at the T7, T8, and T9 vertebral bodies as before. Interval thoracic spine decompression centered at the T6-T7 level. Small volume of posterior postoperative paraspinal fluid. No mass effect on the thecal sac at this level.  Confluent right greater than left epidural tumor with cord compression at the T5 level, appears progressed since 03/13/2013. Epidural tumor continues to the upper aspect of the decompression level. Moderate to severe foraminal involvement by tumor at the bilateral T5 and right T4 neural foramen.  Otherwise epidural/ extraosseous tumor in the thoracic spine is most pronounced at the right T1 neural foramen/costovertebral junction, the posterior T1 epidural space  (series 8, image 3), right T2 neural foramen, and left T11 pedicle/neural foramen. No additional cord compression.  No definite abnormal spinal cord signal/edema identified. Conus medullaris is described in the lumbar section below.  Additional rib associated and paraspinal tumor in the thorax (on the left at T11-T12, on the right at T8, see image 8).  Lumbar findings are described below. 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 There is a 2 cm area of increased signal in the right hepatic lobe which might represent liver metastatic disease (series 8, image 44).  MR LUMBAR SPINE FINDINGS  Diffuse lumbar spine metastatic disease.  Progression of epidural tumor arising from the right L1 pedicle and posterior elements, with mild spinal stenosis now at the level of the conus (series 15, image 7). Mild involvement of the right T1 neural foramen not significantly changed. No signal abnormality in the conus.  Dorsal epidural tumor at the L3 level along with increased epidural lipomatosis here now results in mild L3 spinal stenosis.  Bulky ventral epidural tumor re- identified at the L5 level with increased spinal stenosis at this level, now severe. Interval left hemilaminectomy with a small volume of postoperative fluid in the laminectomy space. Severe epidural tumor involvement of the left L4 neural foramen again noted, in conjunction with ventral L4 epidural tumor which is not significantly changed. Severe involvement of the left L5 neural foramen is stable. Severe involvement of the right L5 neural foramen appears progressed.  Sacral spinal canal remains patent. Stable visualized abdominal viscera. Postoperative changes in the left lower paraspinal soft tissues.  IMPRESSION: MR THORACIC SPINE IMPRESSION  1. Progression of thoracic epidural tumor centered at the T5 vertebral level with cord compression. No definite cord signal abnormality.  2. Interval thoracic spine decompression centered  at T6-T7. No adverse features.  3. Thoracic paraspinal and foraminal tumor elsewhere but no additional thoracic cord compression.  MR  LUMBAR SPINE IMPRESSION  1. Progressed epidural tumor at the L1 level emanating from the right pedicle/ posterior elements with new spinal stenosis corresponding to the level of the conus medullaris. No conus signal abnormality.  2. Increased spinal stenosis at L3, mild and related to both dorsal epidural tumor and epidural lipomatosis. 3. Mild progression of epidural and foraminal tumor centered at the L5 level. Spinal stenosis at L5 appears increased and severe despite interval left laminectomy changes at this level.  I spoke to Dr. Elisabeth Pigeon on the [facility] hospitalist team at the time of this report. She advised that she had already discussed this exam with Dr. Jordan Likes of neurosurgery who is advising transfer of the patient to [facility] for an emergent surgical spine decompression.   Electronically Signed   By: Augusto Gamble   On: 03/30/2013 15:19    Assessment/Plan: Diagnosis: metastatic prostate cancer to the spine s/p   thoracic decompression for myelopathy 1. Does the need for close, 24 hr/day medical supervision in concert with the patient's rehab needs make it unreasonable for this patient to be served in a less intensive setting? Yes 2. Co-Morbidities requiring supervision/potential complications: dm, thn 3. Due to bladder management, bowel management, safety, skin/wound care, disease management, medication administration, pain management and patient education, does the patient require 24 hr/day rehab nursing? Yes 4. Does the patient require coordinated care of a physician, rehab nurse, PT (1-2 hrs/day, 5 days/week) and OT (1-2 hrs/day, 5 days/week) to address physical and functional deficits in the context of the above medical diagnosis(es)? Yes Addressing deficits in the following areas: balance, endurance, locomotion, strength, transferring, bowel/bladder  control, bathing, dressing, feeding, grooming, toileting and psychosocial support 5. Can the patient actively participate in an intensive therapy program of at least 3 hrs of therapy per day at least 5 days per week? Yes 6. The potential for patient to make measurable gains while on inpatient rehab is good 7. Anticipated functional outcomes upon discharge from inpatient rehab are mod I to min assist with PT, mod I to min assist with OT, n/a with SLP. 8. Estimated rehab length of stay to reach the above functional goals is: 2-3 weeks 9. Does the patient have adequate social supports to accommodate these discharge functional goals? Yes 10. Anticipated D/C setting: Home 11. Anticipated post D/C treatments: HH therapy 12. Overall Rehab/Functional Prognosis: excellent  RECOMMENDATIONS: This patient's condition is appropriate for continued rehabilitative care in the following setting: CIR Patient has agreed to participate in recommended program. Yes Note that insurance prior authorization may be required for reimbursement for recommended care.  Comment: Rehab RN to follow up. Wife with limited physical ability to assist. Pt is motivated. Continued XRT needed per rad-onc which can be performed after the therapy day.   Ranelle Oyster, MD, [place] [facility]     03/31/2013

## 2013-03-31 NOTE — Evaluation (Signed)
Occupational Therapy Evaluation Patient Details Name: Andrew Nelson MRN: [id number] DOB: 1950-09-05 Today's Date: 03/31/2013 Time: 4098-1191 OT Time Calculation (min): 35 min  OT Assessment / Plan / Recommendation History of present illness Pt with significant past medical history of castration-resistant prostate cancer, status post radiation therapy, was on chemotherapy with Ellie Lunch which is now on hold, recent L5 tumor with epidural extension and resultant radiculopathy status post T6-T7 laminectomy and resection of epidural tumor, left L5 decompressive hemilaminectomy with resection of epidural tumor on 03/13/2013 who presented to [facility] ED 03/29/2013 with progressively worsening lower extremity weakness over past few days prior to this admission. 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 Pt now s/p T4/T5 laminectomy and resection of epidural tumor.   Clinical Impression   Pt admitted with above.  Pt will benefit from continued acute OT services to address below problem list.  Recommending CIR to further progress rehab and maximize independence with ADLs and functional mobility prior to eventual return home.   OT Assessment  Patient needs continued OT Services    Follow Up Recommendations  CIR    Barriers to Discharge      Equipment Recommendations  None recommended by OT    Recommendations for Other Services Rehab consult  Frequency  Min 2X/week    Precautions / Restrictions Precautions Precautions: Fall;Back Precaution Booklet Issued: Yes (comment) Precaution Comments: able to recall 3/3 precautions   Pertinent Vitals/Pain See vitals   ADL  Eating/Feeding: Performed;Independent Where Assessed - Eating/Feeding: Bed level Upper Body Dressing: Performed;Minimal assistance Where Assessed - Upper Body Dressing: Supported sitting Lower Body Dressing: Performed;+1 Total assistance Where Assessed  - Lower Body Dressing: Supported sitting Toilet Transfer: Performed;+2 Total assistance Toilet Transfer: Patient Percentage: 40% Statistician Method: Surveyor, minerals: Materials engineer and Hygiene: Performed;Supervision/safety Where Assessed - Engineer, mining and Hygiene: Sit on 3-in-1 or toilet Equipment Used: Gait belt;Rolling walker Transfers/Ambulation Related to ADLs: +2 total assist for sit<>stand and for SPT from bed<>3n1.  Pt requriring assist due to bil LE weakness and decreased proprioception and sensation in LEs.  When attempting to transfer from 3n1<>chair, pt's RLE extending back under 3n1 (unsure if this is tone vs poor proprioception), RN then arrived to assist.  Pt stood with +2 total assist from 3n1 with PT/OT while RN removed 3n1 and placed chair behind pt. ADL Comments: Pt able to perform toileting hygiene while sitting on 3n1 with setup/supervision.  Increased assist for LB ADLs and functional mobility due to LE weakness and sitting balance deficit.    OT Diagnosis: Generalized weakness;Acute pain;Paresis  OT Problem List: Decreased strength;Decreased activity tolerance;Impaired balance (sitting and/or standing);Decreased knowledge of use of DME or AE;Pain;Impaired sensation OT Treatment Interventions: Self-care/ADL training;DME and/or AE instruction;Therapeutic activities;Patient/family education;Balance training   OT Goals(Current goals can be found in the care plan section) Acute Rehab OT Goals Patient Stated Goal: to be independent at home  OT Goal Formulation: With patient Time For Goal Achievement: 04/14/13 Potential to Achieve Goals: Good  Visit Information  Last OT Received On: 03/31/13 Assistance Needed: +1 PT/OT Co-Evaluation/Treatment: Yes History of Present Illness: Pt with significant past medical history of castration-resistant prostate cancer, status post radiation therapy, was on  chemotherapy with Ellie Lunch which is now on hold, recent L5 tumor with epidural extension and resultant radiculopathy status post T6-T7 laminectomy and resection of epidural tumor, left L5 decompressive hemilaminectomy with resection of epidural  tumor on 03/13/2013 who presented to [facility] ED 03/29/2013 with progressively worsening lower extremity weakness over past few days prior to this admission. 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 Pt now s/p T4/T5 laminectomy and resection of epidural tumor.       Prior Functioning     Home Living Family/patient expects to be discharged to:: Private residence Living Arrangements: Spouse/significant other Available Help at Discharge: Available 24 hours/day;Family Type of Home: House Home Access: Stairs to enter [employer] of Steps: 3 Entrance Stairs-Rails: Right Home Layout: Two level;Bed/bath upstairs Alternate Level Stairs-Number of Steps: 12 Alternate Level Stairs-Rails: Can reach both Home Equipment: Shower seat;Bedside commode;Other (comment);Walker - 2 wheels;Adaptive equipment Adaptive Equipment: Reacher;Sock aid;Long-handled shoe horn Prior Function Level of Independence: Independent Communication Communication: No difficulties         Vision/Perception     Cognition  Cognition Arousal/Alertness: Awake/alert Behavior During Therapy: WFL for tasks assessed/performed Overall Cognitive Status: Within Functional Limits for tasks assessed    Extremity/Trunk Assessment       Mobility Bed Mobility Bed Mobility: Rolling Left;Left Sidelying to Sit;Sitting - Scoot to Edge of Bed Rolling Left: 4: Min assist Left Sidelying to Sit: 2: Max assist;HOB flat Sitting - Scoot to [place] of Bed: 4: Min assist Details for Bed Mobility Assistance: VCs for technqiue.  Assist to support trunk transition OOB.  Transfers Transfers: Sit to Stand;Stand to  Sit Sit to Stand: 1: +2 Total assist;From bed;From chair/3-in-1;With upper extremity assist;With armrests Sit to Stand: Patient Percentage: 40% Stand to Sit: 1: +2 Total assist;To chair/3-in-1;With armrests;With upper extremity assist Stand to Sit: Patient Percentage: 40% Details for Transfer Assistance: +2 total assist to block bil knees to prevent buckling and to assist with power up.  Pt also requiring manual assist to prevent RLE from extending back underneath him. Max verbal cueing for safe hand placement.     Exercise     Balance Balance Balance Assessed: Yes Static Sitting Balance Static Sitting - Balance Support: No upper extremity supported;Feet supported;Bilateral upper extremity supported Static Sitting - Level of Assistance: 5: Stand by assistance;4: Min assist Static Sitting - Comment/# of Minutes: Without UE assist, pt requiring min assist to prevent posterior lean.  Pt able to stablize himself with bil UEs on armrests of 3n1/chair for balance with stand by assist.   End of Session OT - End of Session Equipment Utilized During Treatment: Gait belt;Rolling walker Activity Tolerance: Patient tolerated treatment well Patient left: in chair;with call bell/phone within reach;with nursing/sitter in room Nurse Communication: Mobility status  GO    03/31/2013 Cipriano Mile OTR/L Pager [phone] Office [phone]  Cipriano Mile 03/31/2013, 10:48 AM

## 2013-03-31 NOTE — Progress Notes (Signed)
Events noted. MRI results showed progression on his epidural tumor at the T4-5.  He underwent a laminectomy for tumor on 8/21 without complications.  Plans for post up radiation once healed and recovery is complete.  I will arrange follow up at the cancer center to address future systemic therapy.

## 2013-03-31 NOTE — Progress Notes (Signed)
Postop day 1.  Patient's pain recently well-controlled. He states his legs feel better.  Afebrile. Her rate and blood pressure stable. Urine output good. Awake and alert. Oriented and appropriate. Motor examination 5/5 bilateral upper extremities.Lower extremities strength 4/5 proximally 5/5 distally, much improved from preop. Dressing clean and dry. Chest and abdomen benign.  Significantly improved following decompression and tumor resection. Continue steroids. Transfer to floor. Began radiation therapy the lumbar spine as soon as Dr. Kathrynn Running feels it is appropriate.

## 2013-03-31 NOTE — Telephone Encounter (Signed)
printed appt sched and avs with letter...mailed to pt.

## 2013-04-01 LAB — GLUCOSE, CAPILLARY: Glucose-Capillary: 183 mg/dL — ABNORMAL HIGH (ref 70–99)

## 2013-04-01 LAB — COMPREHENSIVE METABOLIC PANEL
AST: 663 U/L — ABNORMAL HIGH (ref 0–37)
CO2: 24 mEq/L (ref 19–32)
Calcium: 9.3 mg/dL (ref 8.4–10.5)
Creatinine, Ser: 0.59 mg/dL (ref 0.50–1.35)
GFR calc Af Amer: >90 mL/min (ref >90)
GFR calc non Af Amer: >90 mL/min (ref >90)
Total Protein: 5.6 g/dL — ABNORMAL LOW (ref 6.0–8.3)

## 2013-04-01 LAB — CBC WITH DIFFERENTIAL/PLATELET
Eosinophils Absolute: 0 10*3/uL (ref 0.0–0.7)
Eosinophils Relative: 0 % (ref 0–5)
Lymphs Abs: 0.4 10*3/uL — ABNORMAL LOW (ref 0.7–4.0)
MCH: 26.7 pg (ref 26.0–34.0)
MCHC: 33.3 g/dL (ref 30.0–36.0)
MCV: 80.2 fL (ref 78.0–100.0)
Monocytes Absolute: 0.7 10*3/uL (ref 0.1–1.0)
Platelets: 128 10*3/uL — ABNORMAL LOW (ref 150–400)
RBC: 3.48 MIL/uL — ABNORMAL LOW (ref 4.22–5.81)
RDW: 18.4 % — ABNORMAL HIGH (ref 11.5–15.5)

## 2013-04-01 NOTE — Progress Notes (Signed)
Patient ID: Andrew Nelson, male   DOB: Jan 04, 1951, 62 y.o.   MRN: [id number] BP 147/65  Pulse 69  Temp(Src) 98.2 F (36.8 C) (Oral)  Resp 11  Ht 5\' 7"  (1.702 m)  Wt 73 kg (160 lb 15 oz)  BMI 25.2 kg/m2  SpO2 98% Alert and oriented x 4 Speech is clear and fluent Strength is improved in the lower extremities Wound is clean, dry, and without signs of infection.

## 2013-04-01 NOTE — Progress Notes (Signed)
Discussed with pt's RN.  Pt for possible transfer to [facility] for radiotherapy noted.  CSW will continue to follow pt for d/c planning.

## 2013-04-02 LAB — CBC WITH DIFFERENTIAL/PLATELET
Eosinophils Relative: 0 % (ref 0–5)
HCT: 28.5 % — ABNORMAL LOW (ref 39.0–52.0)
Lymphocytes Relative: 6 % — ABNORMAL LOW (ref 12–46)
Lymphs Abs: 0.5 10*3/uL — ABNORMAL LOW (ref 0.7–4.0)
MCV: 80.3 fL (ref 78.0–100.0)
Monocytes Absolute: 0.2 10*3/uL (ref 0.1–1.0)
Monocytes Relative: 3 % (ref 3–12)
RBC: 3.55 MIL/uL — ABNORMAL LOW (ref 4.22–5.81)
WBC: 8.1 10*3/uL (ref 4.0–10.5)

## 2013-04-02 LAB — COMPREHENSIVE METABOLIC PANEL
ALT: 31 U/L (ref 0–53)
BUN: 26 mg/dL — ABNORMAL HIGH (ref 6–23)
CO2: 24 mEq/L (ref 19–32)
Calcium: 9.2 mg/dL (ref 8.4–10.5)
Creatinine, Ser: 0.5 mg/dL (ref 0.50–1.35)
GFR calc Af Amer: >90 mL/min (ref >90)
GFR calc non Af Amer: >90 mL/min (ref >90)
Glucose, Bld: 203 mg/dL — ABNORMAL HIGH (ref 70–99)

## 2013-04-02 LAB — GLUCOSE, CAPILLARY: Glucose-Capillary: 167 mg/dL — ABNORMAL HIGH (ref 70–99)

## 2013-04-02 MED ORDER — PANTOPRAZOLE SODIUM 40 MG PO TBEC
40.0000 mg | DELAYED_RELEASE_TABLET | Freq: Every day | ORAL | Status: DC
  Administered 2013-04-02 – 2013-04-04 (×3): 40 mg via ORAL
  Filled 2013-04-02 (×4): qty 1

## 2013-04-02 MED ORDER — SODIUM CHLORIDE 0.9 % IJ SOLN
10.0000 mL | INTRAMUSCULAR | Status: DC | PRN
  Administered 2013-04-02: 10 mL

## 2013-04-02 NOTE — Significant Event (Signed)
Received call from Dr. Mikal Plane, on this patient that we admitted 03/29/2013 who will need to be transferred over for semi-urgent radiotherapy her Dr. Briant Cedar. Patient accepted MedSurg bed  Pleas Koch, MD [facility] [phone]

## 2013-04-02 NOTE — Progress Notes (Signed)
Transferred orders received and patient was transferred to [facility] 1303.  Patient transferred via [facility].  Report given to [facility] and receiving nurse, Cooper Render.   Ellene Route 04/02/2013

## 2013-04-02 NOTE — Progress Notes (Signed)
Patient came from [facility], via ambulance,alert and orientedx4,placed comfortably in bed.- Hulda Marin RN

## 2013-04-02 NOTE — Progress Notes (Signed)
Patient ID: Andrew Nelson, male   DOB: 1951/02/18, 62 y.o.   MRN: [id number] BP 120/71  Pulse 68  Temp(Src) 97.9 F (36.6 C) (Oral)  Resp 20  Ht 5' 7.5" (1.715 m)  Wt 76.4 kg (168 lb 6.9 oz)  BMI 25.98 kg/m2  SpO2 98% Alert and oriented x 4 Wound is clean, dry, and without signs of infection Moving lower extremities better Will transfer today to the hospitalist service in anticipation of radiation treatment tomorrow for lumbar spine.

## 2013-04-03 ENCOUNTER — Ambulatory Visit
Admit: 2013-04-03 | Discharge: 2013-04-03 | Disposition: A | Discharge status: Home / Self Care | Payer: BC Managed Care – PPO | Attending: Radiation Oncology | Admitting: Radiation Oncology

## 2013-04-03 DIAGNOSIS — C801 Malignant (primary) neoplasm, unspecified: Secondary | ICD-10-CM

## 2013-04-03 DIAGNOSIS — C50919 Malignant neoplasm of unspecified site of unspecified female breast: Secondary | ICD-10-CM

## 2013-04-03 DIAGNOSIS — C7949 Secondary malignant neoplasm of other parts of nervous system: Secondary | ICD-10-CM

## 2013-04-03 DIAGNOSIS — E109 Type 1 diabetes mellitus without complications: Secondary | ICD-10-CM

## 2013-04-03 LAB — TYPE AND SCREEN
ABO/RH(D): O POS
Antibody Screen: NEGATIVE
Unit division: 0

## 2013-04-03 LAB — CBC WITH DIFFERENTIAL/PLATELET
Eosinophils Absolute: 0 10*3/uL (ref 0.0–0.7)
Hemoglobin: 8.6 g/dL — ABNORMAL LOW (ref 13.0–17.0)
Lymphs Abs: 0.5 10*3/uL — ABNORMAL LOW (ref 0.7–4.0)
MCH: 26.4 pg (ref 26.0–34.0)
Monocytes Relative: 5 % (ref 3–12)
Neutrophils Relative %: 88 % — ABNORMAL HIGH (ref 43–77)
RBC: 3.26 MIL/uL — ABNORMAL LOW (ref 4.22–5.81)

## 2013-04-03 LAB — GLUCOSE, CAPILLARY
Glucose-Capillary: 142 mg/dL — ABNORMAL HIGH (ref 70–99)
Glucose-Capillary: 174 mg/dL — ABNORMAL HIGH (ref 70–99)
Glucose-Capillary: 154 mg/dL — ABNORMAL HIGH (ref 70–99)
Glucose-Capillary: 178 mg/dL — ABNORMAL HIGH (ref 70–99)

## 2013-04-03 LAB — COMPREHENSIVE METABOLIC PANEL
Alkaline Phosphatase: 234 U/L — ABNORMAL HIGH (ref 39–117)
BUN: 27 mg/dL — ABNORMAL HIGH (ref 6–23)
CO2: 27 mEq/L (ref 19–32)
Chloride: 100 mEq/L (ref 96–112)
GFR calc Af Amer: >90 mL/min (ref >90)
GFR calc non Af Amer: >90 mL/min (ref >90)
Glucose, Bld: 150 mg/dL — ABNORMAL HIGH (ref 70–99)
Potassium: 4 mEq/L (ref 3.5–5.1)
Total Bilirubin: 0.3 mg/dL (ref 0.3–1.2)
Total Protein: 5.3 g/dL — ABNORMAL LOW (ref 6.0–8.3)

## 2013-04-03 NOTE — Progress Notes (Signed)
[facility] PROGRESS NOTE  Andrew Nelson ZOX:[id number] DOB: [birth year] DOA: 03/29/2013 PCP: [name], MD  Brief narrative: 62 y.o. year old male with significant past medical history of castration-resistant prostate cancer, status post radiation therapy, was on chemotherapy with Ellie Lunch which is now on hold, recent L5 tumor with epidural extension and resultant radiculopathy status post T6-T7 laminectomy and resection of epidural tumor, left L5 decompressive hemilaminectomy with resection of epidural tumor on 03/13/2013 who presented to [facility] ED 03/29/2013 with progressively worsening lower extremity weakness over past few days prior to this admission.  In ED, his vital signs were stable. CBC revealed a white blood cell count of 14 and hemoglobin of 12.3. BMP revealed a sodium of 134, normal kidney function. 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  - based on MRI findings of lumbar spine; doing better (however still with significant pain)  After decompression and tumor resection (pt was in [place] from 8/21 until 04/02/2013) - plan for PT evaluation and likely SNF placement versus inpatient rehab - continue decadron 4 mg IV every 6 hours, Toradol 30 mg every 6 hours IV - continue norco PO PRN moderate pain and dilaudid 1 mg every 2 hours IV PRN severe pain - Appreciate radiation oncology following  Active Problems:  DM w/o Complication Type I  - CBGs in past 24 hours are 149, 142, 174 - Continue  insulin 70/30 mixed 20 units at bedtime  - Continue NovoLog sliding scale  Prostate cancer with spinal metastasis  - Management per oncology; now receiving RT to lumbar area - Continue Decadron 4 mg every 6 hours IV  - continue doxazosin 8 mg daily  Hypertension  - Continue HCTZ 25 mg daily and lisinopril 10 mg daily  Anemia of chronic disease  - Secondary to history of prostate cancer  - Hemoglobin stable. No indications for transfusion  - hemoglobin range 8.6 - 9.4 Hyponatremia  - Likely secondary to dehydration  - sodium ranges 132 - 135  Code Status: full code  Family Communication: no family at the bedside  Disposition Plan: rehab/SNF when stable  [name], MD  [facility]  Pager [phone]    Consultants:  Oncology ([name])  Radiation oncology (Dr. Kathrynn Running) Neurosurgery Procedures:  Laminectomy and tumor resection 03/30/2013 (Dr. Dutch Quint of neurosurgery) Antibiotics:  None    If 7PM-7AM, please contact night-coverage [email or web address] Password [facility] 04/03/2013, 7:26 AM   LOS: 5 days    HPI/Subjective: No acute overnight events.   Objective: Filed Vitals:   04/02/13 2102 04/03/13 0209 04/03/13 0435 04/03/13 0624  BP: 118/60 132/62 124/63   Pulse: 74 70 63   Temp: 98.4 F (36.9 C) 97.4 F (36.3 C) 97.9 F (36.6 C)   TempSrc: Oral Oral Oral   Resp: 18 16 16    Height:      Weight:    71.714 kg (158 lb 1.6 oz)  SpO2: 99% 100% 100%     Intake/Output Summary (Last 24 hours) at 04/03/13 0726 Last data filed at 04/03/13 0547  Gross per 24 hour  Intake 488.67  ml  Output    425 ml  Net  63.67 ml    Exam:   General:  Pt is alert, follows commands appropriately, not in acute distress  Cardiovascular: Regular rate and rhythm, S1/S2, no murmurs, no rubs, no gallops  Respiratory: Clear to auscultation bilaterally, no wheezing, no crackles, no rhonchi  Abdomen: Soft, non tender, non distended, bowel sounds present, no guarding  Extremities: No edema, pulses  DP and PT palpable bilaterally  Neuro: Grossly nonfocal  Data Reviewed: Basic Metabolic Panel:  Recent Labs Lab 03/29/13 1618 03/30/13 0600 04/01/13 0555 04/02/13 0900 04/03/13 0430  NA 134* 134* 135 133* 132*  K 4.4 3.9 3.8 3.7 4.0  CL 96 99 102 100 100  CO2 22 27 24 24 27   GLUCOSE 123* 122* 186* 203* 150*  BUN 21 19 26* 26* 27*  CREATININE 0.50 0.47* 0.59 0.50 0.50  CALCIUM 9.8 9.6 9.3 9.2 8.8   Liver Function Tests:  Recent Labs Lab 03/29/13 1618 03/30/13 0600 04/01/13 0555 04/02/13 0900 04/03/13 0430  AST 38* 36 663* 60* 34  ALT 23 19 70* 31 40  ALKPHOS 125* 120* 332* 277* 234*  BILITOT 0.4 0.5 0.5 0.5 0.3  PROT 6.8 6.1 5.6* 5.8* 5.3*  ALBUMIN 3.2* 2.8* 2.5* 2.5* 2.2*   No results found for this basename: LIPASE, AMYLASE,  in the last 168 hours No results found for this basename: AMMONIA,  in the last 168 hours CBC:  Recent Labs Lab 03/29/13 1557 03/30/13 0600 04/01/13 0555 04/02/13 0900 04/03/13 0430  WBC 14.0* 10.0 9.7 8.1 7.6  NEUTROABS 12.5* 9.0* 8.6* 7.5 6.7  HGB 12.3* 11.0* 9.3* 9.4* 8.6*  HCT 38.2* 33.5* 27.9* 28.5* 26.3*  MCV 81.6 81.5 80.2 80.3 80.7  PLT Clumped Platelets--Appears Adequate 156 128* 154 161   Cardiac Enzymes: No results found for this basename: CKTOTAL, CKMB, CKMBINDEX, TROPONINI,  in the last 168 hours BNP: No components found with this basename: POCBNP,  CBG:  Recent Labs Lab 04/01/13 2250 04/02/13 0635 04/02/13 1151 04/02/13 1746 04/02/13 2155  GLUCAP 183* 146* 172* 167* 149*    Recent Results (from the past 240 hour(s))  TECHNOLOGIST REVIEW     Status: None   Collection Time    03/29/13  3:57 PM      Result Value Range Status   Technologist Review few shistocytes, few helmets, few teardrops   Final  SURGICAL PCR SCREEN     Status: None   Collection Time    03/30/13  4:39 PM      Result Value Range Status   MRSA, PCR NEGATIVE  NEGATIVE Final   Staphylococcus aureus NEGATIVE  NEGATIVE Final   Comment:             The Xpert SA Assay (FDA     approved for NASAL specimens     in patients over 7 years of age),     is one component of     a comprehensive surveillance     program.  Test performance has     been validated by [employer] for patients greater     than or equal to 70 year old.     It is not intended     to diagnose infection nor to     guide or monitor treatment.     Studies: No results found.  Scheduled Meds: . dexamethasone  4 mg Intravenous Q6H   Or  . dexamethasone  4 mg Oral  Q6H  . doxazosin  8 mg Oral QHS  . fentaNYL  50 mcg Transdermal Q72H  . fentaNYL  25 mcg Transdermal Q72H  . heparin  5,000 Units Subcutaneous Q8H  . hydrochlorothiazide  25 mg Oral Daily  . insulin aspart  0-20 Units Subcutaneous TID WC  . insulin aspart protamine- aspart  20 Units Subcutaneous Q supper  . ketorolac  30 mg Intravenous Q6H  . lisinopril  10 mg Oral Daily  . mupirocin ointment   Nasal BID  . pantoprazole  40 mg Oral QHS  . senna  1 tablet Oral BID  . sodium chloride  3 mL Intravenous Q12H   Continuous Infusions: . sodium chloride 75 mL/hr at 03/30/13 1011  . sodium chloride 250 mL (03/31/13 0800)

## 2013-04-03 NOTE — Progress Notes (Signed)
Occupational Therapy Treatment Patient Details Name: Andrew Nelson MRN: [id number] DOB: 08/27/1950 Today's Date: 04/03/2013    OT Assessment / Plan / Recommendation  History of present illness Pt with significant past medical history of castration-resistant prostate cancer, status post radiation therapy, was on chemotherapy with Ellie Lunch which is now on hold, recent L5 tumor with epidural extension and resultant radiculopathy status post T6-T7 laminectomy and resection of epidural tumor, left L5 decompressive hemilaminectomy with resection of epidural tumor on 03/13/2013 who presented to [facility] ED 03/29/2013 with progressively worsening lower extremity weakness over past few days prior to this admission. 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 Pt now s/p T4/T5 laminectomy and resection of epidural tumor.                  Frequency Min 2X/week   Progress towards OT Goals Progress towards OT goals: Not progressing toward goals - comment (pain  limiting today)  Plan Discharge plan remains appropriate    Precautions / Restrictions Precautions Precautions: Fall;Back Precaution Booklet Issued: Yes (comment) Precaution Comments: able to recall 3/3 precautions Restrictions Weight Bearing Restrictions: No       ADL  ADL Comments: Pt in bed complaining of pain. OT provided education on repositioning in bed to decrease pain. Pt educated on supportive position in side lying, as well as supine.  Pt declined further mobility at this time due to pain.        OT Goals(current goals can now be found in the care plan section)    Visit Information  Last OT Received On: 04/03/13 History of Present Illness: Pt with significant past medical history of castration-resistant prostate cancer, status post radiation therapy, was on chemotherapy with Ellie Lunch which is now on hold, recent L5 tumor with epidural extension and  resultant radiculopathy status post T6-T7 laminectomy and resection of epidural tumor, left L5 decompressive hemilaminectomy with resection of epidural tumor on 03/13/2013 who presented to [facility] ED 03/29/2013 with progressively worsening lower extremity weakness over past few days prior to this admission. 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 Pt now s/p T4/T5 laminectomy and resection of epidural tumor.          Cognition  Cognition Arousal/Alertness: Awake/alert Behavior During Therapy: WFL for tasks assessed/performed Overall Cognitive Status: Within Functional Limits for tasks assessed             End of Session OT - End of Session Activity Tolerance: Patient limited by fatigue;Patient limited by pain Patient left: in bed;with call bell/phone within reach;with family/visitor present  GO     Keric Zehren, [place] Kung 04/03/2013, 11:40 AM

## 2013-04-03 NOTE — Progress Notes (Signed)
Clinical Social Work Department BRIEF PSYCHOSOCIAL ASSESSMENT 04/03/2013  Patient:  Andrew Nelson, Andrew Nelson     Account Number:  [id number]     Admit date:  03/29/2013  Clinical Social Worker:  Jacelyn Grip  Date/Time:  04/03/2013 10:30 AM  Referred by:  Physician  Date Referred:  04/03/2013 Referred for  SNF Placement   Other Referral:   Interview type:  Patient Other interview type:   and patient wife at bedside    PSYCHOSOCIAL DATA Living Status:  WIFE Admitted from facility:   Level of care:   Primary support name:  Andrew Nelson/wife/([phone]) [phone] Primary support relationship to patient:  SPOUSE Degree of support available:   strong    CURRENT CONCERNS Current Concerns  Post-Acute Placement   Other Concerns:    SOCIAL WORK ASSESSMENT / PLAN CSW received referral for short term SNF placement.    CSW reviewed chart and noted that likely d/c plan is for CIR vs SNF.    CSW met with pt and pt wife at bedside to discuss.    CSW introduced self and explained role. CSW discussed SNF process as secondary option to CIR. Pt agreeable to SNF search in [facility]. Pt reports that he began radiation today and is unsure of what the treatment plan is for radiation because it had to be specialized for him.    CSW completed FL2 and initiated SNF search to [facility].    CSW to follow up with pt and pt wife re: bed offers.    CSW to follow up with decision from CIR    CSW to continue to follow and facilitate pt d/c needs when pt medically stable for discharge and insurance authorization approved.   Assessment/plan status:  Psychosocial Support/Ongoing Assessment of Needs Other assessment/ plan:   discharge planning   Information/referral to community resources:   [facility] list.    PATIENT'S/FAMILY'S RESPONSE TO PLAN OF CARE: Pt alert and oriented x 4. Pt wife appears supportive and actively involved in pt care. Pt agreeable to exploring SNF options as a  potential disposition plan.     Andrew Nelson, MSW, LCSWA  Clinical Social Work [phone]

## 2013-04-03 NOTE — Progress Notes (Signed)
  Radiation Oncology         [phone]) [phone] ________________________________  Name: Andrew Nelson MRN: [id number]  Date: 04/03/2013  DOB: [birth year]  INPATIENT  SIMULATION AND TREATMENT PLANNING NOTE  DIAGNOSIS:  62 year old gentleman with castration resistant prostate cancer and metastatic involvement of the lumbar spine  NARRATIVE:  The patient was brought to the CT Simulation planning suite.  Identity was confirmed.  All relevant records and images related to the planned course of therapy were reviewed.  The patient freely provided informed written consent to proceed with treatment after reviewing the details related to the planned course of therapy. The consent form was witnessed and verified by the simulation staff.  Then, the patient was set-up in a stable reproducible  supine position for radiation therapy.  CT images were obtained.  Surface markings were placed.  The CT images were loaded into the planning software.  Then the target and avoidance structures were contoured.  Treatment planning then occurred.  The radiation prescription was entered and confirmed.  Then, I designed and supervised the construction of a total of 2 medically necessary complex treatment devices in the form of multileaf collimator aperture shaping radiation around the T12 through the sacroiliac joint region of the spine in order to adequately cover metastatic deposits at L1-L3 and L5.  I have requested : Isodose Plan.   SPECIAL TREATMENT PROCEDURE:  The patient's previous pelvic lymph node palliative treatment abuts the lower edge of the current radiation field This treatment constitutes a Special Treatment Procedure for the following reason: [ Retreatment in a previously radiated area requiring careful monitoring of increased risk of toxicity due to overlap of previous treatment.. The special nature of the planned course of radiotherapy will require increased physician supervision and oversight to ensure patient's  safety with optimal treatment outcomes.  PLAN:  The patient will receive 30 Gy in 10 fraction starting later today on an urgent basis.  ________________________________  [name], M.D.

## 2013-04-03 NOTE — Progress Notes (Signed)
  Radiation Oncology         [phone] ________________________________  Name: Andrew Nelson MRN: [id number]  Date: 04/03/2013  DOB: 1951/03/13  Simulation Verification Note  Status: inpatient  NARRATIVE: The patient was brought to the treatment unit and placed in the planned treatment position. The clinical setup was verified. Then port films were obtained and uploaded to the radiation oncology medical record software.  The treatment beams were carefully compared against the planned radiation fields. The position location and shape of the radiation fields was reviewed. They targeted volume of tissue appears to be appropriately covered by the radiation beams. Organs at risk appear to be excluded as planned.  Based on my personal review, I approved the simulation verification. The patient's treatment will proceed as planned.  ------------------------------------------------  [name], M.D.

## 2013-04-03 NOTE — Progress Notes (Signed)
PT Cancellation Note  Patient Details Name: Andrew Nelson MRN: [id number] DOB: [birth year]   Cancelled Treatment:    Reason Eval/Treat Not Completed: Pain limiting ability to participate.  Pt now at [facility].  He is undergoing initiation of radiation treatment today and is in too much pain to participate with PT today.  Will check back tomorrow   Donnetta Hail 04/03/2013, 2:03 PM

## 2013-04-03 NOTE — Progress Notes (Addendum)
Clinical Social Work Department CLINICAL SOCIAL WORK PLACEMENT NOTE 04/03/2013  Patient:  Andrew Nelson, Andrew Nelson  Account Number:  [id number] Admit date:  03/29/2013  Clinical Social Worker:  Jacelyn Grip  Date/time:  04/03/2013 11:00 AM  Clinical Social Work is seeking post-discharge placement for this patient at the following level of care:   SKILLED NURSING   (*CSW will update this form in Epic as items are completed)   04/03/2013  Patient/family provided with [facility] Department of Clinical Social Work's list of facilities offering this level of care within the geographic area requested by the patient (or if unable, by the patient's family).  04/03/2013  Patient/family informed of their freedom to choose among providers that offer the needed level of care, that participate in Medicare, Medicaid or managed care program needed by the patient, have an available bed and are willing to accept the patient.  04/03/2013  Patient/family informed of [facility]' ownership interest in [facility], as well as of the fact that they are under no obligation to receive care at this facility.  PASARR submitted to EDS on 04/03/2013 PASARR number received from EDS on 04/03/2013  FL2 transmitted to all facilities in geographic area requested by pt/family on  04/03/2013 FL2 transmitted to all facilities within larger geographic area on   Patient informed that his/her managed care company has contracts with or will negotiate with  certain facilities, including the following:     Patient/family informed of bed offers received:  04/04/2013 Patient chooses bed at [facility] Inpatient Rehab Physician recommends and patient chooses bed at    Patient to be transferred to  on  [facility]patient Rehab on 04/05/2013 Patient to be transferred to facility by [facility]  The following physician request were entered in Epic:   Additional Comments:    [name], MSW, [facility]  Clinical Social  Work [phone]

## 2013-04-04 ENCOUNTER — Ambulatory Visit
Admit: 2013-04-04 | Discharge: 2013-04-04 | Disposition: A | Discharge status: Home / Self Care | Payer: BC Managed Care – PPO | Attending: Radiation Oncology | Admitting: Radiation Oncology

## 2013-04-04 ENCOUNTER — Other Ambulatory Visit: Payer: Self-pay | Admitting: *Deleted

## 2013-04-04 DIAGNOSIS — G8220 Paraplegia, unspecified: Secondary | ICD-10-CM

## 2013-04-04 DIAGNOSIS — C720 Malignant neoplasm of spinal cord: Secondary | ICD-10-CM

## 2013-04-04 DIAGNOSIS — E440 Moderate protein-calorie malnutrition: Secondary | ICD-10-CM | POA: Diagnosis present

## 2013-04-04 LAB — COMPREHENSIVE METABOLIC PANEL
AST: 28 U/L (ref 0–37)
BUN: 33 mg/dL — ABNORMAL HIGH (ref 6–23)
CO2: 26 mEq/L (ref 19–32)
Calcium: 8.7 mg/dL (ref 8.4–10.5)
Creatinine, Ser: 0.57 mg/dL (ref 0.50–1.35)
GFR calc Af Amer: >90 mL/min (ref >90)
GFR calc non Af Amer: >90 mL/min (ref >90)
Total Bilirubin: 0.3 mg/dL (ref 0.3–1.2)

## 2013-04-04 LAB — CBC WITH DIFFERENTIAL/PLATELET
Basophils Absolute: 0 10*3/uL (ref 0.0–0.1)
Eosinophils Relative: 0 % (ref 0–5)
HCT: 25.6 % — ABNORMAL LOW (ref 39.0–52.0)
Hemoglobin: 8.3 g/dL — ABNORMAL LOW (ref 13.0–17.0)
Lymphocytes Relative: 6 % — ABNORMAL LOW (ref 12–46)
MCHC: 32.4 g/dL (ref 30.0–36.0)
MCV: 80.8 fL (ref 78.0–100.0)
Monocytes Absolute: 0.4 10*3/uL (ref 0.1–1.0)
Monocytes Relative: 6 % (ref 3–12)
RDW: 18.3 % — ABNORMAL HIGH (ref 11.5–15.5)
WBC: 7.6 10*3/uL (ref 4.0–10.5)

## 2013-04-04 LAB — GLUCOSE, CAPILLARY

## 2013-04-04 NOTE — Progress Notes (Signed)
Rehab admissions - Evaluated for possible admission.  I will contact insurance carrier and begin authorization process for acute inpatient rehab admission.  Call me for questions.  #[phone]

## 2013-04-04 NOTE — Progress Notes (Signed)
Physical Therapy Treatment Patient Details Name: Andrew Nelson MRN: [id number] DOB: 05/21/1951 Today's Date: 04/04/2013 Time: 4098-1191 PT Time Calculation (min): 31 min  PT Assessment / Plan / Recommendation  History of Present Illness pt began radiation therapy 04/03/13   PT Comments   Pt is using Stedy for functional assist to get to the bathroom.  Pt able to activate LE and core  muscles and  core muscles to initiate activity, but demonstrates decreased endurance  Follow Up Recommendations  CIR;Supervision/Assistance - 24 hour     Does the patient have the potential to tolerate intense rehabilitation     Barriers to Discharge        Equipment Recommendations  None recommended by PT    Recommendations for Other Services Rehab consult  Frequency Min 5X/week   Progress towards PT Goals Progress towards PT goals: Progressing toward goals  Plan Current plan remains appropriate    Precautions / Restrictions Precautions Precautions: Fall;Back Restrictions Weight Bearing Restrictions: No   Pertinent Vitals/Pain Pt c/o pain in back with fatigue    Mobility  Bed Mobility Bed Mobility: Rolling Right;Supine to Sit;Right Sidelying to Sit Rolling Right: 4: Min assist Right Sidelying to Sit: 3: Mod assist Details for Bed Mobility Assistance: pt able to initiate mobility but needs assist to complete task  Transfers Transfers: Sit to Stand;Stand to Sit Sit to Stand: 1: +2 Total assist;From bed;Without upper extremity assist Stand to Sit: 1: +2 Total assist Stand to Sit: Patient Percentage: 40% Details for Transfer Assistance: pt needs assist to lift off bed.  He is able to complete extension to stand with [facility] for tranfer Pt directs transfer and guides others how to help him . Pt stood on [place], wheeled to bathroom for bm, stood and returned to chair.  Ambulation/Gait Ambulation/Gait Assistance: Not tested (comment) (Pt using stedy for transfer assist)    Exercises General  Exercises - Lower Extremity Ankle Circles/Pumps: AROM;Both;5 reps;Seated Gluteal Sets: AROM;Both;5 reps;Standing Long Arc Quad: AROM;Both;5 reps;Seated   PT Diagnosis:    PT Problem List:   PT Treatment Interventions:     PT Goals (current goals can now be found in the care plan section)    Visit Information  Last PT Received On: 04/04/13 Assistance Needed: +2 History of Present Illness: pt began radiation therapy 04/03/13    Subjective Data      Cognition  Cognition Arousal/Alertness: Awake/alert Behavior During Therapy: WFL for tasks assessed/performed Overall Cognitive Status: Within Functional Limits for tasks assessed    Balance  Static Sitting Balance Static Sitting - Balance Support: No upper extremity supported;Feet supported Static Sitting - Level of Assistance: 5: Stand by assistance Static Sitting - Comment/# of Minutes: pt encouraged to extend upper trunk for core activation  End of Session PT - End of Session Equipment Utilized During Treatment: Gait belt Activity Tolerance: Patient tolerated treatment well Patient left: in chair;with call bell/phone within reach;with family/visitor present Nurse Communication: Mobility status;Need for lift equipment (stedy left in room)   GP    Rosey Bath K. [place], [place] [phone] 04/04/2013, 1:27 PM

## 2013-04-04 NOTE — Progress Notes (Signed)
CSW met with pt at bedside to provide SNF bed offers.   Pt wife not present at this time, but pt reports that she will return soon.  CSW discussed SNF bed offers with pt.   CSW discussed that [facility] consult is pending and it will be important to consider SNF bed offers to have in mind for if [facility] is unable to accept pt.  CSW contacted [facility] Inpatient Rehab RN, Roderic Palau who stated that [facility] Inpatient Rehab MD attempted to complete consult yesterday, but pt was in radiation and [facility] MD plans to complete consult today.  CSW to follow up with pt and pt wife re: disposition planning.   [name], MSW, LCSWA  Clinical Social Work [phone]

## 2013-04-04 NOTE — Progress Notes (Signed)
CSW received notification from [facility] Inpatient Rehab RN, Roderic Palau that consult completed and pt appropriate for [facility] Rehab and rehab RN, Roderic Palau plans to contact insurance carrier and begin authorization process for acute inpatient rehab admission.  CSW discussed with pt and pt wife at bedside. Pt preference is [facility] and pt hopeful that insurance carrier will approve admission to [facility].  CSW to continue to follow to assist with disposition planning as appropriate.   [name], MSW, LCSWA  Clinical Social Work [phone]

## 2013-04-04 NOTE — Progress Notes (Signed)
Late entry  Port flushed with 10 cc Normal saline and 500 units Heparin.

## 2013-04-04 NOTE — Progress Notes (Signed)
[facility] PROGRESS NOTE  Andrew Nelson ZOX:[id number] DOB: 1951-03-26 DOA: 03/29/2013 PCP: [name], MD  Brief narrative: 62 y.o. year old male with significant past medical history of castration-resistant prostate cancer, status post radiation therapy, was on chemotherapy with Ellie Lunch which is now on hold, recent L5 tumor with epidural extension and resultant radiculopathy status post T6-T7 laminectomy and resection of epidural tumor, left L5 decompressive hemilaminectomy with resection of epidural tumor on 03/13/2013 who presented to [facility] ED 03/29/2013 with progressively worsening lower extremity weakness over past few days prior to this admission.  In ED, his vital signs were stable. CBC revealed a white blood cell count of 14 and hemoglobin of 12.3. BMP revealed a sodium of 134, normal kidney function. 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 He returned to [facility] 04/02/13 to continue radiation therapy to T12 through sacroiliac region per radiation oncology. Social work is assisting discharge plan to rehab or SNF.  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  - based on MRI findings of lumbar spine; doing better after decompression and tumor resection (pt was in [place] from 8/21 until 04/02/2013)  - radiation therapy started 04/03/2013; tolerating well - continue decadron 4 mg IV every 6 hours, Toradol 30 mg every 6 hours IV  - continue norco PO PRN moderate pain and dilaudid 1 mg every 2 hours IV PRN severe pain  - Appreciate radiation oncology following  - follow up recommendations from inpatient rehab Active Problems:  DM w/o Complication Type I  - CBGs in past 24 hours  are 154, 178, 124 - Continue insulin 70/30 mixed 20 units at bedtime  - Continue NovoLog sliding scale  Prostate cancer with spinal metastasis  - Management per oncology; now receiving RT to lumbar area, started 04/03/2013 - Continue Decadron 4 mg every 6 hours IV  - continue doxazosin 8 mg daily  Hypertension  - Continue HCTZ 25 mg daily and lisinopril 10 mg daily  - BP 131/62 Anemia of chronic disease  - Secondary to history of prostate cancer  - Hemoglobin stable. No indications for transfusion  - hemoglobin range 8.3 - 8.6 over past 24 hours Hyponatremia  - Likely secondary to dehydration  - sodium ranges 132 - 133 Moderate protein calorie malnutrition - Secondary to history of spinal metastasis - Tolerates by mouth intake  Code Status: full code  Family Communication: no family at the bedside  Disposition Plan: rehab/SNF when stable   [name], MD  [facility]  Pager [phone]   Consultants:  Oncology ([name])  Radiation oncology (Dr. Kathrynn Running)  Neurosurgery Procedures:  Laminectomy and tumor resection 03/30/2013 (Dr. Dutch Quint of neurosurgery) Antibiotics:  None     If 7PM-7AM, please contact night-coverage [email or web address] Password [facility] 04/04/2013, 11:46 AM   LOS: 6 days   HPI/Subjective: Pain somewhat better this am. Attempted to walk to the bathroom with PT assistance.  Objective: Filed Vitals:   04/03/13 2145 04/04/13 0215 04/04/13 0620 04/04/13 1005  BP: 131/64 137/60 138/57 131/62  Pulse: 89 58 62 89  Temp: 98.6 F (37 C) 97.1 F (36.2 C) 98.2 F (36.8 C) 98 F (36.7 C)  TempSrc: Oral Oral Oral Oral  Resp: 20 20 20 16   Height:  Weight:      SpO2: 98% 99% 99% 98%    Intake/Output Summary (Last 24 hours) at 04/04/13 1146 Last data filed at 04/04/13 0944  Gross per 24 hour  Intake    962 ml  Output    650 ml  Net    312 ml    Exam:   General:  Pt is alert, follows commands appropriately, not in acute distress  Cardiovascular: Regular  rate and rhythm, S1/S2, no murmurs, no rubs, no gallops  Respiratory: Clear to auscultation bilaterally, no wheezing, no crackles, no rhonchi  Abdomen: Soft, non tender, non distended, bowel sounds present, no guarding  Extremities: Pulses DP and PT palpable bilaterally  Neuro: Grossly nonfocal  Data Reviewed: Basic Metabolic Panel:  Recent Labs Lab 03/30/13 0600 04/01/13 0555 04/02/13 0900 04/03/13 0430 04/04/13 0545  NA 134* 135 133* 132* 133*  K 3.9 3.8 3.7 4.0 4.2  CL 99 102 100 100 101  CO2 27 24 24 27 26   GLUCOSE 122* 186* 203* 150* 139*  BUN 19 26* 26* 27* 33*  CREATININE 0.47* 0.59 0.50 0.50 0.57  CALCIUM 9.6 9.3 9.2 8.8 8.7   Liver Function Tests:  Recent Labs Lab 03/30/13 0600 04/01/13 0555 04/02/13 0900 04/03/13 0430 04/04/13 0545  AST 36 663* 60* 34 28  ALT 19 70* 31 40 43  ALKPHOS 120* 332* 277* 234* 197*  BILITOT 0.5 0.5 0.5 0.3 0.3  PROT 6.1 5.6* 5.8* 5.3* 5.1*  ALBUMIN 2.8* 2.5* 2.5* 2.2* 2.2*   No results found for this basename: LIPASE, AMYLASE,  in the last 168 hours No results found for this basename: AMMONIA,  in the last 168 hours CBC:  Recent Labs Lab 03/30/13 0600 04/01/13 0555 04/02/13 0900 04/03/13 0430 04/04/13 0545  WBC 10.0 9.7 8.1 7.6 7.6  NEUTROABS 9.0* 8.6* 7.5 6.7 6.6  HGB 11.0* 9.3* 9.4* 8.6* 8.3*  HCT 33.5* 27.9* 28.5* 26.3* 25.6*  MCV 81.5 80.2 80.3 80.7 80.8  PLT 156 128* 154 161 196   Cardiac Enzymes: No results found for this basename: CKTOTAL, CKMB, CKMBINDEX, TROPONINI,  in the last 168 hours BNP: No components found with this basename: POCBNP,  CBG:  Recent Labs Lab 04/03/13 0752 04/03/13 1158 04/03/13 1757 04/03/13 2149 04/04/13 0735  GLUCAP 142* 174* 154* 178* 124*    TECHNOLOGIST REVIEW     Status: None   Collection Time    03/29/13  3:57 PM      Result Value Range Status   Technologist Review few shistocytes, few helmets, few teardrops   Final  SURGICAL PCR SCREEN     Status: None    Collection Time    03/30/13  4:39 PM      Result Value Range Status   MRSA, PCR NEGATIVE  NEGATIVE Final   Staphylococcus aureus NEGATIVE  NEGATIVE Final     Studies: No results found.  Scheduled Meds: . dexamethasone  4 mg Intravenous Q6H   Or  . dexamethasone  4 mg Oral Q6H  . doxazosin  8 mg Oral QHS  . fentaNYL  50 mcg Transdermal Q72H  . fentaNYL  25 mcg Transdermal Q72H  . heparin  5,000 Units Subcutaneous Q8H  . hydrochlorothiazide  25 mg Oral Daily  . insulin aspart  0-20 Units Subcutaneous TID WC  . insulin aspart protamine- aspart  20 Units Subcutaneous Q supper  . ketorolac  30 mg Intravenous Q6H  . lisinopril  10 mg Oral Daily  . pantoprazole  40 mg  Oral QHS  . senna  1 tablet Oral BID   Continuous Infusions: . sodium chloride 250 mL (03/31/13 0800)

## 2013-04-05 ENCOUNTER — Encounter (HOSPITAL_COMMUNITY): Payer: Self-pay

## 2013-04-05 ENCOUNTER — Inpatient Hospital Stay (HOSPITAL_COMMUNITY)
Admission: AD | Admit: 2013-04-05 | Discharge: 2013-04-18 | DRG: 462 | Disposition: A | Discharge status: Other Acute Inpatient Hospital | Payer: BC Managed Care – PPO | Source: Intra-hospital | Attending: Physical Medicine & Rehabilitation | Admitting: Physical Medicine & Rehabilitation

## 2013-04-05 ENCOUNTER — Ambulatory Visit
Admit: 2013-04-05 | Discharge: 2013-04-05 | Disposition: A | Discharge status: Home / Self Care | Payer: BC Managed Care – PPO | Attending: Radiation Oncology | Admitting: Radiation Oncology

## 2013-04-05 DIAGNOSIS — Z794 Long term (current) use of insulin: Secondary | ICD-10-CM

## 2013-04-05 DIAGNOSIS — G8220 Paraplegia, unspecified: Secondary | ICD-10-CM

## 2013-04-05 DIAGNOSIS — E1142 Type 2 diabetes mellitus with diabetic polyneuropathy: Secondary | ICD-10-CM | POA: Diagnosis present

## 2013-04-05 DIAGNOSIS — G893 Neoplasm related pain (acute) (chronic): Secondary | ICD-10-CM | POA: Diagnosis present

## 2013-04-05 DIAGNOSIS — Z809 Family history of malignant neoplasm, unspecified: Secondary | ICD-10-CM

## 2013-04-05 DIAGNOSIS — E1149 Type 2 diabetes mellitus with other diabetic neurological complication: Secondary | ICD-10-CM | POA: Diagnosis present

## 2013-04-05 DIAGNOSIS — C720 Malignant neoplasm of spinal cord: Secondary | ICD-10-CM

## 2013-04-05 DIAGNOSIS — Z5189 Encounter for other specified aftercare: Principal | ICD-10-CM

## 2013-04-05 DIAGNOSIS — R5381 Other malaise: Secondary | ICD-10-CM | POA: Diagnosis present

## 2013-04-05 DIAGNOSIS — I2510 Atherosclerotic heart disease of native coronary artery without angina pectoris: Secondary | ICD-10-CM | POA: Diagnosis present

## 2013-04-05 DIAGNOSIS — E871 Hypo-osmolality and hyponatremia: Secondary | ICD-10-CM

## 2013-04-05 DIAGNOSIS — D62 Acute posthemorrhagic anemia: Secondary | ICD-10-CM | POA: Diagnosis present

## 2013-04-05 DIAGNOSIS — I10 Essential (primary) hypertension: Secondary | ICD-10-CM | POA: Diagnosis present

## 2013-04-05 DIAGNOSIS — R29898 Other symptoms and signs involving the musculoskeletal system: Secondary | ICD-10-CM | POA: Diagnosis present

## 2013-04-05 DIAGNOSIS — C61 Malignant neoplasm of prostate: Secondary | ICD-10-CM

## 2013-04-05 DIAGNOSIS — R627 Adult failure to thrive: Secondary | ICD-10-CM | POA: Diagnosis present

## 2013-04-05 DIAGNOSIS — D638 Anemia in other chronic diseases classified elsewhere: Secondary | ICD-10-CM

## 2013-04-05 DIAGNOSIS — E109 Type 1 diabetes mellitus without complications: Secondary | ICD-10-CM

## 2013-04-05 DIAGNOSIS — C799 Secondary malignant neoplasm of unspecified site: Secondary | ICD-10-CM

## 2013-04-05 DIAGNOSIS — C7951 Secondary malignant neoplasm of bone: Secondary | ICD-10-CM | POA: Diagnosis present

## 2013-04-05 DIAGNOSIS — Z923 Personal history of irradiation: Secondary | ICD-10-CM

## 2013-04-05 DIAGNOSIS — Z515 Encounter for palliative care: Secondary | ICD-10-CM

## 2013-04-05 DIAGNOSIS — C7931 Secondary malignant neoplasm of brain: Secondary | ICD-10-CM | POA: Diagnosis present

## 2013-04-05 LAB — COMPREHENSIVE METABOLIC PANEL
ALT: 44 U/L (ref 0–53)
AST: 26 U/L (ref 0–37)
CO2: 23 mEq/L (ref 19–32)
Calcium: 8.7 mg/dL (ref 8.4–10.5)
Chloride: 102 mEq/L (ref 96–112)
Creatinine, Ser: 0.56 mg/dL (ref 0.50–1.35)
GFR calc Af Amer: >90 mL/min (ref >90)
GFR calc non Af Amer: >90 mL/min (ref >90)
Glucose, Bld: 184 mg/dL — ABNORMAL HIGH (ref 70–99)
Total Bilirubin: 0.3 mg/dL (ref 0.3–1.2)

## 2013-04-05 LAB — CBC WITH DIFFERENTIAL/PLATELET
Basophils Absolute: 0 10*3/uL (ref 0.0–0.1)
Eosinophils Relative: 0 % (ref 0–5)
HCT: 27.3 % — ABNORMAL LOW (ref 39.0–52.0)
Hemoglobin: 8.9 g/dL — ABNORMAL LOW (ref 13.0–17.0)
Lymphocytes Relative: 6 % — ABNORMAL LOW (ref 12–46)
Lymphs Abs: 0.4 10*3/uL — ABNORMAL LOW (ref 0.7–4.0)
MCV: 81 fL (ref 78.0–100.0)
Monocytes Absolute: 0.3 10*3/uL (ref 0.1–1.0)
Neutro Abs: 6.6 10*3/uL (ref 1.7–7.7)
RBC: 3.37 MIL/uL — ABNORMAL LOW (ref 4.22–5.81)
WBC: 7.3 10*3/uL (ref 4.0–10.5)

## 2013-04-05 LAB — CBC
HCT: 25 % — ABNORMAL LOW (ref 39.0–52.0)
Hemoglobin: 8.5 g/dL — ABNORMAL LOW (ref 13.0–17.0)
MCHC: 34 g/dL (ref 30.0–36.0)
RBC: 3.14 MIL/uL — ABNORMAL LOW (ref 4.22–5.81)
WBC: 7.1 10*3/uL (ref 4.0–10.5)

## 2013-04-05 LAB — GLUCOSE, CAPILLARY
Glucose-Capillary: 134 mg/dL — ABNORMAL HIGH (ref 70–99)
Glucose-Capillary: 189 mg/dL — ABNORMAL HIGH (ref 70–99)

## 2013-04-05 LAB — CREATININE, SERUM
Creatinine, Ser: 0.5 mg/dL (ref 0.50–1.35)
GFR calc Af Amer: >90 mL/min (ref >90)
GFR calc non Af Amer: >90 mL/min (ref >90)

## 2013-04-05 MED ORDER — ALUM & MAG HYDROXIDE-SIMETH 200-200-20 MG/5ML PO SUSP: 30.0000 mL | Freq: Four times a day (QID) | ORAL | Status: DC | PRN

## 2013-04-05 MED ORDER — PANTOPRAZOLE SODIUM 40 MG PO TBEC: 40.0000 mg | DELAYED_RELEASE_TABLET | Freq: Every day | ORAL | Status: AC

## 2013-04-05 MED ORDER — SORBITOL 70 % SOLN: 30.0000 mL | Freq: Every day | Status: DC | PRN

## 2013-04-05 MED ORDER — BISACODYL 10 MG RE SUPP: 10.0000 mg | Freq: Every day | RECTAL | Status: DC | PRN

## 2013-04-05 MED ORDER — FENTANYL 25 MCG/HR TD PT72
25.0000 ug | MEDICATED_PATCH | TRANSDERMAL | Status: DC
  Administered 2013-04-06 – 2013-04-09 (×2): 25 ug via TRANSDERMAL
  Filled 2013-04-05: qty 1

## 2013-04-05 MED ORDER — INSULIN ASPART PROT & ASPART (70-30 MIX) 100 UNIT/ML ~~LOC~~ SUSP
20.0000 [IU] | Freq: Every day | SUBCUTANEOUS | Status: DC
  Administered 2013-04-05 – 2013-04-14 (×10): 20 [IU] via SUBCUTANEOUS
  Filled 2013-04-05: qty 10

## 2013-04-05 MED ORDER — DEXAMETHASONE 4 MG PO TABS: 4.0000 mg | ORAL_TABLET | Freq: Four times a day (QID) | ORAL | Status: AC

## 2013-04-05 MED ORDER — INSULIN ASPART 100 UNIT/ML ~~LOC~~ SOLN
0.0000 [IU] | Freq: Three times a day (TID) | SUBCUTANEOUS | Status: DC
  Administered 2013-04-05 – 2013-04-06 (×2): 3 [IU] via SUBCUTANEOUS
  Administered 2013-04-06 – 2013-04-07 (×3): 4 [IU] via SUBCUTANEOUS
  Administered 2013-04-07: 3 [IU] via SUBCUTANEOUS
  Administered 2013-04-07: 7 [IU] via SUBCUTANEOUS
  Administered 2013-04-08 – 2013-04-09 (×5): 4 [IU] via SUBCUTANEOUS
  Administered 2013-04-09: 3 [IU] via SUBCUTANEOUS
  Administered 2013-04-10: 4 [IU] via SUBCUTANEOUS
  Administered 2013-04-10: 3 [IU] via SUBCUTANEOUS
  Administered 2013-04-10 – 2013-04-11 (×2): 4 [IU] via SUBCUTANEOUS
  Administered 2013-04-11: 3 [IU] via SUBCUTANEOUS
  Administered 2013-04-11: 7 [IU] via SUBCUTANEOUS
  Administered 2013-04-12 (×2): 4 [IU] via SUBCUTANEOUS
  Administered 2013-04-12 – 2013-04-13 (×2): 7 [IU] via SUBCUTANEOUS
  Administered 2013-04-13 (×2): 4 [IU] via SUBCUTANEOUS
  Administered 2013-04-14: 7 [IU] via SUBCUTANEOUS
  Administered 2013-04-14: 4 [IU] via SUBCUTANEOUS
  Administered 2013-04-14 – 2013-04-15 (×2): 7 [IU] via SUBCUTANEOUS
  Administered 2013-04-15: 3 [IU] via SUBCUTANEOUS
  Administered 2013-04-15: 4 [IU] via SUBCUTANEOUS
  Administered 2013-04-16: 7 [IU] via SUBCUTANEOUS
  Administered 2013-04-16 (×2): 4 [IU] via SUBCUTANEOUS
  Administered 2013-04-17: 2 [IU] via SUBCUTANEOUS
  Administered 2013-04-17: 4 [IU] via SUBCUTANEOUS
  Administered 2013-04-17: 7 [IU] via SUBCUTANEOUS
  Administered 2013-04-18: 3 [IU] via SUBCUTANEOUS
  Administered 2013-04-18: 4 [IU] via SUBCUTANEOUS

## 2013-04-05 MED ORDER — DEXAMETHASONE SODIUM PHOSPHATE 4 MG/ML IJ SOLN
4.0000 mg | Freq: Four times a day (QID) | INTRAMUSCULAR | Status: DC
  Filled 2013-04-05 (×23): qty 1

## 2013-04-05 MED ORDER — HYDROMORPHONE HCL PF 1 MG/ML IJ SOLN: 0.5000 mg | INTRAMUSCULAR | Status: DC | PRN

## 2013-04-05 MED ORDER — HEPARIN SODIUM (PORCINE) 5000 UNIT/ML IJ SOLN: 5000.0000 [IU] | Freq: Three times a day (TID) | INTRAMUSCULAR | Status: DC

## 2013-04-05 MED ORDER — DIAZEPAM 5 MG PO TABS: 5.0000 mg | ORAL_TABLET | Freq: Four times a day (QID) | ORAL | Status: DC | PRN

## 2013-04-05 MED ORDER — PANTOPRAZOLE SODIUM 40 MG PO TBEC
40.0000 mg | DELAYED_RELEASE_TABLET | Freq: Every day | ORAL | Status: DC
  Administered 2013-04-05 – 2013-04-17 (×13): 40 mg via ORAL
  Filled 2013-04-05 (×13): qty 1

## 2013-04-05 MED ORDER — ACETAMINOPHEN 325 MG PO TABS: 325.0000 mg | ORAL_TABLET | ORAL | Status: DC | PRN

## 2013-04-05 MED ORDER — ONDANSETRON HCL 4 MG/2ML IJ SOLN: 4.0000 mg | Freq: Four times a day (QID) | INTRAMUSCULAR | Status: DC | PRN

## 2013-04-05 MED ORDER — OXYCODONE-ACETAMINOPHEN 5-325 MG PO TABS
1.0000 | ORAL_TABLET | ORAL | Status: DC | PRN
  Administered 2013-04-05 – 2013-04-15 (×34): 2 via ORAL
  Filled 2013-04-05 (×37): qty 2

## 2013-04-05 MED ORDER — DEXAMETHASONE 4 MG PO TABS
4.0000 mg | ORAL_TABLET | Freq: Four times a day (QID) | ORAL | Status: DC
  Administered 2013-04-05 – 2013-04-10 (×19): 4 mg via ORAL
  Filled 2013-04-05 (×23): qty 1

## 2013-04-05 MED ORDER — HEPARIN SOD (PORK) LOCK FLUSH 100 UNIT/ML IV SOLN
500.0000 [IU] | INTRAVENOUS | Status: DC | PRN
  Filled 2013-04-05: qty 5

## 2013-04-05 MED ORDER — LISINOPRIL 10 MG PO TABS
10.0000 mg | ORAL_TABLET | Freq: Every day | ORAL | Status: DC
  Administered 2013-04-06 – 2013-04-18 (×13): 10 mg via ORAL
  Filled 2013-04-05 (×14): qty 1

## 2013-04-05 MED ORDER — POLYETHYLENE GLYCOL 3350 17 G PO PACK: 17.0000 g | PACK | Freq: Every day | ORAL | Status: DC | PRN

## 2013-04-05 MED ORDER — SENNA 8.6 MG PO TABS: 1.0000 | ORAL_TABLET | Freq: Two times a day (BID) | ORAL | Status: AC

## 2013-04-05 MED ORDER — HYDROCHLOROTHIAZIDE 25 MG PO TABS
25.0000 mg | ORAL_TABLET | Freq: Every day | ORAL | Status: DC
  Administered 2013-04-06 – 2013-04-18 (×13): 25 mg via ORAL
  Filled 2013-04-05 (×14): qty 1

## 2013-04-05 MED ORDER — INSULIN ASPART 100 UNIT/ML ~~LOC~~ SOLN: 0.0000 [IU] | Freq: Three times a day (TID) | SUBCUTANEOUS | Status: AC

## 2013-04-05 MED ORDER — HYDROCODONE-ACETAMINOPHEN 5-325 MG PO TABS
1.0000 | ORAL_TABLET | ORAL | Status: DC | PRN
  Administered 2013-04-06 – 2013-04-07 (×5): 2 via ORAL
  Administered 2013-04-08 – 2013-04-10 (×3): 1 via ORAL
  Administered 2013-04-11 – 2013-04-13 (×6): 2 via ORAL
  Filled 2013-04-05 (×9): qty 2
  Filled 2013-04-05: qty 1
  Filled 2013-04-05 (×3): qty 2
  Filled 2013-04-05: qty 1
  Filled 2013-04-05: qty 2

## 2013-04-05 MED ORDER — POLYETHYLENE GLYCOL 3350 17 G PO PACK
17.0000 g | PACK | Freq: Every day | ORAL | Status: DC | PRN
  Filled 2013-04-05: qty 1

## 2013-04-05 MED ORDER — DOXAZOSIN MESYLATE 8 MG PO TABS
8.0000 mg | ORAL_TABLET | Freq: Every day | ORAL | Status: DC
  Administered 2013-04-05 – 2013-04-17 (×13): 8 mg via ORAL
  Filled 2013-04-05 (×15): qty 1

## 2013-04-05 MED ORDER — HEPARIN SODIUM (PORCINE) 5000 UNIT/ML IJ SOLN
5000.0000 [IU] | Freq: Three times a day (TID) | INTRAMUSCULAR | Status: DC
  Administered 2013-04-05 – 2013-04-18 (×39): 5000 [IU] via SUBCUTANEOUS
  Filled 2013-04-05 (×41): qty 1

## 2013-04-05 MED ORDER — ONDANSETRON HCL 4 MG PO TABS: 4.0000 mg | ORAL_TABLET | Freq: Four times a day (QID) | ORAL | Status: DC | PRN

## 2013-04-05 MED ORDER — FENTANYL 25 MCG/HR TD PT72
50.0000 ug | MEDICATED_PATCH | TRANSDERMAL | Status: DC
  Administered 2013-04-06 – 2013-04-09 (×2): 50 ug via TRANSDERMAL
  Filled 2013-04-05 (×3): qty 2

## 2013-04-05 MED ORDER — SENNA 8.6 MG PO TABS
1.0000 | ORAL_TABLET | Freq: Two times a day (BID) | ORAL | Status: DC
  Administered 2013-04-05 – 2013-04-18 (×26): 8.6 mg via ORAL
  Filled 2013-04-05 (×31): qty 1

## 2013-04-05 NOTE — Progress Notes (Signed)
Physical Therapy Treatment Patient Details Name: Andrew Nelson MRN: [id number] DOB: 09/14/1950 Today's Date: 04/05/2013 Time: 5784-6962 PT Time Calculation (min): 24 min  PT Assessment / Plan / Recommendation  History of Present Illness pt began radiation therapy 04/03/13   PT Comments   Pt making steady progress toward goals.  Follow Up Recommendations  CIR;Supervision/Assistance - 24 hour     Equipment Recommendations  None recommended by PT    Recommendations for Other Services Rehab consult  Frequency Min 5X/week   Progress towards PT Goals Progress towards PT goals: Progressing toward goals  Plan Current plan remains appropriate    Precautions / Restrictions Precautions Precautions: Fall;Back Precaution Comments: able to recall 3/3 precautions Restrictions Weight Bearing Restrictions: No       Mobility  Bed Mobility Rolling Left: 4: Min assist;With rail Left Sidelying to Sit: HOB flat;With rails;3: Mod assist Sitting - Scoot to Edge of Bed: 4: Min assist Details for Bed Mobility Assistance: cues for technique and assist to complete transitional movements Transfers Transfers: Stand Pivot Transfers Sit to Stand: From bed;With upper extremity assist;Other (comment) Stand to Sit: To chair/3-in-1;With upper extremity assist Stand Pivot Transfers: Other (comment) (sara plus used) Transfer via Lift Equipment: Hydrographic surveyor Details for Transfer Assistance: used sara plus lift today with foot plate removed for all transfers. +2 for safety with stand<>pivot from bed to chair with pt taking 4-5 side steps. knees buckling x1 with sara blocking and pt able to recover knee extension bilaterally.                 Ambulation/Gait Ambulation/Gait Assistance: Not tested (comment)    Exercises Other Exercises Other Exercises: standing with sara plus: heel raises x5 bil legs, ant/post weight shifting with emphasis on finding and maintaining midline/upright posture.      PT Goals  (current goals can now be found in the care plan section) Acute Rehab PT Goals Patient Stated Goal: to be independent at home  PT Goal Formulation: With patient Time For Goal Achievement: 04/14/13 Potential to Achieve Goals: Good  Visit Information  Last PT Received On: 04/05/13 Assistance Needed: +2 History of Present Illness: pt began radiation therapy 04/03/13    Subjective Data  Patient Stated Goal: to be independent at home    Cognition  Cognition Arousal/Alertness: Awake/alert Behavior During Therapy: WFL for tasks assessed/performed Overall Cognitive Status: Within Functional Limits for tasks assessed       End of Session PT - End of Session Equipment Utilized During Treatment: Gait belt;Other (comment) (sara plus) Activity Tolerance: Patient tolerated treatment well Patient left: in chair;with call bell/phone within reach;with family/visitor present Nurse Communication: Mobility status;Need for lift equipment   GP     Sallyanne Kuster 04/05/2013, 1:20 PM  Sallyanne Kuster, PTA Office- [phone]

## 2013-04-05 NOTE — Progress Notes (Signed)
Occupational Therapy Treatment Patient Details Name: Andrew Nelson MRN: [id number] DOB: 1950-12-03 Today's Date: 04/05/2013 Time: 1478-2956 OT Time Calculation (min): 23 min  OT Assessment / Plan / Recommendation  History of present illness pt began radiation therapy 04/03/13   OT comments  Pt didi well today!  Follow Up Recommendations  CIR             Frequency Min 2X/week   Progress towards OT Goals Progress towards OT goals: Progressing toward goals     Precautions / Restrictions Precautions Precautions: Fall;Back Precaution Comments: able to recall 3/3 precautions Restrictions Weight Bearing Restrictions: No       ADL  Toilet Transfer: Performed;+2 Total assistance;Other (comment) (used steady) Statistician Method: Sit to Barista: Materials engineer and Hygiene: Performed;+2 Total assistance Transfers/Ambulation Related to ADLs: Used steady to get to bathroom.  Pt did well pulling up in to standing,. After sitting in chair and toilet pt needed to get back in bed due to pain      OT Goals(current goals can now be found in the care plan section) Acute Rehab OT Goals Patient Stated Goal: to be independent at home   Visit Information  Last OT Received On: 04/05/13 Assistance Needed: +2 History of Present Illness: pt began radiation therapy 04/03/13    Subjective Data      Prior Functioning       Cognition  Cognition Arousal/Alertness: Awake/alert Behavior During Therapy: WFL for tasks assessed/performed Overall Cognitive Status: Within Functional Limits for tasks assessed    Mobility  Bed Mobility Rolling Left: 4: Min assist;With rail Left Sidelying to Sit: HOB flat;With rails;3: Mod assist Sitting - Scoot to Edge of Bed: 4: Min assist Details for Bed Mobility Assistance: cues for technique and assist to complete transitional movements Transfers Sit to Stand: From bed;With upper extremity  assist;Other (comment) Stand to Sit: To chair/3-in-1;With upper extremity assist Transfer via Lift Equipment: Hydrographic surveyor Details for Transfer Assistance: used sara plus lift today with foot plate removed for all transfers. +2 for safety with stand<>pivot from bed to chair with pt taking 4-5 side steps. knees buckling x1 with sara blocking and pt able to recover knee extension bilaterally.                    Exercises  Other Exercises Other Exercises: standing with sara plus: heel raises x5 bil legs, ant/post weight shifting with emphasis on finding and maintaining midline/upright posture.   Balance     End of Session OT - End of Session Equipment Utilized During Treatment: Other (comment) (steady) Activity Tolerance: Patient tolerated treatment well       Fabiola Mudgett, [place] 04/05/2013, 1:34 PM

## 2013-04-05 NOTE — Progress Notes (Signed)
[facility] PROGRESS NOTE  [name] ZOX:[id number] DOB: 14-Aug-1950 DOA: 03/29/2013 PCP: [name], MD  Brief narrative: Andrew Nelson is an 62 y.o. male with a PMH of metastatic castration resistant prostate cancer, status post radiation therapy and chemotherapy with Ellie Lunch, which is now on hold, status post T6-T7 laminectomy and resection of epidural tumor, left L5 decompressive hemilaminectomy with resection of epidural tumor on 03/13/2013 who was admitted on 03/29/2013 with progressively worsening lower extremity weakness.   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 He has now been initiated on radiation therapy.  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 -Radiation therapy started 04/03/2013; tolerating well.  -Continue decadron 4 mg IV every 6 hours, Toradol 30 mg every 6 hours IV.  -Continue norco PO PRN moderate pain and dilaudid 1 mg every 2 hours IV PRN severe pain.  -CIR consulted for possible inpatient rehabilitation. Active Problems:  DM w/o Complication Type I  -CBGs 124-202.  -Continue insulin 70/30 mixed 20 units at bedtime.  -Continue NovoLog sliding scale.  Prostate cancer with spinal metastasis  -Management per oncology; now receiving RT to lumbar area, started 04/03/2013.  -Continue Decadron 4 mg every 6 hours IV.  -Continue doxazosin 8 mg daily. Hypertension  -Continue HCTZ 25 mg daily and lisinopril 10 mg daily.  -Blood pressure controlled.  Anemia of chronic disease  -Secondary to history of prostate cancer.  -Hemoglobin stable. No indications for transfusion.  Hyponatremia  -Likely secondary to dehydration, corrected  with hydration.  Moderate protein calorie malnutrition  -Secondary to cancer related cachexia.  Code Status: Full. Family Communication: Wife updated at bedside. Disposition Plan: SNF versus inpatient rehabilitation.   Medical Consultants:  [name], Radiation oncology  Dr. Harold Hedge, Physical medicine and rehabilitation  Dr. Coletta Memos, Neurosurgery  [name], Oncology  Other Consultants:  Physical therapy  Anti-infectives:  None.  HPI/Subjective: Andrew Nelson had an episode of right sided chest pain / heaviness earlier today.  It has eased off now.  No associated nausea or diaphoresis, non-exertional.  No problems with appetite.  Bowels moving.  Objective: Filed Vitals:   04/04/13 1801 04/04/13 2057 04/05/13 0146 04/05/13 0510  BP: 123/63 121/56 106/54 123/55  Pulse: 76 91 61 72  Temp: 98.3 F (36.8 C) 98.6 F (37 C) 98.6 F (37 C) 98.6 F (37 C)  TempSrc: Oral Oral Oral Oral  Resp: 18 18 16 16   Height:      Weight:    74.481 kg (164 lb 3.2 oz)  SpO2: 100% 99% 100% 100%    Intake/Output Summary (Last 24 hours) at 04/05/13 0754 Last data filed at 04/04/13 2059  Gross per 24 hour  Intake    640 ml  Output    600 ml  Net     40 ml    Exam: Gen:  NAD Cardiovascular:  RRR, No M/R/G Respiratory:  Lungs CTAB Gastrointestinal:  Abdomen soft, NT/ND, + BS Extremities:  No C/E/C  Data Reviewed: Basic Metabolic Panel:  Recent Labs Lab 04/01/13 0555 04/02/13 0900 04/03/13 0430 04/04/13 0545 04/05/13 0450  NA 135 133* 132* 133* 135  K 3.8 3.7 4.0 4.2 3.9  CL 102 100 100 101 102  CO2 24 24 27 26  23  GLUCOSE 186* 203* 150* 139* 184*  BUN 26* 26* 27* 33* 30*  CREATININE 0.59 0.50 0.50 0.57 0.56  CALCIUM 9.3 9.2 8.8 8.7 8.7   GFR Estimated Creatinine Clearance: 90 ml/min (by C-G formula based on Cr of 0.56). Liver Function Tests:  Recent Labs Lab 04/01/13 0555 04/02/13 0900 04/03/13 0430 04/04/13 0545  04/05/13 0450  AST 663* 60* 34 28 26  ALT 70* 31 40 43 44  ALKPHOS 332* 277* 234* 197* 186*  BILITOT 0.5 0.5 0.3 0.3 0.3  PROT 5.6* 5.8* 5.3* 5.1* 5.2*  ALBUMIN 2.5* 2.5* 2.2* 2.2* 2.3*   CBC:  Recent Labs Lab 04/01/13 0555 04/02/13 0900 04/03/13 0430 04/04/13 0545 04/05/13 0450  WBC 9.7 8.1 7.6 7.6 7.3  NEUTROABS 8.6* 7.5 6.7 6.6 6.6  HGB 9.3* 9.4* 8.6* 8.3* 8.9*  HCT 27.9* 28.5* 26.3* 25.6* 27.3*  MCV 80.2 80.3 80.7 80.8 81.0  PLT 128* 154 161 196 216   CBG:  Recent Labs Lab 04/03/13 2149 04/04/13 0735 04/04/13 1146 04/04/13 1524 04/05/13 0034  GLUCAP 178* 124* 202* 129* 137*   Microbiology Recent Results (from the past 240 hour(s))  TECHNOLOGIST REVIEW     Status: None   Collection Time    03/29/13  3:57 PM      Result Value Range Status   Technologist Review few shistocytes, few helmets, few teardrops   Final  SURGICAL PCR SCREEN     Status: None   Collection Time    03/30/13  4:39 PM      Result Value Range Status   MRSA, PCR NEGATIVE  NEGATIVE Final   Staphylococcus aureus NEGATIVE  NEGATIVE Final   Comment:            The Xpert SA Assay (FDA     approved for NASAL specimens     in patients over 46 years of age),     is one component of     a comprehensive surveillance     program.  Test performance has     been validated by [employer] for patients greater     than or equal to 12 year old.     It is not intended     to diagnose infection nor to     guide or monitor treatment.     Procedures and Diagnostic Studies: Mr Thoracic and lumbar Spine Wo Contrast  03/30/2013   CLINICAL DATA:  62 year old male with prostate cancer known to be widely metastatic to bone. History of thoracic cord compression status post decompression earlier this month. Acute paraplegia x1 day. Sudden onset severe lower extremity weakness.  EXAM: MRI THORACIC AND LUMBAR SPINE WITHOUT CONTRAST  TECHNIQUE: Multiplanar and multiecho pulse sequences of the thoracic and  lumbar spine were obtained without intravenous contrast.  COMPARISON:  Preoperative thoracic and lumbar MRI 03/13/2013.  FINDINGS: MR THORACIC SPINE FINDINGS  Limited sagittal imaging of the cervical spine again suggest diffuse cervical vertebral tumor involvement.  Interval diffuse thoracic spine bony metastatic disease, less pronounced at the T7, T8, and T9 vertebral bodies as before. Interval thoracic spine decompression centered at the T6-T7 level. Small volume of posterior postoperative paraspinal fluid. No mass effect on the thecal sac at this level.  Confluent right greater than left epidural tumor with cord compression at the T5 level, appears progressed since 03/13/2013. Epidural tumor continues to the upper aspect of the decompression level. Moderate to severe foraminal involvement by tumor at the bilateral T5 and right  T4 neural foramen.  Otherwise epidural/ extraosseous tumor in the thoracic spine is most pronounced at the right T1 neural foramen/costovertebral junction, the posterior T1 epidural space (series 8, image 3), right T2 neural foramen, and left T11 pedicle/neural foramen. No additional cord compression.  No definite abnormal spinal cord signal/edema identified. Conus medullaris is described in the lumbar section below.  Additional rib associated and paraspinal tumor in the thorax (on the left at T11-T12, on the right at T8, see image 8).  Lumbar findings are described below. 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 There is a 2 cm area of increased signal in the right hepatic lobe which might represent liver metastatic disease (series 8, image 44).  MR LUMBAR SPINE FINDINGS  Diffuse lumbar spine metastatic disease.  Progression of epidural tumor arising from the right L1 pedicle and posterior elements, with mild spinal stenosis now at the level of the conus (series 15, image 7). Mild involvement of the right T1 neural foramen not significantly changed. No  signal abnormality in the conus.  Dorsal epidural tumor at the L3 level along with increased epidural lipomatosis here now results in mild L3 spinal stenosis.  Bulky ventral epidural tumor re- identified at the L5 level with increased spinal stenosis at this level, now severe. Interval left hemilaminectomy with a small volume of postoperative fluid in the laminectomy space. Severe epidural tumor involvement of the left L4 neural foramen again noted, in conjunction with ventral L4 epidural tumor which is not significantly changed. Severe involvement of the left L5 neural foramen is stable. Severe involvement of the right L5 neural foramen appears progressed.  Sacral spinal canal remains patent. Stable visualized abdominal viscera. Postoperative changes in the left lower paraspinal soft tissues.  IMPRESSION: MR THORACIC SPINE IMPRESSION  1. Progression of thoracic epidural tumor centered at the T5 vertebral level with cord compression. No definite cord signal abnormality.  2. Interval thoracic spine decompression centered at T6-T7. No adverse features.  3. Thoracic paraspinal and foraminal tumor elsewhere but no additional thoracic cord compression.  MR LUMBAR SPINE IMPRESSION  1. Progressed epidural tumor at the L1 level emanating from the right pedicle/ posterior elements with new spinal stenosis corresponding to the level of the conus medullaris. No conus signal abnormality.  2. Increased spinal stenosis at L3, mild and related to both dorsal epidural tumor and epidural lipomatosis. 3. Mild progression of epidural and foraminal tumor centered at the L5 level. Spinal stenosis at L5 appears increased and severe despite interval left laminectomy changes at this level.  I spoke to Dr. Elisabeth Pigeon on the [facility] hospitalist team at the time of this report. She advised that she had already discussed this exam with Dr. Jordan Likes of neurosurgery who is advising transfer of the patient to [facility] for an emergent  surgical spine decompression.   Electronically Signed   By: Augusto Gamble   On: 03/30/2013 15:19    Scheduled Meds: . dexamethasone  4 mg Intravenous Q6H   Or  . dexamethasone  4 mg Oral Q6H  . doxazosin  8 mg Oral QHS  . fentaNYL  50 mcg Transdermal Q72H  . fentaNYL  25 mcg Transdermal Q72H  . heparin  5,000 Units Subcutaneous Q8H  . hydrochlorothiazide  25 mg Oral Daily  . insulin aspart  0-20 Units Subcutaneous TID WC  . insulin aspart protamine- aspart  20 Units Subcutaneous Q supper  . lisinopril  10 mg Oral Daily  . pantoprazole  40 mg Oral QHS  . senna  1 tablet Oral BID   Continuous Infusions: . sodium chloride 250 mL (03/31/13 0800)    Time spent: 25 minutes.   LOS: 7 days   Milly Goggins  [facility] Pager [phone].   *Please note that the hospitalists switch teams on Wednesdays. Please call the flow manager at [phone] if you are having difficulty reaching the hospitalist taking care of this patient as she can update you and provide the most up-to-date pager number of provider caring for the patient. If 8PM-8AM, please contact night-coverage at [email or web address], password [facility]  04/05/2013, 7:54 AM

## 2013-04-05 NOTE — H&P (Signed)
Physical Medicine and Rehabilitation Admission H&P  Back pain and lower extremity weakness :  HPI: Andrew Nelson is a 62 y.o. right-handed male with history of known metastatic prostate carcinoma status post previous T6-7 and L5 laminectomies for resection of epidural tumor 03/14/2013 and was discharged to home 03/16/2013. Patient was seen by his oncologist [name] 03/29/2013 with patient reporting acute onset of lower extremity weakness to the point he could not get out of bed. Patient's prior functional status was able to ambulate with minimal effort using a walker while at home. He was admitted 03/29/2013 for further evaluation. Emergent workup demonstrated evidence of marked progression of his epidural tumor at T4-5 level causing severe spinal cord cauda equina compression. He had progression of his dorsal L3 epidural tumor as well and the epidural tumor behind the body of L5 also appear to be progressing.. patient underwent T4-T5 laminectomy resection of epidural tumor 03/30/2013 per Dr. Jordan Likes. Postoperative pain management. Subcutaneous heparin for DVT prophylaxis. Radiation therapy initiated 04/03/2013 per [name] x30 sessions. Physical and occupational therapy evaluations completed. M.D.has requested physical medicine rehabilitation consult to consider inpatient rehabilitation services. After consultation, the patient was ultimately admitted to today for inpatient rehab.     Review of Systems  Gastrointestinal: Positive for constipation.  Genitourinary: Positive for urgency.  Musculoskeletal: Positive for back pain.  Neurological: Positive for weakness.  All other systems reviewed and are negative  Past Medical History   Diagnosis  Date   .  Diabetes mellitus    .  Hypertension    .  Prostate ca  6/07     prostate ca dx   .  Allergy    .  Anemia    .  CAD (coronary artery disease)    .  Blood transfusion without reported diagnosis      May 2013   .  History of  radiation therapy  09/19/12-10/10/2012     T-7-T9,35Gy/20fx,Prescaral mass 35Gy/83fx   .  History of radiation therapy  05/2006-06/2006     salvage prostate rad tx 68.4Gy   .  Bone cancer      prostate ca with mets to spine    Past Surgical History   Procedure  Laterality  Date   .  Prostatectomy   03/03/06     radical,gleason 4+5=9,Adenocarcinoma   .  Colonoscopy w/ polypectomy   03/01/01     hyperplastic,no adenomatous change or malignancy   .  Portacath placement       right   .  Laminectomy  N/A  03/14/2013     Procedure: Thoracic six-seven laminectomy, resection of tumor. Lumbar five laminectomy and resection of tumor; Surgeon: Temple Pacini, MD; Location: [facility]; Service: Neurosurgery; Laterality: N/A;   .  Laminectomy  Bilateral  03/30/2013     Procedure: THORACIC LAMINECTOMY FOR TUMOR; Surgeon: Temple Pacini, MD; Location: [facility]; Service: Neurosurgery; Laterality: Bilateral; Thoracic Four-Six Laminectomy for Tumor    Family History   Problem  Relation  Age of Onset   .  Cancer  Other      prostate   .  Stroke  Other    .  Prostate cancer  Father    .  Colon cancer  Paternal Grandfather     Social History: reports that he has quit smoking. He has never used smokeless tobacco. He reports that he does not drink alcohol or use illicit drugs.  Allergies:  Allergies   Allergen  Reactions   .  Aprindine  Other (See Comments)     unknown   .  Bc Powder [Aspirin-Salicylamide-Caffeine]  Hives   .  Penicillins  Hives    Medications Prior to Admission   Medication  Sig  Dispense  Refill   .  ACCU-CHEK AVIVA PLUS test strip  USE AS DIRECTED  100 strip  3   .  dexamethasone (DECADRON) 4 MG tablet  Take 0.5 tablets (2 mg total) by mouth 2 (two) times daily with a meal.  60 tablet  0   .  doxazosin (CARDURA) 8 MG tablet  Take 8 mg by mouth at bedtime.     .  fentaNYL (DURAGESIC - DOSED MCG/HR) 25 MCG/HR patch  Place 1 patch (25 mcg total) onto the skin every 3 (three) days. Pt  put patch on Sunday. Pt takes a total of 75mg  dose  10 patch  0   .  fentaNYL (DURAGESIC - DOSED MCG/HR) 50 MCG/HR  Place 1 patch onto the skin every 3 (three) days. Pt put patch on Sunday. Pt takes a total of 75 mg dose     .  hydrochlorothiazide (HYDRODIURIL) 25 MG tablet  Take 25 mg by mouth daily.     .  insulin aspart protamine-insulin aspart (NOVOLOG 70/30) (70-30) 100 UNIT/ML injection  Inject 20 Units into the skin daily with supper.     Marland Kitchen  leuprolide (LUPRON) 30 MG injection  Inject 30 mg into the muscle every 4 (four) months. Pt had 2 weeks ago     .  lidocaine-prilocaine (EMLA) cream  Apply 1 application topically daily as needed (APPLY TO PORTA-CATH SITE 30 MINS TO 2 HOURS PRIOR TO TREATMENT & COVER WITH PLASTIC WRAP).     Marland Kitchen  lisinopril (PRINIVIL,ZESTRIL) 10 MG tablet  Take 10 mg by mouth daily.     .  metFORMIN (GLUCOPHAGE) 850 MG tablet  Take 850 mg by mouth 2 (two) times daily with a meal.     .  oxyCODONE-acetaminophen (PERCOCET) 10-325 MG per tablet  Take 1-2 tablets by mouth every 4 (four) hours as needed for pain.  80 tablet  0   .  senna (SENOKOT) 8.6 MG TABS tablet  Take 1 tablet by mouth.      Home:  Home Living  Family/patient expects to be discharged to:: Private residence  Living Arrangements: Spouse/significant other  Available Help at Discharge: Available 24 hours/day;Family  Type of Home: House  Home Access: Stairs to enter  [employer] of Steps: 3  Entrance Stairs-Rails: Right  Home Layout: Two level;Bed/bath upstairs  Alternate Level Stairs-Number of Steps: 12  Alternate Level Stairs-Rails: Can reach both  Home Equipment: Shower seat;Bedside commode;Other (comment);Walker - 2 wheels;Adaptive equipment  Adaptive Equipment: Reacher;Sock aid;Long-handled shoe horn  Functional History:   Functional Status:  Mobility:  Bed Mobility  Bed Mobility: Rolling Right;Supine to Sit;Right Sidelying to Sit  Rolling Right: 4: Min assist  Rolling Left: 4: Min  assist  Right Sidelying to Sit: 3: Mod assist  Left Sidelying to Sit: 2: Max assist;HOB flat  Sitting - Scoot to [place] of Bed: 4: Min assist  Transfers  Transfers: Sit to Stand;Stand to Sit  Sit to Stand: 1: +2 Total assist;From bed;Without upper extremity assist  Sit to Stand: Patient Percentage: 40%  Stand to Sit: 1: +2 Total assist  Stand to Sit: Patient Percentage: 40%  Ambulation/Gait  Ambulation/Gait Assistance: Not tested (comment) (Pt using stedy for transfer assist)  Stairs: No  Engineer, drilling  Mobility: No  ADL:  ADL  Eating/Feeding: Performed;Independent  Where Assessed - Eating/Feeding: Bed level  Upper Body Dressing: Performed;Minimal assistance  Where Assessed - Upper Body Dressing: Supported sitting  Lower Body Dressing: Performed;+1 Total assistance  Where Assessed - Lower Body Dressing: Supported sitting  Toilet Transfer: Performed;+2 Total assistance  Toilet Transfer Method: Archivist: Bedside commode  Equipment Used: Gait belt;Rolling walker  Transfers/Ambulation Related to ADLs: +2 total assist for sit<>stand and for SPT from bed<>3n1. Pt requriring assist due to bil LE weakness and decreased proprioception and sensation in LEs. When attempting to transfer from 3n1<>chair, pt's RLE extending back under 3n1 (unsure if this is tone vs poor proprioception), RN then arrived to assist. Pt stood with +2 total assist from 3n1 with PT/OT while RN removed 3n1 and placed chair behind pt.  ADL Comments: Pt in bed complaining of pain. OT provided education on repositioning in bed to decrease pain. Pt educated on supportive position in side lying, as well as supine. Pt declined further mobility at this time due to pain.  Cognition:  Cognition  Overall Cognitive Status: Within Functional Limits for tasks assessed  Orientation Level: Oriented X4  Cognition  Arousal/Alertness: Awake/alert  Behavior During Therapy: WFL for tasks  assessed/performed  Overall Cognitive Status: Within Functional Limits for tasks assessed    Physical Exam:  Blood pressure 123/55, pulse 72, temperature 98.6 F (37 C), temperature source Oral, resp. rate 16, height 5' 7.5" (1.715 m), weight 74.481 kg (164 lb 3.2 oz), SpO2 100.00%.  Constitutional: He is oriented to person, place, and time.  HENT: oral mucosa pink and moist Head: Normocephalic.  Eyes: EOM are normal.  Neck: Normal range of motion. Neck supple. No thyromegaly present.  Cardiovascular: Normal rate and regular rhythm.  Pulmonary/Chest: Effort normal and breath sounds normal. No respiratory distress.  Abdominal: Soft. Bowel sounds are normal. He exhibits no distension.  Neurological: He is alert and oriented to person, place, and time.  Mid  thoracic sensory level, below which he has   1+ sensation (senses pain stim but has difficulty discerning light touch and proprioception). UE strength 5/5. RLE: HF2-, KE 2 to 2+, ADF and APF 3 to 3+. dtr's tr to 1+ . LLE is gross is grossly 3 to 3+/5 proximal to distal Skin:  Back incision is clean and dry  Psychiatric: He has a normal mood and affect. His behavior is normal. Judgment and thought content normal   Results for orders placed during the hospital encounter of 03/29/13 (from the past 48 hour(s))   GLUCOSE, CAPILLARY Status: Abnormal    Collection Time    04/03/13 11:58 AM   Result  Value  Range    Glucose-Capillary  174 (*)  70 - 99 mg/dL    Comment 1  Notify RN    GLUCOSE, CAPILLARY Status: Abnormal    Collection Time    04/03/13 5:57 PM   Result  Value  Range    Glucose-Capillary  154 (*)  70 - 99 mg/dL    Comment 1  Notify RN    GLUCOSE, CAPILLARY Status: Abnormal    Collection Time    04/03/13 9:49 PM   Result  Value  Range    Glucose-Capillary  178 (*)  70 - 99 mg/dL    Comment 1  Notify RN    CBC WITH DIFFERENTIAL Status: Abnormal    Collection Time    04/04/13 5:45 AM   Result  Value  Range  WBC  7.6   4.0 - 10.5 K/uL    RBC  3.17 (*)  4.22 - 5.81 MIL/uL    Hemoglobin  8.3 (*)  13.0 - 17.0 g/dL    HCT  16.1 (*)  09.6 - 52.0 %    MCV  80.8  78.0 - 100.0 fL    MCH  26.2  26.0 - 34.0 pg    MCHC  32.4  30.0 - 36.0 g/dL    RDW  04.5 (*)  40.9 - 15.5 %    Platelets  196  150 - 400 K/uL    Neutrophils Relative %  88 (*)  43 - 77 %    Neutro Abs  6.6  1.7 - 7.7 K/uL    Lymphocytes Relative  6 (*)  12 - 46 %    Lymphs Abs  0.5 (*)  0.7 - 4.0 K/uL    Monocytes Relative  6  3 - 12 %    Monocytes Absolute  0.4  0.1 - 1.0 K/uL    Eosinophils Relative  0  0 - 5 %    Eosinophils Absolute  0.0  0.0 - 0.7 K/uL    Basophils Relative  0  0 - 1 %    Basophils Absolute  0.0  0.0 - 0.1 K/uL   COMPREHENSIVE METABOLIC PANEL Status: Abnormal    Collection Time    04/04/13 5:45 AM   Result  Value  Range    Sodium  133 (*)  135 - 145 mEq/L    Potassium  4.2  3.5 - 5.1 mEq/L    Chloride  101  96 - 112 mEq/L    CO2  26  19 - 32 mEq/L    Glucose, Bld  139 (*)  70 - 99 mg/dL    BUN  33 (*)  6 - 23 mg/dL    Creatinine, Ser  8.11  0.50 - 1.35 mg/dL    Calcium  8.7  8.4 - 10.5 mg/dL    Total Protein  5.1 (*)  6.0 - 8.3 g/dL    Albumin  2.2 (*)  3.5 - 5.2 g/dL    AST  28  0 - 37 U/L    ALT  43  0 - 53 U/L    Alkaline Phosphatase  197 (*)  39 - 117 U/L    Total Bilirubin  0.3  0.3 - 1.2 mg/dL    GFR calc non Af Amer  >90  >90 mL/min    GFR calc Af Amer  >90  >90 mL/min    Comment:  (NOTE)     The eGFR has been calculated using the CKD EPI equation.     This calculation has not been validated in all clinical situations.     eGFR's persistently <90 mL/min signify possible Chronic Kidney     Disease.   GLUCOSE, CAPILLARY Status: Abnormal    Collection Time    04/04/13 7:35 AM   Result  Value  Range    Glucose-Capillary  124 (*)  70 - 99 mg/dL    Comment 1  Notify RN    GLUCOSE, CAPILLARY Status: Abnormal    Collection Time    04/04/13 11:46 AM   Result  Value  Range    Glucose-Capillary  202 (*)  70  - 99 mg/dL   GLUCOSE, CAPILLARY Status: Abnormal    Collection Time    04/04/13 3:24 PM   Result  Value  Range  Glucose-Capillary  129 (*)  70 - 99 mg/dL    Comment 1  Notify RN    GLUCOSE, CAPILLARY Status: Abnormal    Collection Time    04/05/13 12:34 AM   Result  Value  Range    Glucose-Capillary  137 (*)  70 - 99 mg/dL    Comment 1  Notify RN    CBC WITH DIFFERENTIAL Status: Abnormal    Collection Time    04/05/13 4:50 AM   Result  Value  Range    WBC  7.3  4.0 - 10.5 K/uL    RBC  3.37 (*)  4.22 - 5.81 MIL/uL    Hemoglobin  8.9 (*)  13.0 - 17.0 g/dL    HCT  40.9 (*)  81.1 - 52.0 %    MCV  81.0  78.0 - 100.0 fL    MCH  26.4  26.0 - 34.0 pg    MCHC  32.6  30.0 - 36.0 g/dL    RDW  91.4 (*)  78.2 - 15.5 %    Platelets  216  150 - 400 K/uL    Neutrophils Relative %  90 (*)  43 - 77 %    Neutro Abs  6.6  1.7 - 7.7 K/uL    Lymphocytes Relative  6 (*)  12 - 46 %    Lymphs Abs  0.4 (*)  0.7 - 4.0 K/uL    Monocytes Relative  4  3 - 12 %    Monocytes Absolute  0.3  0.1 - 1.0 K/uL    Eosinophils Relative  0  0 - 5 %    Eosinophils Absolute  0.0  0.0 - 0.7 K/uL    Basophils Relative  0  0 - 1 %    Basophils Absolute  0.0  0.0 - 0.1 K/uL   COMPREHENSIVE METABOLIC PANEL Status: Abnormal    Collection Time    04/05/13 4:50 AM   Result  Value  Range    Sodium  135  135 - 145 mEq/L    Potassium  3.9  3.5 - 5.1 mEq/L    Chloride  102  96 - 112 mEq/L    CO2  23  19 - 32 mEq/L    Glucose, Bld  184 (*)  70 - 99 mg/dL    BUN  30 (*)  6 - 23 mg/dL    Creatinine, Ser  9.56  0.50 - 1.35 mg/dL    Calcium  8.7  8.4 - 10.5 mg/dL    Total Protein  5.2 (*)  6.0 - 8.3 g/dL    Albumin  2.3 (*)  3.5 - 5.2 g/dL    AST  26  0 - 37 U/L    ALT  44  0 - 53 U/L    Alkaline Phosphatase  186 (*)  39 - 117 U/L    Total Bilirubin  0.3  0.3 - 1.2 mg/dL    GFR calc non Af Amer  >90  >90 mL/min    GFR calc Af Amer  >90  >90 mL/min    Comment:  (NOTE)     The eGFR has been calculated using the CKD EPI  equation.     This calculation has not been validated in all clinical situations.     eGFR's persistently <90 mL/min signify possible Chronic Kidney     Disease.   GLUCOSE, CAPILLARY Status: Abnormal    Collection Time    04/05/13 8:06 AM  Result  Value  Range    Glucose-Capillary  134 (*)  70 - 99 mg/dL    Comment 1  Documented in Chart     Comment 2  Notify RN      No results found.  Post Admission Physician Evaluation:  1. Functional deficits secondary to metastatic prostate cancer to the thoracic and lumbar spine s/p T4-5 surgical decompression. 2. Patient is admitted to receive collaborative, interdisciplinary care between the physiatrist, rehab nursing staff, and therapy team. 3. Patient's level of medical complexity and substantial therapy needs in context of that medical necessity cannot be provided at a lesser intensity of care such as a SNF. 4. Patient has experienced substantial functional loss from his/her baseline which was documented above under the "Functional History" and "Functional Status" headings. Judging by the patient's diagnosis, physical exam, and functional history, the patient has potential for functional progress which will result in measurable gains while on inpatient rehab. These gains will be of substantial and practical use upon discharge in facilitating mobility and self-care at the household level. 5. Physiatrist will provide 24 hour management of medical needs as well as oversight of the therapy plan/treatment and provide guidance as appropriate regarding the interaction of the two. 6. 24 hour rehab nursing will assist with bladder management, bowel management, safety, skin/wound care, disease management, medication administration, pain management and patient education and help integrate therapy concepts, techniques,education, etc. 7. PT will assess and treat for/with: Lower extremity strength, range of motion, stamina, balance, functional mobility, safety,  adaptive techniques and equipment, NMR, pain mgt, spine precautions, education. Goals are: supervision to min assist at a wheelchair level. 8. OT will assess and treat for/with: ADL's, functional mobility, safety, upper extremity strength, adaptive techniques and equipment, NMR, pain mgt, spine precautions, education. Goals are: supervision to mod assist. 9. SLP will assess and treat for/with: n/a. Goals are: n/a. 10. Case Management and Social Worker will assess and treat for psychological issues and discharge planning. 11. Team conference will be held weekly to assess progress toward goals and to determine barriers to discharge. 12. Patient will receive at least 3 hours of therapy per day at least 5 days per week. 13. ELOS: 3 weeks  14. Prognosis: good   Medical Problem List and Plan:  1. 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 DVT Prophylaxis/Anticoagulation: Subcutaneous heparin. Monitor platelet counts any signs of bleeding. Check vascular studies on admit. 3. Pain Management: Duragesic patch 75 mcg, hydrocodone and robaxin as needed. Monitor with increased mobility   -adjust regimen as needed for comfort and to allow participation with therapies 4. Neuropsych: This patient is capable of making decisions on his own behalf.  5. Acute blood loss anemia. Latest hemoglobin 8.9 and monitored. Followup CBC  6. Hypertension. Cardura 8 mg each bedtime, hydrochlorothiazide 25 mg daily, lisinopril 10 mg daily.  7. Diabetes mellitus with peripheral neuropathy. Hemoglobin A1c is 6. Monitor closely while on Decadron therapy. NovoLog Mix 70/30 20 units daily. Check blood sugars a.c. and at bedtime. 8. Oncology. XRT is underway for adjunctive treatment of his L1,L3, and L5 lesions. XRT will be after therapies each day.  -decadron taper.  -I'm unsure of total number of radiation treatments at this point--follow up with  rad-onc   Ranelle Oyster, MD, [facility] Physical Medicine & Rehabilitation   04/05/2013

## 2013-04-05 NOTE — Progress Notes (Signed)
Rehab admissions - I met with patient and his wife this am.  I gave them rehab booklets and answered questions about rehab.  I have authorization for acute inpatient rehab admission.  Bed available and will plan to admit to inpatient rehab today.  Call me for questions.  #[phone]

## 2013-04-05 NOTE — PMR Pre-admission (Signed)
PMR Admission Coordinator Pre-Admission Assessment  Patient: Andrew Nelson is an 62 y.o., male MRN: [id number] DOB: [birth year] Height: 5' 7.5" (171.5 cm) Weight: 74.481 kg (164 lb 3.2 oz)              Insurance Information HMO:     PPO: Yes     PCP:       IPA:       80/20:       OTHER:  Group [id number]  PRIMARY: BCBS [place] of [place]      Policy#: [id number]      Subscriber: Britt Bolognese CM Name: Louretta Shorten      Phone#:       Fax#: [phone] Pre-Cert#: [id number]   Precert X 7 days with update due 03/11/13      Employer: Retired Benefits:  Phone #: [phone]     Name: Sharin Mons. 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 right-handed male with history of known metastatic prostate carcinoma status post previous T6-7 and L5 laminectomies for resection of epidural tumor 03/14/2013 and was discharged to home 03/16/2013. Patient was seen by his oncologist [name] 03/29/2013 with patient reporting acute onset of lower extremity weakness to the point he could not get out of bed. Patient's prior functional status was able to ambulate with minimal effort using a walker while at home. He was admitted 03/29/2013 for further evaluation. Emergent workup demonstrated evidence of marked progression of his epidural tumor at T4-5 level causing severe spinal cord cauda equina compression. He had progression of his dorsal L3 epidural tumor as well  and the epidural tumor behind the body of L5 also appear to be progressing.. patient underwent T4-T5 laminectomy resection of epidural tumor 03/30/2013 per Dr. Jordan Likes. Postoperative pain management. Subcutaneous heparin for DVT prophylaxis. Radiation therapy initiated 04/03/2013 per [name] x30 sessions. Physical and occupational therapy evaluations completed. M.D.has requested physical medicine rehabilitation consult to consider inpatient rehabilitation services.    Past Medical History  Past Medical History  Diagnosis Date  . Diabetes mellitus   . Hypertension   . Prostate ca 6/07    prostate ca dx  . Allergy   . Anemia   . CAD (coronary artery disease)   . Blood transfusion without reported diagnosis     May 2013  . History of radiation therapy 09/19/12-10/10/2012    T-7-T9,35Gy/60fx,Prescaral mass 35Gy/48fx  . History of radiation therapy 05/2006-06/2006    salvage prostate rad tx 68.4Gy  . Bone cancer     prostate ca with mets to spine    Family History  family history includes Cancer in his other; Colon cancer in his paternal grandfather; Prostate cancer in his father; Stroke in his other.  Prior Rehab/Hospitalizations: No previous rehab admissions.   Current Medications  Current facility-administered medications:0.9 %  sodium chloride  infusion, 250 mL, Intravenous, Continuous, Temple Pacini, MD, Last Rate: 10 mL/hr at 03/31/13 0800, 250 mL at 03/31/13 0800;  acetaminophen (TYLENOL) suppository 650 mg, 650 mg, Rectal, Q4H PRN, Temple Pacini, MD;  acetaminophen (TYLENOL) tablet 650 mg, 650 mg, Oral, Q4H PRN, Temple Pacini, MD alum & mag hydroxide-simeth (MAALOX/MYLANTA) 200-200-20 MG/5ML suspension 30 mL, 30 mL, Oral, Q6H PRN, Temple Pacini, MD;  bisacodyl (DULCOLAX) suppository 10 mg, 10 mg, Rectal, Daily PRN, Temple Pacini, MD;  dexamethasone (DECADRON) injection 4 mg, 4 mg, Intravenous, Q6H, Temple Pacini, MD, 4 mg at 04/05/13 1610;  dexamethasone (DECADRON) tablet 4 mg, 4 mg,  Oral, Q6H, Temple Pacini, MD, 4 mg at 04/03/13 9604 diazepam (VALIUM) tablet 5-10 mg, 5-10 mg, Oral, Q6H PRN, Temple Pacini, MD, 5 mg at 03/30/13 2220;  doxazosin (CARDURA) tablet 8 mg, 8 mg, Oral, QHS, Doree Albee, MD, 8 mg at 04/04/13 2108;  fentaNYL (DURAGESIC - dosed mcg/hr) 50 mcg, 50 mcg, Transdermal, Q72H, Doree Albee, MD, 50 mcg at 04/03/13 1045;  fentaNYL (DURAGESIC - dosed mcg/hr) patch 25 mcg, 25 mcg, Transdermal, Q72H, Doree Albee, MD, 25 mcg at 04/03/13 1045 heparin injection 5,000 Units, 5,000 Units, Subcutaneous, Q8H, Doree Albee, MD, 5,000 Units at 04/05/13 [id number];  hydrochlorothiazide (HYDRODIURIL) tablet 25 mg, 25 mg, Oral, Daily, Doree Albee, MD, 25 mg at 04/05/13 1025;  HYDROcodone-acetaminophen (NORCO/VICODIN) 5-325 MG per tablet 1-2 tablet, 1-2 tablet, Oral, Q4H PRN, Temple Pacini, MD, 2 tablet at 04/05/13 0640 HYDROmorphone (DILAUDID) injection 0.5-1 mg, 0.5-1 mg, Intravenous, Q2H PRN, Temple Pacini, MD, 0.5 mg at 04/04/13 1322;  insulin aspart (novoLOG) injection 0-20 Units, 0-20 Units, Subcutaneous, TID WC, Temple Pacini, MD, 3 Units at 04/05/13 0830;  insulin aspart protamine- aspart (NOVOLOG MIX 70/30) injection 20 Units, 20 Units, Subcutaneous, Q supper, Doree Albee, MD, 20 Units at 04/04/13 1626 lidocaine-prilocaine (EMLA) cream 1 application, 1 application, Topical, Daily PRN, Doree Albee, MD;  lisinopril (PRINIVIL,ZESTRIL) tablet 10 mg, 10 mg, Oral, Daily, Doree Albee, MD, 10 mg at 04/05/13 1025;  menthol-cetylpyridinium (CEPACOL) lozenge 3 mg, 1 lozenge, Oral, PRN, Temple Pacini, MD;  morphine 2 MG/ML injection 1 mg, 1 mg, Intravenous, Q3H PRN, Leda Gauze, NP, 1 mg at 03/31/13 1456 ondansetron (ZOFRAN) injection 4 mg, 4 mg, Intravenous, Q4H PRN, Temple Pacini, MD;  oxyCODONE-acetaminophen (PERCOCET/ROXICET) 5-325 MG per tablet 1-2 tablet, 1-2 tablet, Oral, Q4H PRN, Temple Pacini, MD, 2 tablet at 04/05/13 1025;  pantoprazole (PROTONIX) EC tablet 40 mg, 40 mg, Oral,  QHS, Temple Pacini, MD, 40 mg at 04/04/13 2108;  phenol (CHLORASEPTIC) mouth spray 1 spray, 1 spray, Mouth/Throat, PRN, Temple Pacini, MD polyethylene glycol (MIRALAX / GLYCOLAX) packet 17 g, 17 g, Oral, Daily PRN, Temple Pacini, MD;  senna (SENOKOT) tablet 8.6 mg, 1 tablet, Oral, BID, Temple Pacini, MD, 8.6 mg at 04/05/13 1025;  sodium chloride 0.9 % injection 10-40 mL, 10-40 mL, Intracatheter, PRN, Temple Pacini, MD, 10 mL at 04/02/13 0901 Facility-Administered Medications Ordered in Other Encounters: heparin lock flush 100 unit/mL, 500 Units, Intravenous, Once, [name], MD;  sodium chloride 0.9 % injection 10 mL, 10 mL, Intravenous, PRN, [name], MD  Patients Current Diet: General  Precautions / Restrictions Precautions Precautions: Fall;Back Precaution Booklet Issued: Yes (comment) Precaution Comments: able to recall 3/3 precautions Restrictions Weight Bearing Restrictions: No   Prior Activity Level Household: Homebound except for MD appointment most recently.  Home Assistive Devices /  Equipment Home Assistive Devices/Equipment: Eyeglasses;Walker (specify type);Cane (specify quad or straight);Bedside commode/3-in-1 Home Equipment: Shower seat;Bedside commode;Other (comment);Walker - 2 wheels;Adaptive equipment  Prior Functional Level Prior Function Level of Independence: Independent  Current Functional Level Cognition  Overall Cognitive Status: Within Functional Limits for tasks assessed Orientation Level: Oriented X4    Extremity Assessment (includes Sensation/Coordination)          ADLs  Eating/Feeding: Performed;Independent Where Assessed - Eating/Feeding: Bed level Upper Body Dressing: Performed;Minimal assistance Where Assessed - Upper Body Dressing: Supported sitting Lower Body Dressing: Performed;+1 Total assistance Where Assessed - Lower Body Dressing: Supported sitting Toilet Transfer: Performed;+2 Total assistance Toilet Transfer: Patient Percentage:  40% Statistician Method: Surveyor, minerals: Materials engineer and Hygiene: Performed;Supervision/safety Where Assessed - Engineer, mining and Hygiene: Sit on 3-in-1 or toilet Equipment Used: Gait belt;Rolling walker Transfers/Ambulation Related to ADLs: +2 total assist for sit<>stand and for SPT from bed<>3n1.  Pt requriring assist due to bil LE weakness and decreased proprioception and sensation in LEs.  When attempting to transfer from 3n1<>chair, pt's RLE extending back under 3n1 (unsure if this is tone vs poor proprioception), RN then arrived to assist.  Pt stood with +2 total assist from 3n1 with PT/OT while RN removed 3n1 and placed chair behind pt. ADL Comments: Pt in bed complaining of pain. OT provided education on repositioning in bed to decrease pain. Pt educated on supportive position in side lying, as well as supine.  Pt declined further mobility at this time due to pain.      Mobility  Bed Mobility: Rolling Right;Supine to Sit;Right Sidelying to Sit Rolling Right: 4: Min assist Rolling Left: 4: Min assist Right Sidelying to Sit: 3: Mod assist Left Sidelying to Sit: 2: Max assist;HOB flat Sitting - Scoot to Edge of Bed: 4: Min assist    Transfers  Transfers: Sit to Stand;Stand to Sit Sit to Stand: 1: +2 Total assist;From bed;Without upper extremity assist Sit to Stand: Patient Percentage: 40% Stand to Sit: 1: +2 Total assist Stand to Sit: Patient Percentage: 40%    Ambulation / Gait / Stairs / Wheelchair Mobility  Ambulation/Gait Ambulation/Gait Assistance: Not tested (comment) (Pt using stedy for transfer assist) Stairs: No Wheelchair Mobility Wheelchair Mobility: No    Posture / Balance Static Sitting Balance Static Sitting - Balance Support: No upper extremity supported;Feet supported Static Sitting - Level of Assistance: 5: Stand by assistance Static Sitting - Comment/# of Minutes: pt encouraged  to extend upper trunk for core activation    Special needs/care consideration BiPAP/CPAP No CPM No Continuous Drip IV No Dialysis No        Life Vest No Oxygen No Special Bed No Trach Size No Wound Vac (area) No      Skin No                             Bowel mgmt: Had BM 04/04/13 Bladder mgmt: Voiding in urinal with occasional accidents Diabetic mgmt yes, on insulin 70/30 at home.  Checks his sugars daily    Previous Home Environment Living Arrangements: Spouse/significant other Available Help at Discharge: Available 24 hours/day;Family Type of Home: House Home Layout: Two level;Bed/bath upstairs Alternate Level Stairs-Rails: Can reach both Alternate Level Stairs-Number of Steps: 12 Home Access: Stairs to enter Entrance Stairs-Rails: Right Entrance Stairs-Number of Steps: 3 Bathroom Shower/Tub: Health visitor: Standard Home Care Services: No  Discharge Living Setting  Plans for Discharge Living Setting: Patient's home;House;Lives with (comment) (Lives with wife and his dog.) Type of Home at Discharge: House Discharge Home Layout: Two level;1/2 bath on main level;Bed/bath upstairs Alternate Level Stairs-Number of Steps: 12 Discharge Home Access: Stairs to enter Entrance Stairs-Number of Steps: 3 Does the patient have any problems obtaining your medications?: No  Social/Family/Support Systems Patient Roles: Spouse;Parent Contact Information: Oncologist - wife Anticipated Caregiver: Chyrl Civatte - wife Anticipated Caregiver's Contact Information: Chyrl Civatte - wife [phone] Ability/Limitations of Caregiver: Wife does not work and can Geneticist, molecular Availability: 24/7 Discharge Plan Discussed with Primary Caregiver: Yes Is Caregiver In Agreement with Plan?: Yes Does Caregiver/Family have Issues with Lodging/Transportation while Pt is in Rehab?: No Does have a son and daughter locally who have their own families.  Goals/Additional Needs Patient/Family Goal  for Rehab: PT/OT mod I to min A, no ST needs Expected length of stay: 2-3 weeks Cultural Considerations: African [employer]ist ep[employer] Dietary Needs: Regular, thin liquids Equipment Needs: TBD Pt/Family Agrees to Admission and willing to participate: Yes Program Orientation Provided & Reviewed with Pt/Caregiver Including Roles  & Responsibilities: Yes (Gave booklets about rehab to wife, answered questions.)   Decrease burden of Care through IP rehab admission: Decrease number of caregivers, Bowel and bladder program and Patient/family education   Possible need for SNF placement upon discharge:  Yes, but hopeful he can return to functional level PTA and go home with wife.   Patient Condition: This patient's medical and functional status has changed since the consult dated: 04/04/13 in which the Rehabilitation Physician determined and documented that the patient's condition is appropriate for intensive rehabilitative care in an inpatient rehabilitation facility. See "History of Present Illness" (above) for medical update. Functional changes are: Currently requiring total assist +2 40 % for transfers. . Patient's medical and functional status update has been discussed with the Rehabilitation physician and patient remains appropriate for inpatient rehabilitation. Will admit to inpatient rehab today.  Preadmission Screen Completed By:  Trish Mage, 04/05/2013 11:01 AM ______________________________________________________________________   Discussed status with Dr. Riley Kill on 04/05/13 at 1130 and received telephone approval for admission today.  Admission Coordinator:  Trish Mage, time1130/Date08/27/14

## 2013-04-05 NOTE — Progress Notes (Signed)
CSW received notification from [facility] that insurance authorization was received for acute inpatient rehab admission and bed available today for pt transfer to [facility].  [facility] planned to notify MD.   CSW notified RNCM.  No further social work needs identified at this time.  CSW signing off.    [name], MSW, LCSWA  Clinical Social Work [phone]

## 2013-04-05 NOTE — Discharge Summary (Signed)
Physician Discharge Summary  Andrew Nelson ZOX:[id number] DOB: 1950/08/24 DOA: 03/29/2013  PCP: [name], MD  Admit date: 03/29/2013 Discharge date: 04/05/2013  Recommendations for Outpatient Follow-up:  1. The patient is being discharged to the inpatient rehabilitation unit at [facility] for further treatment.  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  Diet recommendation: Low-sodium, heart healthy, carbohydrate modified.  History of present illness:  Andrew Nelson is an 62 y.o. male with a PMH of metastatic castration resistant prostate cancer, status post radiation therapy and chemotherapy with Ellie Lunch, which is now on hold, status post T6-T7 laminectomy and resection of epidural tumor, left L5 decompressive hemilaminectomy with resection of epidural tumor on 03/13/2013 who was admitted on 03/29/2013 with progressively worsening lower extremity weakness. 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 He has now been initiated on radiation therapy.  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  -Radiation therapy started 04/03/2013; tolerating well.  -Continue decadron, which has been switched to the by mouth route at discharge.  -Resume home dose of oxycodone at discharge, if IV access still  available, can continue Dilaudid-HP for breakthrough pain.  -Discharge to CIR.  Active Problems:  DM w/o Complication Type I  -CBGs 124-202.  -Continue insulin 70/30 mixed 20 units at bedtime.  -Continue NovoLog sliding scale. Resume metformin at discharge. Prostate cancer with spinal metastasis  -Management per oncology; now receiving RT to lumbar area, started 04/03/2013.  -Continue Decadron.  -Continue doxazosin 8 mg daily.  Hypertension  -Continue HCTZ 25 mg daily and lisinopril 10 mg daily.  -Blood pressure controlled.  Anemia of chronic disease  -Secondary to history of prostate cancer.  -Hemoglobin stable. No indications for transfusion.  Hyponatremia  -Likely secondary to dehydration, corrected with hydration.  Moderate protein calorie malnutrition  -Secondary to cancer related cachexia.  Procedures:  T4-T5 laminectomy and resection of epidural tumor with microdissection done on 03/30/2013.  Medical Consultants:  [name], Radiation oncology  Dr. Harold Hedge, Physical medicine and rehabilitation  Dr. Coletta Memos, Neurosurgery  [name], Oncology   Discharge Exam: Filed Vitals:   04/05/13 1013  BP: 124/52  Pulse: 77  Temp: 98.3 F (36.8 C)  Resp: 16   Filed Vitals:   04/04/13 2100 04/05/13 0146 04/05/13 0510 04/05/13 1013  BP:  106/54 123/55 124/52  Pulse: 88 61 72 77  Temp:  98.6 F (37 C) 98.6 F (37 C) 98.3 F (36.8 C)  TempSrc:  Oral Oral Oral  Resp:  16 16 16   Height:      Weight:   74.481 kg (164 lb 3.2 oz)   SpO2:  100% 100% 98%    Gen:  NAD Cardiovascular:  RRR, No M/R/G Respiratory: Lungs CTAB Gastrointestinal: Abdomen soft, NT/ND with normal active bowel sounds. Extremities: No C/E/C   Discharge Instructions  Discharge Orders  Future Appointments Provider Department Dept Phone   04/05/2013 1:40 PM [facility] [phone]   04/06/2013 11:50 AM [facility] [place] [facility] [phone]   04/07/2013 2:40 PM [facility] [place] [facility] [phone]   04/11/2013 11:20 AM [facility] [phone]   04/12/2013 11:10 AM [facility] [place] [facility] [phone]   04/13/2013 11:20 AM [facility] [phone]   04/14/2013 11:20 AM [facility] [phone]   04/17/2013 11:20 AM [facility] [phone]   05-27-13 3:30 PM [name], MD [facility] MEDICAL ONCOLOGY [phone]   06/01/2013 2:45 PM [name], MD [facility] [phone]   Future Orders Complete By Expires   Call MD for:  extreme fatigue  As directed    Call MD for:  persistant nausea and vomiting  As directed    Call MD for:  temperature >100.4  As directed    Call MD for:  As directed    Scheduling Instructions:     Worsening weakness, loss of bladder or bowel control   Diet - low sodium heart healthy  As directed    Diet Carb Modified  As directed    Discharge instructions  As directed    Comments:     You were cared for by Dr. Hillery Aldo  (a hospitalist) during your hospital stay. If you have any questions about your discharge medications or the care you received while you were in the hospital after you are discharged, you can call the unit and ask to speak with the hospitalist on call if the hospitalist that took care of you is not available. Once you are discharged, your primary care physician will handle any further medical issues. Please note that NO REFILLS for any discharge medications will be authorized once you are discharged, as it is imperative that you return to your primary care physician (or establish a relationship with a  primary care physician if you do not have one) for your aftercare needs so that they can reassess your need for medications and monitor your lab values.  Any outstanding tests can be reviewed by your PCP at your follow up visit.  It is also important to review any medicine changes with your PCP.  Please bring these d/c instructions with you to your next visit so your physician can review these changes with you.  If you do not have a primary care physician, you can call [phone] for a physician referral.  It is highly recommended that you obtain a PCP for hospital follow up.   Increase activity slowly  As directed    Walk with assistance  As directed    Walker   As directed        Medication List         ACCU-CHEK AVIVA PLUS test strip  Generic drug:  glucose blood  USE AS DIRECTED     bisacodyl 10 MG suppository  Commonly known as:  DULCOLAX  Place 1 suppository (10 mg total) rectally daily as needed.     dexamethasone 4 MG tablet  Commonly known as:  DECADRON  Take 1 tablet (4 mg total) by mouth every 6 (six) hours.     diazepam 5 MG  tablet  Commonly known as:  VALIUM  Take 1-2 tablets (5-10 mg total) by mouth every 6 (six) hours as needed.     doxazosin 8 MG tablet  Commonly known as:  CARDURA  Take 8 mg by mouth at bedtime.     fentaNYL 25 MCG/HR patch  Commonly known as:  DURAGESIC - dosed mcg/hr  Place 1 patch (25 mcg total) onto the skin every 3 (three) days. Pt put patch on Sunday. Pt takes a total of 75mg  dose     fentaNYL 50 MCG/HR  Commonly known as:  DURAGESIC - dosed mcg/hr  Place 1 patch onto the skin every 3 (three) days. Pt put patch on Sunday. Pt takes a total of 75 mg dose     heparin 5000 UNIT/ML injection  Inject 1 mL (5,000 Units total) into the skin every 8 (eight) hours.     hydrochlorothiazide 25 MG tablet  Commonly known as:  HYDRODIURIL  Take 25 mg by mouth daily.     HYDROmorphone 1 MG/ML Soln injection  Commonly known as:  DILAUDID  Inject  0.5-1 mLs (0.5-1 mg total) into the vein every 2 (two) hours as needed for severe pain.     insulin aspart 100 UNIT/ML injection  Commonly known as:  novoLOG  Inject 0-20 Units into the skin 3 (three) times daily with meals.     insulin aspart protamine- aspart (70-30) 100 UNIT/ML injection  Commonly known as:  NOVOLOG MIX 70/30  Inject 20 Units into the skin daily with supper.     leuprolide 30 MG injection  Commonly known as:  LUPRON  Inject 30 mg into the muscle every 4 (four) months. Pt had 2 weeks ago     lidocaine-prilocaine cream  Commonly known as:  EMLA  Apply 1 application topically daily as needed (APPLY TO PORTA-CATH SITE 30 MINS TO 2 HOURS PRIOR TO TREATMENT & COVER WITH PLASTIC WRAP).     lisinopril 10 MG tablet  Commonly known as:  PRINIVIL,ZESTRIL  Take 10 mg by mouth daily.     metFORMIN 850 MG tablet  Commonly known as:  GLUCOPHAGE  Take 850 mg by mouth 2 (two) times daily with a meal.     oxyCODONE-acetaminophen 10-325 MG per tablet  Commonly known as:  PERCOCET  Take 1-2 tablets by mouth every 4 (four) hours as needed for pain.     pantoprazole 40 MG tablet  Commonly known as:  PROTONIX  Take 1 tablet (40 mg total) by mouth at bedtime.     polyethylene glycol packet  Commonly known as:  MIRALAX / GLYCOLAX  Take 17 g by mouth daily as needed.     senna 8.6 MG Tabs tablet  Commonly known as:  SENOKOT  Take 1 tablet (8.6 mg total) by mouth 2 (two) times daily.          The results of significant diagnostics from this hospitalization (including imaging, microbiology, ancillary and laboratory) are listed below for reference.    Significant Diagnostic Studies: Dg Chest 2 View  03/13/2013   *RADIOLOGY REPORT*  Clinical Data: Back pain, chest pain.  CHEST - 2 VIEW  Comparison: 02/23/2012  Findings: Right Port-A-Cath remains in place, unchanged.  Linear atelectasis in the lung bases.  Heart is borderline in size.  Small bilateral pleural effusions.  Vague  opacity in the left mid lung.  Cannot exclude pulmonary nodule or pleural based nodule.  Consider further evaluation with chest CT.  IMPRESSION: Bibasilar atelectasis.  Small  effusions.  Questionable pulmonary or pleural nodule in the left mid lung peripherally.  Recommend further evaluation with chest CT.   Original Report Authenticated By: Charlett Nose, M.D.   Ct Angio Chest Pe W/cm &/or Wo Cm  03/13/2013   *RADIOLOGY REPORT*  Clinical Data: Chest pain with tachycardia and shortness of breath. Question pulmonary embolism.  History of prostate cancer.  CT ANGIOGRAPHY CHEST  Technique:  Multidetector CT imaging of the chest using the standard protocol during bolus administration of intravenous contrast. Multiplanar reconstructed images including MIPs were obtained and reviewed to evaluate the vascular anatomy.  Contrast: 60mL OMNIPAQUE IOHEXOL 350 MG/ML SOLN  Comparison: Chest CT 08/08/2012.  Findings: The pulmonary arteries appear satisfactorily opacified with contrast.  There is no evidence of acute pulmonary embolism. There is stable relatively mild atherosclerosis of the aorta, great vessels and coronary arteries.  A right IJ Port-A-Cath extends to the superior aspect of the right atrium.  There has been interval development of small bilateral pleural effusions as well as a small to moderate pericardial effusion. Small precarinal and subcarinal lymph nodes are unchanged.  There is linear atelectasis at both lung bases.  No lung mass or endobronchial lesion is identified.  However, there is a pleural- based mass involving the left chest wall anteriorly adjacent to the fourth rib.  There are multiple progressive lytic lesions throughout the spine and ribs consistent with metastatic disease. There are new superior endplate compression deformities at T5 and T6 which may be pathologic. There is suspicion of posterior epidural tumor on the left at T6-T7 (axial image 31 and sagittal image 77).  Images through the  upper abdomen demonstrate multiple enlarging low density liver lesions highly suspicious for metastatic disease.  IMPRESSION:  1.  No evidence of acute pulmonary embolism. 2.  Strong concern of progressive widespread metastatic disease to the bones, pleura and liver.  There are possible pathologic fractures at T5 and T6 with suspicion of posterior epidural tumor on the left at T6-T7, potentially causing cord compression. Thoracic MRI suggested for further evaluation. 3.  New bilateral pleural effusions and small to moderate pericardial effusion.  Critical Value/emergent results were called by telephone at the time of interpretation on 03/13/2013 at 1830 hours to Dr. Bernette Mayers, who verbally acknowledged these results.   Original Report Authenticated By: Carey Bullocks, M.D.   Mr Thoracic Spine Wo Contrast  03/30/2013   CLINICAL DATA:  62 year old male with prostate cancer known to be widely metastatic to bone. History of thoracic cord compression status post decompression earlier this month. Acute paraplegia x1 day. Sudden onset severe lower extremity weakness.  EXAM: MRI THORACIC AND LUMBAR SPINE WITHOUT CONTRAST  TECHNIQUE: Multiplanar and multiecho pulse sequences of the thoracic and lumbar spine were obtained without intravenous contrast.  COMPARISON:  Preoperative thoracic and lumbar MRI 03/13/2013.  FINDINGS: MR THORACIC SPINE FINDINGS  Limited sagittal imaging of the cervical spine again suggest diffuse cervical vertebral tumor involvement.  Interval diffuse thoracic spine bony metastatic disease, less pronounced at the T7, T8, and T9 vertebral bodies as before. Interval thoracic spine decompression centered at the T6-T7 level. Small volume of posterior postoperative paraspinal fluid. No mass effect on the thecal sac at this level.  Confluent right greater than left epidural tumor with cord compression at the T5 level, appears progressed since 03/13/2013. Epidural tumor continues to the upper aspect of the  decompression level. Moderate to severe foraminal involvement by tumor at the bilateral T5 and right T4 neural foramen.  Otherwise epidural/ extraosseous  tumor in the thoracic spine is most pronounced at the right T1 neural foramen/costovertebral junction, the posterior T1 epidural space (series 8, image 3), right T2 neural foramen, and left T11 pedicle/neural foramen. No additional cord compression.  No definite abnormal spinal cord signal/edema identified. Conus medullaris is described in the lumbar section below.  Additional rib associated and paraspinal tumor in the thorax (on the left at T11-T12, on the right at T8, see image 8).  Lumbar findings are described below. 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 There is a 2 cm area of increased signal in the right hepatic lobe which might represent liver metastatic disease (series 8, image 44).  MR LUMBAR SPINE FINDINGS  Diffuse lumbar spine metastatic disease.  Progression of epidural tumor arising from the right L1 pedicle and posterior elements, with mild spinal stenosis now at the level of the conus (series 15, image 7). Mild involvement of the right T1 neural foramen not significantly changed. No signal abnormality in the conus.  Dorsal epidural tumor at the L3 level along with increased epidural lipomatosis here now results in mild L3 spinal stenosis.  Bulky ventral epidural tumor re- identified at the L5 level with increased spinal stenosis at this level, now severe. Interval left hemilaminectomy with a small volume of postoperative fluid in the laminectomy space. Severe epidural tumor involvement of the left L4 neural foramen again noted, in conjunction with ventral L4 epidural tumor which is not significantly changed. Severe involvement of the left L5 neural foramen is stable. Severe involvement of the right L5 neural foramen appears progressed.  Sacral spinal canal remains patent. Stable visualized abdominal viscera.  Postoperative changes in the left lower paraspinal soft tissues.  IMPRESSION: MR THORACIC SPINE IMPRESSION  1. Progression of thoracic epidural tumor centered at the T5 vertebral level with cord compression. No definite cord signal abnormality.  2. Interval thoracic spine decompression centered at T6-T7. No adverse features.  3. Thoracic paraspinal and foraminal tumor elsewhere but no additional thoracic cord compression.  MR LUMBAR SPINE IMPRESSION  1. Progressed epidural tumor at the L1 level emanating from the right pedicle/ posterior elements with new spinal stenosis corresponding to the level of the conus medullaris. No conus signal abnormality.  2. Increased spinal stenosis at L3, mild and related to both dorsal epidural tumor and epidural lipomatosis. 3. Mild progression of epidural and foraminal tumor centered at the L5 level. Spinal stenosis at L5 appears increased and severe despite interval left laminectomy changes at this level.  I spoke to Dr. Elisabeth Pigeon on the [facility] hospitalist team at the time of this report. She advised that she had already discussed this exam with Dr. Jordan Likes of neurosurgery who is advising transfer of the patient to [facility] for an emergent surgical spine decompression.   Electronically Signed   By: Augusto Gamble   On: 03/30/2013 15:19   Mr Thoracic Spine Wo Contrast  03/13/2013   *RADIOLOGY REPORT*  Clinical Data:  Prostate cancer, evaluate epidural tumor  MRI THORACIC SPINE WITHOUT CONTRAST LUMBAR SPINE WITHOUT AND WITH CONTRAST  Technique:  Multiplanar and multiecho pulse sequences of the thoracic and lumbar spine were obtained without and with intravenous contrast.  Contrast: 16mL MULTIHANCE GADOBENATE DIMEGLUMINE 529 MG/ML IV SOLN  Comparison:   Prior CT performed earlier on the same date.  MRI THORACIC SPINE  Findings: The vertebral bodies are normally aligned with preservation of the normal thoracic kyphosis. Bone marrow signal intensity is markedly abnormal with  diffuse osseous metastatic disease seen throughout the thoracic spine, essentially involving all levels. Postradiation changes are seen within the T7, T8, and T9 vertebral bodies.  There is mild vertebral body height loss at the T6 level which appears chronic in nature.  Overall, there is approximately 20% of height loss at this level.  Otherwise, vertebral bodies are preserved.  At the T4-5 level, there is epidural tumor within the right lateral aspect of the thecal sac extending into the right T4-5 neural foramen. There is resultant moderate right neural foraminal stenosis without significant central canal stenosis.  Tumor is seen involving several right sided ribs.  Slightly inferiorly at the T5 level, there is abnormal convex contour of the T5 vertebral body with classic curtain deformity of the ventral epidural space, consistent with epidural tumor (series 8, image 21).  There is resultant moderate canal stenosis with slight displacement of the spinal cord dorsally at this level.  No cord signal changes are seen.  Epidural tumor at this level extends into the left T5 foramen and along the right lateral aspect of T5 anteriorly, and along the right posterior fifth rib posteriorly and laterally (series 8, image 21). There is tumor extension into the posterior elements at this level, involving the bilateral pedicles and T4-5 facet joints, right greater than left.  There is secondary moderate right foraminal stenosis at T5-6.  Prominent tumor present within the left posterior elements of T7 is visualized, with extension into the left posterolateral epidural space (series 8, image 26).  The spinal cord is displaced anteriorly and to the right with secondary severe central canal stenosis. There is no associated cord signal changes.  Small amount of epidural tumor with cord deformity is seen inferiorly at the level of T10 (series 8, image 42).  No significant foraminal stenosis.  There is mild central canal narrowing.   There is moderate left neural foraminal stenosis of the T11-12 left neural foramen due to particular enlargement from osseous metastatic disease.  MRI LUMBAR SPINE  Findings: The vertebral bodies are normally aligned with preservation of the normal lumbar lordosis.  Diffusely abnormal marrow signal intensity with innumerable osseous metastasis are again seen throughout the lumbar spine as well.  At the level of L1, there is epidural tumor within the right lateral epidural space abutting the right aspect of the spinal cord and resulting in mild canal stenosis.  There is tumor extension into the right neural foramen at L1-2 with nerve compression. Metastatic enlargement of the bilateral pedicles of L1 also contributes foraminal narrowing.  Inferiorly at the level of L3 - 4, there is degenerative disc bulge with a superimposed right foraminal disc protrusion which results and moderate right foraminal stenosis.  At the level of L4, there is abnormal epidural tumor within the left ventral aspect of the thecal sac with extension into the left lateral recess and left neural foramen.  There is secondary severe listhesis of the left L4-5 neural foramen. There is moderate canal stenosis at this level.  At the level of L5, a large abnormal epidural soft tissue masses present within the left ventral thecal sac which measures 1.8 x 1.5 x 2.2 cm in this compresses the left lateral recess and results in severe canal and left neural foraminal stenosis at L4-5.  The described epidural tumor enhances with post contrast images.  Innumerable osseous metastases are also seen within the sacrum and bilateral iliac wings.  Bilateral pleural effusions are present, right greater than left.  IMPRESSION: 1. Widespread osseous metastases  throughout the thoracic and lumbar spine.  2. 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  3.  Ventral epidural tumor at T5 with resultant moderate  canal stenosis and dorsal displacement of the spinal cord.  No cord signal changes.  4.  Epidural tumor within the left posterolateral epidural space at T6-T7 with secondary severe central canal stenosis and dorsal displacement of the spinal cord.  No cord signal changes.  5.  Soft tissue mass within the left ventral epidural space at the L5 with secondary severe canal and left neural foraminal stenosis at L4-5.  6. Epidural tumor within the right lateral epidural space at L1 with secondary mild canal and right neural foraminal stenosis.  7.  Bilateral pleural effusions, right greater than left.  Critical Value/emergent results were called by telephone at the time of interpretation on 03/13/2013 at 11:21 pm to Dr. Susy Frizzle, who verbally acknowledged these results.   Original Report Authenticated By: Rise Mu, M.D.   Mr Lumbar Spine Wo Contrast  03/30/2013   CLINICAL DATA:  62 year old male with prostate cancer known to be widely metastatic to bone. History of thoracic cord compression status post decompression earlier this month. Acute paraplegia x1 day. Sudden onset severe lower extremity weakness.  EXAM: MRI THORACIC AND LUMBAR SPINE WITHOUT CONTRAST  TECHNIQUE: Multiplanar and multiecho pulse sequences of the thoracic and lumbar spine were obtained without intravenous contrast.  COMPARISON:  Preoperative thoracic and lumbar MRI 03/13/2013.  FINDINGS: MR THORACIC SPINE FINDINGS  Limited sagittal imaging of the cervical spine again suggest diffuse cervical vertebral tumor involvement.  Interval diffuse thoracic spine bony metastatic disease, less pronounced at the T7, T8, and T9 vertebral bodies as before. Interval thoracic spine decompression centered at the T6-T7 level. Small volume of posterior postoperative paraspinal fluid. No mass effect on the thecal sac at this level.  Confluent right greater than left epidural tumor with cord compression at the T5 level, appears progressed since  03/13/2013. Epidural tumor continues to the upper aspect of the decompression level. Moderate to severe foraminal involvement by tumor at the bilateral T5 and right T4 neural foramen.  Otherwise epidural/ extraosseous tumor in the thoracic spine is most pronounced at the right T1 neural foramen/costovertebral junction, the posterior T1 epidural space (series 8, image 3), right T2 neural foramen, and left T11 pedicle/neural foramen. No additional cord compression.  No definite abnormal spinal cord signal/edema identified. Conus medullaris is described in the lumbar section below.  Additional rib associated and paraspinal tumor in the thorax (on the left at T11-T12, on the right at T8, see image 8).  Lumbar findings are described below. 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 There is a 2 cm area of increased signal in the right hepatic lobe which might represent liver metastatic disease (series 8, image 44).  MR LUMBAR SPINE FINDINGS  Diffuse lumbar spine metastatic disease.  Progression of epidural tumor arising from the right L1 pedicle and posterior elements, with mild spinal stenosis now at the level of the conus (series 15, image 7). Mild involvement of the right T1 neural foramen not significantly changed. No signal abnormality in the conus.  Dorsal epidural tumor at the L3 level along with increased epidural lipomatosis here now results in mild L3 spinal stenosis.  Bulky ventral epidural tumor re- identified at the L5 level with increased spinal stenosis at this level, now severe. Interval left hemilaminectomy with a small volume of postoperative  fluid in the laminectomy space. Severe epidural tumor involvement of the left L4 neural foramen again noted, in conjunction with ventral L4 epidural tumor which is not significantly changed. Severe involvement of the left L5 neural foramen is stable. Severe involvement of the right L5 neural foramen appears progressed.  Sacral spinal  canal remains patent. Stable visualized abdominal viscera. Postoperative changes in the left lower paraspinal soft tissues.  IMPRESSION: MR THORACIC SPINE IMPRESSION  1. Progression of thoracic epidural tumor centered at the T5 vertebral level with cord compression. No definite cord signal abnormality.  2. Interval thoracic spine decompression centered at T6-T7. No adverse features.  3. Thoracic paraspinal and foraminal tumor elsewhere but no additional thoracic cord compression.  MR LUMBAR SPINE IMPRESSION  1. Progressed epidural tumor at the L1 level emanating from the right pedicle/ posterior elements with new spinal stenosis corresponding to the level of the conus medullaris. No conus signal abnormality.  2. Increased spinal stenosis at L3, mild and related to both dorsal epidural tumor and epidural lipomatosis. 3. Mild progression of epidural and foraminal tumor centered at the L5 level. Spinal stenosis at L5 appears increased and severe despite interval left laminectomy changes at this level.  I spoke to Dr. Elisabeth Pigeon on the [facility] hospitalist team at the time of this report. She advised that she had already discussed this exam with Dr. Jordan Likes of neurosurgery who is advising transfer of the patient to [facility] for an emergent surgical spine decompression.   Electronically Signed   By: Augusto Gamble   On: 03/30/2013 15:19   Mr Lumbar Spine W Wo Contrast  03/14/2013   *RADIOLOGY REPORT*  Clinical Data:  Prostate cancer, evaluate epidural tumor  MRI THORACIC SPINE WITHOUT CONTRAST LUMBAR SPINE WITHOUT AND WITH CONTRAST  Technique:  Multiplanar and multiecho pulse sequences of the thoracic and lumbar spine were obtained without and with intravenous contrast.  Contrast: 16mL MULTIHANCE GADOBENATE DIMEGLUMINE 529 MG/ML IV SOLN  Comparison:   Prior CT performed earlier on the same date.  MRI THORACIC SPINE  Findings: The vertebral bodies are normally aligned with preservation of the normal thoracic kyphosis.  Bone marrow signal intensity is markedly abnormal with diffuse osseous metastatic disease seen throughout the thoracic spine, essentially involving all levels. Postradiation changes are seen within the T7, T8, and T9 vertebral bodies.  There is mild vertebral body height loss at the T6 level which appears chronic in nature.  Overall, there is approximately 20% of height loss at this level.  Otherwise, vertebral bodies are preserved.  At the T4-5 level, there is epidural tumor within the right lateral aspect of the thecal sac extending into the right T4-5 neural foramen. There is resultant moderate right neural foraminal stenosis without significant central canal stenosis.  Tumor is seen involving several right sided ribs.  Slightly inferiorly at the T5 level, there is abnormal convex contour of the T5 vertebral body with classic curtain deformity of the ventral epidural space, consistent with epidural tumor (series 8, image 21).  There is resultant moderate canal stenosis with slight displacement of the spinal cord dorsally at this level.  No cord signal changes are seen.  Epidural tumor at this level extends into the left T5 foramen and along the right lateral aspect of T5 anteriorly, and along the right posterior fifth rib posteriorly and laterally (series 8, image 21). There is tumor extension into the posterior elements at this level, involving the bilateral pedicles and T4-5 facet joints, right greater than left.  There is secondary moderate right foraminal stenosis at T5-6.  Prominent tumor present within the left posterior elements of T7 is visualized, with extension into the left posterolateral epidural space (series 8, image 26).  The spinal cord is displaced anteriorly and to the right with secondary severe central canal stenosis. There is no associated cord signal changes.  Small amount of epidural tumor with cord deformity is seen inferiorly at the level of T10 (series 8, image 42).  No significant  foraminal stenosis.  There is mild central canal narrowing.  There is moderate left neural foraminal stenosis of the T11-12 left neural foramen due to particular enlargement from osseous metastatic disease.  MRI LUMBAR SPINE  Findings: The vertebral bodies are normally aligned with preservation of the normal lumbar lordosis.  Diffusely abnormal marrow signal intensity with innumerable osseous metastasis are again seen throughout the lumbar spine as well.  At the level of L1, there is epidural tumor within the right lateral epidural space abutting the right aspect of the spinal cord and resulting in mild canal stenosis.  There is tumor extension into the right neural foramen at L1-2 with nerve compression. Metastatic enlargement of the bilateral pedicles of L1 also contributes foraminal narrowing.  Inferiorly at the level of L3 - 4, there is degenerative disc bulge with a superimposed right foraminal disc protrusion which results and moderate right foraminal stenosis.  At the level of L4, there is abnormal epidural tumor within the left ventral aspect of the thecal sac with extension into the left lateral recess and left neural foramen.  There is secondary severe listhesis of the left L4-5 neural foramen. There is moderate canal stenosis at this level.  At the level of L5, a large abnormal epidural soft tissue masses present within the left ventral thecal sac which measures 1.8 x 1.5 x 2.2 cm in this compresses the left lateral recess and results in severe canal and left neural foraminal stenosis at L4-5.  The described epidural tumor enhances with post contrast images.  Innumerable osseous metastases are also seen within the sacrum and bilateral iliac wings.  Bilateral pleural effusions are present, right greater than left.  IMPRESSION: 1. Widespread osseous metastases throughout the thoracic and lumbar spine.  2. 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   3.  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  No cord signal changes.  4.  Epidural tumor within the left posterolateral epidural space at T6-T7 with secondary severe central canal stenosis and dorsal displacement of the spinal cord.  No cord signal changes.  5.  Soft tissue mass within the left ventral epidural space at the L5 with secondary severe canal and left neural foraminal stenosis at L4-5.  6. Epidural tumor within the right lateral epidural space at L1 with secondary mild canal and right neural foraminal stenosis.  7.  Bilateral pleural effusions, right greater than left.  Critical Value/emergent results were called by telephone at the time of interpretation on 03/13/2013 at 11:21 pm to Dr. Susy Frizzle, who verbally acknowledged these results.   Original Report Authenticated By: Rise Mu, M.D.    Labs:  Basic Metabolic Panel:  Recent Labs Lab 04/01/13 0555 04/02/13 0900 04/03/13 0430 04/04/13 0545 04/05/13 0450  NA 135 133* 132* 133* 135  K 3.8 3.7 4.0 4.2 3.9  CL 102 100 100 101 102  CO2 24 24 27 26  23  GLUCOSE 186* 203* 150* 139* 184*  BUN 26* 26* 27* 33* 30*  CREATININE 0.59 0.50 0.50 0.57 0.56  CALCIUM 9.3 9.2 8.8 8.7 8.7   GFR Estimated Creatinine Clearance: 90 ml/min (by C-G formula based on Cr of 0.56). Liver Function Tests:  Recent Labs Lab 04/01/13 0555 04/02/13 0900 04/03/13 0430 04/04/13 0545 04/05/13 0450  AST 663* 60* 34 28 26  ALT 70* 31 40 43 44  ALKPHOS 332* 277* 234* 197* 186*  BILITOT 0.5 0.5 0.3 0.3 0.3  PROT 5.6* 5.8* 5.3* 5.1* 5.2*  ALBUMIN 2.5* 2.5* 2.2* 2.2* 2.3*    CBC:  Recent Labs Lab 04/01/13 0555 04/02/13 0900 04/03/13 0430 04/04/13 0545 04/05/13 0450  WBC 9.7 8.1 7.6 7.6 7.3  NEUTROABS 8.6* 7.5 6.7 6.6 6.6  HGB 9.3* 9.4* 8.6* 8.3* 8.9*  HCT 27.9* 28.5* 26.3* 25.6* 27.3*  MCV 80.2 80.3 80.7 80.8 81.0  PLT 128* 154 161 196 216    CBG:  Recent Labs Lab 04/04/13 1146 04/04/13 1524 04/05/13 0034 04/05/13 0806 04/05/13 1235  GLUCAP 202* 129* 137* 134* 229*   Microbiology Recent Results (from the past 240 hour(s))  TECHNOLOGIST REVIEW     Status: None   Collection Time    03/29/13  3:57 PM      Result Value Range Status   Technologist Review few shistocytes, few helmets, few teardrops   Final  SURGICAL PCR SCREEN     Status: None   Collection Time    03/30/13  4:39 PM      Result Value Range Status   MRSA, PCR NEGATIVE  NEGATIVE Final   Staphylococcus aureus NEGATIVE  NEGATIVE Final   Comment:            The Xpert SA Assay (FDA     approved for NASAL specimens     in patients over 33 years of age),     is one component of     a comprehensive surveillance     program.  Test performance has     been validated by [employer] for patients greater     than or equal to 24 year old.     It is not intended     to diagnose infection nor to     guide or monitor treatment.    Time coordinating discharge: 35 minutes.  Signed:  RAMA,CHRISTINA  Pager [phone] [facility] 04/05/2013, 12:57 PM

## 2013-04-05 NOTE — Progress Notes (Signed)
Report given to Morrie Sheldon, RN on inpatient rehab unit.

## 2013-04-05 NOTE — Progress Notes (Signed)
Patient admitted to the unit about 1600. Patient alert and oriented to the rehab unit/process. Safety plan reviewed with patient and butterfly wish filled out. Wife at bedside. Rehab orientation done. Patient given percocet 2 tabs at [facility] for back pain with some relief. Continue with plan of care.

## 2013-04-06 ENCOUNTER — Inpatient Hospital Stay (HOSPITAL_COMMUNITY): Payer: BC Managed Care – PPO | Admitting: Physical Therapy

## 2013-04-06 ENCOUNTER — Inpatient Hospital Stay (HOSPITAL_COMMUNITY): Payer: BC Managed Care – PPO | Admitting: Occupational Therapy

## 2013-04-06 ENCOUNTER — Inpatient Hospital Stay (HOSPITAL_COMMUNITY): Payer: BC Managed Care – PPO

## 2013-04-06 ENCOUNTER — Ambulatory Visit
Admit: 2013-04-06 | Discharge: 2013-04-06 | Disposition: A | Discharge status: Home / Self Care | Payer: BC Managed Care – PPO | Attending: Radiation Oncology | Admitting: Radiation Oncology

## 2013-04-06 DIAGNOSIS — C720 Malignant neoplasm of spinal cord: Secondary | ICD-10-CM

## 2013-04-06 DIAGNOSIS — C799 Secondary malignant neoplasm of unspecified site: Secondary | ICD-10-CM

## 2013-04-06 DIAGNOSIS — G8220 Paraplegia, unspecified: Secondary | ICD-10-CM

## 2013-04-06 DIAGNOSIS — C61 Malignant neoplasm of prostate: Secondary | ICD-10-CM

## 2013-04-06 LAB — CBC WITH DIFFERENTIAL/PLATELET
Basophils Absolute: 0 10*3/uL (ref 0.0–0.1)
Eosinophils Absolute: 0 10*3/uL (ref 0.0–0.7)
Eosinophils Relative: 0 % (ref 0–5)
Lymphs Abs: 0.2 10*3/uL — ABNORMAL LOW (ref 0.7–4.0)
MCH: 26.5 pg (ref 26.0–34.0)
Neutrophils Relative %: 92 % — ABNORMAL HIGH (ref 43–77)
Platelets: 224 10*3/uL (ref 150–400)
RBC: 3.06 MIL/uL — ABNORMAL LOW (ref 4.22–5.81)
RDW: 18.9 % — ABNORMAL HIGH (ref 11.5–15.5)
WBC: 6 10*3/uL (ref 4.0–10.5)

## 2013-04-06 LAB — GLUCOSE, CAPILLARY
Glucose-Capillary: 177 mg/dL — ABNORMAL HIGH (ref 70–99)
Glucose-Capillary: 162 mg/dL — ABNORMAL HIGH (ref 70–99)

## 2013-04-06 LAB — COMPREHENSIVE METABOLIC PANEL
ALT: 38 U/L (ref 0–53)
AST: 20 U/L (ref 0–37)
Albumin: 2.2 g/dL — ABNORMAL LOW (ref 3.5–5.2)
Alkaline Phosphatase: 142 U/L — ABNORMAL HIGH (ref 39–117)
Calcium: 8.6 mg/dL (ref 8.4–10.5)
Glucose, Bld: 148 mg/dL — ABNORMAL HIGH (ref 70–99)
Potassium: 4.4 mEq/L (ref 3.5–5.1)
Sodium: 133 mEq/L — ABNORMAL LOW (ref 135–145)
Total Protein: 4.9 g/dL — ABNORMAL LOW (ref 6.0–8.3)

## 2013-04-06 MED ORDER — FERROUS SULFATE 325 (65 FE) MG PO TABS
325.0000 mg | ORAL_TABLET | Freq: Two times a day (BID) | ORAL | Status: DC
  Administered 2013-04-06 – 2013-04-18 (×24): 325 mg via ORAL
  Filled 2013-04-06 (×27): qty 1

## 2013-04-06 NOTE — Evaluation (Signed)
Physical Therapy Assessment and Plan  Patient Details  Name: Andrew Nelson MRN: [id number] Date of Birth: 07-15-1951  PT Diagnosis: Abnormal posture, Abnormality of gait, Coordination disorder, Difficulty walking and Muscle weakness Rehab Potential: Good ELOS: 2.5-3   Today's Date: 04/06/2013 Time: 4098-1191 Time Calculation (min): 70 min  Problem List:  Patient Active Problem List   Diagnosis Date Noted  . Metastatic cancer 04/06/2013  . Protein-calorie malnutrition, moderate 04/04/2013  . Weakness of both legs 03/30/2013  . Anemia of chronic disease 03/30/2013  . Hyponatremia 03/30/2013  . 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 Hypertension   . Avascular necrosis of femur head, right 02/25/2012  . DM w/o Complication Type I 10/14/2007  . TOBACCO ABUSE 04/12/2007  . PROSTATE CANCER, HX OF 04/12/2007    Past Medical History:  Past Medical History  Diagnosis Date  . Diabetes mellitus   . Hypertension   . Prostate ca 6/07    prostate ca dx  . Allergy   . Anemia   . CAD (coronary artery disease)   . Blood transfusion without reported diagnosis     May 2013  . History of radiation therapy 09/19/12-10/10/2012    T-7-T9,35Gy/105fx,Prescaral mass 35Gy/5fx  . History of radiation therapy 05/2006-06/2006    salvage prostate rad tx 68.4Gy  . Bone cancer     prostate ca with mets to spine   Past Surgical History:  Past Surgical History  Procedure Laterality Date  . Prostatectomy  03/03/06    radical,gleason 4+5=9,Adenocarcinoma  . Colonoscopy w/ polypectomy  03/01/01    hyperplastic,no adenomatous change or malignancy  . Portacath placement      right  . Laminectomy N/A 03/14/2013    Procedure: Thoracic six-seven laminectomy, resection of tumor. Lumbar five laminectomy and resection of tumor;  Surgeon: Temple Pacini, MD;  Location: [facility];  Service: Neurosurgery;  Laterality: N/A;  . Laminectomy Bilateral 03/30/2013    Procedure: THORACIC LAMINECTOMY FOR  TUMOR;  Surgeon: Temple Pacini, MD;  Location: [facility];  Service: Neurosurgery;  Laterality: Bilateral;  Thoracic Four-Six Laminectomy for Tumor    Assessment & Plan Clinical Impression: Andrew Nelson is a 62 y.o. right-handed male with history of known metastatic prostate carcinoma status post previous T6-7 and L5 laminectomies for resection of epidural tumor 03/14/2013 and was discharged to home 03/16/2013. Patient was seen by his oncologist [name] 03/29/2013 with patient reporting acute onset of lower extremity weakness to the point he could not get out of bed. Patient's prior functional status was able to ambulate with minimal effort using a walker while at home. He was admitted 03/29/2013 for further evaluation. Emergent workup demonstrated evidence of marked progression of his epidural tumor at T4-5 level causing severe spinal cord cauda equina compression. He had progression of his dorsal L3 epidural tumor as well and the epidural tumor behind the body of L5 also appear to be progressing.. patient underwent T4-T5 laminectomy resection of epidural tumor 03/30/2013 per Dr. Jordan Likes. Postoperative pain management. Subcutaneous heparin for DVT prophylaxis. Radiation therapy initiated 04/03/2013 per [name] x30 sessions. Patient transferred to CIR on 04/05/2013 .   Patient currently requires mod to +2 assist with mobility secondary to muscle weakness, decreased cardiorespiratoy endurance, impaired timing and sequencing, unbalanced muscle activation and decreased coordination and decreased standing balance, decreased postural control, decreased balance strategies and difficulty maintaining precautions. Pt most limited by pain in upper back in upright sitting, unable to tolerate much time at all  in wheelchair, somewhat able to tolerate recliner. Very limited ability to ambulation (~1.5' at a time) with RW and wheelchair close to follow. Pt with lofty goals of long distance ambulation and  ability to utilize steps to enter and to access top floor at home. Prior to hospitalization, patient was independent  (prior to initial surgery 3 weeks ago, modified independent with RW prior to most recent weakness) with mobility and lived with Spouse in a House home.  Home access is 3Stairs to enter, pt will likely need a ramp but not receptive to this at the time.  Patient will benefit from skilled PT intervention to maximize safe functional mobility, minimize fall risk and decrease caregiver burden for planned discharge home with 24 hour supervision.  Anticipate patient will benefit from follow up HH at discharge.  PT - End of Session Endurance Deficit: Yes PT Assessment Rehab Potential: Good Barriers to Discharge: Inaccessible home environment PT Patient demonstrates impairments in the following area(s): Balance;Endurance;Motor;Pain;Sensory PT Transfers Functional Problem(s): Bed Mobility;Bed to Chair;Car;Furniture;Floor PT Locomotion Functional Problem(s): Ambulation;Wheelchair Mobility;Stairs PT Plan PT Intensity: Minimum of 1-2 x/day ,45 to 90 minutes PT Frequency: 5 out of 7 days PT Duration Estimated Length of Stay: 2.5-3 PT Treatment/Interventions: Ambulation/gait training;Balance/vestibular training;Discharge planning;Community reintegration;DME/adaptive equipment instruction;Neuromuscular re-education;Pain management;Functional mobility training;Patient/family education;Splinting/orthotics;Therapeutic Exercise;UE/LE Coordination activities;Therapeutic Activities;Psychosocial support;Stair training;UE/LE Strength taining/ROM;Wheelchair propulsion/positioning;Disease management/prevention PT Transfers Anticipated Outcome(s): Supervision PT Locomotion Anticipated Outcome(s): Supervision short distances PT Recommendation Follow Up Recommendations: Home health PT;24 hour supervision/assistance Patient destination: Home Equipment Recommended:  (to be determined) Equipment Details: Pt  will likely need a ramp  Skilled Therapeutic Intervention First session: 10/10 pain, RN called for medication.  Practiced second round of gait with RW and mod assist with second person for safety, pt has difficulty with proprioception of Lt. LE and tends to adduct LE. PT attempting to assist with progression of this LE, pt tends to verbalize resistance to assistance. Practiced scoot transfers to/from wheelchair and to drop arm recliner. Pt needs multiple rest breaks due to fatigue however is able to accomplish task with supervision and min cues for sequencing. Limited by pain.   Second Session Skilled Therapeutic Interventions/Progress Updates:  Time:  0945-1000 Time Calculation (min): 15 min Pain: 10/10 Pt requested 15 min rest break as he had just returned to bed. Spoke with RN who reports pt to go for radiation at 1100 which would result in half an OT evaluation. OT available to start evaluation at 10. PT session cut short so that OT would get a full evaluation. Will attempt to make up time at end of day if time allows.   Performed bed mobility with mod cues for back precautions. Pt reports most pain in upright sitting positions as gravity creates a lot of pressure in his upper back. Scoot transfer to wheelchair with overall min assist and assist for set-up. Seated marching and long arc quads 2 x 10 reps each with cues for core stabilization. Attempted scapular retraction activation again but pt unable. OT arrived for evaluation.   PT Evaluation Precautions/Restrictions Precautions Precautions: Fall;Back Precaution Comments: able to recall 2/3 back precautions Restrictions Weight Bearing Restrictions: No General   Vital SignsTherapy Vitals BP: 124/66 mmHg Pain  up to 10/10, called for pain medication Home Living/Prior Functioning Home Living Available Help at Discharge: Available 24 hours/day;Family Type of Home: House Home Access: Stairs to enter [employer] of Steps:  3 Entrance Stairs-Rails: Right Home Layout: Two level;Bed/bath upstairs Alternate Level Stairs-Number of Steps: 12 Alternate Level Stairs-Rails:  Can reach both  Lives With: Spouse Prior Function Level of Independence:  (see comments)  Able to Take Stairs?: Yes Driving: Yes (prior to first surgery) Vocation: Retired Leisure: Hobbies-yes (Comment) (Golf) Comments: Pt had first surgery 3 weeks ago and was using RW since with good mobility, then sudden LE last week and had to crawl/scoot down steps and into car.   Cognition Orientation Level: Oriented X4 Sensation Sensation Light Touch: Appears Intact (bil. LEs) Proprioception: Impaired by gross assessment (bil. LEs Lt>Rt.) Coordination Gross Motor Movements are Fluid and Coordinated: Yes  Mobility Bed Mobility Bed Mobility: Rolling Left;Left Sidelying to Sit Rolling Left: 4: Min assist;With rail Rolling Left Details (indicate cue type and reason): Cues for log roll technique Left Sidelying to Sit: HOB flat;4: Min assist Left Sidelying to Sit Details (indicate cue type and reason): Cues for maintaining precautions, increased time and rest breaks needed due to pain.  Sitting - Scoot to Edge of Bed: 4: Min guard Transfers Transfers: Yes Sit to Stand: From bed;With upper extremity assist;From chair/3-in-1;3: Mod assist Sit to Stand Details (indicate cue type and reason): Cues for pre-stand mechanics (scooting to edge of surface, placing feet under self) and cues for hand placement. Assist needed due to bil. LE weakness.  Stand to Sit: To chair/3-in-1;With upper extremity assist;To bed;With armrests;3: Mod assist Stand to Sit Details: Cues for UE placement, decreased ability to control descent Squat Pivot Transfers: 4: Min Designer, industrial/product Details (indicate cue type and reason): increased time due to pain, cues for safety Locomotion  Ambulation Ambulation: Yes Ambulation/Gait Assistance: 1: +2 Total assist;3: Mod  assist Ambulation Distance (Feet): 1.5 Feet (x 2) Assistive device: Rolling walker Ambulation/Gait Assistance Details: Pt only able to take a few steps then must sit down, wheelchair close to follow because of this. Pt with unsteady gait due to weak legs and narrow base of support. With first attempt pt unable to advance Lt. LE, with second attempt Lt. LE adducted greatly demonstrating impaired proprioception.  Gait Gait: Yes Gait Pattern: Impaired (difficult to evaluate due to short distance ) Gait Pattern: Step-to pattern;Shuffle;Narrow base of support;Trunk flexed;Decreased step length - left Stairs / Additional Locomotion Stairs: No (unsafe to attempt) Naval architect Mobility: Yes Wheelchair Propulsion: Both upper extremities Wheelchair Parts Management: Needs assistance Distance: 150'  Trunk/Postural Assessment  Cervical Assessment Cervical Assessment: Exceptions to [facility] Cervical AROM Overall Cervical AROM Comments: decreased rotation, flexed Thoracic Assessment Thoracic Assessment: Exceptions to [facility] Thoracic AROM Overall Thoracic AROM Comments: kyphotic Thoracic Strength Overall Thoracic Strength Comments: unable to perform active scapular retraction  Lumbar Assessment Lumbar Assessment: Exceptions to [facility] Lumbar Strength Overall Lumbar Strength Comments: generalized weakness s/p surgery Postural Control Postural Control: Within Functional Limits  Balance Balance Balance Assessed: Yes Static Sitting Balance Static Sitting - Balance Support: Right upper extremity supported Static Sitting - Level of Assistance: 5: Stand by assistance Static Sitting - Comment/# of Minutes: Needs atleast one UE supported due to pain and truncal weakness. Static Standing Balance Static Standing - Balance Support: Bilateral upper extremity supported Static Standing - Level of Assistance: 4: Min assist Static Standing - Comment/# of Minutes: only able to stand for ~1-2 min at a  time due to pain and weakness.  Extremity Assessment      RLE Assessment RLE Assessment: Exceptions to [facility] RLE Strength Right Hip Flexion: 3-/5 Right Knee Flexion: 3-/5 Right Knee Extension: 3/5 Right Ankle Dorsiflexion: 3/5 Right Ankle Plantar Flexion: 3/5 LLE Assessment LLE Assessment: Exceptions to [facility]  LLE Strength Left Hip Flexion: 3-/5 Left Knee Flexion: 2+/5 Left Knee Extension: 2+/5 Left Ankle Dorsiflexion: 3/5 Left Ankle Plantar Flexion: 3/5  FIM:  FIM - Bed/Chair Transfer Bed/Chair Transfer: 4: Supine > Sit: Min A (steadying Pt. > 75%/lift 1 leg);4: Bed > Chair or W/C: Min A (steadying Pt. > 75%);4: Chair or W/C > Bed: Min A (steadying Pt. > 75%) FIM - Locomotion: Wheelchair Distance: 150' Locomotion: Wheelchair: 4: Travels 150 ft or more: maneuvers on rugs and over door sillls with minimal assistance (Pt.>75%) FIM - Locomotion: Ambulation Ambulation/Gait Assistance: 1: +2 Total assist;3: Mod assist Locomotion: Ambulation: 1: Two helpers FIM - Locomotion: Stairs Locomotion: Stairs: 0: Activity did not occur (unsafe to attempt)   Refer to Care Plan for Long Term Goals  Recommendations for other services: None  Discharge Criteria: Patient will be discharged from PT if patient refuses treatment 3 consecutive times without medical reason, if treatment goals not met, if there is a change in medical status, if patient makes no progress towards goals or if patient is discharged from hospital.  The above assessment, treatment plan, treatment alternatives and goals were discussed and mutually agreed upon: by patient  Wilhemina Bonito 04/06/2013, 12:26 PM

## 2013-04-06 NOTE — Plan of Care (Signed)
Problem: SCI BLADDER ELIMINATION Goal: RH STG MANAGE BLADDER WITH ASSISTANCE STG Manage Bladder With min Assistance  Outcome: Not Progressing Incontinence at HS

## 2013-04-06 NOTE — Progress Notes (Signed)
Occupational Therapy Note  Patient Details  Name: Andrew Nelson MRN: [id number] Date of Birth: 1950-11-16 Today's Date: 04/06/2013  Time: 1330-1400 Pt c/o 6/10 pain in lower back; RN aware and repositioned Individual Therapy  Pt resting in bed upon arrival.  Pt stated he had just finished lunch after returning from radiation.  Pt stated he was exhausted and was unable to get comfortable in bed.  Assisted patient with repositioning.  Discussed LTGs and discharge planning.  Pt explained that his wife is not able to provide any physical assistance.  Pt explained that his bedroom and bathroom are on second floor of home.  Pt became tearful during session.  Emotional support provided.   Lavone Neri [facility] 04/06/2013, 2:45 PM

## 2013-04-06 NOTE — Progress Notes (Signed)
Patient information reviewed and entered into eRehab system by Deepika Decatur, RN, CRRN, PPS Coordinator.  Information including medical coding and functional independence measure will be reviewed and updated through discharge.    

## 2013-04-06 NOTE — Evaluation (Signed)
Occupational Therapy Assessment and Plan & Session Note  Patient Details  Name: Andrew Nelson MRN: [id number] Date of Birth: 08-17-1950  OT Diagnosis: acute pain and muscle weakness (generalized) Rehab Potential: Rehab Potential: Good ELOS: 2.5-3wks   Today's Date: 04/06/2013 Time: 0945-1000 Time calculation: 45 minute eval & ADL individual treatment session.  Problem List:  Patient Active Problem List   Diagnosis Date Noted  . Metastatic cancer 04/06/2013  . Protein-calorie malnutrition, moderate 04/04/2013  . Weakness of both legs 03/30/2013  . Anemia of chronic disease 03/30/2013  . Hyponatremia 03/30/2013  . 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 Hypertension   . Avascular necrosis of femur head, right 02/25/2012  . DM w/o Complication Type I 10/14/2007  . TOBACCO ABUSE 04/12/2007  . PROSTATE CANCER, HX OF 04/12/2007    Past Medical History:  Past Medical History  Diagnosis Date  . Diabetes mellitus   . Hypertension   . Prostate ca 6/07    prostate ca dx  . Allergy   . Anemia   . CAD (coronary artery disease)   . Blood transfusion without reported diagnosis     May 2013  . History of radiation therapy 09/19/12-10/10/2012    T-7-T9,35Gy/74fx,Prescaral mass 35Gy/70fx  . History of radiation therapy 05/2006-06/2006    salvage prostate rad tx 68.4Gy  . Bone cancer     prostate ca with mets to spine   Past Surgical History:  Past Surgical History  Procedure Laterality Date  . Prostatectomy  03/03/06    radical,gleason 4+5=9,Adenocarcinoma  . Colonoscopy w/ polypectomy  03/01/01    hyperplastic,no adenomatous change or malignancy  . Portacath placement      right  . Laminectomy N/A 03/14/2013    Procedure: Thoracic six-seven laminectomy, resection of tumor. Lumbar five laminectomy and resection of tumor;  Surgeon: Temple Pacini, MD;  Location: [facility];  Service: Neurosurgery;  Laterality: N/A;  . Laminectomy Bilateral 03/30/2013    Procedure:  THORACIC LAMINECTOMY FOR TUMOR;  Surgeon: Temple Pacini, MD;  Location: [facility];  Service: Neurosurgery;  Laterality: Bilateral;  Thoracic Four-Six Laminectomy for Tumor    Assessment & Plan Clinical Impression: Andrew Nelson is a 62 y.o. right-handed male with history of known metastatic prostate carcinoma status post previous T6-7 and L5 laminectomies for resection of epidural tumor 03/14/2013 and was discharged to home 03/16/2013. Patient was seen by his oncologist [name] 03/29/2013 with patient reporting acute onset of lower extremity weakness to the point he could not get out of bed. Patient's prior functional status was able to ambulate with minimal effort using a walker while at home. He was admitted 03/29/2013 for further evaluation. Emergent workup demonstrated evidence of marked progression of his epidural tumor at T4-5 level causing severe spinal cord cauda equina compression. He had progression of his dorsal L3 epidural tumor as well and the epidural tumor behind the body of L5 also appear to be progressing.. patient underwent T4-T5 laminectomy resection of epidural tumor 03/30/2013 per Dr. Jordan Likes. Postoperative pain management. Subcutaneous heparin for DVT prophylaxis. Radiation therapy initiated 04/03/2013 per [name] x30 sessions. Physical and occupational therapy evaluations completed. M.D.has requested physical medicine rehabilitation consult to consider inpatient rehabilitation services. After consultation, the patient was ultimately admitted to today for inpatient rehab. Patient transferred to CIR on 04/05/2013 .    Patient currently requires max with basic self-care skills secondary to muscle weakness, impaired timing and sequencing and decreased standing balance.  Prior to hospitalization (  one month prior), pt (I) in all areas.  Patient will benefit from skilled intervention to decrease level of assist with basic self-care skills and increase independence with basic  self-care skills prior to discharge home with care partner.  Anticipate patient will require 24 hour supervision and follow up home health.  OT - End of Session Activity Tolerance: Tolerates 10 - 20 min activity with multiple rests (due to back pain) Endurance Deficit: Yes OT Assessment Rehab Potential: Good Barriers to Discharge: Inaccessible home environment;Decreased caregiver support (wife w/recent back surgery) Barriers to Discharge Comments: 3 STE; wife w/recent back sx OT Patient demonstrates impairments in the following area(s): Balance;Pain;Motor;Endurance;Sensory;Skin Integrity OT Basic ADL's Functional Problem(s): Bathing;Dressing;Toileting OT Transfers Functional Problem(s): Toilet;Tub/Shower OT Additional Impairment(s): Fuctional Use of Upper Extremity (FMC B hands) OT Plan OT Intensity: Minimum of 1-2 x/day, 45 to 90 minutes OT Frequency: 5 out of 7 days OT Duration/Estimated Length of Stay: 2.5-3wks OT Treatment/Interventions: Balance/vestibular training;Functional mobility training;Neuromuscular re-education;Pain management;Patient/family education;Self Care/advanced ADL retraining;Therapeutic Activities;Therapeutic Exercise;UE/LE Coordination activities;UE/LE Strength taining/ROM;Wheelchair propulsion/positioning OT Self Feeding Anticipated Outcome(s): (I) OT Basic Self-Care Anticipated Outcome(s): (I) for UB; Mod (I) for LB from bed level; Sup for shower OT Toileting Anticipated Outcome(s): Mod (I) for hygiene/clothing; Close supervision for txfr OT Bathroom Transfers Anticipated Outcome(s): close supervision OT Recommendation Patient destination: Home (vs. SNF if wife unable to provide necessary care) Follow Up Recommendations: Home health OT Equipment Details: anticipate need for 3n1 commode (drop arm possibly) and either shower chair or TTB for bathing   OT Evaluation Precautions/Restrictions  Precautions Precautions: Fall;Back Precaution Comments: able to recall  2/3 back precautions Restrictions Weight Bearing Restrictions: No  Pain Pain Assessment Pain Score: 8   Home Living/Prior Functioning Home Living Family/patient expects to be discharged to:: Private residence Living Arrangements: Spouse/significant other Available Help at Discharge: Available 24 hours/day;Family Type of Home: House Home Access: Stairs to enter [employer] of Steps: 3 (in garage; 4-5 at front) Entrance Stairs-Rails: Right Home Layout: Two level;Bed/bath upstairs Alternate Level Stairs-Number of Steps: 12 Alternate Level Stairs-Rails: Can reach both Additional Comments: 1/2 bath on first floor; all bedrooms on 2nd  Lives With: Spouse IADL History Homemaking Responsibilities: No Occupation: Retired Advertising account planner: used to enjoy golfing (u/a past year) Prior Function Level of Independence:  (see comments)  Able to Take Stairs?: Yes Driving: Yes (prior to first surgery) Vocation: Retired Leisure: Hobbies-yes (Comment) (Golf) Comments: Pt had first surgery 3 weeks ago and was using RW since with good mobility, then sudden LE last week and had to crawl/scoot down steps and into car.   Vision/Perception  Vision - History Baseline Vision: No visual deficits   Cognition Overall Cognitive Status: Within Functional Limits for tasks assessed Arousal/Alertness: Awake/alert Orientation Level: Oriented X4 Attention: Focused Focused Attention: Appears intact Memory: Appears intact Awareness: Appears intact Safety/Judgment: Appears intact  Sensation Sensation Light Touch: Impaired Detail (pt c/o of numbness/tingling B hands) Proprioception: Impaired by gross assessment (bil. LEs Lt>Rt.) Coordination Gross Motor Movements are Fluid and Coordinated: Yes  Mobility  Bed Mobility Bed Mobility: Rolling Left;Left Sidelying to Sit Rolling Left: 4: Min assist;With rail Rolling Left Details (indicate cue type and reason): Cues for log roll  technique Left Sidelying to Sit: HOB flat;4: Min assist Left Sidelying to Sit Details (indicate cue type and reason): Cues for maintaining precautions, increased time and rest breaks needed due to pain.  Sitting - Scoot to Edge of Bed: 4: Min guard Transfers Sit to Stand: From bed;With upper extremity  assist;From chair/3-in-1;3: Mod assist Sit to Stand Details (indicate cue type and reason): Cues for pre-stand mechanics (scooting to edge of surface, placing feet under self) and cues for hand placement. Assist needed due to bil. LE weakness.  Stand to Sit: To chair/3-in-1;With upper extremity assist;To bed;With armrests;3: Mod assist Stand to Sit Details: Cues for UE placement, decreased ability to control descent   Trunk/Postural Assessment  Cervical Assessment Cervical Assessment: Exceptions to [facility] Cervical AROM Overall Cervical AROM Comments: decreased rotation, flexed Thoracic Assessment Thoracic Assessment: Exceptions to [facility] Thoracic AROM Overall Thoracic AROM Comments: kyphotic Thoracic Strength Overall Thoracic Strength Comments: unable to perform active scapular retraction  Lumbar Assessment Lumbar Assessment: Exceptions to [facility] Lumbar Strength Overall Lumbar Strength Comments: generalized weakness s/p surgery Postural Control Postural Control: Within Functional Limits   Balance Balance Balance Assessed: Yes Static Sitting Balance Static Sitting - Balance Support: Right upper extremity supported Static Sitting - Level of Assistance: 5: Stand by assistance Static Sitting - Comment/# of Minutes: Needs atleast one UE supported due to pain and truncal weakness. Static Standing Balance Static Standing - Balance Support: Bilateral upper extremity supported Static Standing - Level of Assistance: 4: Min assist Static Standing - Comment/# of Minutes: only able to stand for ~1-2 min at a time due to pain and weakness.   Extremity/Trunk Assessment RUE Assessment RUE Assessment:  Within Functional Limits LUE Assessment LUE Assessment: Within Functional Limits  FIM:  FIM - Grooming Grooming Steps: Wash, rinse, dry face;Wash, rinse, dry hands;Brush, comb hair;Oral care, brush teeth, clean dentures Grooming: 5: Set-up assist to open containers FIM - Bathing Bathing Steps Patient Completed: Chest;Right Arm;Left Arm;Abdomen;Front perineal area Bathing: 3: Mod-Patient completes 5-7 15f 10 parts or 50-74% FIM - Upper Body Dressing/Undressing Upper body dressing/undressing: 0: Wears gown/pajamas-no public clothing FIM - Lower Body Dressing/Undressing Lower body dressing/undressing: 0: Wears Oceanographer FIM - Toileting Toileting: 1: Two helpers FIM - Press photographer: 4: Supine > Sit: Min A (steadying Pt. > 75%/lift 1 leg);4: Bed > Chair or W/C: Min A (steadying Pt. > 75%);4: Chair or W/C > Bed: Min A (steadying Pt. > 75%) FIM - Diplomatic Services operational officer Devices: Psychiatrist Transfers: 1-Two helpers FIM - IT consultant Transfers: 0-Activity did not occur or was simulated   Refer to Care Plan for Long Term Goals  Recommendations for other services: None  Discharge Criteria: Patient will be discharged from OT if patient refuses treatment 3 consecutive times without medical reason, if treatment goals not met, if there is a change in medical status, if patient makes no progress towards goals or if patient is discharged from hospital.  The above assessment, treatment plan, treatment alternatives and goals were discussed and mutually agreed upon: by patient and by family  Session Note: OT eval completed per MD orders and ADL skilled intervention initiated. Pt in w/c w/wife present requesting assist to bathroom. Pt performed scoot pivot txfr w/2 person assist from w/c to commode w/min-mod +2. Once positioned, he was (I) to control bladder but unable to have BM after sitting for at least 5  minutes. He was dep to don clean pull-up briefs. He stood w/mod/max +2 using RW for support. SPT w/RW & mod/max +2 to w/c. LLE w/decreased motor movement needing physical assist and max cues to advance. Once in w/c and positioned at sink, pt able to complete grooming tasks w/set-up only. He needed assist to open containers 2/2 dec [facility] and reported sensory changes. Pt reports  numbness/tingling and reports hands "feeling clumsy." He was able to bathe above waist w/set-up only except for back. Pt back tender to touch. C/o of 10/10 pain and "heaviness." Pt w/ notable kyphotic posture. Dressing deferred as pt awaiting transport to radiation therapy and need for hospital gown. Pt requested back to bed. Pt Mod A scoot pivot to L back into bed and mod A for LE mgmt. Once supine, pt noted decrease in back pain. Pt states goals for (I) but due to LE weakness and 2nd floor bed/bath, pt will likely be at w/c level upon d/c needing 1st floor set-up. Goals set for (I)/Mod (I) for dressing from w/c and bed level, (I) w/c level grooming tasks and Close supervision for txfrs. Anticipate LOS 2.5-3 weeks. Pt and wife hopeful for pt to return home but she is limited in physical ability to assist 2/2 recent back surgery. Pt states his brothers and/or son & daughter can provide intermittent physical assist. Pt likely to need ramp access to home. OT necessary to maximize pt (I), safety & fx'l potential and provide necessary pt and family education.   Jessaca Philippi, Deidre Ala 04/06/2013, 1:13 PM

## 2013-04-06 NOTE — Progress Notes (Signed)
Spoke with Crist Infante, nurse assigned to patient today at [facility]. Explained patient would need to be at [facility] by 1130. She reports that she will hand this off to the social worker to arrange. Contacted Rex at [employer]. Rex is aware the patient needs to be at [facility] by 1130 and has the patient on the schedule for transport.

## 2013-04-06 NOTE — Progress Notes (Signed)
Subjective/Complaints: Did fairly well last night. Complains of pain in the right axilla.  A 12 point review of systems has been performed and if not noted above is otherwise negative.   Objective: Vital Signs: Blood pressure 111/58, pulse 56, temperature 98.2 F (36.8 C), temperature source Oral, resp. rate 20, height 5\' 7"  (1.702 m), weight 77.1 kg (169 lb 15.6 oz), SpO2 99.00%. No results found.  Recent Labs  04/05/13 2245 04/06/13 0627  WBC 7.1 6.0  HGB 8.5* 8.1*  HCT 25.0* 24.6*  PLT 225 224    Recent Labs  04/05/13 0450 04/05/13 2245 04/06/13 0627  NA 135  --  133*  K 3.9  --  4.4  CL 102  --  101  GLUCOSE 184*  --  148*  BUN 30*  --  24*  CREATININE 0.56 0.50 0.51  CALCIUM 8.7  --  8.6   CBG (last 3)   Recent Labs  04/05/13 1801 04/05/13 2041 04/06/13 0723  GLUCAP 147* 189* 140*    Wt Readings from Last 3 Encounters:  04/05/13 77.1 kg (169 lb 15.6 oz)  04/05/13 74.481 kg (164 lb 3.2 oz)  04/05/13 74.481 kg (164 lb 3.2 oz)    Physical Exam:    Constitutional: He is oriented to person, place, and time.  HENT: oral mucosa pink and moist  Head: Normocephalic.  Eyes: EOM are normal.  Neck: Normal range of motion. Neck supple. No thyromegaly present.  Cardiovascular: Normal rate and regular rhythm.  Pulmonary/Chest: Effort normal and breath sounds normal. No respiratory distress.  Abdominal: Soft. Bowel sounds are normal. He exhibits no distension.  Neurological: He is alert and oriented to person, place, and time.  Mid thoracic sensory level, below which he has 1+ sensation (senses pain stim but has difficulty discerning light touch and proprioception). UE strength 5/5. RLE: HF2-, KE 2 to 2+, ADF and APF 3 to 3+. dtr's tr to 1+ . LLE is gross is grossly 3 to 3+/5 proximal to distal  Musc: pain along right pec. No discrete pain along rib cage Skin:  Back incision is clean and dry  Psychiatric: He has a normal mood and affect. His behavior is normal.  Judgment and thought content normal   Assessment/Plan: 1. Functional deficits secondary to metastatic prostate cancer to the T-spine/L-spine s/p thoracic decompression which require 3+ hours per day of interdisciplinary therapy in a comprehensive inpatient rehab setting. Physiatrist is providing close team supervision and 24 hour management of active medical problems listed below. Physiatrist and rehab team continue to assess barriers to discharge/monitor patient progress toward functional and medical goals. FIM:       FIM - Toileting Toileting: 0: Activity did not occur           Comprehension Comprehension Mode: Auditory Comprehension: 7-Follows complex conversation/direction: With no assist  Expression Expression Mode: Verbal Expression: 7-Expresses complex ideas: With no assist  Social Interaction Social Interaction: 7-Interacts appropriately with others - No medications needed.  Problem Solving Problem Solving: 7-Solves complex problems: Recognizes & self-corrects  Memory Memory: 7-Complete Independence: No helper  Medical Problem List and Plan:  1. 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 DVT Prophylaxis/Anticoagulation: Subcutaneous heparin. Monitor platelet counts any signs of bleeding. Check vascular studies on admit.  3. Pain Management: Duragesic patch 75 mcg, hydrocodone and robaxin as needed. Monitor with increased mobility  -adjust regimen as needed for comfort and to allow participation with therapies   -axillary pain appears  to be muscular (?pec maj)--heat/stretching--monitor 4. Neuropsych: This patient is capable of making decisions on his own behalf.  5. Acute blood loss anemia. Fe supp. Serial hgb's. Recheck monday 6. Hypertension. Cardura 8 mg each bedtime, hydrochlorothiazide 25 mg daily, lisinopril 10 mg daily.  7. Diabetes mellitus with peripheral neuropathy. Hemoglobin A1c is 6.  Monitor closely while on Decadron therapy. NovoLog Mix 70/30 20 units daily. Fair control at present. SSI covg  8. Oncology. XRT is underway for adjunctive treatment of his L1,L3, and L5 lesions. XRT will be after therapies each day.  -decadron taper.  -follow up with rad onc regarding duration 9. FEN:  -encourage PO, BUN improving  LOS (Days) 1 A FACE TO FACE EVALUATION WAS PERFORMED  Leesa Leifheit T 04/06/2013 8:12 AM

## 2013-04-07 ENCOUNTER — Ambulatory Visit
Admit: 2013-04-07 | Discharge: 2013-04-07 | Disposition: A | Discharge status: Home / Self Care | Payer: BC Managed Care – PPO | Attending: Radiation Oncology | Admitting: Radiation Oncology

## 2013-04-07 ENCOUNTER — Inpatient Hospital Stay (HOSPITAL_COMMUNITY): Payer: BC Managed Care – PPO | Admitting: Physical Therapy

## 2013-04-07 ENCOUNTER — Encounter: Payer: BC Managed Care – PPO | Admitting: Radiation Oncology

## 2013-04-07 ENCOUNTER — Encounter (HOSPITAL_COMMUNITY): Payer: BC Managed Care – PPO

## 2013-04-07 ENCOUNTER — Encounter: Payer: Self-pay | Admitting: Radiation Oncology

## 2013-04-07 ENCOUNTER — Ambulatory Visit: Payer: BC Managed Care – PPO

## 2013-04-07 VITALS — BP 133/81 | HR 109 | Temp 97.9°F | Resp 18 | Wt 169.0 lb

## 2013-04-07 DIAGNOSIS — Z8546 Personal history of malignant neoplasm of prostate: Secondary | ICD-10-CM

## 2013-04-07 LAB — GLUCOSE, CAPILLARY

## 2013-04-07 MED ORDER — SODIUM CHLORIDE 0.9 % IJ SOLN
10.0000 mL | INTRAMUSCULAR | Status: DC | PRN
  Administered 2013-04-07 – 2013-04-18 (×16): 10 mL

## 2013-04-07 MED ORDER — SODIUM CHLORIDE 0.9 % IJ SOLN
10.0000 mL | Freq: Two times a day (BID) | INTRAMUSCULAR | Status: DC
  Administered 2013-04-14 – 2013-04-15 (×2): 10 mL

## 2013-04-07 NOTE — Progress Notes (Addendum)
Occupational Therapy Session Note  Patient Details  Name: Andrew Nelson MRN: [id number] Date of Birth: 1951/03/13  Today's Date: 04/07/2013 Time: 07:35-8:35 am  Time Calculation (min): 60 min  Short Term Goals: Week 1:  OT Short Term Goal 1 (Week 1): Pt to be at Min A level for scoot/SB txfr from w/c to drop arm commode OT Short Term Goal 2 (Week 1): Pt to perform LB dressing bed level w/min A OT Short Term Goal 3 (Week 1): Pt to be (I) after s/u for UB ADLs from w/c or EOB level OT Short Term Goal 4 (Week 1): Pt to propel w/c x100 ft w/Supervision and no more than 3 rest breaks  Skilled Therapeutic Interventions/Progress Updates:    Pt seen for individual OT treatment session with focus on ADL's for bathing/dressing at sink level & transfers, endurance, activity tolerance. Pt was in bed upon OT arrival and was overall min-mod assist for bed mobility and then transfers from EOB to w/c. Pt required frequent rest breaks and tends to want to do tasks his own way. He was educated verbally in a/e use & declined. OT educated pt that a/e in future session will be beneficial to which his souse was in agreement. Pt was Mod assist +1 sit<>stand at sink for peri care & dressing (wears depends, had urinated & was unaware) x2 w/ LOB x1 noted. Pt was max-total assist LE bathing and dressing. Pt fatigues easily due to impaired endurance/activity tolerance. At conclusion of session, pt was up in wheelchair getting ready to eat breakfast. Phone, call bell within reach & spouse present.  Therapy Documentation Precautions:  Precautions Precautions: Fall;Back Precaution Comments: able to recall 2/3 back precautions Restrictions Weight Bearing Restrictions: No   Pain: Pain Assessment Pain Score: 6/10 Pain Location: Back Pain Onset: On-going Pain Intervention(s): Pt reports that he was premedicated; repositioned in bed & then w/c        See FIM for current functional status  Therapy/Group: Individual  Therapy  Alm Bustard 04/07/2013, 12:13 PM

## 2013-04-07 NOTE — IPOC Note (Signed)
Overall Plan of Care [facility]) Patient Details Name: Andrew Nelson MRN: [id number] DOB: 08/27/1950  Admitting Diagnosis: THORACIC DECOMPRESSION  Hospital Problems: Principal Problem:   Metastatic cancer   Co-morbidities: pain, neurogenic bowel/bladder   Functional Problem List: Nursing Pain;Bladder;Bowel  PT Balance;Endurance;Motor;Pain;Sensory  OT Balance;Pain;Motor;Endurance;Sensory;Skin Integrity  SLP    TR         Basic ADL's: OT Bathing;Dressing;Toileting     Advanced  ADL's: OT       Transfers: PT Bed Mobility;Bed to Chair;Car;Furniture;Floor  OT Toilet;Tub/Shower     Locomotion: PT Ambulation;Wheelchair Mobility;Stairs     Additional Impairments: OT Fuctional Use of Upper Extremity (FMC B hands)  SLP        TR      Anticipated Outcomes Item Anticipated Outcome  Self Feeding (I)  Swallowing      Basic self-care  (I) for UB; Mod (I) for LB from bed level; Sup for shower  Toileting  Mod (I) for hygiene/clothing; Close supervision for txfr   Bathroom Transfers close supervision  Bowel/Bladder  min assist   Transfers  Supervision  Locomotion  Supervision short distances  Communication     Cognition     Pain  less than 3 out of 10 on scale  Safety/Judgment  mod I    Therapy Plan: PT Intensity: Minimum of 1-2 x/day ,45 to 90 minutes PT Frequency: 5 out of 7 days PT Duration Estimated Length of Stay: 2.5-3 OT Intensity: Minimum of 1-2 x/day, 45 to 90 minutes OT Frequency: 5 out of 7 days OT Duration/Estimated Length of Stay: 2.5-3wks         Team Interventions: Nursing Interventions Patient/Family Education;Bowel Management;Bladder Management;Pain Management;Medication Management;Skin Care/Wound Management;Discharge Planning  PT interventions Ambulation/gait training;Balance/vestibular training;Discharge planning;Community reintegration;DME/adaptive equipment instruction;Neuromuscular re-education;Pain management;Functional mobility  training;Patient/family education;Splinting/orthotics;Therapeutic Exercise;UE/LE Coordination activities;Therapeutic Activities;Psychosocial support;Stair training;UE/LE Strength taining/ROM;Wheelchair propulsion/positioning;Disease management/prevention  OT Interventions Balance/vestibular training;Functional mobility training;Neuromuscular re-education;Pain management;Patient/family education;Self Care/advanced ADL retraining;Therapeutic Activities;Therapeutic Exercise;UE/LE Coordination activities;UE/LE Strength taining/ROM;Wheelchair propulsion/positioning  SLP Interventions    TR Interventions    SW/CM Interventions      Team Discharge Planning: Destination: PT-Home ,OT- Home (vs. SNF if wife unable to provide necessary care) , SLP-  Projected Follow-up: PT-Home health PT;24 hour supervision/assistance, OT-  Home health OT, SLP-  Projected Equipment Needs: PT- (to be determined), OT-  , SLP-  Patient/family involved in discharge planning: PT- Patient,  OT-Patient;Family member/caregiver, SLP-   MD ELOS: 2-3 weeks Medical Rehab Prognosis:  Excellent Assessment: The patient has been admitted for CIR therapies. The team will be addressing, functional mobility, strength, stamina, balance, safety, adaptive techniques/equipment, self-care, bowel and bladder mgt, patient and caregiver education, SCI education, pain mgt. Goals have been set at supervision to modified independent with basic mobility.    Ranelle Oyster, MD, FAAPMR      See Team Conference Notes for weekly updates to the plan of care

## 2013-04-07 NOTE — Progress Notes (Signed)
Reports taking decadron 4 mg every six hours. Reports ability to control bowels and bladder. Reports that he had a bowel movement last night. Denies nausea or vomiting. Reports low back pain 7 on a scale of 0-10 but, confirms he is now able to get comfortable. Reports during radiation therapy he feels a hot sharp shooting pain that radiates down his right leg. Denies headache or dizziness. Reports weakness continues.

## 2013-04-07 NOTE — Progress Notes (Signed)
Subjective/Complaints: Had a good night. Had a busy day yesterday. Has pain early in the am A 12 point review of systems has been performed and if not noted above is otherwise negative.   Objective: Vital Signs: Blood pressure 126/78, pulse 69, temperature 97.6 F (36.4 C), temperature source Oral, resp. rate 18, height 5\' 7"  (1.702 m), weight 77.1 kg (169 lb 15.6 oz), SpO2 99.00%. No results found.  Recent Labs  04/05/13 2245 04/06/13 0627  WBC 7.1 6.0  HGB 8.5* 8.1*  HCT 25.0* 24.6*  PLT 225 224    Recent Labs  04/05/13 0450 04/05/13 2245 04/06/13 0627  NA 135  --  133*  K 3.9  --  4.4  CL 102  --  101  GLUCOSE 184*  --  148*  BUN 30*  --  24*  CREATININE 0.56 0.50 0.51  CALCIUM 8.7  --  8.6   CBG (last 3)   Recent Labs  04/06/13 1642 04/06/13 2212 04/07/13 0729  GLUCAP 162* 179* 149*    Wt Readings from Last 3 Encounters:  04/05/13 77.1 kg (169 lb 15.6 oz)  04/05/13 74.481 kg (164 lb 3.2 oz)  04/05/13 74.481 kg (164 lb 3.2 oz)    Physical Exam:    Constitutional: He is oriented to person, place, and time.  HENT: oral mucosa pink and moist  Head: Normocephalic.  Eyes: EOM are normal.  Neck: Normal range of motion. Neck supple. No thyromegaly present.  Cardiovascular: Normal rate and regular rhythm.  Pulmonary/Chest: Effort normal and breath sounds normal. No respiratory distress.  Abdominal: Soft. Bowel sounds are normal. He exhibits no distension.  Neurological: He is alert and oriented to person, place, and time.  Mid thoracic sensory level, below which he has 1+ sensation (senses pain stim but has difficulty discerning light touch and proprioception). UE strength 5/5. RLE: HF2-, KE 2 to 2+, ADF and APF 3 to 3+. dtr's tr to 1+ . LLE is gross is grossly 3 to 3+/5 proximal to distal  Musc: pain along right pec. No discrete pain along rib cage Skin:  Back incision is clean and dry  Psychiatric: He has a normal mood and affect. His behavior is normal.  Judgment and thought content normal   Assessment/Plan: 1. Functional deficits secondary to metastatic prostate cancer to the T-spine/L-spine s/p thoracic decompression which require 3+ hours per day of interdisciplinary therapy in a comprehensive inpatient rehab setting. Physiatrist is providing close team supervision and 24 hour management of active medical problems listed below. Physiatrist and rehab team continue to assess barriers to discharge/monitor patient progress toward functional and medical goals. FIM: FIM - Bathing Bathing Steps Patient Completed: Chest;Right Arm;Left Arm;Abdomen;Front perineal area Bathing: 3: Mod-Patient completes 5-7 69f 10 parts or 50-74%  FIM - Upper Body Dressing/Undressing Upper body dressing/undressing: 0: Wears gown/pajamas-no public clothing FIM - Lower Body Dressing/Undressing Lower body dressing/undressing: 0: Wears Oceanographer  FIM - Toileting Toileting: 1: Two helpers  FIM - Diplomatic Services operational officer Devices: Psychiatrist Transfers: 1-Two helpers  FIM - Games developer Transfer: 4: Supine > Sit: Min A (steadying Pt. > 75%/lift 1 leg);4: Bed > Chair or W/C: Min A (steadying Pt. > 75%);4: Chair or W/C > Bed: Min A (steadying Pt. > 75%)  FIM - Locomotion: Wheelchair Distance: 150' Locomotion: Wheelchair: 4: Travels 150 ft or more: maneuvers on rugs and over door sillls with minimal assistance (Pt.>75%) FIM - Locomotion: Ambulation Ambulation/Gait Assistance: 1: +2 Total assist;3: Mod  assist Locomotion: Ambulation: 1: Two helpers  Comprehension Comprehension Mode: Auditory Comprehension: 7-Follows complex conversation/direction: With no assist  Expression Expression Mode: Verbal Expression: 7-Expresses complex ideas: With no assist  Social Interaction Social Interaction: 5-Interacts appropriately 90% of the time - Needs monitoring or encouragement for participation or  interaction.  Problem Solving Problem Solving: 5-Solves complex 90% of the time/cues < 10% of the time  Memory Memory: 7-Complete Independence: No helper  Medical Problem List and Plan:  1. 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 DVT Prophylaxis/Anticoagulation: Subcutaneous heparin. Monitor platelet counts any signs of bleeding. Check vascular studies on admit.  3. Pain Management: Duragesic patch 75 mcg, hydrocodone and robaxin as needed. Monitor with increased mobility  -adjust regimen as needed for comfort and to allow participation with therapies   -axillary pain appears to be muscular (?pec maj)--heat/stretching--monitor  -consider scheduling of AM short acting agents 4. Neuropsych: This patient is capable of making decisions on his own behalf.  5. Acute blood loss anemia. Fe supp. Serial hgb's. Recheck monday 6. Hypertension. Cardura 8 mg each bedtime, hydrochlorothiazide 25 mg daily, lisinopril 10 mg daily.  7. Diabetes mellitus with peripheral neuropathy. Hemoglobin A1c is 6. Monitor closely while on Decadron therapy. NovoLog Mix 70/30 20 units daily. Fair control at present. SSI covg  8. Oncology. XRT is underway for adjunctive treatment of his L1,L3, and L5 lesions. XRT will be after therapies each day.  -decadron taper.  -follow up with rad onc regarding duration (10-14 treatments) 9. FEN:  -encourage PO, BUN improving  LOS (Days) 2 A FACE TO FACE EVALUATION WAS PERFORMED  Braydee Shimkus T 04/07/2013 8:34 AM

## 2013-04-07 NOTE — Progress Notes (Signed)
Physical Therapy Note  Patient Details  Name: Andrew Nelson MRN: [id number] Date of Birth: 14-Apr-1951 Today's Date: 04/07/2013  1445-1525 (40 minutes) individual Pain: 6/10 low back with movement/ Premedicated Focus of treatment: transfer training Treatment: Pt in bed upon arrival; supine to side to sit with bedrail min assist with increased time; sit to stand min assist ; transfers bed >< wc X 2 with RW min assist with difficulty placing LT LE  And vcs to stand within AD during transfers ; instability RT knee in stance.    Andrew Nelson,Andrew Nelson 04/07/2013, 3:24 PM

## 2013-04-07 NOTE — Plan of Care (Signed)
Problem: SCI BOWEL ELIMINATION Goal: RH STG MANAGE BOWEL WITH ASSISTANCE STG Manage Bowel with Assistance. Mod I  Outcome: Progressing No incontinent episode reported     

## 2013-04-07 NOTE — Plan of Care (Signed)
Problem: RH SKIN INTEGRITY Goal: RH STG SKIN FREE OF INFECTION/BREAKDOWN Skin free of infection/breakdown with minimal assistance  Outcome: Progressing Surgical incision healing appropriately.

## 2013-04-07 NOTE — Progress Notes (Addendum)
Physical Therapy Note  Patient Details  Name: RAINN BULLINGER MRN: [id number] Date of Birth: Jul 18, 1951 Today's Date: 04/07/2013  1030-1100 (30 minutes) individual Pain: 7/10 low back (increased in sitting)/ premedicated Focus of treatment: Bilateral quad strengthening to decrease assist sit to stand Treatment : supine to side to sit (mat) SBA with increased time; sit to stand from mat to RW focusing on timing of bilateral knee extension and eccentric control stand to sit X 5 ; attempted stand/turn transfer with RW - pt unable to move LT LE ; scoot transfer wc > bed min assist ; sit to side to supine mod assist (bilatral LEs) to bed (decreased c/o pain in supine).    Eliel Dudding,JIM 04/07/2013, 10:59 AM

## 2013-04-07 NOTE — Progress Notes (Signed)
[facility]patient [facility] Individual Statement of Services  Patient Name:  Andrew Nelson  Date:  04/07/2013  Welcome to the [facility].  Our goal is to provide you with an individualized program based on your diagnosis and situation, designed to meet your specific needs.  With this comprehensive rehabilitation program, you will be expected to participate in at least 3 hours of rehabilitation therapies Monday-Friday, with modified therapy programming on the weekends.  Your rehabilitation program will include the following services:  Physical Therapy (PT), Occupational Therapy (OT), 24 hour per day rehabilitation nursing, Therapeutic Recreaction (TR), Neuropsychology, Case Management (Social Worker), Rehabilitation Medicine, Nutrition Services and Pharmacy Services  Weekly team conferences will be held on Tuesdays to discuss your progress.  Your Social Worker will talk with you frequently to get your input and to update you on team discussions.  Team conferences with you and your family in attendance may also be held.  Expected length of stay: 2-3 weeks  Overall anticipated outcome: supervision to modified independent  Depending on your progress and recovery, your program may change. Your Social Worker will coordinate services and will keep you informed of any changes. Your Social Worker's name and contact numbers are listed  below.  The following services may also be recommended but are not provided by the [facility]:   Driving Evaluations  Home Health Rehabiltiation Services  Outpatient Rehabilitation Services   Arrangements will be made to provide these services after discharge if needed.  Arrangements include referral to agencies that provide these services.  Your insurance has been verified to be:  BCBS PPO Your primary doctor is:  Dr. Kelle Darting  Pertinent information will be shared with your doctor and your insurance  company.  Social Worker:  [place], [place] [phone] or ([phone]   Information discussed with and copy given to patient by: Amada Jupiter, 04/07/2013, 4:26 PM

## 2013-04-07 NOTE — Plan of Care (Signed)
Problem: SCI BLADDER ELIMINATION Goal: RH STG MANAGE BLADDER WITH ASSISTANCE STG Manage Bladder With Assistance. Mod I  Outcome: Progressing Uses urinal. Requires staff to remove and empty.

## 2013-04-07 NOTE — Progress Notes (Signed)
Physical Therapy Session Note  Patient Details  Name: Andrew Nelson MRN: [id number] Date of Birth: 10-25-1950  Today's Date: 04/07/2013 Time: 0905-1000 Time Calculation (min): 55 min   Skilled Therapeutic Interventions/Progress Updates:    Pain most limiting factor during session. Wheelchair propulsion to gym for strengthening and endurance, supervision. Stand pivot min progressing to sudden max assist as Lt. LE very ataxic and with certain steps adducts and goes into extension. Pt unable to [employer]. LE to allow therapist to abduct/flex hip to place in a safe position. Pt has to be safely lowered to mat to prevent fall. With standing balance activity, bil. UEs supported pt attempted to do forward/back step with Lt. LE pre-gait however pt again significantly ataxic with Lt. LE and had to be seated after Lt. LE adducted and extended throwing pt strongly to the Lt.   Seated scapular retraction/depression for strengthening and endurance of thoracic musculature. Pt has to return to supine on mat multiple bouts during session due to pain.   Therapy Documentation Precautions:  Precautions Precautions: Fall;Back Precaution Comments: able to recall 2/3 back precautions Restrictions Weight Bearing Restrictions: No Pain: Pain Assessment Pain Score: 9  Pain Type: Surgical pain Pain Location: Back Pain Orientation: Upper Pain Descriptors / Indicators: Aching;Burning Pain Frequency: Constant Pain Onset: On-going Patients Stated Pain Goal: 4 Pain Intervention(s): RN made aware  See FIM for current functional status  Therapy/Group: Individual Therapy  Wilhemina Bonito 04/07/2013, 10:11 AM

## 2013-04-07 NOTE — Progress Notes (Signed)
  Radiation Oncology         ([phone]) [phone] ________________________________  Name: Andrew Nelson MRN: [id number]  Date: 04/07/2013  DOB: 1950/08/14  Weekly Radiation Therapy Management  Current Dose: 15 Gy     Planned Dose:  30 Gy  Narrative . . . . . . . . The patient presents for routine under treatment assessment.                                                      The patient is without complaint.  He is working with rehab to regain strength, and has been able to bear weight.                                 Set-up films were reviewed.                                 The chart was checked. Physical Findings. . .  weight is 169 lb (76.658 kg). His oral temperature is 97.9 F (36.6 C). His blood pressure is 133/81 and his pulse is 109. His respiration is 18. . Weight essentially stable.  No significant changes. Impression . . . . . . . The patient is  tolerating radiation. Plan . . . . . . . . . . . . Continue treatment as planned.  ________________________________  Artist Pais. Kathrynn Running, M.D.

## 2013-04-07 NOTE — Plan of Care (Signed)
Problem: RH PAIN MANAGEMENT Goal: RH STG PAIN MANAGED AT OR BELOW PT'S PAIN GOAL Less than 4 out of 10  Outcome: Not Progressing Rating pain as 8

## 2013-04-08 ENCOUNTER — Inpatient Hospital Stay (HOSPITAL_COMMUNITY): Payer: BC Managed Care – PPO | Admitting: Physical Therapy

## 2013-04-08 ENCOUNTER — Inpatient Hospital Stay (HOSPITAL_COMMUNITY): Payer: BC Managed Care – PPO

## 2013-04-08 DIAGNOSIS — D638 Anemia in other chronic diseases classified elsewhere: Secondary | ICD-10-CM

## 2013-04-08 DIAGNOSIS — C801 Malignant (primary) neoplasm, unspecified: Secondary | ICD-10-CM

## 2013-04-08 DIAGNOSIS — I10 Essential (primary) hypertension: Secondary | ICD-10-CM

## 2013-04-08 DIAGNOSIS — E871 Hypo-osmolality and hyponatremia: Secondary | ICD-10-CM

## 2013-04-08 DIAGNOSIS — E109 Type 1 diabetes mellitus without complications: Secondary | ICD-10-CM

## 2013-04-08 LAB — GLUCOSE, CAPILLARY
Glucose-Capillary: 155 mg/dL — ABNORMAL HIGH (ref 70–99)
Glucose-Capillary: 174 mg/dL — ABNORMAL HIGH (ref 70–99)
Glucose-Capillary: 174 mg/dL — ABNORMAL HIGH (ref 70–99)
Glucose-Capillary: 151 mg/dL — ABNORMAL HIGH (ref 70–99)

## 2013-04-08 NOTE — Progress Notes (Signed)
Physical Therapy Session Note  Patient Details  Name: Andrew Nelson MRN: [id number] Date of Birth: 10-28-1950  Today's Date: 04/08/2013 Time: 1130-1200 Time Calculation (min): 30 min  Therapy Documentation Precautions:  Precautions Precautions: Fall;Back Precaution Comments: able to recall 2/3 back precautions Restrictions Weight Bearing Restrictions: No Vital Signs: Therapy Vitals BP: 142/61 mmHg Pain: Pain Assessment Pain Assessment: 0-10 Pain Score:  (same,just done with therapy) Pain Type: Acute pain;Surgical pain Pain Location: Back (right axillary) Pain Orientation: Right;Medial Pain Descriptors / Indicators: Dull;Aching;Sharp Pain Onset: On-going Pain Intervention(s): Medication (See eMAR)  Therapeutic Activity:(15') Bed mobility supine into L side lying with S/Mod-Independent with rail. Transfer L side lying into sitting EOB with Max-Assist. Bed<->w/c with Mod-Assist Therapeutic Exercise:(15')  Manually resisted B LE's in sitting to include hip abd/add   Therapy/Group: Individual Therapy  Rex Kras 04/08/2013, 11:39 AM

## 2013-04-08 NOTE — Progress Notes (Signed)
Occupational Therapy Session Note  Patient Details  Name: Andrew Nelson MRN: [id number] Date of Birth: 30-Oct-1950  Today's Date: 04/08/2013 Time: 9528-4132 Time Calculation (min): 46 min  Short Term Goals: Week 1:  OT Short Term Goal 1 (Week 1): Pt to be at Min A level for scoot/SB txfr from w/c to drop arm commode OT Short Term Goal 2 (Week 1): Pt to perform LB dressing bed level w/min A OT Short Term Goal 3 (Week 1): Pt to be (I) after s/u for UB ADLs from w/c or EOB level OT Short Term Goal 4 (Week 1): Pt to propel w/c x100 ft w/Supervision and no more than 3 rest breaks  Skilled Therapeutic Interventions/Progress Updates: ADL-retraining with emphasis on pain management, bed>w/c and w/c<>toilet transfers, modified bathing at sink side, adherence to back precautions and introduction to use of AE for lower body bathing.   Patient able rise from supine>sit @ EOB with assist to manage hospital bed and given extra time to rest and manage pain.  Pt completed lateral scoot transfer from EOB to w/c with assist to stabilize w/c.   Patient completed upper body bathing at sink and attempted lower body bathing using LH sponge but terminated bath d/t need to toilet.  Patient able to direct caregiver with placement of w/c and shows good insight relating to his physical limitations.   Patient completed toileting and attempted stand-pivot transfer back to w/c but had some difficulty managing his left leg during transfers, although completing transfer with only minor lowering assist.  Therapy Documentation Precautions:  Precautions Precautions: Fall;Back Precaution Comments: able to recall 2/3 back precautions Restrictions Weight Bearing Restrictions: No  Pain: Pain Assessment Pain Assessment: 0-10 Pain Score:  (same) Pain Type: Acute pain;Surgical pain Pain Location: Back Pain Orientation: Right;Medial Pain Descriptors / Indicators: Aching;Dull;Sharp Pain Onset: On-going Pain Intervention(s):  Medication (See eMAR)  See FIM for current functional status  Therapy/Group: Individual Therapy  Second session: Time:1336-1436 Time Calculation (min):  60 min  Pain Assessment: 7/10, back/shoulder, provided massage and distraction  Skilled Therapeutic Interventions: ADL-retraining with emphasis on bed mobility, pain management, dynamic sitting balance, transfers to right side (bed<>w/c), sit-stand, and toileting using urinal in bed.   Patient completed clothing management and use of urinal with verbal cues to improve strategy and mod assist to re-position patient toward head of bed.   Patient required frequent rest breaks and reported discomfort at right shoulder, posterior, suspected spasm of teres minor.  Following bed mobility, voiding with use of urinal, and transfers patient reported moderate pain from spasm and accepted vibrating massage treatment to reduce irritation.   Patient then completed 1 sit-stand at w/c and required assist with lifting legs back into bed during transfer from w/c > bed.  Patient demo'd persistent efforts at problem-solving during use of urinal and offered modification to improve his ability to void independently by changing clothing from elastic band to front snap closures.  See FIM for current functional status  Therapy/Group: Individual Therapy  Georgeanne Nim 04/08/2013, 12:37 PM

## 2013-04-08 NOTE — Progress Notes (Signed)
Physical Therapy Note  Patient Details  Name: KAWHI DIEBOLD MRN: [id number] Date of Birth: [birth year] Today's Date: 04/08/2013  1525-1620 (55 minutes) individual Pain: 8/10 RT shoulder/ meds given Focus of treatment: bilateral LE strengthening; transfer training Treatment: Pt requested meds before therapy session; supine to sit with bedrail SBA with increased time; scoot transfer bed > wc (level) close SBA ; sit to stand min assist to RW; transfer with RW min assist with decreased step length on left; therapeutic exercise- bilateral LE in supine X 10 - heel slides, hip abduction, SAQs; supine to sit (mat) - SBA with transfer to wc ( 10 minutes) min assist .    Arleny Kruger,JIM 04/08/2013, 3:48 PM

## 2013-04-08 NOTE — Plan of Care (Signed)
Problem: RH PAIN MANAGEMENT Goal: RH STG PAIN MANAGED AT OR BELOW PT'S PAIN GOAL Less than 4 out of 10  Outcome: Not Progressing Pain level 6/10 on a pain scale

## 2013-04-08 NOTE — Progress Notes (Signed)
Andrew Nelson is a 62 y.o. male [birth year] [id number]  Subjective: No new complaints. No new problems. No CP. Eating breakfast Slept well. Feeling OK.  Objective: Vital signs in last 24 hours: Temp:  [97.9 F (36.6 C)-98.1 F (36.7 C)] 97.9 F (36.6 C) (08/30 0601) Pulse Rate:  [60-109] 60 (08/30 0601) Resp:  [17-18] 18 (08/30 0601) BP: (118-142)/(60-81) 142/61 mmHg (08/30 0815) SpO2:  [99 %] 99 % (08/30 0601) Weight:  [169 lb (76.658 kg)] 169 lb (76.658 kg) (08/29 1239) Weight change:  Last BM Date: 04/06/13  Intake/Output from previous day: 08/29 0701 - 08/30 0700 In: 720 [P.O.:720] Out: 1700 [Urine:1700] Last cbgs: CBG (last 3)   Recent Labs  04/07/13 1652 04/07/13 2207 04/08/13 0753  GLUCAP 165* 160* 155*     Physical Exam General: No apparent distress    HEENT: moist mucosa Lungs: Normal effort. Lungs clear to auscultation, no crackles or wheezes. Cardiovascular: Regular rate and rhythm, no edema Musculoskeletal:  No change from before Neurological: No new neurological deficits Wounds: N/A    Skin: clear Alert, cooperative Abd s/nt  Lab Results: BMET    Component Value Date/Time   NA 133* 04/06/2013 0627   NA 138 02/28/2013 1216   NA 141 08/13/2011 1129   K 4.4 04/06/2013 0627   K 3.7 02/28/2013 1216   K 3.7 08/13/2011 1129   CL 101 04/06/2013 0627   CL 100 01/17/2013 1102   CL 103 08/13/2011 1129   CO2 25 04/06/2013 0627   CO2 26 02/28/2013 1216   CO2 28 08/13/2011 1129   GLUCOSE 148* 04/06/2013 0627   GLUCOSE 122 02/28/2013 1216   GLUCOSE 135* 01/17/2013 1102   GLUCOSE 161* 08/13/2011 1129   BUN 24* 04/06/2013 0627   BUN 11.4 02/28/2013 1216   BUN 15 08/13/2011 1129   CREATININE 0.51 04/06/2013 0627   CREATININE 0.7 02/28/2013 1216   CREATININE 0.5* 08/13/2011 1129   CALCIUM 8.6 04/06/2013 0627   CALCIUM 10.5* 02/28/2013 1216   CALCIUM 9.3 08/13/2011 1129   GFRNONAA >90 04/06/2013 0627   GFRAA >90 04/06/2013 0627   CBC    Component Value Date/Time   WBC 6.0  04/06/2013 0627   WBC 14.0* 03/29/2013 1557   RBC 3.06* 04/06/2013 0627   RBC 4.68 03/29/2013 1557   HGB 8.1* 04/06/2013 0627   HGB 12.3* 03/29/2013 1557   HCT 24.6* 04/06/2013 0627   HCT 38.2* 03/29/2013 1557   PLT 224 04/06/2013 0627   PLT Clumped Platelets--Appears Adequate 03/29/2013 1557   MCV 80.4 04/06/2013 0627   MCV 81.6 03/29/2013 1557   MCH 26.5 04/06/2013 0627   MCH 26.3* 03/29/2013 1557   MCHC 32.9 04/06/2013 0627   MCHC 32.2 03/29/2013 1557   RDW 18.9* 04/06/2013 0627   RDW 18.6* 03/29/2013 1557   LYMPHSABS 0.2* 04/06/2013 0627   LYMPHSABS 0.6* 03/29/2013 1557   MONOABS 0.3 04/06/2013 0627   MONOABS 0.9 03/29/2013 1557   EOSABS 0.0 04/06/2013 0627   EOSABS 0.0 03/29/2013 1557   BASOSABS 0.0 04/06/2013 0627   BASOSABS 0.0 03/29/2013 1557    Studies/Results: No results found.  Medications: I have reviewed the patient's current medications.  Assessment/Plan:  1. 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 DVT Prophylaxis/Anticoagulation: Subcutaneous heparin. Monitor platelet counts any signs of bleeding. Check vascular studies on admit.  3. Pain Management: Duragesic patch 75 mcg, hydrocodone and robaxin as needed. Monitor with increased mobility  -adjust regimen as needed  for comfort and to allow participation with therapies  -axillary pain appears to be muscular (?pec maj)--heat/stretching--monitor  -consider scheduling of AM short acting agents  4. Neuropsych: This patient is capable of making decisions on his own behalf.  5. Acute blood loss anemia. Fe supp. Serial hgb's. Recheck monday  6. Hypertension. Cardura 8 mg each bedtime, hydrochlorothiazide 25 mg daily, lisinopril 10 mg daily.  7. Diabetes mellitus with peripheral neuropathy. Hemoglobin A1c is 6. Monitor closely while on Decadron therapy. NovoLog Mix 70/30 20 units daily. Fair control at present. SSI covg  8. Oncology. XRT is underway for adjunctive  treatment of his L1,L3, and L5 lesions. XRT will be after therapies each day.  -decadron taper.  -follow up with rad onc regarding duration (10-14 treatments)  9. FEN:  -encourage PO, BUN improving  Cont Rx     Length of stay, days: 3  Sonda Primes , MD 04/08/2013, 11:06 AM

## 2013-04-08 NOTE — Plan of Care (Signed)
Problem: RH PAIN MANAGEMENT Goal: RH STG PAIN MANAGED AT OR BELOW PT'S PAIN GOAL Less than 4 out of 10  Outcome: Not Progressing With PRN pain meds pain still only managed to 7/8 out of 10.

## 2013-04-09 ENCOUNTER — Inpatient Hospital Stay (HOSPITAL_COMMUNITY): Payer: BC Managed Care – PPO | Admitting: Physical Therapy

## 2013-04-09 LAB — GLUCOSE, CAPILLARY
Glucose-Capillary: 143 mg/dL — ABNORMAL HIGH (ref 70–99)
Glucose-Capillary: 196 mg/dL — ABNORMAL HIGH (ref 70–99)

## 2013-04-09 NOTE — Progress Notes (Signed)
Social Work  Social Work Assessment and Plan  Patient Details  Name: Andrew Nelson MRN: [id number] Date of Birth: 19-Jul-1951  Today's Date: 04/09/2013  Problem List:  Patient Active Problem List   Diagnosis Date Noted  . Metastatic cancer 04/06/2013  . Protein-calorie malnutrition, moderate 04/04/2013  . Weakness of both legs 03/30/2013  . Anemia of chronic disease 03/30/2013  . Hyponatremia 03/30/2013  . 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 Hypertension   . Avascular necrosis of femur head, right 02/25/2012  . DM w/o Complication Type I 10/14/2007  . TOBACCO ABUSE 04/12/2007  . PROSTATE CANCER, HX OF 04/12/2007   Past Medical History:  Past Medical History  Diagnosis Date  . Diabetes mellitus   . Hypertension   . Prostate ca 6/07    prostate ca dx  . Allergy   . Anemia   . CAD (coronary artery disease)   . Blood transfusion without reported diagnosis     May 2013  . History of radiation therapy 09/19/12-10/10/2012    T-7-T9,35Gy/33fx,Prescaral mass 35Gy/65fx  . History of radiation therapy 05/2006-06/2006    salvage prostate rad tx 68.4Gy  . Bone cancer     prostate ca with mets to spine   Past Surgical History:  Past Surgical History  Procedure Laterality Date  . Prostatectomy  03/03/06    radical,gleason 4+5=9,Adenocarcinoma  . Colonoscopy w/ polypectomy  03/01/01    hyperplastic,no adenomatous change or malignancy  . Portacath placement      right  . Laminectomy N/A 03/14/2013    Procedure: Thoracic six-seven laminectomy, resection of tumor. Lumbar five laminectomy and resection of tumor;  Surgeon: Temple Pacini, MD;  Location: [facility];  Service: Neurosurgery;  Laterality: N/A;  . Laminectomy Bilateral 03/30/2013    Procedure: THORACIC LAMINECTOMY FOR TUMOR;  Surgeon: Temple Pacini, MD;  Location: [facility];  Service: Neurosurgery;  Laterality: Bilateral;  Thoracic Four-Six Laminectomy for Tumor   Social History:  reports that he has quit  smoking. He has never used smokeless tobacco. He reports that he does not drink alcohol or use illicit drugs.  Family / Support Systems Marital Status: Married How Long?: 43 yrs Patient Roles: Spouse;Parent Spouse/Significant Other: wife, Kyley Laurel @ (H) [phone] or (C) [phone] Children: two adult children both living locally:  son, Datrell, [place] and daughter, Juanda Chance Anticipated Caregiver: Chyrl Civatte - wife Ability/Limitations of Caregiver: Wife does not work and can Artist: 24/7 Family Dynamics: pt notes very good family support - believes his children will assist as their schedules will allow  Social History Preferred language: English Religion: [employer] Cultural Background: NA Education: collge Read: Yes Write: Yes Employment Status: Retired Date Retired/Disabled/Unemployed: 2009 Fish farm manager Issues: none Guardian/Conservator: none   Abuse/Neglect Physical Abuse: Denies Verbal Abuse: Denies Sexual Abuse: Denies Exploitation of patient/patient's resources: Denies Self-Neglect: Denies  Emotional Status Pt's affect, behavior adn adjustment status: Pt very direct with his answers.  Denies any significant emotional distress in r/t return of CA.  He notes no s/s of depression or anxiety.  Explains that he plans to do all treatments needed and "move on"... Recent Psychosocial Issues: None Pyschiatric History: None Substance Abuse History: NOne  Patient / Family Perceptions, Expectations & Goals Pt/Family understanding of illness & functional limitations: Pt with good understanding of his metastatic dz/ return on CA and need for surgical intervention.  Good understanding of treatment moving forward as well. Good understanding of functional limitations and need for CIR. Premorbid  pt/family roles/activities: independent overall but limited community level activity.  Wife managing most home duties. Anticipated changes in  roles/activities/participation: Wife may need to provide increase in physical assistance initially.  She has been providing primary caregiver assistance already. Pt/family expectations/goals: "I just want to be able to do as much for myself as I can"  [employer]: Other (Comment) (Cancer Center) Premorbid Home Care/DME Agencies: None Transportation available at discharge: yes Resource referrals recommended: Support group (specify)  Discharge Planning Living Arrangements: Spouse/significant other Support Systems: Spouse/significant other;Children Type of Residence: Private residence Insurance Resources: Media planner (specify) (BCBS PPO) Financial Resources: Social Security Financial Screen Referred: No Living Expenses: Own Money Management: Patient Does the patient have any problems obtaining your medications?: No Home Management: wife primarily Patient/Family Preliminary Plans: Pt plans to return home with wife as primary support Social Work Anticipated Follow Up Needs: HH/OP;Support Group Expected length of stay: 2-3 weeks  Clinical Impression Pleasant, oriented gentleman here after return of met CA.  Good family support.  Good understanding of his diagnosis/ prognosis.  Will follow for support and d/c planning needs.  Melody Cirrincione 04/09/2013, 2:06 PM

## 2013-04-09 NOTE — Progress Notes (Signed)
Andrew Nelson is a 62 y.o. male [birth year] [id number]  Subjective: No new complaints, except for being tired this am. No CP. Eating breakfast Slept well. Feeling OK.  Objective: Vital signs in last 24 hours: Temp:  [97.6 F (36.4 C)] 97.6 F (36.4 C) (08/31 0536) Pulse Rate:  [70-90] 90 (08/31 0814) Resp:  [19-20] 19 (08/31 0536) BP: (113-133)/(61-77) 132/77 mmHg (08/31 0814) SpO2:  [100 %] 100 % (08/31 0536) Weight change:  Last BM Date: 04/06/13  Intake/Output from previous day: 08/30 0701 - 08/31 0700 In: 460 [P.O.:460] Out: 250 [Urine:250] Last cbgs: CBG (last 3)   Recent Labs  04/08/13 1700 04/08/13 2132 04/09/13 0745  GLUCAP 174* 151* 143*     Physical Exam General: No apparent distress    HEENT: moist mucosa Lungs: Normal effort. Lungs clear to auscultation, no crackles or wheezes. Cardiovascular: Regular rate and rhythm, no edema Musculoskeletal:  No change from before Neurological: No new neurological deficits Wounds: N/A    Skin: clear Alert, cooperative Abd s/nt  Lab Results: BMET    Component Value Date/Time   NA 133* 04/06/2013 0627   NA 138 02/28/2013 1216   NA 141 08/13/2011 1129   K 4.4 04/06/2013 0627   K 3.7 02/28/2013 1216   K 3.7 08/13/2011 1129   CL 101 04/06/2013 0627   CL 100 01/17/2013 1102   CL 103 08/13/2011 1129   CO2 25 04/06/2013 0627   CO2 26 02/28/2013 1216   CO2 28 08/13/2011 1129   GLUCOSE 148* 04/06/2013 0627   GLUCOSE 122 02/28/2013 1216   GLUCOSE 135* 01/17/2013 1102   GLUCOSE 161* 08/13/2011 1129   BUN 24* 04/06/2013 0627   BUN 11.4 02/28/2013 1216   BUN 15 08/13/2011 1129   CREATININE 0.51 04/06/2013 0627   CREATININE 0.7 02/28/2013 1216   CREATININE 0.5* 08/13/2011 1129   CALCIUM 8.6 04/06/2013 0627   CALCIUM 10.5* 02/28/2013 1216   CALCIUM 9.3 08/13/2011 1129   GFRNONAA >90 04/06/2013 0627   GFRAA >90 04/06/2013 0627   CBC    Component Value Date/Time   WBC 6.0 04/06/2013 0627   WBC 14.0* 03/29/2013 1557   RBC 3.06* 04/06/2013  0627   RBC 4.68 03/29/2013 1557   HGB 8.1* 04/06/2013 0627   HGB 12.3* 03/29/2013 1557   HCT 24.6* 04/06/2013 0627   HCT 38.2* 03/29/2013 1557   PLT 224 04/06/2013 0627   PLT Clumped Platelets--Appears Adequate 03/29/2013 1557   MCV 80.4 04/06/2013 0627   MCV 81.6 03/29/2013 1557   MCH 26.5 04/06/2013 0627   MCH 26.3* 03/29/2013 1557   MCHC 32.9 04/06/2013 0627   MCHC 32.2 03/29/2013 1557   RDW 18.9* 04/06/2013 0627   RDW 18.6* 03/29/2013 1557   LYMPHSABS 0.2* 04/06/2013 0627   LYMPHSABS 0.6* 03/29/2013 1557   MONOABS 0.3 04/06/2013 0627   MONOABS 0.9 03/29/2013 1557   EOSABS 0.0 04/06/2013 0627   EOSABS 0.0 03/29/2013 1557   BASOSABS 0.0 04/06/2013 0627   BASOSABS 0.0 03/29/2013 1557    Studies/Results: No results found.  Medications: I have reviewed the patient's current medications.  Assessment/Plan:  1. 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 DVT Prophylaxis/Anticoagulation: Subcutaneous heparin. Monitor platelet counts any signs of bleeding. Check vascular studies on admit.  3. Pain Management: Duragesic patch 75 mcg, hydrocodone and robaxin as needed. Monitor with increased mobility  -adjust regimen as needed for comfort and to allow participation with therapies  -axillary pain appears  to be muscular (?pec maj)--heat/stretching--monitor  -consider scheduling of AM short acting agents  4. Neuropsych: This patient is capable of making decisions on his own behalf.  5. Acute blood loss anemia. Fe supp. Serial hgb's. Recheck monday  6. Hypertension. Cardura 8 mg each bedtime, hydrochlorothiazide 25 mg daily, lisinopril 10 mg daily.  7. Diabetes mellitus with peripheral neuropathy. Hemoglobin A1c is 6. Monitor closely while on Decadron therapy. NovoLog Mix 70/30 20 units daily. Fair control at present. SSI covg  8. Oncology. XRT is underway for adjunctive treatment of his L1,L3, and L5 lesions. XRT will be after therapies each  day.  -decadron taper.  -follow up with rad onc regarding duration (10-14 treatments)  9. FEN:  -encourage PO, BUN improving  Cont Rx     Length of stay, days: 4  Sonda Primes , MD 04/09/2013, 8:46 AM

## 2013-04-09 NOTE — Progress Notes (Signed)
Andrew Nelson continues to report pain down right shoulder and arm. Andrew Nelson reports some numbness and tingling in hand and reports it feels more weak. Andrew Nelson describes pain as dull and achy, sometimes sharp. Dr Posey Rea notified of this. No new orders received. Andrew Nelson Getting 2 percocet tablets every 4 hours. Pain remains at 7-9 out of 10. Andrew Nelson's vitals stable. Continue to monitor

## 2013-04-09 NOTE — Progress Notes (Signed)
Physical Therapy Note  Patient Details  Name: Andrew Nelson MRN: [id number] Date of Birth: Dec 06, 1950 Today's Date: 04/09/2013  1330-1410 (40 minutes) individual Pain: 8/10 RT shoulder/ meds given  Focus of treatment: Therapeutic activities focused on transfer training/ activity tolerance Treatment: Pt requested pain meds just when therapist entered room / nurse notified/ meds given; sit to supine SBA using bedrails; transfer scoot - SBA + setup; transfer WC >< Nustep with RW min assist for safety; Nustep Level 1 bilateral LE/ LT UE- pt unable to tolerate secondary to pain RT shoulder; transfer wc to bed scoot to squat/pivot (unlevel) SBA; sit to side to supine max assist; bed alarm activated.    [name],JIM 04/09/2013, 2:09 PM

## 2013-04-10 ENCOUNTER — Inpatient Hospital Stay (HOSPITAL_COMMUNITY): Payer: BC Managed Care – PPO | Admitting: Occupational Therapy

## 2013-04-10 ENCOUNTER — Inpatient Hospital Stay (HOSPITAL_COMMUNITY): Payer: BC Managed Care – PPO | Admitting: *Deleted

## 2013-04-10 ENCOUNTER — Inpatient Hospital Stay (HOSPITAL_COMMUNITY): Payer: BC Managed Care – PPO | Admitting: Physical Therapy

## 2013-04-10 LAB — CBC
MCH: 26.9 pg (ref 26.0–34.0)
MCHC: 33.5 g/dL (ref 30.0–36.0)
MCV: 80.3 fL (ref 78.0–100.0)
Platelets: 179 10*3/uL (ref 150–400)
RDW: 19.9 % — ABNORMAL HIGH (ref 11.5–15.5)

## 2013-04-10 LAB — GLUCOSE, CAPILLARY
Glucose-Capillary: 152 mg/dL — ABNORMAL HIGH (ref 70–99)
Glucose-Capillary: 127 mg/dL — ABNORMAL HIGH (ref 70–99)

## 2013-04-10 MED ORDER — DEXAMETHASONE 4 MG PO TABS
4.0000 mg | ORAL_TABLET | Freq: Four times a day (QID) | ORAL | Status: DC
  Administered 2013-04-10 – 2013-04-11 (×4): 4 mg via ORAL
  Filled 2013-04-10 (×8): qty 1

## 2013-04-10 MED ORDER — DEXAMETHASONE SODIUM PHOSPHATE 4 MG/ML IJ SOLN
6.0000 mg | Freq: Four times a day (QID) | INTRAMUSCULAR | Status: DC
  Filled 2013-04-10 (×8): qty 1.5

## 2013-04-10 MED ORDER — GABAPENTIN 300 MG PO CAPS
300.0000 mg | ORAL_CAPSULE | Freq: Three times a day (TID) | ORAL | Status: DC
  Administered 2013-04-10 – 2013-04-13 (×12): 300 mg via ORAL
  Filled 2013-04-10 (×16): qty 1

## 2013-04-10 NOTE — Progress Notes (Signed)
Physical Therapy Session Note  Patient Details  Name: Andrew Nelson MRN: [id number] Date of Birth: 30-Aug-1950  Today's Date: 04/10/2013 Time: 1515-1600 Time Calculation (min): 45 min   Skilled Therapeutic Interventions/Progress Updates:    Patient received supine in bed with c/o 8/10 R arm, shoulder, and hand pain. RN notified and administer pain medications. Session focused on functional transfers and L LE NMR and coordination. Patient supine>sit with bed rails and supervision, bed>wheelchair via lateral/scoot transfer with close SBA. Sitting in wheelchair, donned sheet as R arm sling to provide support and patient reports pain relief. Sitting in wheelchair, patient performed rolling with L LE on basketball: forward/backwards, side to side, CW circles and CCW circles to improve proprioception and coordination. Patient then progressed to doing the same activity with tennis ball. Patient demonstrates increased difficulty with L LE motor control and coordination with these movements using smaller ball. Patient then performed again with basketball and demonstrates improved coordination and motor control as compared to first trial.  Patient returned to room and performed scoot/lateral transfer wheelchair>bed (slight incline) with close SBA. Patient requires modA for sit>supine. Patient repositioned in bed and left supine in bed with all needs within reach.  Therapy Documentation Precautions:  Precautions Precautions: Fall;Back Precaution Comments: able to recall 2/3 back precautions Restrictions Weight Bearing Restrictions: No Pain: Pain Assessment Pain Assessment: 0-10 Pain Score: 8  Pain Location: Arm Pain Orientation: Right Pain Descriptors / Indicators: Sore;Sharp;Shooting;Aching Pain Intervention(s): Repositioned;Ambulation/increased activity Multiple Pain Sites: No Locomotion : Ambulation Ambulation/Gait Assistance: Not tested (comment)   See FIM for current functional  status  Therapy/Group: Individual Therapy  Chipper Herb. Rockland Kotarski, PT, DPT 04/10/2013, 4:07 PM

## 2013-04-10 NOTE — Progress Notes (Signed)
Occupational Therapy Session Notes  Patient Details  Name: Andrew Nelson MRN: [id number] Date of Birth: 09/06/1950  Today's Date: 04/10/2013 Time: 1005-1105 and 145-215 Time Calculation (min): 60 min and 30 min  Short Term Goals: Week 1:  OT Short Term Goal 1 (Week 1): Pt to be at Min A level for scoot/SB txfr from w/c to drop arm commode OT Short Term Goal 2 (Week 1): Pt to perform LB dressing bed level w/min A OT Short Term Goal 3 (Week 1): Pt to be (I) after s/u for UB ADLs from w/c or EOB level OT Short Term Goal 4 (Week 1): Pt to propel w/c x100 ft w/Supervision and no more than 3 rest breaks  Skilled Therapeutic Interventions/Progress Updates:  1)  Patient resting in bed with wife at his side upon arrival.  Patient stating that he requested back to bed after PT this am secondary to pain/discomfort yet willing to get OOB for scheduled bath and dress session.  Patient provided with option to perform self care EOB or up in w/c at sink due to right sided pain.  Patient chose in w/c at sink with focus on adhering to back precautions during all functional mobility and self care, bed mobility, scoot transfer, sit><stand and standing tolerance/balance.  Patient in obvious pain yet insistent on continuing with session and required numerous rest breaks resulting in unable to complete dressing in timeframe allotted therefore nursing to complete.  Near end of session, RN provided pain medication.  Patient's wife observed session.  2)  Patient resting in bed upon arrival and reluctant to get out of bed due to pain.  Patient agreed to perform self ROM exercises of his right shoulder and AROM exercises distal to shoulder.  Issued "super soft" Theraputty and patient very disturbed that he was unable to open plastic container or to squeeze the putty.  He reports 3 days ago he was feeding himself and opening packages with his dominant RUE.  Also issued red foam to be used on utensils to build them up in hopes  that patient will be able to use his RUE to feed self again.  Therapy Documentation Precautions:  Precautions Precautions: Fall;Back Precaution Comments: able to recall 2/3 back precautions Restrictions Weight Bearing Restrictions: No Pain: 1)  8/10 right shoulder down his side as well as down his arm, medication provided, rest, repositioned.  2)  Describes same pain in pm session, not rated, rest and repositioned, patient reports premedicated. ADL: See FIM for current functional status  Therapy/Group: Individual Therapy both sessions  Asif Muchow 04/10/2013, 12:04 PM

## 2013-04-10 NOTE — Progress Notes (Signed)
Subjective/Complaints: Worsening pain in right axilla down the inner half of the right arm. Feels his hand is weaker A 12 point review of systems has been performed and if not noted above is otherwise negative.   Objective: Vital Signs: Blood pressure 118/68, pulse 66, temperature 98.1 F (36.7 C), temperature source Oral, resp. rate 18, height 5\' 7"  (1.702 m), weight 77.1 kg (169 lb 15.6 oz), SpO2 97.00%. No results found.  Recent Labs  04/10/13 0545  WBC 3.2*  HGB 7.9*  HCT 23.6*  PLT 179   No results found for this basename: NA, K, CL, CO, GLUCOSE, BUN, CREATININE, CALCIUM,  in the last 72 hours CBG (last 3)   Recent Labs  04/09/13 1715 04/09/13 2116 04/10/13 0720  GLUCAP 196* 159* 152*    Wt Readings from Last 3 Encounters:  04/05/13 77.1 kg (169 lb 15.6 oz)  04/07/13 76.658 kg (169 lb)  04/05/13 74.481 kg (164 lb 3.2 oz)    Physical Exam:    Constitutional: He is oriented to person, place, and time.  HENT: oral mucosa pink and moist  Head: Normocephalic.  Eyes: EOM are normal.  Neck: Normal range of motion. Neck supple. No thyromegaly present.  Cardiovascular: Normal rate and regular rhythm.  Pulmonary/Chest: Effort normal and breath sounds normal. No respiratory distress.  Abdominal: Soft. Bowel sounds are normal. He exhibits no distension.  Neurological: He is alert and oriented to person, place, and time.  Mid thoracic sensory level, below which he has 1+ sensation (senses pain stim but has difficulty discerning light touch and proprioception). UE strength 5/5 except for right HI which are 4/5. RLE: HF2-, KE 2 to 2+, ADF and APF 3 to 3+. dtr's tr to 1+ . LLE is gross is grossly 3 to 3+/5 proximal to distal  Musc: pain along right pec. No discrete pain along rib cage Skin:  Back incision is clean and dry  Psychiatric: He has a normal mood and affect. His behavior is normal. Judgment and thought content normal   Assessment/Plan: 1. Functional deficits  secondary to metastatic prostate cancer to the T-spine/L-spine s/p thoracic decompression which require 3+ hours per day of interdisciplinary therapy in a comprehensive inpatient rehab setting. Physiatrist is providing close team supervision and 24 hour management of active medical problems listed below. Physiatrist and rehab team continue to assess barriers to discharge/monitor patient progress toward functional and medical goals. FIM: FIM - Bathing Bathing Steps Patient Completed: Chest;Right Arm;Left Arm;Abdomen;Front perineal area;Right upper leg;Left upper leg Bathing: 5: Set-up assist to: Obtain items  FIM - Upper Body Dressing/Undressing Upper body dressing/undressing steps patient completed: Thread/unthread right sleeve of pullover shirt/dresss;Thread/unthread left sleeve of pullover shirt/dress;Put head through opening of pull over shirt/dress;Pull shirt over trunk Upper body dressing/undressing: 6: More than reasonable amount of time FIM - Lower Body Dressing/Undressing Lower body dressing/undressing steps patient completed: Thread/unthread right pants leg;Thread/unthread left pants leg;Pull pants up/down;Don/Doff right shoe;Don/Doff left shoe;Fasten/unfasten right shoe;Fasten/unfasten left shoe Lower body dressing/undressing: 1: Total-Patient completed less than 25% of tasks  FIM - Toileting Toileting: 1: Total-Patient completed zero steps, helper did all 3  FIM - Diplomatic Services operational officer Devices: Grab bars Toilet Transfers: 4-To toilet/BSC: Min A (steadying Pt. > 75%);4-From toilet/BSC: Min A (steadying Pt. > 75%)  FIM - Bed/Chair Transfer Bed/Chair Transfer Assistive Devices: Arm rests Bed/Chair Transfer: 4: Bed > Chair or W/C: Min A (steadying Pt. > 75%);4: Chair or W/C > Bed: Min A (steadying Pt. > 75%)  FIM - Locomotion:  Wheelchair Distance: 150' Locomotion: Wheelchair: 5: Travels 150 ft or more: maneuvers on rugs and over door sills with supervision,  cueing or coaxing FIM - Locomotion: Ambulation Ambulation/Gait Assistance: 1: +2 Total assist;3: Mod assist Locomotion: Ambulation: 1: Two helpers  Comprehension Comprehension Mode: Auditory Comprehension: 7-Follows complex conversation/direction: With no assist  Expression Expression Mode: Verbal Expression: 7-Expresses complex ideas: With no assist  Social Interaction Social Interaction: 6-Interacts appropriately with others with medication or extra time (anti-anxiety, antidepressant).  Problem Solving Problem Solving: 5-Solves complex 90% of the time/cues < 10% of the time  Memory Memory: 5-Recognizes or recalls 90% of the time/requires cueing < 10% of the time  Medical Problem List and Plan:  1. 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 DVT Prophylaxis/Anticoagulation: Subcutaneous heparin. Monitor platelet counts any signs of bleeding. Check vascular studies on admit.  3. Pain Management: Duragesic patch 75 mcg, hydrocodone and robaxin as needed. Monitor with increased mobility  -adjust regimen as needed for comfort and to allow participation with therapies   -pain appears to be radicular, in right T1,T2 distribution with associated weakness in these areas. May have some C8 involvement also. MRI of the T-spine was notable for the following: Otherwise epidural/ extraosseous tumor in the thoracic spine is most  pronounced at the right T1 neural foramen/costovertebral junction,  the posterior T1 epidural space (series 8, image 3), right T2 neural  foramen, and left T11 pedicle/neural foramen. He also has diffuse mets to ribs which are playing a factor Compression -  -will increase decadron to 6mg   -add gabapentin 300mg  tid  -discuss with rad-onc/oncology   4. Neuropsych: This patient is capable of making decisions on his own behalf.  5. Acute blood loss anemia. Fe supp. Serial hgb's. Recheck monday 6.  Hypertension. Cardura 8 mg each bedtime, hydrochlorothiazide 25 mg daily, lisinopril 10 mg daily.  7. Diabetes mellitus with peripheral neuropathy. Hemoglobin A1c is 6. Monitor closely while on Decadron therapy. NovoLog Mix 70/30 20 units daily. Fair control at present. SSI covg  8. Oncology. XRT is underway for adjunctive treatment of his L1,L3, and L5 lesions. XRT will be after therapies each day.  -decadron continue -follow up with rad onc regarding treatment of upper thoracic area 9. FEN:  -encourage PO, BUN improving  LOS (Days) 5 A FACE TO FACE EVALUATION WAS PERFORMED  Fumi Guadron T 04/10/2013 9:22 AM

## 2013-04-10 NOTE — Progress Notes (Signed)
Physical Therapy Session Note  Patient Details  Name: Andrew Nelson MRN: [id number] Date of Birth: May 16, 1951  Today's Date: 04/10/2013 Time: 0830-0930 Time Calculation (min): 60 min   Skilled Therapeutic Interventions/Progress Updates:    Reports increased pain and weakness through Rt. UE, feels he can't grip as well with Rt. MD aware. Bed mobility supine>sit  supervision with use of rail, mod assist sit > supine pt needs assist with bil. LEs and moans with pain when returning to bed. Stand pivot transfer with RW and min assist however transfer difficult for pt, PT to block LT. LE from excessive extension, pt needs assist to negotiate RW. Ambulation 4 x 10' with RW and min assist, noted decreased proprioception of Lt. LE but much improved from last week. Pt needs assist with RW with turning. Pt moves very slow and requires frequent and extended rest breaks secondary to fatigue and pain.   Discussed learning sliding board transfers for "weaker days," pt agrees he needs a "plan B" but would like to see the transfer before he agrees to learn it.   Therapy Documentation Precautions:  Precautions Precautions: Fall;Back Precaution Comments: able to recall 2/3 back precautions Restrictions Weight Bearing Restrictions: No Pain: Pain Assessment Pain Score: 8  Pain Type: Acute pain Pain Location: Back Pain Orientation: Upper Pain Descriptors / Indicators: Aching Pain Onset: On-going Pain Intervention(s):  (premedicated)  See FIM for current functional status  Therapy/Group: Individual Therapy  Wilhemina Bonito 04/10/2013, 9:38 AM

## 2013-04-11 ENCOUNTER — Inpatient Hospital Stay (HOSPITAL_COMMUNITY): Payer: BC Managed Care – PPO | Admitting: Physical Therapy

## 2013-04-11 ENCOUNTER — Inpatient Hospital Stay (HOSPITAL_COMMUNITY): Payer: BC Managed Care – PPO | Admitting: Occupational Therapy

## 2013-04-11 ENCOUNTER — Ambulatory Visit
Admit: 2013-04-11 | Discharge: 2013-04-11 | Disposition: A | Discharge status: Home / Self Care | Payer: BC Managed Care – PPO | Attending: Radiation Oncology | Admitting: Radiation Oncology

## 2013-04-11 ENCOUNTER — Inpatient Hospital Stay (HOSPITAL_COMMUNITY): Payer: BC Managed Care – PPO

## 2013-04-11 DIAGNOSIS — G8220 Paraplegia, unspecified: Secondary | ICD-10-CM

## 2013-04-11 DIAGNOSIS — C720 Malignant neoplasm of spinal cord: Secondary | ICD-10-CM

## 2013-04-11 DIAGNOSIS — C61 Malignant neoplasm of prostate: Secondary | ICD-10-CM

## 2013-04-11 LAB — GLUCOSE, CAPILLARY
Glucose-Capillary: 140 mg/dL — ABNORMAL HIGH (ref 70–99)
Glucose-Capillary: 185 mg/dL — ABNORMAL HIGH (ref 70–99)
Glucose-Capillary: 201 mg/dL — ABNORMAL HIGH (ref 70–99)
Glucose-Capillary: 183 mg/dL — ABNORMAL HIGH (ref 70–99)

## 2013-04-11 MED ORDER — DEXAMETHASONE SODIUM PHOSPHATE 4 MG/ML IJ SOLN
8.0000 mg | Freq: Four times a day (QID) | INTRAMUSCULAR | Status: DC
  Filled 2013-04-11 (×32): qty 2

## 2013-04-11 MED ORDER — DEXAMETHASONE 4 MG PO TABS
8.0000 mg | ORAL_TABLET | Freq: Four times a day (QID) | ORAL | Status: DC
  Administered 2013-04-11 – 2013-04-18 (×29): 8 mg via ORAL
  Filled 2013-04-11 (×32): qty 2

## 2013-04-11 NOTE — Progress Notes (Signed)
Physical Therapy Session Note  Patient Details  Name: Andrew Nelson MRN: [id number] Date of Birth: 03/23/1951  Today's Date: 04/11/2013 Time: 0835-0900 Time Calculation (min): 25 min  Skilled Therapeutic Interventions/Progress Updates:    Attempted stairs forwards then sideways, pt able to place foot up on step but unable to complete step up d/t weakness. Long discussion about appropriateness of building a ramp for home entry. Pt initially resistant to this "I think I'm going to wait and see where I'm at, my main focus is getting around in the house." with further discussion about safety risks such as fires and the need to go to/from MD appointments pt states "you've definitely given me something to think about."  Sit <> stands with min assist today, increased time needed with all tasks due to pain. Pt does not feel a sling will be beneficial for Rt. Arm pain.   Therapy Documentation Precautions:  Precautions Precautions: Fall;Back Precaution Comments: able to recall 2/3 back precautions Restrictions Weight Bearing Restrictions: No Pain: Pain Assessment Pain Assessment: 0-10 Pain Score: 8  Pain Type: Acute pain Pain Location: Arm Pain Orientation: Right Pain Descriptors / Indicators: Aching Pain Onset: On-going Patients Stated Pain Goal: 4 Pain Intervention(s): Repositioned (premedicated)  See FIM for current functional status  Therapy/Group: Individual Therapy  Wilhemina Bonito 04/11/2013, 9:57 AM

## 2013-04-11 NOTE — Progress Notes (Signed)
Occupational Therapy Session Note  Patient Details  Name: Andrew Nelson MRN: [id number] Date of Birth: 1951/01/28  Today's Date: 04/11/2013 Time: 1000-1040 Time Calculation (min): 40 min (missed 20 min due to pain)  Short Term Goals: Week 1:  OT Short Term Goal 1 (Week 1): Pt to be at Min A level for scoot/SB txfr from w/c to drop arm commode OT Short Term Goal 2 (Week 1): Pt to perform LB dressing bed level w/min A OT Short Term Goal 3 (Week 1): Pt to be (I) after s/u for UB ADLs from w/c or EOB level OT Short Term Goal 4 (Week 1): Pt to propel w/c x100 ft w/Supervision and no more than 3 rest breaks  Skilled Therapeutic Interventions/Progress Updates:  Patient resting in bed with wife at his side.  Patient describes 8/10-10/10 pain, right shoulder that radiates down right arm as well as down his right side, constant pain and occasional "spasm" which stops patient in his tracks and he has to find a position to allow the pain to subside a little.  He initially very willing to participate then once in w/c patient politely declined and wanted to get back into bed.  Focused session on bed mobility, scoot pivot bed><w/c and review use of universal cuff for self feeding as well as use of other utensils.  Patient with less AROM in hand than yesterday and reports that he tried to use the red built up foam for self feeding and he reports that he has lost the grasp need to hold the foam.  Therapy Documentation Precautions:  Precautions Precautions: Fall;Back Precaution Comments: able to recall 2/3 back precautions Restrictions Weight Bearing Restrictions: No  ADL: See FIM for current functional status  Therapy/Group: Individual Therapy  Anibal Quinby 04/11/2013, 11:07 AM

## 2013-04-11 NOTE — Progress Notes (Signed)
Occupational Therapy Note  Patient Details  Name: Andrew Nelson MRN: [id number] Date of Birth: 1951/07/03 Today's Date: 04/11/2013  Pt missed 30 mins pm OT treatment session secondary to being gone for radiation treatment.   Clemma Johnsen OTR/L 04/11/2013, 2:31 PM

## 2013-04-11 NOTE — Progress Notes (Signed)
Subjective/Complaints: Continued right arm and axillary pain. A 12 point review of systems has been performed and if not noted above is otherwise negative.   Objective: Vital Signs: Blood pressure 121/75, pulse 60, temperature 97.7 F (36.5 C), temperature source Oral, resp. rate 20, height 5\' 7"  (1.702 m), weight 77.1 kg (169 lb 15.6 oz), SpO2 98.00%. No results found.  Recent Labs  04/10/13 0545  WBC 3.2*  HGB 7.9*  HCT 23.6*  PLT 179   No results found for this basename: NA, K, CL, CO, GLUCOSE, BUN, CREATININE, CALCIUM,  in the last 72 hours CBG (last 3)   Recent Labs  04/10/13 1116 04/10/13 1639 04/10/13 2127  GLUCAP 168* 141* 127*    Wt Readings from Last 3 Encounters:  04/05/13 77.1 kg (169 lb 15.6 oz)  04/07/13 76.658 kg (169 lb)  04/05/13 74.481 kg (164 lb 3.2 oz)    Physical Exam:    Constitutional: He is oriented to person, place, and time.  HENT: oral mucosa pink and moist  Head: Normocephalic.  Eyes: EOM are normal.  Neck: Normal range of motion. Neck supple. No thyromegaly present.  Cardiovascular: Normal rate and regular rhythm.  Pulmonary/Chest: Effort normal and breath sounds normal. No respiratory distress.  Abdominal: Soft. Bowel sounds are normal. He exhibits no distension.  Neurological: He is alert and oriented to person, place, and time.  Mid thoracic sensory level, below which he has 1+ sensation (senses pain stim but has difficulty discerning light touch and proprioception). UE strength 5/5 except for right HI which are 4/5. RLE: HF2-, KE 2 to 2+, ADF and APF 3 to 3+. dtr's tr to 1+ . LLE is gross is grossly 3 to 3+/5 proximal to distal  Musc: vague pain along axilla with radiation down right medial arm Skin:  Back incision is clean and dry  Psychiatric: He has a normal mood and affect. His behavior is normal. Judgment and thought content normal   Assessment/Plan: 1. Functional deficits secondary to metastatic prostate cancer to the  T-spine/L-spine s/p thoracic decompression which require 3+ hours per day of interdisciplinary therapy in a comprehensive inpatient rehab setting. Physiatrist is providing close team supervision and 24 hour management of active medical problems listed below. Physiatrist and rehab team continue to assess barriers to discharge/monitor patient progress toward functional and medical goals. FIM: FIM - Bathing Bathing Steps Patient Completed: Chest;Right Arm;Left Arm;Abdomen;Front perineal area;Right upper leg;Left upper leg;Right lower leg (including foot);Left lower leg (including foot) Bathing: 4: Min-Patient completes 8-9 73f 10 parts or 75+ percent  FIM - Upper Body Dressing/Undressing Upper body dressing/undressing steps patient completed:  (N/A with OT due to time limitations) Upper body dressing/undressing: 6: More than reasonable amount of time FIM - Lower Body Dressing/Undressing Lower body dressing/undressing steps patient completed: Thread/unthread right pants leg;Thread/unthread left pants leg;Pull pants up/down;Don/Doff right shoe;Don/Doff left shoe;Fasten/unfasten right shoe;Fasten/unfasten left shoe Lower body dressing/undressing: 1: Total-Patient completed less than 25% of tasks  FIM - Toileting Toileting: 1: Total-Patient completed zero steps, helper did all 3  FIM - Diplomatic Services operational officer Devices: Grab bars Toilet Transfers: 4-To toilet/BSC: Min A (steadying Pt. > 75%);4-From toilet/BSC: Min A (steadying Pt. > 75%)  FIM - Bed/Chair Transfer Bed/Chair Transfer Assistive Devices: Arm rests Bed/Chair Transfer: 5: Supine > Sit: Supervision (verbal cues/safety issues);5: Chair or W/C > Bed: Supervision (verbal cues/safety issues);5: Bed > Chair or W/C: Supervision (verbal cues/safety issues);3: Sit > Supine: Mod A (lifting assist/Pt. 50-74%/lift 2 legs)  FIM - Locomotion:  Wheelchair Distance: 150' Locomotion: Wheelchair: 0: Activity did not occur FIM -  Locomotion: Ambulation Ambulation/Gait Assistance: Not tested (comment) Locomotion: Ambulation: 0: Activity did not occur  Comprehension Comprehension Mode: Auditory Comprehension: 7-Follows complex conversation/direction: With no assist  Expression Expression Mode: Verbal Expression: 6-Expresses complex ideas: With extra time/assistive device  Social Interaction Social Interaction: 5-Interacts appropriately 90% of the time - Needs monitoring or encouragement for participation or interaction.  Problem Solving Problem Solving: 5-Solves complex 90% of the time/cues < 10% of the time  Memory Memory: 5-Recognizes or recalls 90% of the time/requires cueing < 10% of the time  Medical Problem List and Plan:  1. 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 DVT Prophylaxis/Anticoagulation: Subcutaneous heparin. Monitor platelet counts any signs of bleeding. Check vascular studies on admit.  3. Pain Management: Duragesic patch 75 mcg, hydrocodone and robaxin as needed. Monitor with increased mobility  -adjust regimen as needed for comfort and to allow participation with therapies   -pain appears to be radicular, in right T1,T2 distribution with associated weakness in these areas. May have some C8 involvement also. MRI of the T-spine was notable for the following: Otherwise epidural/ extraosseous tumor in the thoracic spine is most  pronounced at the right T1 neural foramen/costovertebral junction,  the posterior T1 epidural space (series 8, image 3), right T2 neural  foramen, and left T11 pedicle/neural foramen. He also has diffuse mets to ribs which are playing a factor Compression -  -will increase decadron to 8mg   -add gabapentin 300mg  tid  -discuss with rad-onc/oncology regarding re-focus of xrt to upper thoracic areas   4. Neuropsych: This patient is capable of making decisions on his own behalf.  5. Acute blood loss  anemia. Fe supp. Serial hgb's. Recheck monday 6. Hypertension. Cardura 8 mg each bedtime, hydrochlorothiazide 25 mg daily, lisinopril 10 mg daily.  7. Diabetes mellitus with peripheral neuropathy. Hemoglobin A1c is 6. Monitor closely while on Decadron therapy. NovoLog Mix 70/30 20 units daily. Fair control at present. SSI covg  8. Oncology. XRT is underway for adjunctive treatment of his L1,L3, and L5 lesions. XRT will be after therapies each day.  -decadron  -follow up with rad onc regarding treatment of upper thoracic area 9. FEN:  -encourage PO, BUN improving  LOS (Days) 6 A FACE TO FACE EVALUATION WAS PERFORMED  Roderick Calo T 04/11/2013 8:08 AM

## 2013-04-11 NOTE — Progress Notes (Signed)
Occupational Therapy Session Note  Patient Details  Name: Andrew Nelson MRN: [id number] Date of Birth: 1950/09/29  Today's Date: 04/11/2013 Time: 0700-0800 Time Calculation (min): 60 min  Short Term Goals: Week 1:  OT Short Term Goal 1 (Week 1): Pt to be at Min A level for scoot/SB txfr from w/c to drop arm commode OT Short Term Goal 2 (Week 1): Pt to perform LB dressing bed level w/min A OT Short Term Goal 3 (Week 1): Pt to be (I) after s/u for UB ADLs from w/c or EOB level OT Short Term Goal 4 (Week 1): Pt to propel w/c x100 ft w/Supervision and no more than 3 rest breaks  Skilled Therapeutic Interventions/Progress Updates:    Pt resting in bed upon arrival but determined to transfer to w/c to complete bathing and dressing.  Pt required min A for squat pivot transfer to w/c before positioning at sink.  Pt exhibits decreased ability to grasp with Rt hand but uses Lt hand to complete tasks.  Pt requires min A/steady A for sit<>stand and while standing at sink to complete bathing perineal area and buttocks. Pt used long handle sponge to bathe lower legs and reacher to assist with threading pants over BLE.  Pt c/o increased pain in RUE/shoulder throughout session and required rest breaks throughout session.  Pt requires extra time to complete all tasks and required assistance with dressing tasks secondary to time limitations.   Therapy Documentation Precautions:  Precautions Precautions: Fall;Back Precaution Comments:  Restrictions Weight Bearing Restrictions: No Pain: Pain Assessment Pain Assessment: 0-10 Pain Score: 8  Pain Type: Acute pain Pain Location: Arm Pain Orientation: Right Pain Descriptors / Indicators: Aching Pain Onset: On-going Patients Stated Pain Goal: 4  See FIM for current functional status  Therapy/Group: Individual Therapy  Rich Brave 04/11/2013, 9:07 AM

## 2013-04-11 NOTE — Plan of Care (Signed)
Problem: RH PAIN MANAGEMENT Goal: RH STG PAIN MANAGED AT OR BELOW PT'S PAIN GOAL Less than 4 out of 10  Outcome: Not Progressing Patient pain level 6-10 throughout night, tylenol limit met daily

## 2013-04-11 NOTE — Significant Event (Signed)
Late entry: Small dehisced area noted to mid upper incision and distal end incision on 04/10/13.. Dry gauze placed to  areas to assess for drainage. Areas absent of drainage during the day. Oncoming RN notified of dehisced areas and advised to assess during the night. RN reported no drainage during the night. Dry dressing removed. Steristrip placed to dehisced areas. Staff will continue to monitor.

## 2013-04-11 NOTE — Progress Notes (Signed)
Physical Therapy Note  Patient Details  Name: Andrew Nelson MRN: [id number] Date of Birth: [birth year] Today's Date: 04/11/2013  Pt missed 30 min skilled PT. Pt leaving for radiation when PT arrived.    Sherrine Maples Cheek 04/11/2013, 1:53 PM

## 2013-04-12 ENCOUNTER — Inpatient Hospital Stay (HOSPITAL_COMMUNITY): Payer: BC Managed Care – PPO

## 2013-04-12 ENCOUNTER — Inpatient Hospital Stay (HOSPITAL_COMMUNITY): Payer: BC Managed Care – PPO | Admitting: Physical Therapy

## 2013-04-12 ENCOUNTER — Ambulatory Visit
Admit: 2013-04-12 | Discharge: 2013-04-12 | Disposition: A | Discharge status: Home / Self Care | Payer: BC Managed Care – PPO | Attending: Radiation Oncology | Admitting: Radiation Oncology

## 2013-04-12 LAB — GLUCOSE, CAPILLARY
Glucose-Capillary: 170 mg/dL — ABNORMAL HIGH (ref 70–99)
Glucose-Capillary: 214 mg/dL — ABNORMAL HIGH (ref 70–99)

## 2013-04-12 MED ORDER — FENTANYL 25 MCG/HR TD PT72
100.0000 ug | MEDICATED_PATCH | TRANSDERMAL | Status: DC
  Administered 2013-04-12 – 2013-04-15 (×2): 100 ug via TRANSDERMAL
  Filled 2013-04-12 (×2): qty 4

## 2013-04-12 MED ORDER — MORPHINE SULFATE 2 MG/ML IJ SOLN
2.0000 mg | INTRAMUSCULAR | Status: DC | PRN
  Administered 2013-04-12 – 2013-04-18 (×8): 2 mg via INTRAVENOUS
  Filled 2013-04-12 (×9): qty 1

## 2013-04-12 MED ORDER — HYDROMORPHONE HCL 2 MG PO TABS
4.0000 mg | ORAL_TABLET | ORAL | Status: DC | PRN
  Administered 2013-04-12 – 2013-04-18 (×21): 4 mg via ORAL
  Filled 2013-04-12 (×21): qty 2

## 2013-04-12 NOTE — Progress Notes (Signed)
Occupational Therapy Note  Patient Details  Name: Andrew Nelson MRN: [id number] Date of Birth: [birth year] Today's Date: 04/12/2013  Time: 1330-1415 Pt c/o 5/10 dull pain in RUE; RN aware Individual Therapy  Pt resting in bed upon arrival.  Educated pt on drop arm BSC and recommendation for use PRN after discharge. Pt agreeable and thought it was a good idea.  Pt requested to use toilet and wanted to walk to bathroom to use toilet for bowel movement.  Pt amb with RW at steady A.  Pt required tot A for toileting before amb with RW back to bed.  Pt required mod A for sit->supine.  Pt required extra time to complete all tasks.   Lavone Neri [facility] 04/12/2013, 3:52 PM

## 2013-04-12 NOTE — Progress Notes (Signed)
Subjective/Complaints: Had a bad night. Pain continues to increase despite changes being made to medications.    Objective: Vital Signs: Blood pressure 126/66, pulse 71, temperature 97.9 F (36.6 C), temperature source Oral, resp. rate 17, height 5\' 7"  (1.702 m), weight 77.1 kg (169 lb 15.6 oz), SpO2 97.00%. No results found.  Recent Labs  04/10/13 0545  WBC 3.2*  HGB 7.9*  HCT 23.6*  PLT 179   No results found for this basename: NA, K, CL, CO, GLUCOSE, BUN, CREATININE, CALCIUM,  in the last 72 hours CBG (last 3)   Recent Labs  04/11/13 1634 04/11/13 2040 04/12/13 0803  GLUCAP 201* 183* 170*    Wt Readings from Last 3 Encounters:  04/05/13 77.1 kg (169 lb 15.6 oz)  04/07/13 76.658 kg (169 lb)  04/05/13 74.481 kg (164 lb 3.2 oz)    Physical Exam:    Constitutional: He is oriented to person, place, and time. Appears uncomfortable HENT: oral mucosa pink and moist  Head: Normocephalic.  Eyes: EOM are normal.  Neck: Normal range of motion. Neck supple. No thyromegaly present.  Cardiovascular: Normal rate and regular rhythm.  Pulmonary/Chest: Effort normal and breath sounds normal. No respiratory distress.  Abdominal: Soft. Bowel sounds are normal. He exhibits no distension.  Neurological: He is alert and oriented to person, place, and time.  Mid thoracic sensory level, below which he has 1+ sensation (senses pain stim but has difficulty discerning light touch and proprioception). UE strength 5/5 except for right HI which are 1-2/5 currently, perhaps less.. RLE: HF2-, KE 2 to 2+, ADF and APF 3 to 3+. dtr's tr to 1+ . LLE is gross is grossly 3 to 3+/5 proximal to distal  Musc: vague pain along axilla with radiation down right medial arm Skin:  Back incision is clean and dry  Psychiatric: appears fatigued, a little disoriented, anxious   Assessment/Plan: 1. 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 Physiatrist is providing close team supervision and 24 hour management of active medical problems listed below. Physiatrist and rehab team continue to assess barriers to discharge/monitor patient progress toward functional and medical goals. FIM: FIM - Bathing Bathing Steps Patient Completed: Chest;Right Arm;Left Arm;Abdomen;Front perineal area;Right upper leg;Left upper leg;Right lower leg (including foot);Left lower leg (including foot) Bathing: 4: Min-Patient completes 8-9 52f 10 parts or 75+ percent  FIM - Upper Body Dressing/Undressing Upper body dressing/undressing steps patient completed: Thread/unthread right sleeve of pullover shirt/dresss;Thread/unthread left sleeve of pullover shirt/dress;Put head through opening of pull over shirt/dress Upper body dressing/undressing: 4: Min-Patient completed 75 plus % of tasks FIM - Lower Body Dressing/Undressing Lower body dressing/undressing steps patient completed: Thread/unthread right pants leg;Thread/unthread right underwear leg;Thread/unthread left underwear leg Lower body dressing/undressing: 2: Max-Patient completed 25-49% of tasks  FIM - Toileting Toileting: 1: Total-Patient completed zero steps, helper did all 3  FIM - Diplomatic Services operational officer Devices: Grab bars Toilet Transfers: 4-To toilet/BSC: Min A (steadying Pt. > 75%);4-From toilet/BSC: Min A (steadying Pt. > 75%)  FIM - Bed/Chair Transfer Bed/Chair Transfer Assistive Devices: Arm rests Bed/Chair Transfer: 5: Supine > Sit: Supervision (verbal cues/safety issues);5: Chair or W/C > Bed: Supervision (verbal cues/safety issues);5: Bed > Chair or W/C: Supervision (verbal cues/safety issues);3: Sit > Supine: Mod A (lifting assist/Pt. 50-74%/lift 2 legs)  FIM - Locomotion: Wheelchair Distance: 150' Locomotion: Wheelchair: 1: Total Assistance/staff pushes wheelchair  (Pt<25%)  FIM - Locomotion: Ambulation Ambulation/Gait Assistance: Not tested (comment) Locomotion: Ambulation: 0: Activity did not occur  Comprehension Comprehension Mode: Auditory Comprehension: 7-Follows complex conversation/direction: With no assist  Expression Expression Mode: Verbal Expression: 6-Expresses complex ideas: With extra time/assistive device  Social Interaction Social Interaction: 5-Interacts appropriately 90% of the time - Needs monitoring or encouragement for participation or interaction.  Problem Solving Problem Solving: 5-Solves complex 90% of the time/cues < 10% of the time  Memory Memory: 5-Recognizes or recalls 90% of the time/requires cueing < 10% of the time  Medical Problem List and Plan:  1. 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 DVT Prophylaxis/Anticoagulation: Subcutaneous heparin. Monitor platelet counts any signs of bleeding. Check vascular studies on admit.  3. Pain Management: Duragesic patch 75 mcg, hydrocodone and robaxin as needed. Monitor with increased mobility  -adjust regimen as needed for comfort and to allow participation with therapies   -pain appears to be radicular, in right T1,T2 distribution with associated weakness in these areas. May have some C8 involvement also. MRI of the T-spine was notable for the following: Otherwise epidural/ extraosseous tumor in the thoracic spine is most  pronounced at the right T1 neural foramen/costovertebral junction,  the posterior T1 epidural space (series 8, image 3), right T2 neural  foramen, and left T11 pedicle/neural foramen. He also has diffuse mets to ribs which are playing a factor Compression -  -increased decadron to 8mg   -increase fentanyl patch to  -added gabapentin 300mg  tid  -added prn dilaudid and iv mso4  -see discussion below   4. Neuropsych: This patient is capable of making decisions on his own behalf.   5. Acute blood loss anemia. Fe supp. Serial hgb's. Recheck today. 6. Hypertension. Cardura 8 mg each bedtime, hydrochlorothiazide 25 mg daily, lisinopril 10 mg daily.  7. Diabetes mellitus with peripheral neuropathy. Hemoglobin A1c is 6. Monitor closely while on Decadron therapy. NovoLog Mix 70/30 20 units daily. Fair control at present. SSI covg  8. Oncology. XRT is underway for adjunctive treatment of his L1,L3, and L5 lesions. XRT will be after therapies each day.  -decadron increased -spoke to XRT yesterday regarding use of palliate xrt to thoracic area -have put in a call to [name] to discuss case in general. He is not doing well, and at this point we may need to consider going a different direction with his care. 9. FEN:  -encourage PO  LOS (Days) 7 A FACE TO FACE EVALUATION WAS PERFORMED  Kerianna Rawlinson T 04/12/2013 8:11 AM

## 2013-04-12 NOTE — Patient Care Conference (Signed)
Inpatient RehabilitationTeam Conference and Plan of Care Update Date: 04/11/2013   Time: 2:30 PM    Patient Name: Andrew Nelson      Medical Record Number: [id number]  Date of Birth: 10-Mar-1951 Sex: Male         Room/Bed: 4W19C/4W19C-01 Payor Info: Payor: BLUE CROSS BLUE SHIELD / Plan: BCBS PPO OUT OF STATE / Product Type: *No Product type* /    Admitting Diagnosis: THORACIC DECOMPRESSION  Admit Date/Time:  04/05/2013  3:51 PM Admission Comments: No comment available   Primary Diagnosis:  Metastatic cancer Principal Problem: Metastatic cancer  Patient Active Problem List   Diagnosis Date Noted  . Metastatic cancer 04/06/2013  . Protein-calorie malnutrition, moderate 04/04/2013  . Weakness of both legs 03/30/2013  . Anemia of chronic disease 03/30/2013  . Hyponatremia 03/30/2013  . 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 Hypertension   . Avascular necrosis of femur head, right 02/25/2012  . DM w/o Complication Type I 10/14/2007  . TOBACCO ABUSE 04/12/2007  . PROSTATE CANCER, HX OF 04/12/2007    Expected Discharge Date: Expected Discharge Date: 04/27/13  Team Members Present: Physician leading conference: Dr. Faith Rogue Social Worker Present: Amada [place], LCSW PT Present: Karolee Stamps, PT OT Present: Ardis Rowan, Corky Crafts, OT;Patricia [place], OT PPS Coordinator present : Tora Duck, RN, CRRN;Becky Henrene Dodge, PT     Current Status/Progress Goal Weekly Team Focus  Medical   metastatic prostate cancer with mets to spine/multiple bones  pain mgt, increase exercise tolerance  radiation therapy, adjustments to location? pain control   Bowel/Bladder   Contient of bowel and bladder  Remain continent of bowel and bladder      Swallow/Nutrition/ Hydration             ADL's   UB bathing and dressing-supervision; LB bathng-mod A; LB dressing-tot A  dressing-mod/I/I; bathing-supervision; toilet transfers-supervision; toileting-mod I  activitytolerance; pain  mgmt; transers; standing balance   Mobility   Min assist transfers and very short distance ambulation, mod assist sit >supine  Supervision gait, mod assist steps  Endurance, pain modulation, ambulation, balance, stairs   Communication             Safety/Cognition/ Behavioral Observations            Pain   Pain uncontrolled with meds  Pain managed with scheduled and prn medications  meds changed added dilaudid and morphine; monitor effectiveness of meds and effectiveness of pain control   Skin   incision to upper and lower back: upper -small open spot no drainage noted, guaze and tape, lower small opening no drainage noted OTA  No new skin breakdown/infection       Rehab Goals Patient on target to meet rehab goals: Yes *See Care Plan and progress notes for long and short-term goals.  Barriers to Discharge: pain, progression of cancer    Possible Resolutions to Barriers:  pacing, adaptive equipment, counseling    Discharge Planning/Teaching Needs:  home with wife and family to provide 24/7 assistance vs SNF - need to plan to educate family on lower levels of care for longer term planning      Team Discussion:  Mets extending with plan to increase radiation area.  Need to begin discussing hoyer lift transfers as prognosis is worsening.  Anticipate heavy minimal assist levels for wife, however, could worsen quickly.  MD to as input from oncology.    Revisions to Treatment Plan:  None at this time   Continued Need for  Acute Rehabilitation Level of Care: The patient requires daily medical management by a physician with specialized training in physical medicine and rehabilitation for the following conditions: Daily direction of a multidisciplinary physical rehabilitation program to ensure safe treatment while eliciting the highest outcome that is of practical value to the patient.: Yes Daily medical management of patient stability for increased activity during participation in an  intensive rehabilitation regime.: Yes Daily analysis of laboratory values and/or radiology reports with any subsequent need for medication adjustment of medical intervention for : Post surgical problems;Other;Neurological problems  Andrew Nelson 04/12/2013, 4:54 PM

## 2013-04-12 NOTE — Progress Notes (Signed)
Occupational Therapy Session Note  Patient Details  Name: Andrew Nelson MRN: [id number] Date of Birth: Dec 11, 1950  Today's Date: 04/12/2013 Time: 0700-0800 Time Calculation (min): 60 min  Short Term Goals: Week 1:  OT Short Term Goal 1 (Week 1): Pt to be at Min A level for scoot/SB txfr from w/c to drop arm commode OT Short Term Goal 2 (Week 1): Pt to perform LB dressing bed level w/min A OT Short Term Goal 3 (Week 1): Pt to be (I) after s/u for UB ADLs from w/c or EOB level OT Short Term Goal 4 (Week 1): Pt to propel w/c x100 ft w/Supervision and no more than 3 rest breaks  Skilled Therapeutic Interventions/Progress Updates:    Pt resting in bed talking to MD upon arrival.  Pt stated his right arm and axilla are more painful than previous day and meds do not appear to be helping much.  MD stated he would make medication adjustments.  RN notified of pt's pain.  Pt sated he wanted to try and start his routine before medications arrived.  Pt agreed that the best option this morning for bathing and dressing would be to sit EOB.  Pt required multiple breaks throughout session secondary to increased pain in RUE with any movement.  After completing UB bathing patient stated he needed to lay back in bed for positioning of RUE and to reduce pain.  Pt required mod A for sit->supine.  Pt was able to bathe front perineal area while supine in bed but required assistance with legs and with LB dressing.  Pt ws able to don shirt with HOB elevated but required assistance to pull over trunk.  Pt remained in bed at end of session with call bell within reach and bed alarm set.    Therapy Documentation Precautions:  Precautions Precautions: Fall;Back Precaution Comments:  Restrictions Weight Bearing Restrictions: No   Pain: Pain Assessment Pain Assessment: 0-10 Pain Score: 10-Worst pain ever Pain Type: Acute pain Pain Location: Axilla Pain Orientation: Right Pain Descriptors / Indicators:  Aching;Constant;Discomfort Pain Onset: On-going Pain Intervention(s): RN made aware (Provided medication)  See FIM for current functional status  Therapy/Group: Individual Therapy  Rich Brave 04/12/2013, 10:23 AM

## 2013-04-12 NOTE — Progress Notes (Signed)
Occupational Therapy Session Note  Patient Details  Name: Andrew Nelson MRN: [id number] Date of Birth: 06-24-1951  Today's Date: 04/12/2013 Time: 0930-1015  Time Calculation (min): 45 min  Short Term Goals: Week 1:  OT Short Term Goal 1 (Week 1): Pt to be at Min A level for scoot/SB txfr from w/c to drop arm commode OT Short Term Goal 2 (Week 1): Pt to perform LB dressing bed level w/min A OT Short Term Goal 3 (Week 1): Pt to be (I) after s/u for UB ADLs from w/c or EOB level OT Short Term Goal 4 (Week 1): Pt to propel w/c x100 ft w/Supervision and no more than 3 rest breaks  Skilled Therapeutic Interventions: ADL-retraining with emphasis on bed mobility, static/dynamic sitting balance, and transfers using sliding board. With extra time and rest breaks as needed, patient completed bed mobility unassisted using bed rails to roll to his left and rise from supine to sitting at EOB.  Patient performed sliding board transfer from bed to w/c with OT assist to remove sliding board after transfer.   Patient recovered from transfer and requested steadying assist with toilet transfer to void urine.  Patient completed transfer to/from toilet with supervision and required moderate assist to manage clothing.  Following toileting patient requested bed rest and required max assist to lift both legs back into bed while side-lying on left side.    Therapy Documentation Precautions:  Precautions Precautions: Fall;Back Precaution Comments: able to recall 2/3 back precautions Restrictions Weight Bearing Restrictions: No  Pain: Pain Assessment Pain Assessment: 0-10 Pain Score: 9  Pain Location: Shoulder Pain Orientation: Right Pain Descriptors / Indicators: Aching;Dull Pain Onset: On-going Patients Stated Pain Goal: 7 Pain Intervention(s): Medication (See eMAR)  See FIM for current functional status  Therapy/Group: Individual Therapy  Georgeanne Nim 04/12/2013, 4:11 PM

## 2013-04-12 NOTE — Progress Notes (Signed)
Physical Therapy Session Note  Patient Details  Name: Andrew Nelson MRN: [id number] Date of Birth: [birth year]  Today's Date: 04/12/2013 Time: 8469-6295 Time Calculation (min): 38 min  Skilled Therapeutic Interventions/Progress Updates:   Missed first few min of therapy due to IV therapy. Unable to get OOB this session d/t pain                                                                                                                                                                                                                                                Discussed home environment mobility and situations with pt and wife. PT demonstrated sliding board transfer, wife practiced providing supervision and blocking knees/holding board. Discussed hoyer lift and benefits of having a "contingency plan" for painful days, both receptive. Wife shown hoyer lift and receptive to practicing with device as pt tolerates. Hospital bed recommended as pt will not safely be able access second floor  Pt missed 22 min skilled PT time due to pain.  Therapy Documentation Precautions:  Precautions Precautions: Fall;Back Precaution Comments: able to recall 2/3 back precautions Restrictions Weight Bearing Restrictions: No Pain: Pain Assessment Pain Assessment: 0-10 Pain Score: 10-Worst pain ever Pain Type: Acute pain Pain Location: Axilla Pain Orientation: Right Pain Descriptors / Indicators: Aching;Constant;Discomfort Pain Onset: On-going Pain Intervention(s): RN made aware (Provided medication) See FIM for current functional status  Therapy/Group: Individual Therapy  Wilhemina Bonito 04/12/2013, 9:27 AM

## 2013-04-13 ENCOUNTER — Inpatient Hospital Stay (HOSPITAL_COMMUNITY): Payer: BC Managed Care – PPO | Admitting: Physical Therapy

## 2013-04-13 ENCOUNTER — Ambulatory Visit
Admit: 2013-04-13 | Discharge: 2013-04-13 | Disposition: A | Discharge status: Home / Self Care | Payer: BC Managed Care – PPO | Attending: Radiation Oncology | Admitting: Radiation Oncology

## 2013-04-13 ENCOUNTER — Inpatient Hospital Stay (HOSPITAL_COMMUNITY): Payer: BC Managed Care – PPO

## 2013-04-13 ENCOUNTER — Inpatient Hospital Stay (HOSPITAL_COMMUNITY): Payer: BC Managed Care – PPO | Admitting: Rehabilitation

## 2013-04-13 DIAGNOSIS — C7951 Secondary malignant neoplasm of bone: Secondary | ICD-10-CM

## 2013-04-13 DIAGNOSIS — C61 Malignant neoplasm of prostate: Secondary | ICD-10-CM

## 2013-04-13 DIAGNOSIS — Z8546 Personal history of malignant neoplasm of prostate: Secondary | ICD-10-CM

## 2013-04-13 LAB — GLUCOSE, CAPILLARY
Glucose-Capillary: 214 mg/dL — ABNORMAL HIGH (ref 70–99)
Glucose-Capillary: 168 mg/dL — ABNORMAL HIGH (ref 70–99)
Glucose-Capillary: 209 mg/dL — ABNORMAL HIGH (ref 70–99)

## 2013-04-13 NOTE — Progress Notes (Signed)
Events in the last week noted.  Patient developed pain and weakness in his right arm. With IV pain medication is better.  Discussed with Dr. Riley Kill as well and he feels that have slowed his participation in rehab.   Exam:   Alert and oriented gentleman did not appear any distress. Vitals were stable. Heart is regular rate and rhythm. Lungs clear auscultation abdomen soft without any rebound or guarding. Extremities with no edema. He has significant weakness in his right upper arm.  Impression and plan: Advanced prostate cancer with multiple spinal involvement status post surgical intervention radiation therapy and currently receiving acute rehabilitation. Unfortunately, we are dealing with a rather aggressive and progressive advanced prostate cancer despite his age and her willingness to participate in rehabilitation and willingness to improve this cancer is moving rapidly and have not responded very well to systemic chemotherapy. I agree with the current plan of pain management accelerated rehabilitation and hopefully discharge home next week. In the meantime I will continue to discuss with him poor prognosis associated with it. The feeling that I get from him today but he was to continue to be very aggressive in an effort to improve his quality of life.  We'll continue to follow with you while he is in rehabilitation to to assist in any way possible.

## 2013-04-13 NOTE — Progress Notes (Signed)
Physical Therapy Weekly Progress Note  Patient Details  Name: Andrew Nelson MRN: [id number] Date of Birth: Aug 27, 1950  Today's Date: 04/13/2013 Time: 0830-0925 Time Calculation (min): 55 min  Short term goals not set due to expected length of stay. Pt progressing towards long term goals however goals have been adjusted due to pt's prognosis and anticipated functional decline.  Patient continues to demonstrate the following deficits: decreased endurance, strength, gait mechanics, pain, decreased proprioception of Lt. LE and therefore will continue to benefit from skilled PT intervention to enhance overall performance with activity tolerance, balance, ability to compensate for deficits and functional use of  left lower extremity. Per MD pt has poor prognosis given rapid metastasis and focus of treatment has become preparing wife and pt for safe transfers during pt's functional decline. Pt has 3 steps to enter and will benefit from a ramp however he has been resistant to follow through with therapists recommendations, he may require ambulance transfer home as he has been unable to ascend steps during therapy yet.   Patient progressing toward long term goals..  Plan of care revisions: Goals downgraded due to slow progress and anticipated prognosis.  PT Short Term Goals Week 1:  PT Short Term Goal 1 (Week 1): = LTGs PT Short Term Goal 1 - Progress (Week 1): Progressing toward goal  Skilled Therapeutic Interventions/Progress Updates:     Session focused on hoyer lift training. Pt transferred bed <> wheelchair using hoyer lift. Wife educated on sling placement in bed and wheelchair, actively participating in placement. Wife managed and negotiated lift to/from wheelchair with cues from therapist. Both happy with transfer and feel it is a good "back up plan" for home. Further practice recommended.   Pt with much improved pain control this session, even smiling!  Therapy Documentation Precautions:   Precautions Precautions: Fall;Back Precaution Comments: able to recall 2/3 back precautions Restrictions Weight Bearing Restrictions: No Pain: Pain Assessment Pain Assessment: 0-10 Pain Score: 8  Faces Pain Scale: Hurts little more Pain Type: Acute pain Pain Location: Axilla Pain Orientation: Right Pain Descriptors / Indicators: Aching;Sharp Pain Onset: Sudden Pain Intervention(s): Repositioned (rest as needed)  See FIM for current functional status  Therapy/Group: Individual Therapy  Wilhemina Bonito 04/13/2013, 11:52 AM

## 2013-04-13 NOTE — Progress Notes (Signed)
Occupational Therapy Session Note  Patient Details  Name: Andrew Nelson MRN: [id number] Date of Birth: 1951/07/13  Today's Date: 04/13/2013  Session 1 Time: 0700-0800 Time Calculation (min): 60 min  Short Term Goals: Week 1:  OT Short Term Goal 1 (Week 1): Pt to be at Min A level for scoot/SB txfr from w/c to drop arm commode OT Short Term Goal 2 (Week 1): Pt to perform LB dressing bed level w/min A OT Short Term Goal 3 (Week 1): Pt to be (I) after s/u for UB ADLs from w/c or EOB level OT Short Term Goal 4 (Week 1): Pt to propel w/c x100 ft w/Supervision and no more than 3 rest breaks  Skilled Therapeutic Interventions/Progress Updates:    Pt resting in bed with wife at side upon arrival.  Pt amb with RW from bed to w/c at sink with min A.steady A.  Pt completed bathing and dressing tasks with sit<>stand at sink.  Pt's wife observed throughout session.  Demonstrated use of drop arm BSC to wife and discussed use of hospital bed at home after discharge.  Both pt and wife thought both items were a good idea.  Pt requires extra time for rest breaks and when pain in RUE/shoulder increases.  Pt required steady A when standing to bathe buttocks.  Pt required tot A for LB dressing this morning secondary to time constraints.  Discussed schedule at home after discharge and energy conservation.  Pt regulates energy level appropriately and makes safe and appropriate decisions as it pertains to energy levels and ability to complete tasks without assistance.  Discussed with patient and wife the fact that some days he will be able to complete tasks with increased independence and some days he will require more assistance.  Pt and wife verbalized understanding.  Focus on activity tolerance, functional amb with RW, standing balance, family education, and safety awareness.  Therapy Documentation Precautions:  Precautions Precautions: Fall;Back Precaution Comments: able to recall 2/3 back  precautions Restrictions Weight Bearing Restrictions: No Pain: Pain Assessment Pain Assessment: 0-10 Pain Score: 8  Faces Pain Scale: No hurt Pain Type: Acute pain Pain Location: Shoulder Pain Orientation: Right Pain Descriptors / Indicators: Aching;Sharp Pain Onset: Progressive Pain Intervention(s): RN made aware;Repositioned  See FIM for current functional status  Therapy/Group: Individual Therapy  Session 2 Pt missed 45 mins skilled Ot services.  Pt had just returned from radiation treatment.  Rn administering meds and patient needed to eat lunch after medications administered.  Lavone Neri [facility] 04/13/2013, 8:09 AM

## 2013-04-13 NOTE — Progress Notes (Addendum)
Subjective/Complaints: Feeling much better today. meds working for pain.  A 12 point review of systems has been performed and if not noted above is otherwise negative.    Objective: Vital Signs: Blood pressure 127/59, pulse 70, temperature 98.5 F (36.9 C), temperature source Oral, resp. rate 19, height 5\' 7"  (1.702 m), weight 77.1 kg (169 lb 15.6 oz), SpO2 98.00%. No results found. No results found for this basename: WBC, HGB, HCT, PLT,  in the last 72 hours No results found for this basename: NA, K, CL, CO, GLUCOSE, BUN, CREATININE, CALCIUM,  in the last 72 hours CBG (last 3)   Recent Labs  04/12/13 1222 04/12/13 1637 04/12/13 2129  GLUCAP 214* 192* 184*    Wt Readings from Last 3 Encounters:  04/05/13 77.1 kg (169 lb 15.6 oz)  04/07/13 76.658 kg (169 lb)  04/05/13 74.481 kg (164 lb 3.2 oz)    Physical Exam:    Constitutional: He is oriented to person, place, and time. Appears uncomfortable HENT: oral mucosa pink and moist  Head: Normocephalic.  Eyes: EOM are normal.  Neck: Normal range of motion. Neck supple. No thyromegaly present.  Cardiovascular: Normal rate and regular rhythm.  Pulmonary/Chest: Effort normal and breath sounds normal. No respiratory distress.  Abdominal: Soft. Bowel sounds are normal. He exhibits no distension.  Neurological: He is alert and oriented to person, place, and time.  Mid thoracic sensory level, below which he has 1+ sensation (senses pain stim but has difficulty discerning light touch and proprioception). UE strength 5/5 except for right HI which are 1+ currently, perhaps less.. RLE: HF2-, KE 2 to 2+, ADF and APF 3 to 3+. dtr's tr to 1+ . LLE is gross is grossly 3 to 3+/5 proximal to distal  Musc: vague pain along axilla with radiation down right medial arm Skin:  Back incision is clean and dry  Psychiatric: appears fatigued, a little disoriented, anxious   Assessment/Plan: 1. Functional deficits secondary to metastatic prostate  cancer to the T-spine/L-spine s/p thoracic decompression which require 3+ hours per day of interdisciplinary therapy in a comprehensive inpatient rehab setting. Physiatrist is providing close team supervision and 24 hour management of active medical problems listed below. Physiatrist and rehab team continue to assess barriers to discharge/monitor patient progress toward functional and medical goals.  Given the situation, I think it would be prudent to accelerate/adjust rehab plans to focus on adaptive equipment, family education, pain mgt and dc home sooner than later. I think we should be focusing on dc for next week perhaps. (scheduled for 9/18 currently)   FIM: FIM - Bathing Bathing Steps Patient Completed: Chest;Right Arm;Left Arm;Abdomen;Front perineal area;Right upper leg;Left upper leg Bathing: 3: Mod-Patient completes 5-7 62f 10 parts or 50-74%  FIM - Upper Body Dressing/Undressing Upper body dressing/undressing steps patient completed: Thread/unthread right sleeve of pullover shirt/dresss;Thread/unthread left sleeve of pullover shirt/dress;Put head through opening of pull over shirt/dress Upper body dressing/undressing: 4: Min-Patient completed 75 plus % of tasks FIM - Lower Body Dressing/Undressing Lower body dressing/undressing steps patient completed: Thread/unthread right pants leg;Thread/unthread right underwear leg;Thread/unthread left underwear leg Lower body dressing/undressing: 1: Total-Patient completed less than 25% of tasks  FIM - Toileting Toileting: 1: Total-Patient completed zero steps, helper did all 3  FIM - Diplomatic Services operational officer Devices: Grab bars Toilet Transfers: 4-To toilet/BSC: Min A (steadying Pt. > 75%);4-From toilet/BSC: Min A (steadying Pt. > 75%)  FIM - Bed/Chair Transfer Bed/Chair Transfer Assistive Devices: Arm rests Bed/Chair Transfer: 5: Supine > Sit:  Supervision (verbal cues/safety issues);3: Sit > Supine: Mod A (lifting  assist/Pt. 50-74%/lift 2 legs)  FIM - Locomotion: Wheelchair Distance: 150' Locomotion: Wheelchair: 1: Total Assistance/staff pushes wheelchair (Pt<25%) FIM - Locomotion: Ambulation Ambulation/Gait Assistance: Not tested (comment) Locomotion: Ambulation: 0: Activity did not occur  Comprehension Comprehension Mode: Auditory Comprehension: 7-Follows complex conversation/direction: With no assist  Expression Expression Mode: Verbal Expression: 6-Expresses complex ideas: With extra time/assistive device  Social Interaction Social Interaction: 5-Interacts appropriately 90% of the time - Needs monitoring or encouragement for participation or interaction.  Problem Solving Problem Solving: 5-Solves complex 90% of the time/cues < 10% of the time  Memory Memory: 5-Recognizes or recalls 90% of the time/requires cueing < 10% of the time  Medical Problem List and Plan:  1. 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 DVT Prophylaxis/Anticoagulation: Subcutaneous heparin. Monitor platelet counts any signs of bleeding. Check vascular studies on admit.  3. Pain Management: Duragesic patch 75 mcg, hydrocodone and robaxin as needed. Monitor with increased mobility  -adjust regimen as needed for comfort and to allow participation with therapies   -pain appears to be radicular, in right T1,T2 distribution with associated weakness in these areas. May have some C8 involvement also. MRI of the T-spine was notable for the following: Otherwise epidural/ extraosseous tumor in the thoracic spine is most  pronounced at the right T1 neural foramen/costovertebral junction,  the posterior T1 epidural space (series 8, image 3), right T2 neural  foramen, and left T11 pedicle/neural foramen. He also has diffuse mets to ribs which are playing a factor Compression -  -increased decadron to 8mg   -increase fentanyl patch to  -added gabapentin  300mg  tid  -added prn dilaudid and iv mso4  -pain improved   4. Neuropsych: This patient is capable of making decisions on his own behalf.  5. Acute blood loss anemia. Fe supp. Serial hgb's. Recheck today. 6. Hypertension. Cardura 8 mg each bedtime, hydrochlorothiazide 25 mg daily, lisinopril 10 mg daily.  7. Diabetes mellitus with peripheral neuropathy. Hemoglobin A1c is 6. Monitor closely while on Decadron therapy. NovoLog Mix 70/30 20 units daily. Fair control at present. SSI covg  8. Oncology. XRT is underway for adjunctive treatment of his L1,L3, and L5 lesions. XRT will be after therapies each day.  -decadron increased -spoke to XRT yesterday regarding use of palliate xrt to thoracic area--unclear whether this has been discussed by that team. -[name] has discussed prognosis, situation with pt as well today.  -9. FEN:  -encourage PO  LOS (Days) 8 A FACE TO FACE EVALUATION WAS PERFORMED  Tyshia Fenter T 04/13/2013 8:00 AM

## 2013-04-13 NOTE — Progress Notes (Signed)
  Radiation Oncology         [phone] ________________________________  Name: Andrew Nelson MRN: [id number]  Date: 04/13/2013  DOB: [birth year]  INPATIENT  SIMULATION AND TREATMENT PLANNING NOTE  NARRATIVE:  The patient was brought to the CT Simulation planning suite.  Identity was confirmed.  All relevant records and images related to the planned course of therapy were reviewed.  The patient freely provided informed written consent to proceed with treatment after reviewing the details related to the planned course of therapy. The consent form was witnessed and verified by the simulation staff.  Then, the patient was set-up in a stable reproducible  supine position for radiation therapy.  CT images were obtained.  Surface markings were placed.  The CT images were loaded into the planning software.  Then the target and avoidance structures were contoured.  Treatment planning then occurred.  The radiation prescription was entered and confirmed.  Then, I designed and supervised the construction of a total of 2 medically necessary complex treatment devices to shape radiation around the C7 through T8 vertebral bodies, while shielding the spinal cord over T7 and T8 from previous radiation.  I have requested : Isodose Plan.    SPECIAL TREATMENT PROCEDURE:  The planned course of therapy using radiation constitutes a special treatment procedure. Special care is required in the management of this patient for the following reasons.  I have requested : This treatment constitutes a Special Treatment Procedure for the following reason: [ Retreatment in a previously radiated area requiring careful monitoring of increased risk of toxicity due to overlap of previous treatment.. The special nature of the planned course of radiotherapy will require increased physician supervision and oversight to ensure patient's safety with optimal treatment outcomes.  PLAN:  The patient will receive 30 Gy in 10  fractions.  ________________________________  [name], M.D.

## 2013-04-13 NOTE — Plan of Care (Signed)
Problem: RH Bathing Goal: LTG Patient will bathe with assist, cues/equipment (OT) LTG: Patient will bathe specified number of body parts with assist with/without cues using equipment (position) (OT)  LTG downgraded to Min A  Problem: RH Tub/Shower Transfers Goal: LTG Patient will perform tub/shower transfers w/assist (OT) LTG: Patient will perform tub/shower transfers with assist, with/without cues using equipment (OT)  Goal discontinued as pt will be unable to reach 2nd floor of home upon d/c due to inability to negotiate stairs.

## 2013-04-13 NOTE — Progress Notes (Signed)
Pt continues to request pain meds q 2 hours; states min'l relief after doses. Pain greatest at sternal, rt arm and rib area.Radiation markings intact. Note for MD re: ? sustained release pain med.

## 2013-04-13 NOTE — Progress Notes (Signed)
Physical Therapy Session Note  Patient Details  Name: FITZROY MIKAMI MRN: [id number] Date of Birth: [birth year]  Today's Date: 04/13/2013 Time: 1530-1611 Time Calculation (min): 41 min  Short Term Goals: Week 1:  PT Short Term Goal 1 (Week 1): = LTGs PT Short Term Goal 1 - Progress (Week 1): Progressing toward goal  Skilled Therapeutic Interventions/Progress Updates:   Focus of session was w/c propulsion for increased strength and endurance, gait training and transfer training with slideboard.  Pt self propelled w/c 70' to gym with supervision/min assist (for steering) due to weakness in RUE.  Cues for direction changes and safety.  Gait trained x 15' x 1 and 10' x 1 with RW at min assist with cues for upright posture, increased quad activation and weight shift on stance leg and increased step lengths.  Pt fatigues very quickly but very willing to ambulate this afternoon.  Performed slideboard transfer to R with cues for wife to place slideboard and pt performing transfer at supervision level.  Provided cues that if pt were in increased pain or weakness, she may need to be closer to him anteriorly to prevent forward translation.  Pt left in bed with wife present and all needs in place.   Therapy Documentation Precautions:  Precautions Precautions: Fall;Back Precaution Comments: able to recall 2/3 back precautions Restrictions Weight Bearing Restrictions: No   Pain: Pain Assessment Faces Pain Scale: Hurts little more Pain Type: Acute pain Pain Location: Axilla Pain Orientation: Right Pain Descriptors / Indicators: Aching Pain Onset: On-going Pain Intervention(s): RN made aware    Locomotion : Ambulation Ambulation/Gait Assistance: 3: Mod assist;4: Min assist   See FIM for current functional status  Therapy/Group: Individual Therapy  Vista Deck 04/13/2013, 5:32 PM

## 2013-04-13 NOTE — Progress Notes (Signed)
Physical Therapy Session Note  Patient Details  Name: Andrew Nelson MRN: [id number] Date of Birth: 1951/02/06  Today's Date: 04/13/2013 Time: 1500-1530 Time Calculation (min): 30 min  Short Term Goals: Week 1:  PT Short Term Goal 1 (Week 1): = LTGs PT Short Term Goal 1 - Progress (Week 1): Progressing toward goal  Skilled Therapeutic Interventions/Progress Updates:    Pt agreeable to try to "make up" therapy time due to missed time from radiation treatment. Pt performed supine>sit with supervision and increased time. Sliding board transfer education with wife and pt bed > wheelchair. Wife able to provide set up assist, pt able to perform transfer without assist however both educated on assistance could be provided. Ambulation x 20' with RW and min/mod assist for weak legs and Lt. Lean. Decreased proprioception of Lt. LE during placement during gait and occasionally locks knee out. Unclear if it will be safe for wife to ambulate with pt at home yet.   Therapy Documentation Precautions:  Precautions Precautions: Fall;Back Precaution Comments: able to recall 2/3 back precautions Restrictions Weight Bearing Restrictions: No Pain: Pain Assessment Faces Pain Scale: Hurts little more Pain Type: Acute pain Pain Location: Axilla Pain Orientation: Right Pain Descriptors / Indicators: Aching Pain Onset: On-going Pain Intervention(s): RN made aware  See FIM for current functional status  Therapy/Group: Individual Therapy  Wilhemina Bonito 04/13/2013, 3:35 PM

## 2013-04-14 ENCOUNTER — Ambulatory Visit
Admit: 2013-04-14 | Discharge: 2013-04-14 | Disposition: A | Discharge status: Home / Self Care | Payer: BC Managed Care – PPO | Attending: Radiation Oncology | Admitting: Radiation Oncology

## 2013-04-14 ENCOUNTER — Inpatient Hospital Stay (HOSPITAL_COMMUNITY): Payer: BC Managed Care – PPO | Admitting: Physical Therapy

## 2013-04-14 ENCOUNTER — Encounter: Payer: Self-pay | Admitting: Radiation Oncology

## 2013-04-14 ENCOUNTER — Encounter (HOSPITAL_COMMUNITY): Payer: BC Managed Care – PPO

## 2013-04-14 ENCOUNTER — Inpatient Hospital Stay (HOSPITAL_COMMUNITY): Payer: BC Managed Care – PPO

## 2013-04-14 DIAGNOSIS — C61 Malignant neoplasm of prostate: Secondary | ICD-10-CM

## 2013-04-14 DIAGNOSIS — Z8546 Personal history of malignant neoplasm of prostate: Secondary | ICD-10-CM

## 2013-04-14 DIAGNOSIS — C720 Malignant neoplasm of spinal cord: Secondary | ICD-10-CM

## 2013-04-14 DIAGNOSIS — G8220 Paraplegia, unspecified: Secondary | ICD-10-CM

## 2013-04-14 LAB — GLUCOSE, CAPILLARY: Glucose-Capillary: 178 mg/dL — ABNORMAL HIGH (ref 70–99)

## 2013-04-14 MED ORDER — GABAPENTIN 400 MG PO CAPS
400.0000 mg | ORAL_CAPSULE | Freq: Three times a day (TID) | ORAL | Status: DC
  Filled 2013-04-14 (×3): qty 1

## 2013-04-14 MED ORDER — GABAPENTIN 400 MG PO CAPS
400.0000 mg | ORAL_CAPSULE | Freq: Three times a day (TID) | ORAL | Status: DC
  Administered 2013-04-14 – 2013-04-18 (×14): 400 mg via ORAL
  Filled 2013-04-14 (×16): qty 1

## 2013-04-14 NOTE — Plan of Care (Signed)
Problem: RH PAIN MANAGEMENT Goal: RH STG PAIN MANAGED AT OR BELOW PT'S PAIN GOAL Less than 4 out of 10  Outcome: Not Progressing Patients pain 7-9 on pain scale.adm

## 2013-04-14 NOTE — Progress Notes (Signed)
Pt states pain better controlled today; less frequent requests for PRN's; reviewed with pt and wife options available without exceeding acetaminophen limits. Incision line at back with small open area at distal end,no drainage, no redness peri-wound; s/s applied, monitor.

## 2013-04-14 NOTE — Progress Notes (Signed)
  Radiation Oncology         [phone] ________________________________  Name: Andrew Nelson MRN: [id number]  Date: 04/14/2013  DOB: [birth year]  Weekly Radiation Therapy Management  Current Dose: 27 Gy     Planned Dose:  30 Gy to the lumbar spine  Narrative . . . . . . . . The patient presents for routine under treatment assessment.                                                     The patient is without complaint.                                 Set-up films were reviewed.                                 The chart was checked. Physical Findings. . . .  No significant changes. Impression . . . . . . . The patient is tolerating radiation. Plan . . . . . . . . . . . . Continue treatment as planned, and start radiotherapy to mets at T1 and T5 Monday for 10 fractions.  ________________________________  [name], M.D.

## 2013-04-14 NOTE — Progress Notes (Signed)
Physical Therapy Note  Patient Details  Name: Andrew Nelson MRN: [id number] Date of Birth: [birth year] Today's Date: 04/14/2013  Pt missed 60 mins skilled OT services secondary to increased pain. Pt stated he couldn't do anything until he received his pain medications.  He was waiting for nursing to get back with him when he could have more pain medications.   Lavone Neri [facility]berg [facility] 04/14/2013, 10:45 AM

## 2013-04-14 NOTE — Progress Notes (Signed)
Note reviewed and accurately reflects treatment session.   

## 2013-04-14 NOTE — Progress Notes (Signed)
Physical Therapy Note  Patient Details  Name: HENCE DERRICK MRN: [id number] Date of Birth: [birth year] Today's Date: 04/14/2013  9562-1308 (55 minutes) individual Pain : 9/10 RT shoulder/ meds given Focus of treatment: Standing tolerance; transfer training with wifes Treatment : Pt in bed upon arrival; supine to left side to sit with bedrail SBA with increased time ; wife assisted with setup for sliding board transfer; pt transferred from bed to wc with SBA; standing tolerance using standing frame X 15 minutes while sorting playing cards to suite. Now increased pain reported during this session.   1500-1525 (25 minutes) individual Pain: 8/10 right shoulder/chest/ premedicated Focus of treatment: Therapeutic exercise focused on bilateral LE strengthening Treatment: Transfers squat/pivot SBA with increased time; sit to side (left) to supine SBA; left sidt to sit SBA ; therapeutic exercise X 10 in supine - heel slides, hip abduction, SAQs with increased time.    Ivin Rosenbloom,JIM 04/14/2013, 9:56 AM

## 2013-04-14 NOTE — Progress Notes (Signed)
Subjective/Complaints: Feeling much better today. meds working for pain.  A 12 point review of systems has been performed and if not noted above is otherwise negative.    Objective: Vital Signs: Blood pressure 138/87, pulse 81, temperature 98.4 F (36.9 C), temperature source Oral, resp. rate 18, height 5\' 7"  (1.702 m), weight 77.1 kg (169 lb 15.6 oz), SpO2 96.00%. No results found. No results found for this basename: WBC, HGB, HCT, PLT,  in the last 72 hours No results found for this basename: NA, K, CL, CO, GLUCOSE, BUN, CREATININE, CALCIUM,  in the last 72 hours CBG (last 3)   Recent Labs  04/13/13 1702 04/13/13 2058 04/14/13 0744  GLUCAP 168* 209* 178*    Wt Readings from Last 3 Encounters:  04/05/13 77.1 kg (169 lb 15.6 oz)  04/07/13 76.658 kg (169 lb)  04/05/13 74.481 kg (164 lb 3.2 oz)    Physical Exam:    Constitutional: He is oriented to person, place, and time. Appears uncomfortable HENT: oral mucosa pink and moist  Head: Normocephalic.  Eyes: EOM are normal.  Neck: Normal range of motion. Neck supple. No thyromegaly present.  Cardiovascular: Normal rate and regular rhythm.  Pulmonary/Chest: Effort normal and breath sounds normal. No respiratory distress.  Abdominal: Soft. Bowel sounds are normal. He exhibits no distension.  Neurological: He is alert and oriented to person, place, and time.  Mid thoracic sensory level, below which he has 1+ sensation (senses pain stim but has difficulty discerning light touch and proprioception). UE strength 5/5 except for right HI which are 1+ currently, perhaps less.. RLE: HF2-, KE 2 to 2+, ADF and APF 3 to 3+. dtr's tr to 1+ . LLE is gross is grossly 3 to 3+/5 proximal to distal  Musc: vague pain along axilla with radiation down right medial arm Skin:  Back incision is clean and dry  Psychiatric: appears fatigued, a little disoriented, anxious   Assessment/Plan: 1. Functional deficits secondary to metastatic prostate  cancer to the T-spine/L-spine s/p thoracic decompression which require 3+ hours per day of interdisciplinary therapy in a comprehensive inpatient rehab setting. Physiatrist is providing close team supervision and 24 hour management of active medical problems listed below. Physiatrist and rehab team continue to assess barriers to discharge/monitor patient progress toward functional and medical goals.  Given the situation, I think it would be prudent to accelerate/adjust rehab plans to focus on adaptive equipment, family education, pain mgt and dc home sooner than later. I think we should be focusing on dc for next week perhaps. (scheduled for 9/18 currently)   FIM: FIM - Bathing Bathing Steps Patient Completed: Chest;Right Arm;Left Arm;Abdomen;Front perineal area;Right upper leg;Left upper leg Bathing: 3: Mod-Patient completes 5-7 40f 10 parts or 50-74%  FIM - Upper Body Dressing/Undressing Upper body dressing/undressing steps patient completed: Thread/unthread right sleeve of pullover shirt/dresss;Thread/unthread left sleeve of pullover shirt/dress;Put head through opening of pull over shirt/dress Upper body dressing/undressing: 4: Min-Patient completed 75 plus % of tasks FIM - Lower Body Dressing/Undressing Lower body dressing/undressing steps patient completed: Thread/unthread right pants leg;Thread/unthread right underwear leg;Thread/unthread left underwear leg Lower body dressing/undressing: 1: Total-Patient completed less than 25% of tasks  FIM - Toileting Toileting: 1: Total-Patient completed zero steps, helper did all 3  FIM - Diplomatic Services operational officer Devices: Grab bars Toilet Transfers: 4-To toilet/BSC: Min A (steadying Pt. > 75%);4-From toilet/BSC: Min A (steadying Pt. > 75%)  FIM - Banker Devices: Sliding board;Arm rests Bed/Chair Transfer: 3: Sit >  Supine: Mod A (lifting assist/Pt. 50-74%/lift 2 legs);5: Chair or W/C >  Bed: Supervision (verbal cues/safety issues)  FIM - Locomotion: Wheelchair Distance: 150' Locomotion: Wheelchair: 1: Total Assistance/staff pushes wheelchair (Pt<25%) FIM - Locomotion: Ambulation Locomotion: Ambulation Assistive Devices: Designer, industrial/product Ambulation/Gait Assistance: 3: Mod assist;4: Min assist Locomotion: Ambulation: 1: Travels less than 50 ft with moderate assistance (Pt: 50 - 74%)  Comprehension Comprehension Mode: Auditory Comprehension: 7-Follows complex conversation/direction: With no assist  Expression Expression Mode: Verbal Expression: 6-Expresses complex ideas: With extra time/assistive device  Social Interaction Social Interaction: 5-Interacts appropriately 90% of the time - Needs monitoring or encouragement for participation or interaction.  Problem Solving Problem Solving: 5-Solves basic 90% of the time/requires cueing < 10% of the time  Memory Memory: 5-Recognizes or recalls 90% of the time/requires cueing < 10% of the time  Medical Problem List and Plan:  1. 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 DVT Prophylaxis/Anticoagulation: Subcutaneous heparin. Monitor platelet counts any signs of bleeding. Check vascular studies on admit.  3. Pain Management: Duragesic patch 75 mcg, hydrocodone and robaxin as needed. Monitor with increased mobility  -adjust regimen as needed for comfort and to allow participation with therapies   -pain appears to be radicular, in right T1,T2 distribution with associated weakness in these areas. May have some C8 involvement also. MRI of the T-spine was notable for the following: Otherwise epidural/ extraosseous tumor in the thoracic spine is most  pronounced at the right T1 neural foramen/costovertebral junction,  the posterior T1 epidural space (series 8, image 3), right T2 neural  foramen, and left T11 pedicle/neural foramen. He also has diffuse mets to ribs  which are playing a factor Compression -  -increased decadron to 8mg   -increase fentanyl patch to  -added gabapentin 300mg  tid  -added prn dilaudid and iv mso4  -pain improved   4. Neuropsych: This patient is capable of making decisions on his own behalf.  5. Acute blood loss anemia. Fe supp. Serial hgb's. Recheck today. 6. Hypertension. Cardura 8 mg each bedtime, hydrochlorothiazide 25 mg daily, lisinopril 10 mg daily.  7. Diabetes mellitus with peripheral neuropathy. Hemoglobin A1c is 6. Monitor closely while on Decadron therapy. NovoLog Mix 70/30 20 units daily. Fair control at present. SSI covg  8. Oncology. XRT is underway for adjunctive treatment of his L1,L3, and L5 lesions. XRT will be after therapies each day.  -decadron increased -XRT performing radiation to thoracic spine now for palliation. 10 treatments? -[name] has discussed prognosis, situation with pt as well today.  -9. FEN:  -encourage PO  LOS (Days) 9 A FACE TO FACE EVALUATION WAS PERFORMED  Maida Widger T 04/14/2013 8:18 AM

## 2013-04-14 NOTE — Progress Notes (Signed)
Social Work Patient ID: Andrew Nelson, male   DOB: 1951/05/31, 62 y.o.   MRN: [id number]   Met with patient and wife earlier today to discuss d/c plans.  Both aware that originally targeted d/c date is 9/18.  Explained that there is now discussion about whether we could consider a shorter stay with focus on family education and transfers in order to get pt home sooner for better quality of life.  Both expressing concern about difficulty of physically getting back and forth to [facility] after d/c.  Both uncertain of how many more rad txs will be needed.  Agreed that we need to confirm remaining number of txs if possible and discuss transport issues.  Will speak with MD about concerns and hope to have better plan by beginning of the week.  Breylon Sherrow, LCSW

## 2013-04-14 NOTE — Progress Notes (Signed)
Occupational Therapy Weekly Progress Note  Patient Details  Name: Andrew Nelson MRN: [id number] Date of Birth: 1951-04-20  Today's Date: 04/14/2013  Patient has met 3 of 4 short term goals.  Pt's progress with BADLs has been inconsistent this past week.  Pt has demonstrated increased independence with bathing, dressing, and toilet transfers but requires extra time with multiple rest breaks to complete all tasks.  Pt continues to experience increased pain in RUE which limits patient's ability at times to perform tasks.  Pt exhibits decreased grasp with Rt hand but has incorporated compensatory techniques to enable him to complete bathing and dressing tasks.  Pt's wife has been present but has not actively participated in BADLs.  Therapy staff has recommended a sliding board and hoyer life to assist with transfers at such time that patient is unable to assist with transfers.  A drop arm BSC has also been recommended.  Pt has been participatory when not limited by pain.  Patient continues to demonstrate the following deficits:decreased RUE , decreased endurance, decreased right hand grasp, decreased standing balance. and therefore will continue to benefit from skilled OT intervention to enhance overall performance with BADL.  Patient not progressing toward long term goals.  See goal revision..  Plan of care revisions: down graded goals to minimal assist.  OT Short Term Goals Week 1:  OT Short Term Goal 1 (Week 1): Pt to be at Min A level for scoot/SB txfr from w/c to drop arm commode OT Short Term Goal 1 - Progress (Week 1): Met OT Short Term Goal 2 (Week 1): Pt to perform LB dressing bed level w/min A OT Short Term Goal 2 - Progress (Week 1): Progressing toward goal OT Short Term Goal 3 (Week 1): Pt to be (I) after s/u for UB ADLs from w/c or EOB level OT Short Term Goal 3 - Progress (Week 1): Met OT Short Term Goal 4 (Week 1): Pt to propel w/c x100 ft w/Supervision and no more than 3 rest  breaks OT Short Term Goal 4 - Progress (Week 1): Met Week 2:  OT Short Term Goal 1 (Week 2): Pt will perform LB dressing with min A using AE PRN OT Short Term Goal 2 (Week 2): Pt will perform toileting tasks with min a OT Short Term Goal 3 (Week 2): Pt will direct caregivers independently for assistance with transfers and ADLs   Therapy Documentation Precautions:  Precautions Precautions: Fall;Back Precaution Comments: able to recall 2/3 back precautions Restrictions Weight Bearing Restrictions: No  See FIM for current functional status  Therapy/Group: Individual Therapy  Rich Brave 04/14/2013, 7:20 AM

## 2013-04-14 NOTE — Progress Notes (Signed)
Occupational Therapy Session Note  Patient Details  Name: Andrew Nelson MRN: [id number] Date of Birth: [birth year]  Today's Date: 04/14/2013 Time: 1345-1430 Time Calculation (min): 45 min  Short Term Goals: Week 1:  OT Short Term Goal 1 (Week 1): Pt to be at Min A level for scoot/SB txfr from w/c to drop arm commode OT Short Term Goal 1 - Progress (Week 1): Met OT Short Term Goal 2 (Week 1): Pt to perform LB dressing bed level w/min A OT Short Term Goal 2 - Progress (Week 1): Progressing toward goal OT Short Term Goal 3 (Week 1): Pt to be (I) after s/u for UB ADLs from w/c or EOB level OT Short Term Goal 3 - Progress (Week 1): Met OT Short Term Goal 4 (Week 1): Pt to propel w/c x100 ft w/Supervision and no more than 3 rest breaks OT Short Term Goal 4 - Progress (Week 1): Met Week 2:  OT Short Term Goal 1 (Week 2): Pt will perform LB dressing with min A using AE PRN OT Short Term Goal 2 (Week 2): Pt will perform toileting tasks with min a OT Short Term Goal 3 (Week 2): Pt will direct caregivers independently for assistance with transfers and ADLs  Skilled Therapeutic Interventions/Progress Updates:    pt resting in bed upon arrival. Pt went from supine > sit with supervision. Pt went from RW to w/c with steady A. Pt was able to propel self in w/c to sink and wash his hands. Educated patient on use of therapeutic tubing on wheels for grip support. Applied tubing to right wheel only as pt has decreased grip and strength in R hand. Pt rolled frontwards and backwards with tubing on wheels and stated it helped his grasp. When going from sit to supine pt reported pain of 10 in medial portion of chest. Focus of session, transfers (sit <> supine, bed to w/c), wheelchair mobility, and activity tolerance.  Therapy Documentation Precautions:  Precautions Precautions: Fall;Back Precaution Comments: able to recall 2/3 back precautions Restrictions Weight Bearing Restrictions: No   Pain: Pain  Assessment Pain Assessment: 0-10 Pain Score: 10-Worst pain ever Pain Type: Acute pain Pain Location: Chest Pain Orientation: Medial Pain Descriptors / Indicators: Aching Pain Onset: Other (Comment) (when sitting long periods of time and when laying down)  See FIM for current functional status  Therapy/Group: Individual Therapy  Olivia Mackie 04/14/2013, 3:01 PM

## 2013-04-15 ENCOUNTER — Inpatient Hospital Stay (HOSPITAL_COMMUNITY): Payer: BC Managed Care – PPO | Admitting: Occupational Therapy

## 2013-04-15 LAB — GLUCOSE, CAPILLARY: Glucose-Capillary: 187 mg/dL — ABNORMAL HIGH (ref 70–99)

## 2013-04-15 MED ORDER — INSULIN ASPART PROT & ASPART (70-30 MIX) 100 UNIT/ML ~~LOC~~ SUSP
24.0000 [IU] | Freq: Every day | SUBCUTANEOUS | Status: DC
  Administered 2013-04-15 – 2013-04-17 (×3): 24 [IU] via SUBCUTANEOUS

## 2013-04-15 MED ORDER — OXYCODONE HCL 5 MG PO TABS
15.0000 mg | ORAL_TABLET | ORAL | Status: DC | PRN
  Administered 2013-04-15 – 2013-04-17 (×8): 15 mg via ORAL
  Filled 2013-04-15 (×8): qty 3

## 2013-04-15 NOTE — Progress Notes (Signed)
Subjective/Complaints: Had a pretty good night. Pain is more in his chest now.   A 12 point review of systems has been performed and if not noted above is otherwise negative.    Objective: Vital Signs: Blood pressure 115/61, pulse 83, temperature 98 F (36.7 C), temperature source Oral, resp. rate 18, height 5\' 7"  (1.702 m), weight 77.1 kg (169 lb 15.6 oz), SpO2 96.00%. No results found. No results found for this basename: WBC, HGB, HCT, PLT,  in the last 72 hours No results found for this basename: NA, K, CL, CO, GLUCOSE, BUN, CREATININE, CALCIUM,  in the last 72 hours CBG (last 3)   Recent Labs  04/14/13 1641 04/14/13 2113 04/15/13 0718  GLUCAP 203* 183* 187*    Wt Readings from Last 3 Encounters:  04/05/13 77.1 kg (169 lb 15.6 oz)  04/07/13 76.658 kg (169 lb)  04/05/13 74.481 kg (164 lb 3.2 oz)    Physical Exam:    Constitutional: He is oriented to person, place, and time. Appears uncomfortable HENT: oral mucosa pink and moist  Head: Normocephalic.  Eyes: EOM are normal.  Neck: Normal range of motion. Neck supple. No thyromegaly present.  Cardiovascular: Normal rate and regular rhythm.  Pulmonary/Chest: Effort normal and breath sounds normal. No respiratory distress.  Abdominal: Soft. Bowel sounds are normal. He exhibits no distension.  Neurological: He is alert and oriented to person, place, and time.  Mid thoracic sensory level, below which he has 1+ sensation (senses pain stim but has difficulty discerning light touch and proprioception). UE strength 5/5 except for right HI which are 1+ currently, perhaps less.. RLE: HF2-, KE 2 to 2+, ADF and APF 3 to 3+. dtr's tr to 1+ . LLE is gross is grossly 3 to 3+/5 proximal to distal  Musc: vague pain along axilla with radiation down right medial arm Skin:  Back incision is clean and dry  Psychiatric: appears fatigued, a little disoriented, anxious   Assessment/Plan: 1. Functional deficits secondary to metastatic prostate  cancer to the T-spine/L-spine s/p thoracic decompression which require 3+ hours per day of interdisciplinary therapy in a comprehensive inpatient rehab setting. Physiatrist is providing close team supervision and 24 hour management of active medical problems listed below. Physiatrist and rehab team continue to assess barriers to discharge/monitor patient progress toward functional and medical goals.     FIM: FIM - Bathing Bathing Steps Patient Completed: Chest;Right Arm;Left Arm;Abdomen;Front perineal area;Right upper leg;Left upper leg Bathing: 3: Mod-Patient completes 5-7 60f 10 parts or 50-74%  FIM - Upper Body Dressing/Undressing Upper body dressing/undressing steps patient completed: Thread/unthread right sleeve of pullover shirt/dresss;Thread/unthread left sleeve of pullover shirt/dress;Put head through opening of pull over shirt/dress Upper body dressing/undressing: 4: Min-Patient completed 75 plus % of tasks FIM - Lower Body Dressing/Undressing Lower body dressing/undressing steps patient completed: Thread/unthread right pants leg;Thread/unthread right underwear leg;Thread/unthread left underwear leg Lower body dressing/undressing: 1: Total-Patient completed less than 25% of tasks  FIM - Toileting Toileting: 1: Total-Patient completed zero steps, helper did all 3  FIM - Diplomatic Services operational officer Devices: Grab bars Toilet Transfers: 4-To toilet/BSC: Min A (steadying Pt. > 75%);4-From toilet/BSC: Min A (steadying Pt. > 75%)  FIM - Bed/Chair Transfer Bed/Chair Transfer Assistive Devices: Therapist, occupational: 4: Bed > Chair or W/C: Min A (steadying Pt. > 75%);5: Supine > Sit: Supervision (verbal cues/safety issues);2: Sit > Supine: Max A (lifting assist/Pt. 25-49%)  FIM - Locomotion: Wheelchair Distance: 150' Locomotion: Wheelchair: 1: Total Assistance/staff pushes wheelchair (Pt<25%) FIM -  Locomotion: Ambulation Locomotion: Ambulation Assistive Devices:  Designer, industrial/product Ambulation/Gait Assistance: 3: Mod assist;4: Min assist Locomotion: Ambulation: 1: Travels less than 50 ft with moderate assistance (Pt: 50 - 74%)  Comprehension Comprehension Mode: Auditory Comprehension: 7-Follows complex conversation/direction: With no assist  Expression Expression Mode: Verbal Expression: 6-Expresses complex ideas: With extra time/assistive device  Social Interaction Social Interaction: 5-Interacts appropriately 90% of the time - Needs monitoring or encouragement for participation or interaction.  Problem Solving Problem Solving: 5-Solves complex 90% of the time/cues < 10% of the time  Memory Memory: 5-Recognizes or recalls 90% of the time/requires cueing < 10% of the time  Medical Problem List and Plan:  1. 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 DVT Prophylaxis/Anticoagulation: Subcutaneous heparin. Monitor platelet counts any signs of bleeding. Check vascular studies on admit.  3. Pain Management: Duragesic patch 75 mcg, hydrocodone and robaxin as needed. Monitor with increased mobility  -adjust regimen as needed for comfort and to allow participation with therapies   -pain appears to be radicular, in right T1,T2 distribution with associated weakness in these areas. May have some C8 involvement also. MRI of the T-spine was notable for the following: Otherwise epidural/ extraosseous tumor in the thoracic spine is most  pronounced at the right T1 neural foramen/costovertebral junction,  the posterior T1 epidural space (series 8, image 3), right T2 neural  foramen, and left T11 pedicle/neural foramen. 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 Neuropsych: This patient is capable of making decisions on his own behalf.  5. Acute blood loss anemia. Fe supp. Serial hgb's  6. Hypertension. Cardura 8 mg each bedtime, hydrochlorothiazide 25 mg daily, lisinopril 10 mg daily.  7. Diabetes mellitus with peripheral neuropathy. Hemoglobin A1c is 6. Monitor closely while on Decadron therapy. NovoLog Mix 70/30 20 units daily---increase to 24u 8. Oncology. XRT is underway for adjunctive treatment of his L1,L3, and L5 lesions. XRT will be after therapies each day.  -decadron increased -XRT performing radiation to thoracic spine now for palliation. 10 treatments? -[name] has discussed prognosis, situation with pt as well today.  -9. FEN:  -encourage PO  LOS (Days) 10 A FACE TO FACE EVALUATION WAS PERFORMED  Suhana Wilner T 04/15/2013 9:00 AM

## 2013-04-15 NOTE — Progress Notes (Signed)
Occupational Therapy Session Note  Patient Details  Name: Andrew Nelson MRN: [id number] Date of Birth: [birth year]  Today's Date: 04/15/2013 Time: 1100-1210 Time Calculation (min): 70 min  Short Term Goals: Week 2:  OT Short Term Goal 1 (Week 2): Pt will perform LB dressing with min A using AE PRN OT Short Term Goal 2 (Week 2): Pt will perform toileting tasks with min a OT Short Term Goal 3 (Week 2): Pt will direct caregivers independently for assistance with transfers and ADLs  Skilled Therapeutic Interventions/Progress Updates:  Patient resting in bed upon arrival.  Engaged in self care retraining to include sponge bath at sink and dress.  Focused session on activity tolerance, adhering to back precautions during transitional movements, functional mobility and with BADL tasks, sit><stand, standing tolerance, standing balance, increased use of RUE.  Patient occasionally is unable to find a comfortable position with breaking his back precautions (bending).  Patient required assist to pull up his pants while he maintained standing balance. Patient used LH sponge to improve independence with LB bathing.  Patient with all items in reach.   Therapy Documentation Precautions:  Precautions Precautions: Fall;Back Precaution Comments: able to recall 2/3 back precautions Restrictions Weight Bearing Restrictions: No Pain: 6/10-8/10, premedicated, rest and repositioned ADL: See FIM for current functional status  Therapy/Group: Individual Therapy  Tahlia Deamer 04/15/2013, 3:20 PM

## 2013-04-16 ENCOUNTER — Inpatient Hospital Stay (HOSPITAL_COMMUNITY): Payer: BC Managed Care – PPO | Admitting: Occupational Therapy

## 2013-04-16 ENCOUNTER — Inpatient Hospital Stay (HOSPITAL_COMMUNITY): Payer: BC Managed Care – PPO | Admitting: Physical Therapy

## 2013-04-16 LAB — GLUCOSE, CAPILLARY
Glucose-Capillary: 192 mg/dL — ABNORMAL HIGH (ref 70–99)
Glucose-Capillary: 202 mg/dL — ABNORMAL HIGH (ref 70–99)
Glucose-Capillary: 115 mg/dL — ABNORMAL HIGH (ref 70–99)

## 2013-04-16 NOTE — Progress Notes (Signed)
Occupational Therapy Session Note  Patient Details  Name: Andrew Nelson MRN: [id number] Date of Birth: 1951/01/25  Today's Date: 04/16/2013 Time: 4098-1191 Time Calculation (min): 66 min  Skilled Therapeutic Interventions/Progress Updates: Patient resting in bed upon approach and concurred to get OOB to bathe and dress at the sink.  Patient required extra time due to what he c/o pain in his upper thoracic area that radiated to his right back shoulder and then down his R arm and into his fingers.  As well, she c/o numbness on his right hand and somewhat in his left hand.   Patient was reluctant to participate, given c/os of pain and fatigue.  Today's focus was overall participation and sit to stand and standing at sink to complete pericleansing and pullling up pants.     Therapy Documentation Precautions:  Precautions Precautions: Fall;Back Precaution Comments: able to recall 2/3 back precautions Restrictions Weight Bearing Restrictions: No  Pain: 5/10 constant in chest, right shoulder arms and hand    See FIM for current functional status  Therapy/Group: Individual Therapy  Rozelle Logan 04/16/2013, 5:22 PM

## 2013-04-16 NOTE — Progress Notes (Signed)
Subjective/Complaints: Feeling fairly well this am. Pain remains more in chest still rather than right arm. Right hand still weak.   A 12 point review of systems has been performed and if not noted above is otherwise negative.    Objective: Vital Signs: Blood pressure 107/67, pulse 78, temperature 98.2 F (36.8 C), temperature source Oral, resp. rate 18, height 5\' 7"  (1.702 m), weight 77.1 kg (169 lb 15.6 oz), SpO2 98.00%. No results found. No results found for this basename: WBC, HGB, HCT, PLT,  in the last 72 hours No results found for this basename: NA, K, CL, CO, GLUCOSE, BUN, CREATININE, CALCIUM,  in the last 72 hours CBG (last 3)   Recent Labs  04/15/13 1717 04/15/13 2026 04/16/13 0738  GLUCAP 224* 159* 192*    Wt Readings from Last 3 Encounters:  04/05/13 77.1 kg (169 lb 15.6 oz)  04/07/13 76.658 kg (169 lb)  04/05/13 74.481 kg (164 lb 3.2 oz)    Physical Exam:    Constitutional: He is oriented to person, place, and time. Appears uncomfortable HENT: oral mucosa pink and moist  Head: Normocephalic.  Eyes: EOM are normal.  Neck: Normal range of motion. Neck supple. No thyromegaly present.  Cardiovascular: Normal rate and regular rhythm.  Pulmonary/Chest: Effort normal and breath sounds normal. No respiratory distress.  Abdominal: Soft. Bowel sounds are normal. He exhibits no distension.  Neurological: He is alert and oriented to person, place, and time.  Mid thoracic sensory level, below which he has 1+ sensation (senses pain stim but has difficulty discerning light touch and proprioception). UE strength 5/5 except for right HI which are 1+ currently, perhaps less.. RLE: HF2-, KE 2 to 2+, ADF and APF 3 to 3+. dtr's tr to 1+ . LLE is gross is grossly 3 to 3+/5 proximal to distal  Musc: vague pain along axilla with radiation down right medial arm Skin:  Back incision is clean and dry  Psychiatric: appears fatigued, a little disoriented, anxious   Assessment/Plan: 1.  Functional deficits secondary to metastatic prostate cancer to the T-spine/L-spine s/p thoracic decompression which require 3+ hours per day of interdisciplinary therapy in a comprehensive inpatient rehab setting. Physiatrist is providing close team supervision and 24 hour management of active medical problems listed below. Physiatrist and rehab team continue to assess barriers to discharge/monitor patient progress toward functional and medical goals.     FIM: FIM - Bathing Bathing Steps Patient Completed: Chest;Right Arm;Left Arm;Abdomen;Front perineal area;Right upper leg;Left upper leg Bathing: 3: Mod-Patient completes 5-7 57f 10 parts or 50-74%  FIM - Upper Body Dressing/Undressing Upper body dressing/undressing steps patient completed: Thread/unthread right sleeve of pullover shirt/dresss;Thread/unthread left sleeve of pullover shirt/dress;Put head through opening of pull over shirt/dress Upper body dressing/undressing: 4: Min-Patient completed 75 plus % of tasks FIM - Lower Body Dressing/Undressing Lower body dressing/undressing steps patient completed: Thread/unthread right pants leg;Thread/unthread right underwear leg;Thread/unthread left underwear leg Lower body dressing/undressing: 1: Total-Patient completed less than 25% of tasks  FIM - Toileting Toileting: 1: Total-Patient completed zero steps, helper did all 3  FIM - Diplomatic Services operational officer Devices: Grab bars Toilet Transfers: 4-To toilet/BSC: Min A (steadying Pt. > 75%);4-From toilet/BSC: Min A (steadying Pt. > 75%)  FIM - Bed/Chair Transfer Bed/Chair Transfer Assistive Devices: Arm rests Bed/Chair Transfer: 5: Supine > Sit: Supervision (verbal cues/safety issues);4: Bed > Chair or W/C: Min A (steadying Pt. > 75%)  FIM - Locomotion: Wheelchair Distance: 150' Locomotion: Wheelchair: 1: Total Assistance/staff pushes wheelchair (Pt<25%) FIM -  Locomotion: Ambulation Locomotion: Ambulation Assistive  Devices: Designer, industrial/product Ambulation/Gait Assistance: 3: Mod assist;4: Min assist Locomotion: Ambulation: 1: Travels less than 50 ft with moderate assistance (Pt: 50 - 74%)  Comprehension Comprehension Mode: Auditory Comprehension: 7-Follows complex conversation/direction: With no assist  Expression Expression Mode: Verbal Expression: 6-Expresses complex ideas: With extra time/assistive device  Social Interaction Social Interaction: 5-Interacts appropriately 90% of the time - Needs monitoring or encouragement for participation or interaction.  Problem Solving Problem Solving: 5-Solves complex 90% of the time/cues < 10% of the time  Memory Memory: 5-Recognizes or recalls 90% of the time/requires cueing < 10% of the time  Medical Problem List and Plan:  1. 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 DVT Prophylaxis/Anticoagulation: Subcutaneous heparin. Monitor platelet counts any signs of bleeding. Check vascular studies on admit.  3. Pain Management: Duragesic patch 75 mcg, hydrocodone and robaxin as needed. Monitor with increased mobility  -adjust regimen as needed for comfort and to allow participation with therapies   -pain appears to be radicular, in right T1,T2 distribution with associated weakness in these areas. May have some C8 involvement also. MRI of the T-spine was notable for the following: Otherwise epidural/ extraosseous tumor in the thoracic spine is most  pronounced at the right T1 neural foramen/costovertebral junction,  the posterior T1 epidural space (series 8, image 3), right T2 neural  foramen, and left T11 pedicle/neural foramen. He also has diffuse mets to ribs which are playing a factor Compression -  -increased decadron to 8mg   -increased fentanyl patch to  -increased gabapentin to 400mg  tid  -continue prn dilaudid and iv mso4 but will try to move away from the IV mso4 before dc  -oxy  IR 15mg  has been effective also   4. Neuropsych: This patient is capable of making decisions on his own behalf.  5. Acute blood loss anemia. Fe supp. Serial hgb's  6. Hypertension. Cardura 8 mg each bedtime, hydrochlorothiazide 25 mg daily, lisinopril 10 mg daily.  7. Diabetes mellitus with peripheral neuropathy. Hemoglobin A1c is 6. Monitor closely while on Decadron therapy. NovoLog Mix 70/30 20 units daily---increased to 24u 8. Oncology. XRT is underway for adjunctive treatment of his L1,L3, and L5 lesions. XRT will be after therapies each day.  -decadron increased -XRT performing radiation to thoracic spine now for palliation. 10 treatments? -[name] has discussed prognosis, situation with pt as well today.  -9. FEN:  -encourage PO  LOS (Days) 11 A FACE TO FACE EVALUATION WAS PERFORMED  Michale Emmerich T 04/16/2013 8:32 AM

## 2013-04-16 NOTE — Plan of Care (Signed)
Problem: RH PAIN MANAGEMENT Goal: RH STG PAIN MANAGED AT OR BELOW PT'S PAIN GOAL Less than 4 out of 10  Outcome: Not Progressing Med adjustments ongoing. Pt states if his pain gets down to a 7, "that's good." Alternating oxy IR 15mg , dilaudid 4mg , and IV morphine 2mg  to manage pain along with fentanyl patch

## 2013-04-16 NOTE — Progress Notes (Signed)
Events in the last few days noted.  His pain is slightly better with pain medication. Is able to participate in therapy with difficulty at times. His is doing well with the radiation.    Exam:   Alert and oriented gentleman did not appear any distress. Vitals were stable. Heart is regular rate and rhythm. Lungs clear auscultation abdomen soft without any rebound or guarding. Extremities with no edema. He has significant weakness in his right upper arm.  Impression and plan: Advanced prostate cancer with multiple spinal involvement status post surgical intervention radiation therapy and currently receiving acute rehabilitation.   I agree with the current plan of pain management accelerated rehabilitation and hopefully discharge home next week.   I continued discussing with him treatment goals. He understands that his cancer is aggressive and continued to be difficult to treat. He still hopes to with radiation and rehab he can still has a reasonable quality of life. I am not sure how realistic that is, but would like an aggressive approach for the time being. We will likely needs more radiation to his upper thoracic spine.   Will continue to follow.

## 2013-04-17 ENCOUNTER — Inpatient Hospital Stay (HOSPITAL_COMMUNITY): Payer: BC Managed Care – PPO | Admitting: Physical Therapy

## 2013-04-17 ENCOUNTER — Inpatient Hospital Stay (HOSPITAL_COMMUNITY): Payer: BC Managed Care – PPO

## 2013-04-17 ENCOUNTER — Ambulatory Visit
Admit: 2013-04-17 | Discharge: 2013-04-17 | Disposition: A | Discharge status: Home / Self Care | Payer: BC Managed Care – PPO | Attending: Radiation Oncology | Admitting: Radiation Oncology

## 2013-04-17 ENCOUNTER — Encounter: Payer: Self-pay | Admitting: Radiation Oncology

## 2013-04-17 ENCOUNTER — Inpatient Hospital Stay (HOSPITAL_COMMUNITY): Payer: BC Managed Care – PPO | Admitting: Occupational Therapy

## 2013-04-17 DIAGNOSIS — C720 Malignant neoplasm of spinal cord: Secondary | ICD-10-CM

## 2013-04-17 DIAGNOSIS — C61 Malignant neoplasm of prostate: Secondary | ICD-10-CM

## 2013-04-17 DIAGNOSIS — G8220 Paraplegia, unspecified: Secondary | ICD-10-CM

## 2013-04-17 LAB — GLUCOSE, CAPILLARY: Glucose-Capillary: 148 mg/dL — ABNORMAL HIGH (ref 70–99)

## 2013-04-17 MED ORDER — FENTANYL 100 MCG/HR TD PT72
100.0000 ug | MEDICATED_PATCH | Freq: Once | TRANSDERMAL | Status: DC
  Administered 2013-04-17: 100 ug via TRANSDERMAL
  Filled 2013-04-17: qty 1

## 2013-04-17 MED ORDER — FENTANYL 25 MCG/HR TD PT72: 125.0000 ug | MEDICATED_PATCH | TRANSDERMAL | Status: DC

## 2013-04-17 MED ORDER — OXYCODONE HCL 5 MG PO TABS
15.0000 mg | ORAL_TABLET | ORAL | Status: DC | PRN
  Administered 2013-04-17: 30 mg via ORAL
  Administered 2013-04-17: 20 mg via ORAL
  Administered 2013-04-17: 15 mg via ORAL
  Administered 2013-04-18 (×2): 30 mg via ORAL
  Filled 2013-04-17 (×4): qty 6
  Filled 2013-04-17: qty 3
  Filled 2013-04-17: qty 6

## 2013-04-17 MED ORDER — FENTANYL 25 MCG/HR TD PT72
125.0000 ug | MEDICATED_PATCH | TRANSDERMAL | Status: DC
  Filled 2013-04-17: qty 1

## 2013-04-17 MED ORDER — FENTANYL 25 MCG/HR TD PT72
25.0000 ug | MEDICATED_PATCH | Freq: Once | TRANSDERMAL | Status: DC
  Administered 2013-04-17: 25 ug via TRANSDERMAL
  Filled 2013-04-17: qty 1

## 2013-04-17 NOTE — Progress Notes (Addendum)
Subjective/Complaints: Having pain in chest this morning. Struggling with therapies this am A 12 point review of systems has been performed and if not noted above is otherwise negative.    Objective: Vital Signs: Blood pressure 120/72, pulse 83, temperature 98.3 F (36.8 C), temperature source Oral, resp. rate 20, height 5\' 7"  (1.702 m), weight 77.1 kg (169 lb 15.6 oz), SpO2 99.00%. No results found. No results found for this basename: WBC, HGB, HCT, PLT,  in the last 72 hours No results found for this basename: NA, K, CL, CO, GLUCOSE, BUN, CREATININE, CALCIUM,  in the last 72 hours CBG (last 3)   Recent Labs  04/16/13 1202 04/16/13 1600 04/16/13 2147  GLUCAP 198* 202* 115*    Wt Readings from Last 3 Encounters:  04/05/13 77.1 kg (169 lb 15.6 oz)  04/07/13 76.658 kg (169 lb)  04/05/13 74.481 kg (164 lb 3.2 oz)    Physical Exam:    Constitutional: He is oriented to person, place, and time. Appears uncomfortable HENT: oral mucosa pink and moist  Head: Normocephalic.  Eyes: EOM are normal.  Neck: Normal range of motion. Neck supple. No thyromegaly present.  Cardiovascular: Normal rate and regular rhythm.  Pulmonary/Chest: Effort normal and breath sounds normal. No respiratory distress.  Abdominal: Soft. Bowel sounds are normal. He exhibits no distension.  Neurological: He is alert and oriented to person, place, and time.  Mid thoracic sensory level, below which he has 1+ sensation (senses pain stim but has difficulty discerning light touch and proprioception). UE strength 5/5 except for right HI which are tr to 1 currently.. RLE: HF2-, KE 2 to 2+, ADF and APF 3 to 3+. dtr's tr to 1+ . LLE is gross is grossly 3 to 3+/5 proximal to distal  Musc: continued pain over chest and right axilla. Skin:  Back incision is clean and dry  Psychiatric: appears fatigued, a little disoriented, anxious   Assessment/Plan: 1. Functional deficits secondary to metastatic prostate cancer to the  T-spine/L-spine s/p thoracic decompression which require 3+ hours per day of interdisciplinary therapy in a comprehensive inpatient rehab setting. Physiatrist is providing close team supervision and 24 hour management of active medical problems listed below. Physiatrist and rehab team continue to assess barriers to discharge/monitor patient progress toward functional and medical goals.  Again, we need to try to come to a decision regarding the plan of care. Pt continues to decline from a neurological and pain sense but is unwilling to "give up." I have spoken with Dr. Ladona Ridgel regarding a palliative care consult to assist [place] and the pt/wife in decision making. [name] is off today, and I could not reach the on call MD (did not return my page). Hopefully [name] will assist [place] with planning also.   For now, continue with pain mgt, family ed, activity to tolerance as we work on the above. I think the best location to manage this pt would be the oncology wing a [facility] given that the pt wants to continue with aggressive care and continues to undergo XRT.    FIM: FIM - Bathing Bathing Steps Patient Completed: Chest;Right Arm;Left Arm;Abdomen;Front perineal area;Right upper leg;Left upper leg Bathing: 3: Mod-Patient completes 5-7 20f 10 parts or 50-74%  FIM - Upper Body Dressing/Undressing Upper body dressing/undressing steps patient completed: Thread/unthread right sleeve of pullover shirt/dresss;Thread/unthread left sleeve of pullover shirt/dress;Put head through opening of pull over shirt/dress;Pull shirt over trunk Upper body dressing/undressing: 5: Set-up assist to: Obtain clothing/put away FIM - Lower Body  Dressing/Undressing Lower body dressing/undressing steps patient completed: Thread/unthread right pants leg;Thread/unthread left pants leg Lower body dressing/undressing: 1: Total-Patient completed less than 25% of tasks  FIM - Hotel manager Devices: Grab bar or rail for  support Toileting: 1: Two helpers  FIM - Diplomatic Services operational officer Devices: Therapist, music Transfers: 0-Activity did not occur  FIM - Banker Devices: Arm rests Bed/Chair Transfer: 4: Bed > Chair or W/C: Min A (steadying Pt. > 75%)  FIM - Locomotion: Wheelchair Distance: 150' Locomotion: Wheelchair: 1: Total Assistance/staff pushes wheelchair (Pt<25%) FIM - Locomotion: Ambulation Locomotion: Ambulation Assistive Devices: Designer, industrial/product Ambulation/Gait Assistance: 3: Mod assist;4: Min assist Locomotion: Ambulation: 1: Travels less than 50 ft with moderate assistance (Pt: 50 - 74%)  Comprehension Comprehension Mode: Auditory Comprehension: 7-Follows complex conversation/direction: With no assist  Expression Expression Mode: Verbal Expression: 6-Expresses complex ideas: With extra time/assistive device  Social Interaction Social Interaction: 5-Interacts appropriately 90% of the time - Needs monitoring or encouragement for participation or interaction.  Problem Solving Problem Solving: 5-Solves complex 90% of the time/cues < 10% of the time  Memory Memory: 5-Recognizes or recalls 90% of the time/requires cueing < 10% of the time  Medical Problem List and Plan:  1. 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 DVT Prophylaxis/Anticoagulation: Subcutaneous heparin. Monitor platelet counts any signs of bleeding. Check vascular studies on admit.  3. Pain Management: Duragesic patch 75 mcg, hydrocodone and robaxin as needed. Monitor with increased mobility  -adjust regimen as needed for comfort and to allow participation with therapies   -pain appears to be radicular, in right T1,T2 distribution with associated weakness in these areas. May have some C8 involvement also. MRI of the T-spine was notable for the following: Otherwise epidural/ extraosseous tumor in  the thoracic spine is most  pronounced at the right T1 neural foramen/costovertebral junction,  the posterior T1 epidural space (series 8, image 3), right T2 neural  foramen, and left T11 pedicle/neural foramen. He also has diffuse mets to ribs which are playing a factor Compression -  -increased decadron to 8mg   -increase fentanyl patch to 125 mcg  -  gabapentin to 400mg  tid  -continue prn dilaudid and iv mso4 but will try to move away from the IV mso4 before dc  -oxy IR 15mg -30mg  for breakthrough pain  4. Neuropsych: This patient is capable of making decisions on his own behalf.  5. Acute blood loss anemia. Fe supp. Serial hgb's  6. Hypertension. Cardura 8 mg each bedtime, hydrochlorothiazide 25 mg daily, lisinopril 10 mg daily.  7. Diabetes mellitus with peripheral neuropathy. Hemoglobin A1c is 6. Monitor closely while on Decadron therapy. NovoLog Mix 70/30 20 units daily---increased to 24u 8. Oncology. XRT is underway for adjunctive treatment of his L1,L3, and L5 lesions. XRT will be after therapies each day.  -decadron increased -XRT performing radiation to thoracic spine now for palliation. 10 treatments? -[name] has discussed prognosis, situation with pt as well---unfortunately he's not very realistic regarding pain and overall expectations -9. FEN:  -encourage PO  LOS (Days) 12 A FACE TO FACE EVALUATION WAS PERFORMED  Salayah Meares T 04/17/2013 7:41 AM

## 2013-04-17 NOTE — Progress Notes (Signed)
Occupational Therapy Session Note  Patient Details  Name: Andrew Nelson MRN: [id number] Date of Birth: 1950-11-28  Today's Date: 04/17/2013 Time: 0700-0800 Time Calculation (min): 60 min  Short Term Goals: Week 1:  OT Short Term Goal 1 (Week 1): Pt to be at Min A level for scoot/SB txfr from w/c to drop arm commode OT Short Term Goal 1 - Progress (Week 1): Met OT Short Term Goal 2 (Week 1): Pt to perform LB dressing bed level w/min A OT Short Term Goal 2 - Progress (Week 1): Progressing toward goal OT Short Term Goal 3 (Week 1): Pt to be (I) after s/u for UB ADLs from w/c or EOB level OT Short Term Goal 3 - Progress (Week 1): Met OT Short Term Goal 4 (Week 1): Pt to propel w/c x100 ft w/Supervision and no more than 3 rest breaks OT Short Term Goal 4 - Progress (Week 1): Met  Skilled Therapeutic Interventions/Progress Updates:    Pt resting in bed with wife at bedside.  Pt stated he wanted to try to get washed up and dressed while sitting in w/c at sink.  Pt performed supine->sit EOB with HOB elevated with supervision and extra time.  Pt performed scoot transfer to w/c with steady A and extra time.  Pt requested to use toilet after bathing UB and transferred to toilet with min A for scoot transfer.  Pt required assistance to complete LB bathing and dressing.  Pt requires extra time to complete all tasks with multiple rest breaks throughout.  Recommended to patient and wife that bathing and dressing be performed at bed level to conserve energy and minimize pain.  Wife and patient verbalized understanding and agreed to complete tasks at bed level tomorrow morning.    Therapy Documentation Precautions:  Precautions Precautions: Fall;Back Precaution Comments: able to recall 2/3 back precautions Restrictions Weight Bearing Restrictions: No Pain: Pain Assessment Pain Assessment: 0-10 Pain Score: 8  Pain Type: Acute pain Pain Location: Arm Pain Orientation: Right Pain Descriptors /  Indicators: Aching Pain Onset: On-going Patients Stated Pain Goal: 2 Pain Intervention(s): RN made aware;Repositioned  See FIM for current functional status  Therapy/Group: Individual Therapy  Rich Brave 04/17/2013, 8:09 AM

## 2013-04-17 NOTE — Plan of Care (Signed)
Problem: SCI BOWEL ELIMINATION Goal: RH STG MANAGE BOWEL WITH ASSISTANCE STG Manage Bowel with min assist  Outcome: Not Progressing Required assistance of 2 for toileting this shift due to fatigue.   Problem: RH PAIN MANAGEMENT Goal: RH STG PAIN MANAGED AT OR BELOW PT'S PAIN GOAL Less than 4 out of 10  Outcome: Not Progressing Continues to complain of pain frequently.  Pain medications administered.

## 2013-04-17 NOTE — Progress Notes (Addendum)
Physical Therapy Session Note  Patient Details  Name: NAHMIR ZEIDMAN MRN: [id number] Date of Birth: 01-17-1951  Today's Date: 04/17/2013 Time: 1425-1510 Time Calculation (min): 45 min  Short Term Goals: Week 2:  PT Short Term Goal 1 (Week 2): See LTGs  Skilled Therapeutic Interventions/Progress Updates:    Pt still reports pain is significant, RN in room working on pain medication.   Pt moves very slow with lots of rest breaks. Supine > sit with HOB elevated performed with min assist needed for Rt. LE. Cues for back precautions however pt has difficulty maintaining functionally and maintains very slouched posture in sitting. Sit <> stands x 3 reps for strengthening and balance, overall min assist but very difficult for pt. PT positioned for potential knee buckle secondary to weak LEs. Side stepping to [facility] with min assist and assist for RW, decreased control of Rt UE/LE. Returned to bed with min assist and significant time. Pt requires extra time for positioning to obtain comfortable position.   Pt reports during session "I just don't know what is happening" re weakness. PT provided emotional support.   Therapy Documentation Precautions:  Precautions Precautions: Fall;Back Precaution Comments: able to recall 2/3 back precautions Restrictions Weight Bearing Restrictions: No Pain: Pain Assessment Pain Score: 8  Pain Type: Acute pain Pain Location: Chest Pain Orientation: Anterior;Mid Pain Descriptors / Indicators: Aching ("traveling") Pain Onset: On-going Pain Intervention(s): RN made aware (provided medication)  See FIM for current functional status  Therapy/Group: Individual Therapy  Wilhemina Bonito 04/17/2013, 3:26 PM

## 2013-04-17 NOTE — Progress Notes (Signed)
Physical Therapy Session Note  Patient Details  Name: Andrew Nelson MRN: [id number] Date of Birth: [birth year]  Today's Date: 04/17/2013 Time: 0700-0800 Time Calculation (min): 60 min  Short Term Goals: Week 2:  PT Short Term Goal 1 (Week 2): See LTGs  Skilled Therapeutic Interventions/Progress Updates:    Pt reports more pain issues. Discussed home management issues going to/from radiation. Discussed ambulance vs. Transfers with wife (which pt would need a ramp, handout given). Pt had difficulty following conversation and was lethargic at times presumably from increases in pain medication. Wife not present.  Bed mobility and sliding board transfer with min assist/supervision. PT assisted with set up and removal of board, pt directing therapist. Pt moves very, very slowly due to pain and weakness. Pt declined ambulation or wheelchair propulsion due to pain. Able to participate in LE strengthening: Long arc quads and marching with bil. Ankle weights (1#) 2 x 10 reps each.  Goals downgraded again due to slow progress and prognosis.   Therapy Documentation Precautions:  Precautions Precautions: Fall;Back Precaution Comments: able to recall 2/3 back precautions Restrictions Weight Bearing Restrictions: No Pain: Pain Assessment Pain Assessment: 0-10 Pain Score: 8  Pain Type: Acute pain Pain Location: Chest Pain Orientation: Right Pain Descriptors / Indicators: Aching Pain Onset: On-going Patients Stated Pain Goal: 2 Pain Intervention(s): RN made aware;Repositioned  See FIM for current functional status  Therapy/Group: Individual Therapy  Wilhemina Bonito 04/17/2013, 8:47 AM

## 2013-04-17 NOTE — Progress Notes (Addendum)
Occupational Therapy Session Note  Patient Details  Name: Andrew Nelson MRN: [id number] Date of Birth: [birth year]  Today's Date: 04/17/2013 Time: 0930-0950 Time Calculation (min): 20 min (missed 10 min due to 9/10 chest pain)  Short Term Goals: Week 2:  OT Short Term Goal 1 (Week 2): Pt will perform LB dressing with min A using AE PRN OT Short Term Goal 2 (Week 2): Pt will perform toileting tasks with min a OT Short Term Goal 3 (Week 2): Pt will direct caregivers independently for assistance with transfers and ADLs  Skilled Therapeutic Interventions/Progress Updates:  Patient resting in w/c having just completed PT session.  Patient requesting back to bed due to 9/10 chest pain.. Engaged in slide board transfers traveling to his left and slightly uphill into the bed.  Patient required mod-max assist and multiple rest breaks due to pain with transfer and bed mobility.  Patient resting in bed with all items in reach.  Therapy Documentation Precautions:  Precautions Precautions: Fall;Back Restrictions Weight Bearing Restrictions: No Pain: Pain Assessment Pain Assessment: 0-10 Pain Score: 9  Pain Type: Acute pain Pain Location: Chest Pain Orientation: Mid;Right Pain Descriptors / Indicators: Aching;Constant Pain Onset: On-going Patients Stated Pain Goal: 2 Pain Intervention(s): RN made aware;Environmental changes;Rest;Repositioned  Therapy/Group: Individual Therapy  Jerric Oyen 04/17/2013, 9:57 AM

## 2013-04-17 NOTE — Progress Notes (Signed)
Thank you for consulting the Palliative Medicine Team at [facility] to meet your patient's and family's needs.   The reason that you asked [place] to see your patient is for goals of care discussion  We have scheduled your patient for a meeting: tomorrow, Tuesday 04/18/13 @ 1:00 pm per wife request  The Surrogate decision maker is: wife Zayyan Mullen c: [phone]  Other family members that need to be present: none per patient  Your patient is able to participate: on this visit patient alert- stated he has periods of forgetfulness and wants his wife to decide on meeting time  Additional Narrative: On writer's visit transport arrived to take pt to [facility] for Radiation Treatment - per staff pt will return approximately 1 pm- however pt's wife concerned about schedule of PT for patient this afternoon and requests meeting for tomorrow after patient returns from RT and staff can be aware of need to allow time for PMT meeting so this will not interfere with PT - staff RN Nita Sells aware of wife's concern and request for PMT meeting tomorrow   Valente David, RN, MSN 04/17/2013, 11:48 AM Palliative Medicine Team RN Liaison [phone]

## 2013-04-18 ENCOUNTER — Inpatient Hospital Stay (HOSPITAL_COMMUNITY): Payer: BC Managed Care – PPO

## 2013-04-18 ENCOUNTER — Inpatient Hospital Stay (HOSPITAL_COMMUNITY): Payer: BC Managed Care – PPO | Admitting: Physical Therapy

## 2013-04-18 ENCOUNTER — Inpatient Hospital Stay (HOSPITAL_COMMUNITY): Payer: BC Managed Care – PPO | Admitting: Occupational Therapy

## 2013-04-18 ENCOUNTER — Ambulatory Visit
Admit: 2013-04-18 | Discharge: 2013-04-18 | Disposition: A | Discharge status: Home / Self Care | Payer: BC Managed Care – PPO | Attending: Radiation Oncology | Admitting: Radiation Oncology

## 2013-04-18 ENCOUNTER — Encounter (HOSPITAL_COMMUNITY): Payer: Self-pay

## 2013-04-18 ENCOUNTER — Inpatient Hospital Stay (HOSPITAL_COMMUNITY)
Admission: AD | Admit: 2013-04-18 | Discharge: 2013-04-28 | DRG: 130 | Disposition: A | Discharge status: Hospice | Payer: BC Managed Care – PPO | Source: Ambulatory Visit | Attending: Internal Medicine | Admitting: Internal Medicine

## 2013-04-18 DIAGNOSIS — E440 Moderate protein-calorie malnutrition: Secondary | ICD-10-CM | POA: Diagnosis present

## 2013-04-18 DIAGNOSIS — D61818 Other pancytopenia: Secondary | ICD-10-CM | POA: Diagnosis present

## 2013-04-18 DIAGNOSIS — Z87891 Personal history of nicotine dependence: Secondary | ICD-10-CM

## 2013-04-18 DIAGNOSIS — R627 Adult failure to thrive: Secondary | ICD-10-CM

## 2013-04-18 DIAGNOSIS — IMO0002 Reserved for concepts with insufficient information to code with codable children: Secondary | ICD-10-CM

## 2013-04-18 DIAGNOSIS — C7949 Secondary malignant neoplasm of other parts of nervous system: Secondary | ICD-10-CM

## 2013-04-18 DIAGNOSIS — Z66 Do not resuscitate: Secondary | ICD-10-CM | POA: Diagnosis present

## 2013-04-18 DIAGNOSIS — E109 Type 1 diabetes mellitus without complications: Secondary | ICD-10-CM | POA: Diagnosis present

## 2013-04-18 DIAGNOSIS — E876 Hypokalemia: Secondary | ICD-10-CM | POA: Diagnosis present

## 2013-04-18 DIAGNOSIS — M87051 Idiopathic aseptic necrosis of right femur: Secondary | ICD-10-CM

## 2013-04-18 DIAGNOSIS — C7951 Secondary malignant neoplasm of bone: Secondary | ICD-10-CM | POA: Diagnosis present

## 2013-04-18 DIAGNOSIS — Z880 Allergy status to penicillin: Secondary | ICD-10-CM

## 2013-04-18 DIAGNOSIS — C50919 Malignant neoplasm of unspecified site of unspecified female breast: Secondary | ICD-10-CM

## 2013-04-18 DIAGNOSIS — Z515 Encounter for palliative care: Secondary | ICD-10-CM

## 2013-04-18 DIAGNOSIS — I2510 Atherosclerotic heart disease of native coronary artery without angina pectoris: Secondary | ICD-10-CM | POA: Diagnosis present

## 2013-04-18 DIAGNOSIS — Z79899 Other long term (current) drug therapy: Secondary | ICD-10-CM

## 2013-04-18 DIAGNOSIS — E871 Hypo-osmolality and hyponatremia: Secondary | ICD-10-CM

## 2013-04-18 DIAGNOSIS — F411 Generalized anxiety disorder: Secondary | ICD-10-CM | POA: Diagnosis not present

## 2013-04-18 DIAGNOSIS — F17200 Nicotine dependence, unspecified, uncomplicated: Secondary | ICD-10-CM

## 2013-04-18 DIAGNOSIS — Z923 Personal history of irradiation: Secondary | ICD-10-CM

## 2013-04-18 DIAGNOSIS — I82401 Acute embolism and thrombosis of unspecified deep veins of right lower extremity: Secondary | ICD-10-CM

## 2013-04-18 DIAGNOSIS — Z794 Long term (current) use of insulin: Secondary | ICD-10-CM

## 2013-04-18 DIAGNOSIS — R0989 Other specified symptoms and signs involving the circulatory and respiratory systems: Secondary | ICD-10-CM | POA: Diagnosis not present

## 2013-04-18 DIAGNOSIS — C801 Malignant (primary) neoplasm, unspecified: Secondary | ICD-10-CM

## 2013-04-18 DIAGNOSIS — R0789 Other chest pain: Secondary | ICD-10-CM

## 2013-04-18 DIAGNOSIS — Z8546 Personal history of malignant neoplasm of prostate: Secondary | ICD-10-CM

## 2013-04-18 DIAGNOSIS — R531 Weakness: Secondary | ICD-10-CM

## 2013-04-18 DIAGNOSIS — I82409 Acute embolism and thrombosis of unspecified deep veins of unspecified lower extremity: Secondary | ICD-10-CM | POA: Diagnosis present

## 2013-04-18 DIAGNOSIS — Z9221 Personal history of antineoplastic chemotherapy: Secondary | ICD-10-CM

## 2013-04-18 DIAGNOSIS — D638 Anemia in other chronic diseases classified elsewhere: Secondary | ICD-10-CM | POA: Diagnosis present

## 2013-04-18 DIAGNOSIS — I10 Essential (primary) hypertension: Secondary | ICD-10-CM | POA: Diagnosis present

## 2013-04-18 DIAGNOSIS — C799 Secondary malignant neoplasm of unspecified site: Secondary | ICD-10-CM

## 2013-04-18 DIAGNOSIS — D6182 Myelophthisis: Secondary | ICD-10-CM | POA: Diagnosis present

## 2013-04-18 DIAGNOSIS — M79609 Pain in unspecified limb: Secondary | ICD-10-CM | POA: Diagnosis present

## 2013-04-18 DIAGNOSIS — C61 Malignant neoplasm of prostate: Secondary | ICD-10-CM | POA: Diagnosis present

## 2013-04-18 DIAGNOSIS — I82629 Acute embolism and thrombosis of deep veins of unspecified upper extremity: Principal | ICD-10-CM | POA: Diagnosis present

## 2013-04-18 DIAGNOSIS — R079 Chest pain, unspecified: Secondary | ICD-10-CM | POA: Diagnosis present

## 2013-04-18 DIAGNOSIS — E236 Other disorders of pituitary gland: Secondary | ICD-10-CM | POA: Diagnosis present

## 2013-04-18 DIAGNOSIS — R29898 Other symptoms and signs involving the musculoskeletal system: Secondary | ICD-10-CM

## 2013-04-18 DIAGNOSIS — R7989 Other specified abnormal findings of blood chemistry: Secondary | ICD-10-CM | POA: Diagnosis present

## 2013-04-18 LAB — CBC
MCH: 27.1 pg (ref 26.0–34.0)
MCV: 77.9 fL — ABNORMAL LOW (ref 78.0–100.0)
Platelets: 49 10*3/uL — ABNORMAL LOW (ref 150–400)
RBC: 3.17 MIL/uL — ABNORMAL LOW (ref 4.22–5.81)
RDW: 18.3 % — ABNORMAL HIGH (ref 11.5–15.5)

## 2013-04-18 LAB — COMPREHENSIVE METABOLIC PANEL
Alkaline Phosphatase: 87 U/L (ref 39–117)
BUN: 29 mg/dL — ABNORMAL HIGH (ref 6–23)
CO2: 20 mEq/L (ref 19–32)
GFR calc Af Amer: >90 mL/min (ref >90)
GFR calc non Af Amer: >90 mL/min (ref >90)
Glucose, Bld: 225 mg/dL — ABNORMAL HIGH (ref 70–99)
Potassium: 3.6 mEq/L (ref 3.5–5.1)
Total Bilirubin: 0.5 mg/dL (ref 0.3–1.2)
Total Protein: 4.8 g/dL — ABNORMAL LOW (ref 6.0–8.3)

## 2013-04-18 LAB — GLUCOSE, CAPILLARY: Glucose-Capillary: 176 mg/dL — ABNORMAL HIGH (ref 70–99)

## 2013-04-18 MED ORDER — HEPARIN SODIUM (PORCINE) 5000 UNIT/ML IJ SOLN
5000.0000 [IU] | Freq: Three times a day (TID) | INTRAMUSCULAR | Status: DC
  Administered 2013-04-18: 5000 [IU] via SUBCUTANEOUS
  Filled 2013-04-18 (×2): qty 1

## 2013-04-18 MED ORDER — METFORMIN HCL 850 MG PO TABS
850.0000 mg | ORAL_TABLET | Freq: Every day | ORAL | Status: DC
  Filled 2013-04-18: qty 1

## 2013-04-18 MED ORDER — DIAZEPAM 5 MG PO TABS
5.0000 mg | ORAL_TABLET | Freq: Two times a day (BID) | ORAL | Status: DC | PRN
  Administered 2013-04-20: 10 mg via ORAL
  Filled 2013-04-18 (×2): qty 2

## 2013-04-18 MED ORDER — OXYCODONE HCL 5 MG PO TABS
15.0000 mg | ORAL_TABLET | ORAL | Status: DC | PRN
  Administered 2013-04-19 – 2013-04-20 (×2): 15 mg via ORAL
  Administered 2013-04-20: 30 mg via ORAL
  Administered 2013-04-20 – 2013-04-21 (×2): 20 mg via ORAL
  Administered 2013-04-23 – 2013-04-28 (×5): 30 mg via ORAL
  Filled 2013-04-18: qty 3
  Filled 2013-04-18 (×2): qty 6
  Filled 2013-04-18: qty 4
  Filled 2013-04-18: qty 6
  Filled 2013-04-18: qty 3
  Filled 2013-04-18: qty 4
  Filled 2013-04-18 (×3): qty 6

## 2013-04-18 MED ORDER — ONDANSETRON HCL 4 MG PO TABS: 4.0000 mg | ORAL_TABLET | Freq: Four times a day (QID) | ORAL | Status: DC | PRN

## 2013-04-18 MED ORDER — INSULIN ASPART PROT & ASPART (70-30 MIX) 100 UNIT/ML ~~LOC~~ SUSP: 28.0000 [IU] | Freq: Every day | SUBCUTANEOUS | Status: DC

## 2013-04-18 MED ORDER — DEXAMETHASONE 4 MG PO TABS
4.0000 mg | ORAL_TABLET | Freq: Four times a day (QID) | ORAL | Status: DC
  Administered 2013-04-18 – 2013-04-28 (×37): 4 mg via ORAL
  Filled 2013-04-18 (×41): qty 1

## 2013-04-18 MED ORDER — ONDANSETRON HCL 4 MG/2ML IJ SOLN: 4.0000 mg | Freq: Four times a day (QID) | INTRAMUSCULAR | Status: DC | PRN

## 2013-04-18 MED ORDER — POLYETHYLENE GLYCOL 3350 17 G PO PACK
17.0000 g | PACK | Freq: Every day | ORAL | Status: DC | PRN
  Administered 2013-04-22: 17 g via ORAL
  Filled 2013-04-18: qty 1

## 2013-04-18 MED ORDER — HYDROMORPHONE HCL PF 1 MG/ML IJ SOLN
1.0000 mg | INTRAMUSCULAR | Status: DC | PRN
  Administered 2013-04-18: 2 mg via INTRAVENOUS
  Administered 2013-04-18 – 2013-04-19 (×2): 1 mg via INTRAVENOUS
  Administered 2013-04-19 – 2013-04-20 (×3): 2 mg via INTRAVENOUS
  Administered 2013-04-20: 1 mg via INTRAVENOUS
  Administered 2013-04-21 (×3): 2 mg via INTRAVENOUS
  Administered 2013-04-21 – 2013-04-22 (×3): 1 mg via INTRAVENOUS
  Administered 2013-04-22: 2 mg via INTRAVENOUS
  Administered 2013-04-22: 1 mg via INTRAVENOUS
  Administered 2013-04-22 (×2): 2 mg via INTRAVENOUS
  Administered 2013-04-23: 1 mg via INTRAVENOUS
  Administered 2013-04-23 – 2013-04-28 (×5): 2 mg via INTRAVENOUS
  Filled 2013-04-18: qty 1
  Filled 2013-04-18 (×9): qty 2
  Filled 2013-04-18: qty 1
  Filled 2013-04-18 (×2): qty 2
  Filled 2013-04-18: qty 1
  Filled 2013-04-18 (×2): qty 2
  Filled 2013-04-18 (×2): qty 1
  Filled 2013-04-18 (×6): qty 2

## 2013-04-18 MED ORDER — LISINOPRIL 10 MG PO TABS: 10.0000 mg | ORAL_TABLET | Freq: Every day | ORAL | Status: DC

## 2013-04-18 MED ORDER — HEPARIN SODIUM (PORCINE) 5000 UNIT/ML IJ SOLN: 5000.0000 [IU] | Freq: Three times a day (TID) | INTRAMUSCULAR | Status: DC

## 2013-04-18 MED ORDER — SODIUM CHLORIDE 0.9 % IV SOLN
INTRAVENOUS | Status: DC
  Administered 2013-04-18: via INTRAVENOUS

## 2013-04-18 MED ORDER — INSULIN ASPART PROT & ASPART (70-30 MIX) 100 UNIT/ML ~~LOC~~ SUSP
28.0000 [IU] | Freq: Every day | SUBCUTANEOUS | Status: DC
  Administered 2013-04-18 – 2013-04-27 (×10): 28 [IU] via SUBCUTANEOUS
  Filled 2013-04-18 (×2): qty 10

## 2013-04-18 MED ORDER — FENTANYL 100 MCG/HR TD PT72
100.0000 ug | MEDICATED_PATCH | TRANSDERMAL | Status: DC
  Administered 2013-04-20 – 2013-04-26 (×3): 100 ug via TRANSDERMAL
  Filled 2013-04-18 (×3): qty 1

## 2013-04-18 MED ORDER — INSULIN ASPART 100 UNIT/ML ~~LOC~~ SOLN: 0.0000 [IU] | Freq: Three times a day (TID) | SUBCUTANEOUS | Status: DC

## 2013-04-18 MED ORDER — SENNA 8.6 MG PO TABS
1.0000 | ORAL_TABLET | Freq: Two times a day (BID) | ORAL | Status: DC
  Administered 2013-04-18 – 2013-04-27 (×19): 8.6 mg via ORAL
  Filled 2013-04-18 (×19): qty 1

## 2013-04-18 MED ORDER — PANTOPRAZOLE SODIUM 40 MG PO TBEC
40.0000 mg | DELAYED_RELEASE_TABLET | Freq: Every day | ORAL | Status: DC
  Administered 2013-04-18 – 2013-04-27 (×10): 40 mg via ORAL
  Filled 2013-04-18 (×12): qty 1

## 2013-04-18 MED ORDER — LIDOCAINE-PRILOCAINE 2.5-2.5 % EX CREA: 1.0000 "application " | TOPICAL_CREAM | Freq: Every day | CUTANEOUS | Status: DC | PRN

## 2013-04-18 MED ORDER — INSULIN ASPART 100 UNIT/ML ~~LOC~~ SOLN
0.0000 [IU] | Freq: Three times a day (TID) | SUBCUTANEOUS | Status: DC
  Administered 2013-04-18: 7 [IU] via SUBCUTANEOUS
  Administered 2013-04-19 – 2013-04-20 (×2): 3 [IU] via SUBCUTANEOUS
  Administered 2013-04-20 – 2013-04-21 (×3): 4 [IU] via SUBCUTANEOUS
  Administered 2013-04-21 – 2013-04-22 (×2): 3 [IU] via SUBCUTANEOUS
  Administered 2013-04-22: 4 [IU] via SUBCUTANEOUS
  Administered 2013-04-22: 3 [IU] via SUBCUTANEOUS
  Administered 2013-04-23 – 2013-04-24 (×4): 4 [IU] via SUBCUTANEOUS
  Administered 2013-04-25: 3 [IU] via SUBCUTANEOUS
  Administered 2013-04-25 – 2013-04-26 (×3): 4 [IU] via SUBCUTANEOUS
  Administered 2013-04-26: 3 [IU] via SUBCUTANEOUS
  Administered 2013-04-27 (×2): 4 [IU] via SUBCUTANEOUS

## 2013-04-18 MED ORDER — HYDROCHLOROTHIAZIDE 25 MG PO TABS: 25.0000 mg | ORAL_TABLET | Freq: Every day | ORAL | Status: DC

## 2013-04-18 MED ORDER — DOXAZOSIN MESYLATE 8 MG PO TABS
8.0000 mg | ORAL_TABLET | Freq: Every day | ORAL | Status: DC
  Administered 2013-04-18 – 2013-04-27 (×8): 8 mg via ORAL
  Filled 2013-04-18 (×11): qty 1

## 2013-04-18 NOTE — Progress Notes (Signed)
Subjective/Complaints: Right arm bothering him again today. Overall pain a little better. Used iv morphine twice in last 24. A 12 point review of systems has been performed and if not noted above is otherwise negative.    Objective: Vital Signs: Blood pressure 139/60, pulse 102, temperature 98.4 F (36.9 C), temperature source Oral, resp. rate 18, height 5\' 7"  (1.702 m), weight 77.1 kg (169 lb 15.6 oz), SpO2 99.00%. No results found. No results found for this basename: WBC, HGB, HCT, PLT,  in the last 72 hours No results found for this basename: NA, K, CL, CO, GLUCOSE, BUN, CREATININE, CALCIUM,  in the last 72 hours CBG (last 3)   Recent Labs  04/17/13 1639 04/17/13 2101 04/18/13 0759  GLUCAP 203* 148* 176*    Wt Readings from Last 3 Encounters:  04/05/13 77.1 kg (169 lb 15.6 oz)  04/07/13 76.658 kg (169 lb)  04/05/13 74.481 kg (164 lb 3.2 oz)    Physical Exam:    Constitutional: He is oriented to person, place, and time. Appears uncomfortable HENT: oral mucosa pink and moist  Head: Normocephalic.  Eyes: EOM are normal.  Neck: Normal range of motion. Neck supple. No thyromegaly present.  Cardiovascular: Normal rate and regular rhythm.  Pulmonary/Chest: Effort normal and breath sounds normal. No respiratory distress.  Abdominal: Soft. Bowel sounds are normal. He exhibits no distension.  Neurological: He is alert and oriented to person, place, and time.  Mid thoracic sensory level, below which he has 1+ sensation (senses pain stim but has difficulty discerning light touch and proprioception). UE strength 5/5 except for right HI which are tr to 1 currently.. RLE: HF2-, KE 2 to 2+, ADF and APF 3 to 3+. dtr's tr to 1+ . LLE is gross is grossly 3 to 3+/5 proximal to distal  Musc: continued pain over chest and right axilla. Skin:  Back incision is clean and dry  Psychiatric: appears fatigued, a little disoriented, anxious   Assessment/Plan: 1. Functional deficits secondary to  metastatic prostate cancer to the T-spine/L-spine s/p thoracic decompression which require 3+ hours per day of interdisciplinary therapy in a comprehensive inpatient rehab setting. Physiatrist is providing close team supervision and 24 hour management of active medical problems listed below. Physiatrist and rehab team continue to assess barriers to discharge/monitor patient progress toward functional and medical goals.   Appreciate Dr. Lubertha Basque help. She will be meeting with the patient and his wife today to discuss expectations, further care, etc. He is having a difficult time tolerating therapy. If he wishes to continue with XRT, I feel that the most appropriate venue for his care would be [facility] oncology unit.     FIM: FIM - Bathing Bathing Steps Patient Completed: Chest;Right Arm;Left Arm;Abdomen;Front perineal area Bathing: 3: Mod-Patient completes 5-7 2f 10 parts or 50-74%  FIM - Upper Body Dressing/Undressing Upper body dressing/undressing steps patient completed: Thread/unthread right sleeve of pullover shirt/dresss;Thread/unthread left sleeve of pullover shirt/dress Upper body dressing/undressing: 3: Mod-Patient completed 50-74% of tasks FIM - Lower Body Dressing/Undressing Lower body dressing/undressing steps patient completed: Thread/unthread right pants leg;Thread/unthread left pants leg Lower body dressing/undressing: 1: Total-Patient completed less than 25% of tasks  FIM - Toileting Toileting steps completed by patient: Performs perineal hygiene Toileting Assistive Devices: Grab bar or rail for support Toileting: 0: Activity did not occur  FIM - Diplomatic Services operational officer Devices: Therapist, music Transfers: 0-Activity did not occur  FIM - Banker Devices: Sliding board;Arm rests Bed/Chair Transfer: 0:  Activity did not occur  FIM - Locomotion: Wheelchair Distance: 150' Locomotion: Wheelchair: 0: Activity did not  occur FIM - Locomotion: Ambulation Locomotion: Ambulation Assistive Devices: Designer, industrial/product Ambulation/Gait Assistance: 3: Mod assist;4: Min assist Locomotion: Ambulation: 0: Activity did not occur  Comprehension Comprehension Mode: Auditory Comprehension: 7-Follows complex conversation/direction: With no assist  Expression Expression Mode: Verbal Expression: 6-Expresses complex ideas: With extra time/assistive device  Social Interaction Social Interaction: 5-Interacts appropriately 90% of the time - Needs monitoring or encouragement for participation or interaction.  Problem Solving Problem Solving: 5-Solves basic 90% of the time/requires cueing < 10% of the time  Memory Memory: 5-Recognizes or recalls 90% of the time/requires cueing < 10% of the time  Medical Problem List and Plan:  1. 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 DVT Prophylaxis/Anticoagulation: Subcutaneous heparin. Monitor platelet counts any signs of bleeding. Check vascular studies on admit.  3. Pain Management: Duragesic patch 75 mcg, hydrocodone and robaxin as needed. Monitor with increased mobility  -adjust regimen as needed for comfort and to allow participation with therapies   -continued chest, right arm, back pain  -increased decadron to 8mg   -increased fentanyl patch to 125 mcg  -gabapentin  400mg  tid  -continue prn dilaudid and iv mso4 but will try to move away from the IV mso4 before dc  -oxy IR 15mg -30mg  for breakthrough pain  4. Neuropsych: This patient is capable of making decisions on his own behalf.  5. Acute blood loss anemia. Fe supp. Serial hgb's  6. Hypertension. Cardura 8 mg each bedtime, hydrochlorothiazide 25 mg daily, lisinopril 10 mg daily.  7. Diabetes mellitus with peripheral neuropathy. Hemoglobin A1c is 6. Monitor closely while on Decadron therapy. NovoLog Mix 70/30 20 units daily 8. Oncology. XRT per  rad-onc -decadron increased -XRT performing radiation to thoracic spine now for palliation. -[name] has discussed prognosis, situation with pt as well---unfortunately he's still not very realistic regarding pain and overall expectations. There is definite cognitive/awareness component as well. -9. FEN:  -appetite reasonable  LOS (Days) 13 A FACE TO FACE EVALUATION WAS PERFORMED  SWARTZ,ZACHARY T 04/18/2013 8:26 AM

## 2013-04-18 NOTE — Progress Notes (Signed)
Occupational Therapy Session Note  Patient Details  Name: Andrew Nelson MRN: [id number] Date of Birth: Jan 10, 1951  Today's Date: 04/18/2013 Time: 0700-0800 Time Calculation (min): 60 min  Short Term Goals: Week 2:  OT Short Term Goal 1 (Week 2): Pt will perform LB dressing with min A using AE PRN OT Short Term Goal 2 (Week 2): Pt will perform toileting tasks with min a OT Short Term Goal 3 (Week 2): Pt will direct caregivers independently for assistance with transfers and ADLs  Skilled Therapeutic Interventions/Progress Updates:    Pt resting in bed with wife at bedside upon arrival.  Pt agreed to bathing and dressing at bed level per previous day's discussion.  Pt initially sat EOB with min A to bathe and dress UB.  Pt stated that sitting up put pressure on chest and increased pain level.  Recommended to patient and wife that in the future patient bathe UB with HOB elevated to relieve the pressure.  Both verbalized understanding and agreement.  Pt completed LB bathing and dressing with increased assistance.  Pt rolls side to side with min A and extra time.  Small amount of blood noted from lower back incision and RN notified.  Pt was able to partially bridge to allow therapist and wife to pull up pants.  Pt stated at the end of the session that although bathing and dressing is still challenging it is "easier" at bed level than sitting in w/c at sink.  Pt continues to experience increased pain with transitional movements and requires extra time to complete all tasks.  Pt's wife actively participated in bathing and dressing tasks and providing appropriate verbal cues throughout session.  Therapy Documentation Precautions:  Precautions Precautions: Fall;Back Precaution Comments: able to recall 2/3 back precautions Restrictions Weight Bearing Restrictions: No Pain: Pain Assessment Pain Assessment: 0-10 Pain Score: 7  Pain Type: Acute pain Pain Location: Arm Pain Orientation: Right Pain  Descriptors / Indicators: Aching Pain Frequency: Intermittent Pain Onset: On-going Pain Intervention(s): RN made aware;Repositioned  See FIM for current functional status  Therapy/Group: Individual Therapy  Rich Brave 04/18/2013, 8:02 AM

## 2013-04-18 NOTE — Plan of Care (Signed)
Problem: RH SKIN INTEGRITY Goal: RH STG SKIN FREE OF INFECTION/BREAKDOWN Skin free of infection/breakdown with minimal assistance  Outcome: Not Met (add Reason) Stage 2 pressure ulcer noted to coccyx and left inner buttocks.

## 2013-04-18 NOTE — Plan of Care (Cosign Needed)
Dr. Elder Love with Radiation onco called. Says will defer additional evaluation of this patient to rounding AM physician Dr. Kathrynn Running. Per his report apparently the pt is already receiving OP rad therapy.  Junious Silk, ANP

## 2013-04-18 NOTE — Progress Notes (Signed)
Occupational Therapy Note  Patient Details  Name: Andrew Nelson MRN: [id number] Date of Birth: [birth year] Today's Date: 04/18/2013    Pt missed 30 mins skilled OT services. Pt meeting with Palliative Care staff. Lavone Neri [facility] 04/18/2013, 2:18 PM

## 2013-04-18 NOTE — Progress Notes (Signed)
Request for transfer to [facility] for pain management and failure to thrive. Medical floor requested. Pt follows with [name].  Debbora Presto, MD  [facility] Pager [phone]  If 7PM-7AM, please contact night-coverage [email or web address] Password [email or web address]

## 2013-04-18 NOTE — Discharge Summary (Signed)
Andrew Nelson, Andrew Nelson              ACCOUNT NO.:  [id number]  MEDICAL RECORD NO.:  [id number]  LOCATION:  [id number]                        FACILITY:  [facility]  PHYSICIAN:  Ranelle Oyster, M.D.DATE OF BIRTH:  05/09/1951  DATE OF ADMISSION:  04/05/2013 DATE OF DISCHARGE:  04/18/2013                              DISCHARGE SUMMARY   DISCHARGE DIAGNOSES: 1. 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 2. Thoracic myelopathy, status post thoracic T4-5 tumor decompression. 3. Subcutaneous heparin for deep venous thrombosis prophylaxis. 4. Pain management. 5. Acute blood loss anemia. 6. Hypertension. 7. Diabetes mellitus with peripheral neuropathy.  HISTORY OF PRESENT ILLNESS:  This is a 62 year old right-handed male with metastatic prostate cancer with thoracic T6-7 and lumbar L5 laminectomy, resection of epidural tumor on March 14, 2013 and discharged to home on March 16, 2013.  He was seen by his oncologist, [name] on March 29, 2013, with reported acute onset lower extremity weakness to the point he could not get out of bed.  He was admitted on March 29, 2013, for further evaluation.  Emergent workup demonstrated evidence of marked progression of his epidural tumor at T4-5 level with severe spinal cord cauda equina compression.  He underwent resection of tumor decompression on March 30, 2013, per Dr. Jordan Likes.  Postoperative pain management.  Placed on subcutaneous heparin for DVT prophylaxis. Radiation Oncology follow up, Dr. Kathrynn Running for radiation as advised. Physical and occupational therapy ongoing.  The patient was admitted for comprehensive rehab program.  PAST MEDICAL HISTORY:  See discharge diagnoses.  SOCIAL HISTORY:  Lives with spouse.  Functional status upon admission to rehab services was +2 total assist.  PHYSICAL EXAMINATION:  VITAL SIGNS:  Blood pressure 123/55, pulse 72, temperature 96, respirations 16. GENERAL:  This was an alert male, oriented x3. HEENT:  Pupils  round and reactive to light. LUNGS:  Decreased breath sounds.  Clear to auscultation. CARDIAC:  Regular rate and rhythm. ABDOMEN:  Soft, nontender.  Good bowel sounds.  He had midthoracic sensory level below, which was 1+ sensation.  REHABILITATION HOSPITAL COURSE:  The patient was admitted to Inpatient Rehab Services with therapies initiated on a 3-hour daily basis consisting of physical therapy, occupational therapy, and rehabilitation nursing.  The following issues were addressed during the patient's rehabilitation stay.  Pertaining to 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 Jordan Likes.  He would follow up Radiation Oncology Services with palliative care as advised. Subcutaneous heparin for DVT prophylaxis.  Pain management ongoing with Duragesic patch, increased to 125 mcg.  He was receiving Neurontin 400 mg 3 times daily, Decadron taper as advised as well as Dilaudid for breakthrough pain as well as intravenous morphine, which could be established further pain control with good results.  Blood pressures monitored with no orthostatic changes on Cardura, hydrochlorothiazide and lisinopril.  He did have a history of diabetes mellitus, peripheral neuropathy.  Hemoglobin A1c of 6.  He remained on insulin therapy as advised.  With patient's progressive metastatic prostate cancer and limited mobility, Oncology Services as well as Palliative Care being consulted all issues in regards to prognosis were discussed with the patient.  It was felt at this time he should be discharged to [facility] for palliative radiation therapy at this time with follow up per Oncology Services as well as Dr. Ladona Ridgel of palliative care.  The [facility] team will continue to follow the patient medically upon admission to [facility].  DISCHARGE MEDICATIONS:  At the time of dictation included a 1.  Regular diet. 2. Decadron 8 mg p.o. every 6 hours. 3. Cardura 8 mg p.o. at bedtime. 4. Duragesic patch 125 mcg, change every 72 hours. 5. Ferrous sulfate 325 mg p.o. b.i.d. 6. Neurontin 400 mg p.o. t.i.d. 7. Subcutaneous heparin 5000 units every 8 hours subcutaneously. 8. Hydrochlorothiazide 25 mg p.o. daily. 9. Dilaudid 4 mg p.o. every 3 hours as needed for severe pain. 10.NovoLog Mix 70/30 28 units subcutaneously daily. 11.Lisinopril 10 mg p.o. daily. 12.Morphine injection 2 mg intravenously every 3 hours as needed for     severe pain. 13.Oxycodone Immediate Release 15-30 mg p.o. every 4 hours as needed     for pain. 14.Protonix 40 mg p.o. at bedtime. 15.Senokot tablet 1 p.o. b.i.d.  The patient should continue with radiation therapy as advised per Radiation Oncology team, Dr. Kathrynn Running.  Follow up with [name], oncology Services as well as Dr. Jordan Likes, Neurosurgery.     Mariam Dollar, P.A.   ______________________________ Ranelle Oyster, M.D.    DA/MEDQ  D:  04/18/2013  T:  04/18/2013  Job:  [id number]  cc:   [name], M.D. [name], M.D.

## 2013-04-18 NOTE — Plan of Care (Signed)
Problem: RH PAIN MANAGEMENT Goal: RH STG PAIN MANAGED AT OR BELOW PT'S PAIN GOAL Less than 4 out of 10  Outcome: Not Met (add Reason) Lowest pain score 5.

## 2013-04-18 NOTE — Progress Notes (Signed)
Occupational Therapy Discharge Summary  Patient Details  Name: Andrew Nelson MRN: [id number] Date of Birth: July 18, 1951  Today's Date: 04/18/2013  Patient has met 4 of 8 long term goals due to decline in medical status, increased pain, discharged from CIR to Palliative Care at [facility].  Pt's progress continued to decline over the past few days secondary to increased pain in RUE and sternal area.  Pt reported that pain was more intense with movements which limited pt's participation in BADLs.  Pt required extra time to complete all tasks with multiple rest breaks throughout sessions.  Family education with pt's wife had been initiated but discharge plan has been changed to discharge to [facility]. Patient to discharge at overall mod-max level.    Reasons goals not met: Decline in medical status, increased pain, discharged from CIR to Palliative Care at [facility]  Recommendation:  Patient will benefit from ongoing skilled OT services in Palliative Care setting to continue family/caregiver education needed if patient/family decide to discharge home.  Equipment: No equipment provided Pt discharged to [facility]  Reasons for discharge: change in medical status and discharge from hospital  Patient/family agrees with initial LTGs and progress that was initially made.  OT Discharge Precautions/Restrictions    ADL ADL Grooming: Supervision/safety Upper Body Bathing: Supervision/safety Where Assessed-Upper Body Bathing: Edge of bed Lower Body Bathing: Maximal assistance Where Assessed-Lower Body Bathing: Bed level Upper Body Dressing: Minimal assistance Where Assessed-Upper Body Dressing: Edge of bed Lower Body Dressing: Dependent Where Assessed-Lower Body Dressing: Bed level Toileting: Maximal assistance Where Assessed-Toileting: Teacher, adult education: Moderate assistance Vision/Perception  Vision - History Baseline Vision: No visual deficits Patient Visual  Report: No change from baseline  Cognition Overall Cognitive Status: Within Functional Limits for tasks assessed Arousal/Alertness: Awake/alert Orientation Level: Oriented X4 Sensation Sensation Light Touch: Impaired Detail Light Touch Impaired Details: Impaired RUE Coordination Gross Motor Movements are Fluid and Coordinated: Yes Fine Motor Movements are Fluid and Coordinated: Not tested    Trunk/Postural Assessment  Cervical Assessment Cervical Assessment: Exceptions to [facility] Cervical AROM Overall Cervical AROM Comments: decreased rotation, flexed Thoracic Assessment Thoracic Assessment: Exceptions to [facility] Thoracic AROM Overall Thoracic AROM Comments: kyphotic Thoracic Strength Overall Thoracic Strength Comments: unable to perform active scapular retraction  Lumbar Assessment Lumbar Assessment: Exceptions to [facility] Lumbar Strength Overall Lumbar Strength Comments: generalized weakness s/p surgery Postural Control Postural Control: Within Functional Limits  Balance Static Sitting Balance Static Sitting - Balance Support: Feet supported Static Sitting - Level of Assistance: 5: Stand by assistance Extremity/Trunk Assessment RUE Assessment RUE Assessment: Exceptions to [facility] (Limited AROM at shoulder and limited grasp; general weakness) LUE Assessment LUE Assessment: Within Functional Limits  See FIM for current functional status  Rich Brave 04/18/2013, 3:08 PM

## 2013-04-18 NOTE — H&P (Signed)
[facility] History and Physical  Andrew Nelson WUJ:[id number] DOB: 03-Jan-1951 DOA: 04/18/2013  Referring physician:  PCP: Andrew Georges, MD  Specialists: [name] and Dr. Kathrynn Running  Chief Complaint: Worsening R sided chest and right upper extremity pain  HPI: Andrew Nelson is a 62 y.o. male with history of metastatic prostate cancer>>with spinal mets including with thoracic spine bony metastatic disease and other medical problems as listed below, recently discharged Andrew Nelson toto [facility] inpatient rehabilitation and presents with above complaints. Patient states that for the past week the pain in his right chest wall, R shoulder and upper extremity has been worsening. He describes it as 10 out of 10 in intensity and and when it starts he is on able to do anything. Because of the pain he is not been able to participate in rehabilitation activities and so was transferred back to acute care for further pain management and continued radiation which he has been receiving while at CIR. He denies associated nausea or vomiting, shortness of breath and no diaphoresis.   Review of Systems: The patient denies anorexia, fever,, vision loss, decreased hearing, hoarseness, syncope, dyspnea on exertion, peripheral edema, hemoptysis, abdominal pain, melena, hematochezia, severe indigestion/heartburn, hematuria,  suspicious skin lesions, transient blindness depression, unusual weight change, abnormal bleeding   Past Medical History  Diagnosis Date  . Diabetes mellitus   . Hypertension   . Prostate ca 6/07    prostate ca dx  . Allergy   . Anemia   . CAD (coronary artery disease)   . Blood transfusion without reported diagnosis     May 2013  . History of radiation therapy 09/19/12-10/10/2012    T-7-T9,35Gy/11fx,Prescaral mass 35Gy/62fx  . History of radiation therapy 05/2006-06/2006    salvage prostate rad tx 68.4Gy  . Bone cancer     prostate ca with mets to spine   Past Surgical  History  Procedure Laterality Date  . Prostatectomy  03/03/06    radical,gleason 4+5=9,Adenocarcinoma  . Colonoscopy w/ polypectomy  03/01/01    hyperplastic,no adenomatous change or malignancy  . Portacath placement      right  . Laminectomy N/A 03/14/2013    Procedure: Thoracic six-seven laminectomy, resection of tumor. Lumbar five laminectomy and resection of tumor;  Surgeon: Temple Pacini, MD;  Location: [facility] NEURO ORS;  Service: Neurosurgery;  Laterality: N/A;  . Laminectomy Bilateral 03/30/2013    Procedure: THORACIC LAMINECTOMY FOR TUMOR;  Surgeon: Temple Pacini, MD;  Location: [facility] NEURO ORS;  Service: Neurosurgery;  Laterality: Bilateral;  Thoracic Four-Six Laminectomy for Tumor   Social History:  reports that he has quit smoking. He has never used smokeless tobacco. He reports that he does not drink alcohol or use illicit drugs.  where does patient live--home Can patient participate in ADLs?  Allergies  Allergen Reactions  . Aprindine Other (See Comments)    unknown  . Bc Powder [Aspirin-Salicylamide-Caffeine] Hives  . Penicillins Hives    Family History  Problem Relation Age of Onset  . Cancer Other     prostate  . Stroke Other   . Prostate cancer Father   . Colon cancer Paternal Grandfather     Prior to Admission medications   Medication Sig Start Date End Date Taking? Authorizing Provider  dexamethasone (DECADRON) 4 MG tablet Take 1 tablet (4 mg total) by mouth every 6 (six) hours. 04/05/13  Yes Christina P Rama, MD  diazepam (VALIUM) 5 MG tablet Take 5-10 mg by mouth every 6 (six) hours as needed  for anxiety.   Yes Historical Provider, MD  doxazosin (CARDURA) 8 MG tablet Take 8 mg by mouth at bedtime.   Yes Historical Provider, MD  fentaNYL (DURAGESIC - DOSED MCG/HR) 100 MCG/HR Place 1 patch onto the skin every 3 (three) days.   Yes Historical Provider, MD  fentaNYL (DURAGESIC - DOSED MCG/HR) 25 MCG/HR patch Place 1 patch onto the skin every 3 (three) days.   Yes Historical  Provider, MD  heparin 5000 UNIT/ML injection Inject 1 mL (5,000 Units total) into the skin every 8 (eight) hours. 04/05/13  Yes Christina P Rama, MD  hydrochlorothiazide (HYDRODIURIL) 25 MG tablet Take 25 mg by mouth daily.   Yes Historical Provider, MD  HYDROmorphone (DILAUDID) 1 MG/ML SOLN injection Inject 0.5-1 mLs (0.5-1 mg total) into the vein every 2 (two) hours as needed for severe pain. 04/05/13  Yes Christina P Rama, MD  insulin aspart (NOVOLOG) 100 UNIT/ML injection Inject 0-20 Units into the skin 3 (three) times daily with meals. 04/05/13  Yes Maryruth Bun Rama, MD  insulin aspart protamine-insulin aspart (NOVOLOG 70/30) (70-30) 100 UNIT/ML injection Inject 20 Units into the skin daily with supper.    Yes Historical Provider, MD  leuprolide (LUPRON) 30 MG injection Inject 30 mg into the muscle every 4 (four) months. Pt had 2 weeks ago   Yes Historical Provider, MD  lidocaine-prilocaine (EMLA) cream Apply 1 application topically daily as needed (APPLY TO PORTA-CATH SITE 30 MINS TO 2 HOURS PRIOR TO TREATMENT & COVER WITH PLASTIC WRAP).   Yes Historical Provider, MD  lisinopril (PRINIVIL,ZESTRIL) 10 MG tablet Take 10 mg by mouth daily.   Yes Historical Provider, MD  metFORMIN (GLUCOPHAGE) 850 MG tablet Take 850 mg by mouth 2 (two) times daily with a meal.   Yes Historical Provider, MD  oxyCODONE-acetaminophen (PERCOCET) 10-325 MG per tablet Take 1-2 tablets by mouth every 4 (four) hours as needed for pain. 03/16/13  Yes Temple Pacini, MD  pantoprazole (PROTONIX) 40 MG tablet Take 1 tablet (40 mg total) by mouth at bedtime. 04/05/13  Yes Christina P Rama, MD  polyethylene glycol (MIRALAX / GLYCOLAX) packet Take 17 g by mouth daily as needed (for constipation).   Yes Historical Provider, MD  senna (SENOKOT) 8.6 MG TABS tablet Take 1 tablet (8.6 mg total) by mouth 2 (two) times daily. 04/05/13  Yes Maryruth Bun Rama, MD  ACCU-CHEK AVIVA PLUS test strip USE AS DIRECTED 05/18/12   Stacie Glaze, MD    Physical Exam: Filed Vitals:   04/18/13 1650  BP: 129/61  Pulse: 99  Temp: 98.6 F (37 C)  Resp: 18   Constitutional: Vital signs reviewed.  Patient is a well-developed in no acute distress and cooperative with exam. Alert and oriented x3.  Head: Normocephalic and atraumatic Nose: No erythema or drainage noted.   Mouth: no erythema or exudates, MMM Eyes: PERRL, EOMI, conjunctivae normal, No scleral icterus.  Neck: Supple, Trachea midline normal ROM, No JVD, mass, thyromegaly, or carotid bruit present.  Cardiovascular: RRR, S1 normal, S2 normal, no MRG, pulses symmetric and intact bilaterally Pulmonary/Chest: normal respiratory effort, decreased breath sounds at the bases, no wheezes. Abdominal: Soft. Non-tender, non-distended, bowel sounds are normal, no masses, organomegaly, or guarding present.  GU: no CVA tenderness Musculoskeletal: No joint deformities, erythema, or stiffness, ROM full and no nontender Hematology: no cervical, inginal, or axillary adenopathy.  Neurological: A&O x3, upper extremity strength 4+/ 5 , right lower extremity2/5, left lower extremity 3+/5 cranial nerve II-XII are grossly  intact, no focal motor deficit, sensory grossly intact .  Skin: Warm, dry and intact. No rash, cyanosis, or clubbing.  Psychiatric: Normal mood and affect. speech and behavior is normal.    Labs on Admission:  Basic Metabolic Panel: No results found for this basename: NA, K, CL, CO2, GLUCOSE, BUN, CREATININE, CALCIUM, MG, PHOS,  in the last 168 hours Liver Function Tests: No results found for this basename: AST, ALT, ALKPHOS, BILITOT, PROT, ALBUMIN,  in the last 168 hours No results found for this basename: LIPASE, AMYLASE,  in the last 168 hours No results found for this basename: AMMONIA,  in the last 168 hours CBC: No results found for this basename: WBC, NEUTROABS, HGB, HCT, MCV, PLT,  in the last 168 hours Cardiac Enzymes: No results found for this basename: CKTOTAL, CKMB,  CKMBINDEX, TROPONINI,  in the last 168 hours  BNP (last 3 results) No results found for this basename: PROBNP,  in the last 8760 hours CBG:  Recent Labs Lab 04/17/13 1639 04/17/13 2101 04/18/13 0759 04/18/13 1123 04/18/13 1741  GLUCAP 203* 148* 176* 174* 210*    Radiological Exams on Admission: No results found.    Assessment/Plan Active Problems: Right upper extremity and chest pain  -Likely secondary to the extending thoracic epidural/extraosseous tumor as noted on 8/21 MRI -Continue dexamethasone -Consult radiation oncology- on-call called, awaiting callback -Will also obtain EKG cycle troponins and follow -Pain management and-continue fentanyl and when necessary oxycodone, also add IV Dilaudid when necessary   DM w/o Complication Type I -Continue insulin 7030, metformin and sliding scale   PROSTATE CANCER, HX OF/  Metastasis of neoplasm to spinal canal -Followed by [name], follow and consult   Hypertension  -Continue outpatient medications.   Anemia of chronic disease -Secondary to above, Obtain CBC and follow   Protein-calorie malnutrition, moderate      Code Status: FULL Family Communication: NONE at bedside Disposition Plan: admitted to med surge  Time spent: >30  Kela Millin [facility] Pager [phone]  If 7PM-7AM, please contact night-coverage [email or web address] Password [email or web address] 04/18/2013, 7:02 PM

## 2013-04-18 NOTE — Progress Notes (Signed)
Pt transferred to [facility] today.  Will sign off at this time.  Rumor Sun, LCSW

## 2013-04-18 NOTE — Discharge Summary (Signed)
Discharge summary job [id number]

## 2013-04-18 NOTE — Progress Notes (Signed)
Called report to [facility] E[facility] to Andrew Nelson, Charity fundraiser.  Patient verbalized understanding of discharge instructions, no questions asked. Wife at bedside.  [facility] to transfer patient. Dani Gobble

## 2013-04-18 NOTE — Progress Notes (Signed)
Occupational Therapy  Cancellation Note  Patient Details  Name: ADIS STURGILL MRN: [id number] Date of Birth: 11/28/1950  Today's Date: 04/18/2013  Patient missed his scheduled 30 min OT session this AM secondary to patient taken off campus early to his daily radiology treatment  Sylwia Cuervo 04/18/2013, 8:16 AM

## 2013-04-18 NOTE — Progress Notes (Signed)
Physical Therapy Discharge Summary  Patient Details  Name: Andrew Nelson MRN: [id number] Date of Birth: 04-27-1951  Today's Date: 04/18/2013  Patient has met 5 of 8 long term goals due to increased strength and ability to compensate for deficits.  Patient to discharge at a wheelchair level Min Assist.   The team has worked very hard to assist pt with appropriate progression however pain has become overwhelming for the pt and is limiting all functional mobility. Patient's care partner unable to provide the necessary physical assistance at discharge and pt will be discharged to [facility]  Reasons goals not met: Pt has demonstrated poor functional gains during stay as a result of significant pain and rapidly advancing weakness due to aggressive metastatic disease. Pt is being discharged early to [facility] and therefore he has not met all of his functional goals.   Recommendation:  Patient will benefit from ongoing skilled PT services in the cancer center at [facility] to continue to advance safe functional mobility, address ongoing impairments in generalized weakness, decreased functional mobility as compared to baseline, impaired gait mechanics and standing balance, pain modulation, and minimize fall risk.  Equipment: No equipment provided  Reasons for discharge: discharge from hospital  Patient/family agrees with progress made and goals achieved: Yes  PT Discharge Precautions/Restrictions Precautions Precautions: Fall;Back Restrictions Weight Bearing Restrictions: No  Vision/Perception  Vision - History Baseline Vision: No visual deficits Patient Visual Report: No change from baseline  Cognition Overall Cognitive Status: Within Functional Limits for tasks assessed Arousal/Alertness: Awake/alert Orientation Level: Oriented X4 Sensation Sensation Light Touch: Impaired Detail Light Touch Impaired Details: Impaired RUE Proprioception: Impaired by gross  assessment (bil. LEs) Coordination Gross Motor Movements are Fluid and Coordinated: Yes Fine Motor Movements are Fluid and Coordinated: Not tested  Mobility Bed Mobility Bed Mobility: Rolling Left;Left Sidelying to Sit Rolling Left: 4: Min assist;With rail Left Sidelying to Sit: HOB flat;3: Mod assist;With rails Sit to Sidelying Left: 3: Mod assist Transfers Transfers: Yes Sit to Stand: From bed;With upper extremity assist;4: Min assist Sit to Stand Details (indicate cue type and reason): Cues for hand placement. Pt needs increased time due to pain.  Stand to Sit: With upper extremity assist;To bed;With armrests;4: Min assist Stand to Sit Details: Cues for UE positioning.  Squat Pivot Transfers: 4: Min assist Locomotion    Pt did not perform today due to pain but with most recent ambulation pt required up to moderate assistance with RW. He has decreased proprioception of LT. LE and needs cues for appropriate foot placement.  Trunk/Postural Assessment  Cervical Assessment Cervical Assessment: Exceptions to [facility] Cervical AROM Overall Cervical AROM Comments: decreased rotation, flexed Thoracic Assessment Thoracic Assessment: Exceptions to [facility] Thoracic AROM Overall Thoracic AROM Comments: kyphotic Thoracic Strength Overall Thoracic Strength Comments: unable to perform active scapular retraction  Lumbar Assessment Lumbar Assessment: Exceptions to [facility] Lumbar Strength Overall Lumbar Strength Comments: generalized weakness s/p surgery Postural Control Postural Control: Within Functional Limits  Balance Balance Balance Assessed: Yes Static Sitting Balance Static Sitting - Balance Support: Feet supported Static Sitting - Level of Assistance: 5: Stand by assistance Extremity Assessment  RUE Assessment RUE Assessment: Exceptions to [facility] (Limited AROM at shoulder and limited grasp; general weakness) LUE Assessment LUE Assessment: Within Functional Limits RLE Assessment RLE  Assessment: Exceptions to [facility] (Functionally very weak, unable to test appropriately  d/t pain.) LLE Assessment LLE Assessment: Exceptions to [facility] (Also functionally very weak, unable to test appropriately d/t pain)  See  FIM for current functional status  Wilhemina Bonito 04/18/2013, 3:50 PM

## 2013-04-18 NOTE — Progress Notes (Signed)
Physical Therapy Session Note  Patient Details  Name: Andrew Nelson MRN: [id number] Date of Birth: 1951/01/03  Today's Date: 04/18/2013 Time: 0830-0925 Time Calculation (min): 55 min  Short Term Goals: Week 2:  PT Short Term Goal 1 (Week 2): See LTGs  Skilled Therapeutic Interventions/Progress Updates:    Pt slowly finishing breakfast at start. Still in lots of pain. Very, very slowly pt able to come to EOB to allow nursing to look at back incisions, >10/10 chest pain sitting EOB relieved only slightly by leaning on railing. Decided not to get OOB this session. Lateral scooting with min assist. Mod assist for bed mobility today demonstrating some functional decline, pt limited mostly by pain. +2 to scoot up in bed. Pt's Rt. UE positioned with pillows d/t edema in arm and hand. Discussed need to get pain under control, pt in agreement.    Therapy Documentation Precautions:  Precautions Precautions: Fall;Back Precaution Comments: able to recall 2/3 back precautions Restrictions Weight Bearing Restrictions: No Pain: 8-10/10 chest and Rt. arm, RN aware  See FIM for current functional status  Therapy/Group: Individual Therapy  Wilhemina Bonito 04/18/2013, 12:29 PM

## 2013-04-18 NOTE — Plan of Care (Cosign Needed)
RN paged- Na+ 113- previous Na+ was 133 on 8/28- d/w RN: pt is alert and oriented- no apparent seizures- will check urine osmo/Na+ and serum osmo- BUN elevated so suspect due to [facility] so once lab obtained RN instructed to begin ordered IVF's- due to significant acute hyponatremia will transfer to SDU. Will notify attending MD. Family at bedside and updated on rationale for tx to SDU.  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 Have dc'd SQ Heparin. Given acutely ill will also dc glucophage and ACE I- cont SSI. Dr. Cena Benton aware  109 am: UPDATE: LDH mildly elevated as was d dimer but fibrinogen was normal- no schistocytes on smear so less likely in actIve DIC. Haptoglobin elevated. Suspect would benefit from onco eval in am- Clelia Croft is primary  Junious Silk, ANP

## 2013-04-18 NOTE — Plan of Care (Signed)
Problem: RH SKIN INTEGRITY Goal: RH STG MAINTAIN SKIN INTEGRITY WITH ASSISTANCE STG Maintain Skin Integrity With min Assistance.  Outcome: Not Met (add Reason) Developed pressure ulcer

## 2013-04-18 NOTE — Plan of Care (Signed)
Problem: RH SKIN INTEGRITY Goal: RH STG SKIN FREE OF INFECTION/BREAKDOWN Skin free of infection/breakdown with minimal assistance  Outcome: Not Met (add Reason) Developed pressure ulcer.

## 2013-04-19 ENCOUNTER — Ambulatory Visit: Payer: BC Managed Care – PPO

## 2013-04-19 ENCOUNTER — Encounter (HOSPITAL_COMMUNITY): Payer: Self-pay | Admitting: Radiology

## 2013-04-19 ENCOUNTER — Ambulatory Visit (HOSPITAL_COMMUNITY): Payer: BC Managed Care – PPO

## 2013-04-19 ENCOUNTER — Inpatient Hospital Stay: Admit: 2013-04-19 | Payer: BC Managed Care – PPO | Admitting: Radiation Oncology

## 2013-04-19 DIAGNOSIS — D696 Thrombocytopenia, unspecified: Secondary | ICD-10-CM

## 2013-04-19 DIAGNOSIS — D61818 Other pancytopenia: Secondary | ICD-10-CM

## 2013-04-19 DIAGNOSIS — C801 Malignant (primary) neoplasm, unspecified: Secondary | ICD-10-CM

## 2013-04-19 DIAGNOSIS — R079 Chest pain, unspecified: Secondary | ICD-10-CM

## 2013-04-19 DIAGNOSIS — R5383 Other fatigue: Secondary | ICD-10-CM

## 2013-04-19 DIAGNOSIS — C779 Secondary and unspecified malignant neoplasm of lymph node, unspecified: Secondary | ICD-10-CM

## 2013-04-19 DIAGNOSIS — I82629 Acute embolism and thrombosis of deep veins of unspecified upper extremity: Principal | ICD-10-CM

## 2013-04-19 DIAGNOSIS — C50919 Malignant neoplasm of unspecified site of unspecified female breast: Secondary | ICD-10-CM

## 2013-04-19 DIAGNOSIS — E109 Type 1 diabetes mellitus without complications: Secondary | ICD-10-CM

## 2013-04-19 DIAGNOSIS — R627 Adult failure to thrive: Secondary | ICD-10-CM

## 2013-04-19 DIAGNOSIS — M79609 Pain in unspecified limb: Secondary | ICD-10-CM

## 2013-04-19 DIAGNOSIS — I10 Essential (primary) hypertension: Secondary | ICD-10-CM

## 2013-04-19 DIAGNOSIS — M7989 Other specified soft tissue disorders: Secondary | ICD-10-CM

## 2013-04-19 DIAGNOSIS — R29898 Other symptoms and signs involving the musculoskeletal system: Secondary | ICD-10-CM

## 2013-04-19 DIAGNOSIS — Z515 Encounter for palliative care: Secondary | ICD-10-CM

## 2013-04-19 DIAGNOSIS — R7989 Other specified abnormal findings of blood chemistry: Secondary | ICD-10-CM | POA: Diagnosis present

## 2013-04-19 DIAGNOSIS — M899 Disorder of bone, unspecified: Secondary | ICD-10-CM

## 2013-04-19 DIAGNOSIS — I82409 Acute embolism and thrombosis of unspecified deep veins of unspecified lower extremity: Secondary | ICD-10-CM

## 2013-04-19 DIAGNOSIS — C61 Malignant neoplasm of prostate: Secondary | ICD-10-CM

## 2013-04-19 DIAGNOSIS — D638 Anemia in other chronic diseases classified elsewhere: Secondary | ICD-10-CM

## 2013-04-19 DIAGNOSIS — R5381 Other malaise: Secondary | ICD-10-CM

## 2013-04-19 LAB — CBC
HCT: 28.4 % — ABNORMAL LOW (ref 39.0–52.0)
Hemoglobin: 6.6 g/dL — CL (ref 13.0–17.0)
MCH: 27.5 pg (ref 26.0–34.0)
MCHC: 34.7 g/dL (ref 30.0–36.0)
MCHC: 34.6 g/dL (ref 30.0–36.0)
MCHC: 35.6 g/dL (ref 30.0–36.0)
MCV: 77.4 fL — ABNORMAL LOW (ref 78.0–100.0)
Platelets: 45 10*3/uL — ABNORMAL LOW (ref 150–400)
Platelets: 35 10*3/uL — ABNORMAL LOW (ref 150–400)
RBC: 2.43 MIL/uL — ABNORMAL LOW (ref 4.22–5.81)
RDW: 18.2 % — ABNORMAL HIGH (ref 11.5–15.5)
RDW: 16.6 % — ABNORMAL HIGH (ref 11.5–15.5)
WBC: 1.5 10*3/uL — ABNORMAL LOW (ref 4.0–10.5)

## 2013-04-19 LAB — BASIC METABOLIC PANEL
BUN: 25 mg/dL — ABNORMAL HIGH (ref 6–23)
BUN: 29 mg/dL — ABNORMAL HIGH (ref 6–23)
BUN: 31 mg/dL — ABNORMAL HIGH (ref 6–23)
Calcium: 6 mg/dL — CL (ref 8.4–10.5)
Calcium: 8.1 mg/dL — ABNORMAL LOW (ref 8.4–10.5)
Calcium: 8.4 mg/dL (ref 8.4–10.5)
Creatinine, Ser: 0.29 mg/dL — ABNORMAL LOW (ref 0.50–1.35)
Creatinine, Ser: 0.41 mg/dL — ABNORMAL LOW (ref 0.50–1.35)
Creatinine, Ser: 0.43 mg/dL — ABNORMAL LOW (ref 0.50–1.35)
GFR calc Af Amer: >90 mL/min (ref >90)
GFR calc Af Amer: >90 mL/min (ref >90)
GFR calc non Af Amer: >90 mL/min (ref >90)
GFR calc non Af Amer: >90 mL/min (ref >90)
Glucose, Bld: 124 mg/dL — ABNORMAL HIGH (ref 70–99)
Sodium: 114 mEq/L — CL (ref 135–145)

## 2013-04-19 LAB — DIC (DISSEMINATED INTRAVASCULAR COAGULATION)PANEL
D-Dimer, Quant: 2.04 ug/mL-FEU — ABNORMAL HIGH (ref 0.00–0.48)
Fibrinogen: 335 mg/dL (ref 204–475)
INR: 1.08 (ref 0.00–1.49)
Prothrombin Time: 13.8 seconds (ref 11.6–15.2)

## 2013-04-19 LAB — SODIUM, URINE, RANDOM: Sodium, Ur: 114 mEq/L

## 2013-04-19 LAB — HAPTOGLOBIN: Haptoglobin: 329 mg/dL — ABNORMAL HIGH (ref 45–215)

## 2013-04-19 LAB — MRSA PCR SCREENING: MRSA by PCR: NEGATIVE

## 2013-04-19 LAB — MAGNESIUM: Magnesium: 1.4 mg/dL — ABNORMAL LOW (ref 1.5–2.5)

## 2013-04-19 LAB — LACTATE DEHYDROGENASE: LDH: 387 U/L — ABNORMAL HIGH (ref 94–250)

## 2013-04-19 LAB — GLUCOSE, CAPILLARY: Glucose-Capillary: 140 mg/dL — ABNORMAL HIGH (ref 70–99)

## 2013-04-19 MED ORDER — CALCIUM CARBONATE 1250 (500 CA) MG PO TABS
1.0000 | ORAL_TABLET | Freq: Two times a day (BID) | ORAL | Status: DC
  Administered 2013-04-19 – 2013-04-28 (×18): 500 mg via ORAL
  Filled 2013-04-19 (×20): qty 1

## 2013-04-19 MED ORDER — IOHEXOL 350 MG/ML SOLN
100.0000 mL | Freq: Once | INTRAVENOUS | Status: AC | PRN
  Administered 2013-04-19: 100 mL via INTRAVENOUS

## 2013-04-19 MED ORDER — SODIUM CHLORIDE 0.9 % IV SOLN
1.0000 g | Freq: Once | INTRAVENOUS | Status: AC
  Administered 2013-04-19: 1 g via INTRAVENOUS
  Filled 2013-04-19: qty 10

## 2013-04-19 MED ORDER — ADULT MULTIVITAMIN W/MINERALS CH
1.0000 | ORAL_TABLET | Freq: Every day | ORAL | Status: DC
  Administered 2013-04-19 – 2013-04-28 (×10): 1 via ORAL
  Filled 2013-04-19 (×10): qty 1

## 2013-04-19 MED ORDER — ENOXAPARIN SODIUM 80 MG/0.8ML ~~LOC~~ SOLN
70.0000 mg | Freq: Two times a day (BID) | SUBCUTANEOUS | Status: DC
  Administered 2013-04-19 – 2013-04-24 (×10): 70 mg via SUBCUTANEOUS
  Filled 2013-04-19 (×12): qty 0.8

## 2013-04-19 MED ORDER — POTASSIUM CHLORIDE CRYS ER 20 MEQ PO TBCR
40.0000 meq | EXTENDED_RELEASE_TABLET | Freq: Two times a day (BID) | ORAL | Status: AC
  Administered 2013-04-19 (×2): 40 meq via ORAL
  Filled 2013-04-19 (×2): qty 2

## 2013-04-19 MED ORDER — GLUCERNA SHAKE PO LIQD
237.0000 mL | Freq: Three times a day (TID) | ORAL | Status: DC
  Administered 2013-04-19 – 2013-04-25 (×11): 237 mL via ORAL
  Filled 2013-04-19 (×23): qty 237

## 2013-04-19 MED ORDER — POTASSIUM CHLORIDE IN NACL 40-0.9 MEQ/L-% IV SOLN
INTRAVENOUS | Status: DC
  Administered 2013-04-19: 12:00:00 via INTRAVENOUS
  Filled 2013-04-19 (×4): qty 1000

## 2013-04-19 NOTE — Progress Notes (Signed)
Right upper extremity venous duplex:  Positive for DVT in the axillary vein with superficial thrombus in the cephalic vein at the elbow.

## 2013-04-19 NOTE — Progress Notes (Signed)
INITIAL NUTRITION ASSESSMENT  DOCUMENTATION CODES Per approved criteria  -Not Applicable   INTERVENTION: Provide Glucerna Shakes TID Provide Multivitamin with minerals  NUTRITION DIAGNOSIS: Inadequate oral intake related to poor appetite as evidenced by pt eating 10% of meals and 6% wt loss in less than 2 weeks.  Goal: Pt to meet >/= 90% of their estimated nutrition needs   Monitor:  Po intake Weight Labs  Reason for Assessment: Consult  62 y.o. male  Admitting Dx: Chest pain  ASSESSMENT: 62 y.o. male with history of metastatic prostate cancer, spinal mets including thoracic spine bony metastatic disease and other medical conditions including diabetes; recently discharged [name] to [facility] inpatient rehabilitation and presents with complaints of worsening right sided chest and right upper extremity pain. Pt reports he was eating well PTA with 2-3 meals daily but, he has had a poor appetite since admission and only ate a few bites at each meal today. Pt reports his usual body weight is 168 lbs- 6% wt loss in less than 2 weeks. Pt states he does not know why he lost weight- he reports eating well PTA. Will provide Glucerna Shakes until PO intake improves.   Height: Ht Readings from Last 1 Encounters:  04/18/13 5' 7.5" (1.715 m)    Weight: Wt Readings from Last 1 Encounters:  04/18/13 158 lb 1.6 oz (71.714 kg)    Ideal Body Weight: 150 lbs  % Ideal Body Weight: 105%  Wt Readings from Last 10 Encounters:  04/18/13 158 lb 1.6 oz (71.714 kg)  04/05/13 169 lb 15.6 oz (77.1 kg)  04/07/13 169 lb (76.658 kg)  04/05/13 164 lb 3.2 oz (74.481 kg)  04/05/13 164 lb 3.2 oz (74.481 kg)  02/28/13 171 lb 1.6 oz (77.61 kg)  02/07/13 174 lb 14.4 oz (79.334 kg)  01/17/13 170 lb 14.4 oz (77.52 kg)  12/27/12 170 lb 1.6 oz (77.157 kg)  12/06/12 177 lb 8 oz (80.513 kg)    Usual Body Weight: 168 lbs  % Usual Body Weight: 94%  BMI:  Body mass index is 24.38 kg/(m^2).  Estimated  Nutritional Needs: Kcal: 1800-2000 Protein: 100-110 grams Fluid: 2.1 L/day  Skin: non-pitting LUE and LLE edema, +1 RLE edema; pressure ulcer on coccyx and buttocks, incision on back  Diet Order: Carb Control  EDUCATION NEEDS: -No education needs identified at this time   Intake/Output Summary (Last 24 hours) at 04/19/13 1440 Last data filed at 04/19/13 1355  Gross per 24 hour  Intake 1094.17 ml  Output      0 ml  Net 1094.17 ml    Last BM: 9/9  Labs:   Recent Labs Lab 04/19/13 04/19/13 0625 04/19/13 0840  NA 114* 116* 123*  K 3.5 3.6 2.5*  CL 83* 86* 98  CO2 23 24 18*  BUN 29* 31* 25*  CREATININE 0.43* 0.41* 0.29*  CALCIUM 8.4 8.1* 6.0*  MG  --   --  1.4*  GLUCOSE 124* 104* 96    CBG (last 3)   Recent Labs  04/18/13 1741 04/18/13 2129 04/19/13 0737  GLUCAP 210* 159* 114*    Scheduled Meds: . calcium carbonate  1 tablet Oral BID WC  . dexamethasone  4 mg Oral QID  . doxazosin  8 mg Oral QHS  . enoxaparin (LOVENOX) injection  70 mg Subcutaneous Q12H  . [START ON 04/20/2013] fentaNYL  100 mcg Transdermal Q72H  . insulin aspart  0-20 Units Subcutaneous TID WC  . insulin aspart protamine- aspart  28 Units  Subcutaneous Q supper  . pantoprazole  40 mg Oral QHS  . potassium chloride  40 mEq Oral BID  . senna  1 tablet Oral BID    Continuous Infusions: . 0.9 % NaCl with KCl 40 mEq / L 100 mL/hr at 04/19/13 1131    Past Medical History  Diagnosis Date  . Diabetes mellitus   . Hypertension   . Prostate ca 6/07    prostate ca dx  . Allergy   . Anemia   . CAD (coronary artery disease)   . Blood transfusion without reported diagnosis     May 2013  . History of radiation therapy 09/19/12-10/10/2012    T-7-T9,35Gy/79fx,Prescaral mass 35Gy/42fx  . History of radiation therapy 05/2006-06/2006    salvage prostate rad tx 68.4Gy  . Bone cancer     prostate ca with mets to spine    Past Surgical History  Procedure Laterality Date  . Prostatectomy   03/03/06    radical,gleason 4+5=9,Adenocarcinoma  . Colonoscopy w/ polypectomy  03/01/01    hyperplastic,no adenomatous change or malignancy  . Portacath placement      right  . Laminectomy N/A 03/14/2013    Procedure: Thoracic six-seven laminectomy, resection of tumor. Lumbar five laminectomy and resection of tumor;  Surgeon: Temple Pacini, MD;  Location: [facility];  Service: Neurosurgery;  Laterality: N/A;  . Laminectomy Bilateral 03/30/2013    Procedure: THORACIC LAMINECTOMY FOR TUMOR;  Surgeon: Temple Pacini, MD;  Location: [facility];  Service: Neurosurgery;  Laterality: Bilateral;  Thoracic Four-Six Laminectomy for Tumor    Ian Malkin RD, LDN Inpatient Clinical Dietitian Pager: [phone] After Hours Pager: [phone]

## 2013-04-19 NOTE — Progress Notes (Signed)
CARE MANAGEMENT NOTE 04/19/2013  Patient:  Andrew Nelson, Andrew Nelson   Account Number:  [id number]  Date Initiated:  04/19/2013  Documentation initiated by:  Lakiesha Ralphs  Subjective/Objective Assessment:   pt with hx of metastatic prostate ca now with low hgb, wbc, and na 113.  pt has been cir at [facility] and was transferred here on [id number] due to decrease in condition.     Action/Plan:   [facility] or home with hospice   Anticipated DC Date:  04/22/2013   Anticipated DC Plan:  HOME W HOSPICE CARE  In-house referral  Clinical Social Worker      DC Planning Services  CM consult      Choice offered to / List presented to:             Status of service:  In process, will continue to follow Medicare Important Message given?  NA - LOS <3 / Initial given by admissions (If response is "NO", the following Medicare IM given date fields will be blank) Date Medicare IM given:   Date Additional Medicare IM given:    Discharge Disposition:    Per UR Regulation:  Reviewed for med. necessity/level of care/duration of stay  If discussed at Long Length of Stay Meetings, dates discussed:    Comments:  [id number]/Annalise Mcdiarmid Stark Jock, BSN, [place] [phone] Chart Reviewed for discharge and hospital needs. Discharge needs at time of review:  None patient may need hospice at home or even [facility] depending on his progress. Review of patient progress due on [id number].

## 2013-04-19 NOTE — Progress Notes (Signed)
ANTICOAGULATION CONSULT NOTE - Initial Consult  Pharmacy Consult for Lovenox Indication: RUE DVT  Allergies  Allergen Reactions  . Aprindine Other (See Comments)    unknown  . Bc Powder [Aspirin-Salicylamide-Caffeine] Hives  . Penicillins Hives    Patient Measurements: Height: 5' 7.5" (171.5 cm) Weight: 158 lb 1.6 oz (71.714 kg) IBW/kg (Calculated) : 67.25  Vital Signs: Temp: 98 F (36.7 C) (09/10 0800) Temp src: Oral (09/10 0800) BP: 108/50 mmHg (09/10 0800) Pulse Rate: 103 (09/10 0800)  Labs:  Recent Labs  04/18/13 1859 04/18/13 2345 04/19/13 04/19/13 0625 04/19/13 0840  HGB 8.6*  --   --  7.8* 6.6*  HCT 24.7*  --   --  22.5* 18.8*  PLT 49* 49*  --  45* 35*  APTT  --  62*  --   --   --   LABPROT  --  13.8  --   --   --   INR  --  1.08  --   --   --   CREATININE 0.39*  --  0.43* 0.41* 0.29*  TROPONINI <0.30  --  <0.30  --   --     Estimated Creatinine Clearance: 90 ml/min (by C-G formula based on Cr of 0.29).   Medical History: Past Medical History  Diagnosis Date  . Diabetes mellitus   . Hypertension   . Prostate ca 6/07    prostate ca dx  . Allergy   . Anemia   . CAD (coronary artery disease)   . Blood transfusion without reported diagnosis     May 2013  . History of radiation therapy 09/19/12-10/10/2012    T-7-T9,35Gy/65fx,Prescaral mass 35Gy/42fx  . History of radiation therapy 05/2006-06/2006    salvage prostate rad tx 68.4Gy  . Bone cancer     prostate ca with mets to spine    Medications:  Scheduled:  . calcium carbonate  1 tablet Oral BID WC  . dexamethasone  4 mg Oral QID  . doxazosin  8 mg Oral QHS  . [START ON 04/20/2013] fentaNYL  100 mcg Transdermal Q72H  . insulin aspart  0-20 Units Subcutaneous TID WC  . insulin aspart protamine- aspart  28 Units Subcutaneous Q supper  . pantoprazole  40 mg Oral QHS  . potassium chloride  40 mEq Oral BID  . senna  1 tablet Oral BID   Infusions:  . 0.9 % NaCl with KCl 40 mEq / L 100 mL/hr at  04/19/13 1131    Assessment: 62 y.o. male with metastatic castrate-resistant prostate cancer. Dopplers reveal DVT in axillary vein with superficial thrombus in the cephalic vein at the elbow. Lovenox per pharmacy protocol ordered.  Weight ~72kg  SCr 0.29, CrCl 90 ml/min  Thrombocytopenic, pltc decreased 35k today. Per Oncology, etiology is likely related to malignancy and wishes to proceed with lovenox and monitor closely.  Patient has been receiving sq heparin during admission since 8/27, last dose was 9/9 at 10pm  Hgb down 6.6 - 3 units PRBC ordered   Goal of Therapy:  Anti-Xa level 0.6-1.2 units/ml 4hrs after LMWH dose given Monitor platelets by anticoagulation protocol: Yes   Plan:   Begin Lovenox 1mg /kg sq q12h  Monitor renal function  Follow CBC and monitor for signs/symptoms of bleeding - none currently per RN  Loralee Pacas, PharmD, BCPS Pager: [phone] 04/19/2013,12:49 PM

## 2013-04-19 NOTE — Progress Notes (Addendum)
[facility] PROGRESS NOTE  Andrew Nelson WUJ:[id number] DOB: February 02, 1951 DOA: 04/18/2013 PCP: [name], MD  Brief narrative: Andrew Nelson is an 62 y.o. male with a PMH of metastatic castration resistant prostate cancer, status post radiation therapy and chemotherapy with Jevtana, status post T6-T7 laminectomy and resection of epidural tumor, left L5 decompressive hemilaminectomy with resection of epidural tumor on 03/13/2013 who was admitted on 03/29/2013 with progressively worsening lower extremity weakness. 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 He then had further radiation therapy and was ultimately discharged to CIR where he completed rehabilitation and was discharged from rehabilitation on 04/18/2013 secondary to worsening right-sided chest and upper extremity pain impairing his ability to participate in rehabilitation.  Assessment/Plan: Principal Problem:   Chest pain and right upper extremity pain in a patient with metastatic prostate cancer / elevated d-dimer -Likely secondary to thoracic epidural/extraosseous tumor is noted on MRI done 03/30/2013. -Continue dexamethasone. -Followup radiation oncology consultation. -Troponins negative x2. -Continue pain management with fentanyl and when necessary oxycodone and IV dilaudid. -Doppler right upper extremity rule out DVT. Check CT of the chest rule out pulmonary embolism given tachycardia. Active Problems:   Hypocalcemia -We'll give a dose of calcium gluconate now and place on oral calcium supplementation.   Hypokalemia -Replace IV/by mouth and check magnesium.   Pancytopenia -Unclear etiology. I do not see that he has had chemotherapy any time recently. We'll discuss with [name].   Hyponatremia -F/U urine osmolality, random urine sodium. -Continue  normal saline infusion.   DM w/o Complication Type I -Continue 70/30, 28 units every afternoon and SSI, insulin resistant scale before meals. -CBG is 148-210.   PROSTATE CANCER, HX OF -Will let [name] know of patient's admission.   Hypertension -Antihypertensives on hold. Blood pressure currently controlled.   Metastasis of neoplasm to spinal canal -Currently undergoing radiation treatments to T1 and T5. -Continue Decadron.   Anemia of chronic disease -Monitor hemoglobin and transfuse for hemoglobin less than 7 mg/dL.   Protein-calorie malnutrition, moderate -Likely from cancer related cachexia. Dietitian consultation requested.  Code Status: Full. Family Communication: No family at bedside. Disposition Plan: SNF versus CIR.   Medical Consultants:  [name], Oncology.  [name], Radiation Oncology.  Other Consultants:  None.  Anti-infectives:  None.  HPI/Subjective: [name] tells me that his chest and right shoulder pain or bit better today. Denies dyspnea.  Objective: Filed Vitals:   04/18/13 2312 04/19/13 0000 04/19/13 0400 04/19/13 0800  BP: 130/60 96/58 92/53  108/50  Pulse: 113 109 92 103  Temp: 98.9 F (37.2 C) 98.9 F (37.2 C)    TempSrc: Oral Oral    Resp: 14 14 14 18   Height:      Weight:      SpO2: 97% 99% 98% 98%    Intake/Output Summary (Last 24 hours) at 04/19/13 0955 Last data filed at 04/19/13 0800  Gross per 24 hour  Intake 1081.67 ml  Output      0 ml  Net 1081.67 ml    Exam: Gen:  NAD Cardiovascular:  Tachycardic Respiratory:  Lungs CTAB Gastrointestinal:  Abdomen soft with distended, nontender, + BS Extremities: No pedal edema, right upper extremity swollen  Data Reviewed: Basic Metabolic Panel:  Recent Labs Lab 04/18/13 1859 04/19/13 04/19/13 0625 04/19/13 0840  NA 113* 114* 116* 123*  K 3.6 3.5  3.6 2.5*  CL 82* 83* 86* 98  CO2 20 23 24  18*  GLUCOSE 225* 124* 104* 96  BUN 29* 29* 31*  25*  CREATININE 0.39* 0.43* 0.41* 0.29*  CALCIUM 8.3* 8.4 8.1* 6.0*   GFR Estimated Creatinine Clearance: 90 ml/min (by C-G formula based on Cr of 0.29). Liver Function Tests:  Recent Labs Lab 04/18/13 1859  AST 41*  ALT 28  ALKPHOS 87  BILITOT 0.5  PROT 4.8*  ALBUMIN 2.3*   Coagulation profile  Recent Labs Lab 04/18/13 2345  INR 1.08    CBC:  Recent Labs Lab 04/18/13 1859 04/18/13 2345 04/19/13 0625 04/19/13 0840  WBC 1.8*  --  1.5* 1.2*  HGB 8.6*  --  7.8* 6.6*  HCT 24.7*  --  22.5* 18.8*  MCV 77.9*  --  78.1 77.4*  PLT 49* 49* 45* 35*   Cardiac Enzymes:  Recent Labs Lab 04/18/13 1859 04/19/13  TROPONINI <0.30 <0.30   CBG:  Recent Labs Lab 04/18/13 0759 04/18/13 1123 04/18/13 1741 04/18/13 2129 04/19/13 0737  GLUCAP 176* 174* 210* 159* 114*   D-Dimer  Recent Labs  04/18/13 2345  DDIMER 2.04*   Microbiology Recent Results (from the past 240 hour(s))  MRSA PCR SCREENING     Status: None   Collection Time    04/18/13 11:00 PM      Result Value Range Status   MRSA by PCR NEGATIVE  NEGATIVE Final   Comment:            The GeneXpert MRSA Assay (FDA     approved for NASAL specimens     only), is one component of a     comprehensive MRSA colonization     surveillance program. It is not     intended to diagnose MRSA     infection nor to guide or     monitor treatment for     MRSA infections.     Procedures and Diagnostic Studies: None.   Scheduled Meds: . dexamethasone  4 mg Oral QID  . doxazosin  8 mg Oral QHS  . [START ON 04/20/2013] fentaNYL  100 mcg Transdermal Q72H  . insulin aspart  0-20 Units Subcutaneous TID WC  . insulin aspart protamine- aspart  28 Units Subcutaneous Q supper  . pantoprazole  40 mg Oral QHS  . senna  1 tablet Oral BID   Continuous Infusions: . sodium chloride 100 mL/hr at 04/18/13 2335    Time spent: 35 minutes with greater than 50% of time spent discussing current test results, clinical  impression and plan of care.   LOS: 1 day   RAMA,CHRISTINA  [facility] Pager [phone].   *Please note that the hospitalists switch teams on Wednesdays. Please call the flow manager at [phone] if you are having difficulty reaching the hospitalist taking care of this patient as she can update you and provide the most up-to-date pager number of provider caring for the patient. If 8PM-8AM, please contact night-coverage at [email or web address], password [facility]  04/19/2013, 9:55 AM

## 2013-04-19 NOTE — Progress Notes (Signed)
IP PROGRESS NOTE  Subjective:   Patient well-known to me with history of advanced prostate cancer status post multiple treatments he was currently in rehabilitation from his recent spinal surgery and was admitted with increased arm and chest pain and found to have DVT and been evaluated for possible pulmonary embolus.  Clinically, he appears to be extremely fatigued and tired. He was able to participate in rehabilitation as much. He was getting radiation therapy on Monday and Tuesday and felt that he is energy have been zapped completely. Is not reporting any chest pain or shortness of breath is not reporting any abdominal pain or discomfort. Is not reporting any active bleeding.  Objective:  Vital signs in last 24 hours: Temp:  [98 F (36.7 C)-98.9 F (37.2 C)] 98 F (36.7 C) (09/10 0800) Pulse Rate:  [92-113] 103 (09/10 0800) Resp:  [14-20] 18 (09/10 0800) BP: (92-130)/(50-71) 108/50 mmHg (09/10 0800) SpO2:  [97 %-100 %] 98 % (09/10 0800) Weight:  [158 lb 1.6 oz (71.714 kg)] 158 lb 1.6 oz (71.714 kg) (09/09 1650) Weight change:  Last BM Date: 04/18/13  Intake/Output from previous day: 09/09 0701 - 09/10 0700 In: 981.7 [P.O.:240; I.V.:741.7] Out: -   Mouth: mucous membranes moist, pharynx normal without lesions Resp: clear to auscultation bilaterally Cardio: regular rate and rhythm, S1, S2 normal, no murmur, click, rub or gallop GI: soft, non-tender; bowel sounds normal; no masses,  no organomegaly Extremities: extremities normal, atraumatic, no cyanosis or edema  Portacath/PICC-without erythema  Lab Results:  Recent Labs  04/19/13 0625 04/19/13 0840  WBC 1.5* 1.2*  HGB 7.8* 6.6*  HCT 22.5* 18.8*  PLT 45* 35*    BMET  Recent Labs  04/19/13 0625 04/19/13 0840  NA 116* 123*  K 3.6 2.5*  CL 86* 98  CO2 24 18*  GLUCOSE 104* 96  BUN 31* 25*  CREATININE 0.41* 0.29*  CALCIUM 8.1* 6.0*     Medications: I have reviewed the patient's current  medications.  Assessment/Plan:  62 year old with the following issues:  1. Advanced prostate cancer status post multiple treatments currently receiving radiation therapy to his multiple spinal lesions. I have had multiple discussions with Mr. Happ discussing the natural course of this disease especially in his situation was rapidly progressing cancer despite multiple treatments. I discussed with him that his prognosis is poor and the likelihood we will be able to palliate at his diminishing every day. Despite all that, like to continue be aggressive in an effort to restore some quality of life. I fear with this current episode of illness, it my be become more and more difficult. It is reasonable to consult palliative care services to assist in his goals of care. I think it would be difficult to imagine him getting any more chemotherapy in the future.  2. DVT and a possible pulmonary embolus: I agree with Lovenox treatment despite the thrombocytopenia giving the increased risk of mortality associated with VTEs. Weight and monitor his coagulation labs closely.  3. Pancytopenia: Likely related to cancer involvement in his bone marrow which really a poor prognostic feature.  I will continue to follow and assist with Mr. Boughner care.   LOS: 1 day   [facility] 04/19/2013, 12:43 PM

## 2013-04-19 NOTE — Progress Notes (Signed)
Patient ZO:XWRUEA L Carey      DOB: April 01, 1951      VWU:[id number]  Patient resting comfortably. No new complaints.  Pain control adequate. Will continue to support and help work through goals of care.   Willisha Sligar L. Ladona Ridgel, MD MBA The Palliative Medicine Team at [facility] Phone: [phone] Pager: [phone]

## 2013-04-20 ENCOUNTER — Ambulatory Visit
Admit: 2013-04-20 | Discharge: 2013-04-20 | Disposition: A | Discharge status: Home / Self Care | Payer: BC Managed Care – PPO | Attending: Radiation Oncology | Admitting: Radiation Oncology

## 2013-04-20 DIAGNOSIS — I82409 Acute embolism and thrombosis of unspecified deep veins of unspecified lower extremity: Secondary | ICD-10-CM | POA: Diagnosis present

## 2013-04-20 LAB — CBC
Hemoglobin: 10 g/dL — ABNORMAL LOW (ref 13.0–17.0)
MCH: 27.5 pg (ref 26.0–34.0)
MCHC: 35.2 g/dL (ref 30.0–36.0)
MCV: 78.2 fL (ref 78.0–100.0)

## 2013-04-20 LAB — BASIC METABOLIC PANEL
BUN: 23 mg/dL (ref 6–23)
GFR calc non Af Amer: >90 mL/min (ref >90)
Glucose, Bld: 115 mg/dL — ABNORMAL HIGH (ref 70–99)
Potassium: 5.1 mEq/L (ref 3.5–5.1)

## 2013-04-20 LAB — GLUCOSE, CAPILLARY: Glucose-Capillary: 113 mg/dL — ABNORMAL HIGH (ref 70–99)

## 2013-04-20 MED ORDER — SODIUM CHLORIDE 0.9 % IV SOLN
INTRAVENOUS | Status: DC
  Administered 2013-04-20 – 2013-04-24 (×2): 20 mL/h via INTRAVENOUS
  Administered 2013-04-26: 09:00:00 via INTRAVENOUS

## 2013-04-20 MED ORDER — DEMECLOCYCLINE HCL 150 MG PO TABS
300.0000 mg | ORAL_TABLET | Freq: Two times a day (BID) | ORAL | Status: DC
  Administered 2013-04-20 – 2013-04-22 (×5): 300 mg via ORAL
  Filled 2013-04-20 (×6): qty 2

## 2013-04-20 MED ORDER — MAGNESIUM OXIDE 400 (241.3 MG) MG PO TABS
400.0000 mg | ORAL_TABLET | Freq: Two times a day (BID) | ORAL | Status: DC
  Administered 2013-04-20 – 2013-04-22 (×5): 400 mg via ORAL
  Filled 2013-04-20 (×8): qty 1

## 2013-04-20 NOTE — Consult Note (Signed)
Patient Andrew Nelson      DOB: 18-Aug-1950      GUY:[id number]     Consult Note from the Palliative Medicine Team at [facility]    Consult Requested by: Dr. Riley Kill     PCP: [name], MD Reason for Consultation:GOC,     Phone [phone] Related symptom recommendations. Assessment of patients Current state: 62 yr old african [place] male with known metastatic prostate cancer with cord involvement being treated with radiation and in rehab for generalized deficits related to cord involvement.  Patient expresses his currently understanding that while his cancer can not be cured he at least wants to maximize his function in the time he has left with a goal of trying to get home.  He allowed me to talk about hospice but was not interested in focusing on that at this time.  His wife used hospice for her mother.  The patient agreed to assess is advanced directives with his wife since when they drafted them he did not have metastatic cancer.  He is clear that he desires full code status at this time.  We talked about his pain meds.  He was hesitant to consider narrowing down to just morphine with his fentanyl patch.  He told me morphine has been working the best but continues to use both diluadid and oxycodone as prns.  I expressed to him it might be better to narrow down to just morphine and titrate for better effect.  Currently his sites of pain remain is right arm and hand (numbness) and his back coming through to his central chest which has been present for several days now.   He is open to letting us following along and help talk through his goals of care.    Goals of Care: 1.  Code Status: Full Code   2. Scope of Treatment: Patient desires full scope of antibiotics, treatment for pain, radiation, treatment for his chronic medical conditions.   4. Disposition: hopeful to go home with some form of help.  spoke with him about   3. Symptom Management: 1. Pain : patient  prefers ro transition to [place] and reconsider current pain regime there. He is hesitant to let go of oxycodone even though he states it doesn't do much.  i suggested trying to narrow meds to one agent that he could optimize.  Patient only able to have oral medications on rehab unit. May need IV medications initially to get his pain better controlled.  4. Psychosocial:married. His wife is very supportive they are of one mind.  He is realistic about his illness but needs to be sure it is the right time to shift his focus to comfort. They have two children.  5. Spiritual:faith is very important to him.  He is trying to balance is belief in an healing with the realities of nit being healed.        Patient Documents Completed or Given: Document Given Completed  Advanced Directives Pkt    MOST    DNR    Gone from My Sight    Hard Choices      Brief HPI: 62 yr old african [place] male with known metastatic prostate cancer diagnosed in 2007.  Treatment over the course of the years has kept it contained but recently noted to have worsening mets in particular to the spine now with neurologic symptoms. He is getting radiation, but was continuing to have pain in right greater than left arm, shoulder and back with  radiation to the sternum. We were asked to assist with goals of care and related symptom recommendations.   ROS:   Persistent     PMH:  Past Medical History  Diagnosis Date  . Diabetes mellitus   . Hypertension   . Prostate ca 6/07    prostate ca dx  . Allergy   . Anemia   . CAD (coronary artery disease)   . Blood transfusion without reported diagnosis     May 2013  . History of radiation therapy 09/19/12-10/10/2012    T-7-T9,35Gy/69fx,Prescaral mass 35Gy/29fx  . History of radiation therapy 05/2006-06/2006    salvage prostate rad tx 68.4Gy  . Bone cancer     prostate ca with mets to spine     PSH: Past Surgical History  Procedure Laterality Date  . Prostatectomy   03/03/06    radical,gleason 4+5=9,Adenocarcinoma  . Colonoscopy w/ polypectomy  03/01/01    hyperplastic,no adenomatous change or malignancy  . Portacath placement      right  . Laminectomy N/A 03/14/2013    Procedure: Thoracic six-seven laminectomy, resection of tumor. Lumbar five laminectomy and resection of tumor;  Surgeon: Temple Pacini, MD;  Location: [facility];  Service: Neurosurgery;  Laterality: N/A;  . Laminectomy Bilateral 03/30/2013    Procedure: THORACIC LAMINECTOMY FOR TUMOR;  Surgeon: Temple Pacini, MD;  Location: [facility];  Service: Neurosurgery;  Laterality: Bilateral;  Thoracic Four-Six Laminectomy for Tumor   I have reviewed the FH and SH and  If appropriate update it with new information. Allergies  Allergen Reactions  . Aprindine Other (See Comments)    unknown  . Bc Powder [Aspirin-Salicylamide-Caffeine] Hives  . Penicillins Hives   Scheduled Meds: . calcium carbonate  1 tablet Oral BID WC  . dexamethasone  4 mg Oral QID  . doxazosin  8 mg Oral QHS  . enoxaparin (LOVENOX) injection  70 mg Subcutaneous Q12H  . feeding supplement  237 mL Oral TID BM  . [START ON 04/20/2013] fentaNYL  100 mcg Transdermal Q72H  . insulin aspart  0-20 Units Subcutaneous TID WC  . insulin aspart protamine- aspart  28 Units Subcutaneous Q supper  . multivitamin with minerals  1 tablet Oral Daily  . pantoprazole  40 mg Oral QHS  . potassium chloride  40 mEq Oral BID  . senna  1 tablet Oral BID   Continuous Infusions: . 0.9 % NaCl with KCl 40 mEq / L 100 mL/hr at 04/19/13 1131   PRN Meds:.diazepam, HYDROmorphone (DILAUDID) po, lidocaine-prilocaine, ondansetron (ZOFRAN) IV, ondansetron, oxyCODONE, polyethylene glycol    BP 140/65  Pulse 91  Temp(Src) 98.1 F (36.7 C) (Oral)  Resp 17  Ht 5' 7.5" (1.715 m)  Wt 71.714 kg (158 lb 1.6 oz)  BMI 24.38 kg/m2  SpO2 100%   PPS: 40-50%   Intake/Output Summary (Last 24 hours) at 04/19/13 1650 Last data filed at 04/19/13 1622   Gross per 24 hour  Intake   1780 ml  Output      0 ml  Net   1780 ml   LBM: 04/18/2013  Physical Exam:  General: No acute distress, awake alert, oriented and with capacity for decision making HEENT: PERRL, EOMI,anicteric, mm dry Chest:   Decreased but clear,  CVS: regular rate and rhythm S1, S2 Abdomen:soft not distended, positve bowel sounds, multiple areas of ecchymosis secondary to heparin shots. Ext: right hand somewhat swollen, able to lift with some effort but unable to grip Neuro: awake alert, fully  oriented CNII-XII appear to be intact.  No new lab  MRI Spine: Progressed epidural tumor at the L1 level emanating from the  right pedicle/ posterior elements with new spinal stenosis  corresponding to the level of the conus medullaris. No conus signal  abnormality.  2. Increased spinal stenosis at L3, mild and related to both dorsal  epidural tumor and epidural lipomatosis.  3. Mild progression of epidural and foraminal tumor centered at the  L5 level. Spinal stenosis at L5 appears increased and severe despite  interval left laminectomy changes at this level.  I spoke to Dr. Elisabeth Pigeon on the [facility] hospitalist team at the  time of this report. She advised that she had already discussed this  exam with Dr. Jordan Likes of neurosurgery who is advising transfer of the  patient to [facility] for an emergent surgical spine  decompression.     Time In Time Out Total Time Spent with Patient Total Overall Time  230 pm 330 pm  60 min 60 min    Greater than 50%  of this time was spent counseling and coordinating care related to the above assessment and plan.    Queen Abbett L. Ladona Ridgel, MD MBA The Palliative Medicine Team at [facility] Phone: [phone] Pager: [phone]

## 2013-04-20 NOTE — Progress Notes (Signed)
[facility] PROGRESS NOTE  Andrew Nelson YNW:[id number] DOB: Mar 13, 1951 DOA: 04/18/2013 PCP: [name], MD  Brief narrative: Andrew Nelson is an 62 y.o. male with a PMH of metastatic castration resistant prostate cancer, status post radiation therapy and chemotherapy with Jevtana, status post T6-T7 laminectomy and resection of epidural tumor, left L5 decompressive hemilaminectomy with resection of epidural tumor on 03/13/2013 who was admitted on 03/29/2013 with progressively worsening lower extremity weakness. 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 He then had further radiation therapy and was ultimately discharged to CIR where he completed rehabilitation and was discharged from rehabilitation on 04/18/2013 secondary to worsening right-sided chest and upper extremity pain impairing his ability to participate in rehabilitation.  RUE confirmed DVT.  Assessment/Plan: Principal Problem:   Chest pain and right upper extremity pain in a patient with metastatic prostate cancer / right upper extremity DVT -Likely secondary to thoracic epidural/extraosseous tumor is noted on MRI done 03/30/2013. -Continue dexamethasone. -Followup radiation oncology consultation. -Troponins negative x2. -Continue pain management with fentanyl and when necessary oxycodone and IV dilaudid. -Doppler right upper extremity positive for DVT. CT angiogram negative for pulmonary embolism. -Lovenox therapy initiated on 04/19/2013. Active Problems:   Hypocalcemia -Resolved status post calcium gluconate 04/19/2013. Continue calcium carbonate supplementation.   Hypokalemia / hypomagnesemia -Monitor and replace electrolytes as needed.   Pancytopenia -Likely related to cancer involvement of his bone marrow per oncologist. -Watch platelet count closely  since patient is on Lovenox for treatment of DVT.   Hyponatremia -Urine studies consistent with SIADH. -Continue normal saline infusion. -Start demeclocycline and place on a fluid restriction.   DM w/o Complication Type I -Continue 70/30, 28 units every afternoon and SSI, insulin resistant scale before meals. -CBG is 114-159.   PROSTATE CANCER, HX OF -Seen by [name] 04/19/2013. Not currently a candidate for further chemotherapy. -Seen by the palliative care team, continues to want aggressive treatment.   Hypertension -Antihypertensives on hold. Blood pressure currently controlled.   Metastasis of neoplasm to spinal canal -Currently undergoing radiation treatments to T1 and T5. -Continue Decadron.   Anemia of chronic disease -Monitor hemoglobin and transfuse for hemoglobin less than 7 mg/dL. -Status post 1 unit of packed red blood cells 04/19/2013.   Protein-calorie malnutrition, moderate -Likely from cancer related cachexia. Seen by dietitian on 04/19/2013. Continue Glucerna shakes and multivitamins.  Code Status: Full. Family Communication: Wife updated at bedside. Disposition Plan: SNF versus back to CIR.   Medical Consultants:  [name], Oncology.  [name], Radiation Oncology.  [name], Palliative care  Other Consultants:  Dietitian  Anti-infectives:  None.  HPI/Subjective: [name] tells me that his chest and right shoulder pain have improved. Denies dyspnea. Bowels moved yesterday. No nausea or vomiting. Low-grade fevers noted to 99.2.  Objective: Filed Vitals:   04/19/13 2000 04/20/13 0000 04/20/13 0400 04/20/13 0500  BP: 121/56 114/59 133/55   Pulse: 99 98 88   Temp: 99.2 F (37.3 C) 97 F (36.1 C) 98.6 F (37 C)   TempSrc: Oral Oral Oral   Resp: 20 9 14    Height:      Weight:    73.5 kg (162 lb 0.6 oz)  SpO2: 100% 99% 99%     Intake/Output Summary (Last 24 hours) at 04/20/13 0659 Last data filed at 04/20/13  0500  Gross per 24 hour  Intake 2098.33 ml  Output   1325 ml  Net 773.33 ml    Exam: Gen:  NAD Cardiovascular:  Regular rate, and rhythm. Respiratory:  Lungs CTAB Gastrointestinal:  Abdomen soft with distended, nontender, + BS Extremities: No pedal edema, right upper extremity swollen  Data Reviewed: Basic Metabolic Panel:  Recent Labs Lab 04/18/13 1859 04/19/13 04/19/13 0625 04/19/13 0840 04/20/13 0510  NA 113* 114* 116* 123* 117*  K 3.6 3.5 3.6 2.5* 5.1  CL 82* 83* 86* 98 90*  CO2 20 23 24  18* 21  GLUCOSE 225* 124* 104* 96 115*  BUN 29* 29* 31* 25* 23  CREATININE 0.39* 0.43* 0.41* 0.29* 0.34*  CALCIUM 8.3* 8.4 8.1* 6.0* 8.4  MG  --   --   --  1.4*  --    GFR Estimated Creatinine Clearance: 90 ml/min (by C-G formula based on Cr of 0.34). Liver Function Tests:  Recent Labs Lab 04/18/13 1859  AST 41*  ALT 28  ALKPHOS 87  BILITOT 0.5  PROT 4.8*  ALBUMIN 2.3*   Coagulation profile  Recent Labs Lab 04/18/13 2345  INR 1.08    CBC:  Recent Labs Lab 04/18/13 1859 04/18/13 2345 04/19/13 0625 04/19/13 0840 04/19/13 1930 04/20/13 0510  WBC 1.8*  --  1.5* 1.2* 1.5* 1.4*  HGB 8.6*  --  7.8* 6.6* 10.1* 10.0*  HCT 24.7*  --  22.5* 18.8* 28.4* 28.4*  MCV 77.9*  --  78.1 77.4* 77.4* 78.2  PLT 49* 49* 45* 35* 40* 36*   Cardiac Enzymes:  Recent Labs Lab 04/18/13 1859 04/19/13  TROPONINI <0.30 <0.30   CBG:  Recent Labs Lab 04/18/13 2129 04/19/13 0737 04/19/13 1153 04/19/13 1642 04/19/13 2306  GLUCAP 159* 114* 141* 140* 145*   D-Dimer  Recent Labs  04/18/13 2345  DDIMER 2.04*   Microbiology Recent Results (from the past 240 hour(s))  MRSA PCR SCREENING     Status: None   Collection Time    04/18/13 11:00 PM      Result Value Range Status   MRSA by PCR NEGATIVE  NEGATIVE Final   Comment:            The GeneXpert MRSA Assay (FDA     approved for NASAL specimens     only), is one component of a     comprehensive MRSA colonization      surveillance program. It is not     intended to diagnose MRSA     infection nor to guide or     monitor treatment for     MRSA infections.     Procedures and Diagnostic Studies: None.   Scheduled Meds: . calcium carbonate  1 tablet Oral BID WC  . dexamethasone  4 mg Oral QID  . doxazosin  8 mg Oral QHS  . enoxaparin (LOVENOX) injection  70 mg Subcutaneous Q12H  . feeding supplement  237 mL Oral TID BM  . fentaNYL  100 mcg Transdermal Q72H  . insulin aspart  0-20 Units Subcutaneous TID WC  . insulin aspart protamine- aspart  28 Units Subcutaneous Q supper  . multivitamin with minerals  1 tablet Oral Daily  . pantoprazole  40 mg Oral QHS  . senna  1 tablet Oral BID   Continuous Infusions: . 0.9 % NaCl with KCl 40 mEq / L 100 mL/hr at 04/19/13 1131    Time spent: 35 minutes with greater than 50% of time spent discussing current test results, clinical impression and plan of  care with both the patient and his family.   LOS: 2 days   RAMA,CHRISTINA  [facility] Pager [phone].   *Please note that the hospitalists switch teams on Wednesdays. Please call the flow manager at [phone] if you are having difficulty reaching the hospitalist taking care of this patient as she can update you and provide the most up-to-date pager number of provider caring for the patient. If 8PM-8AM, please contact night-coverage at [email or web address], password [facility]  04/20/2013, 6:59 AM

## 2013-04-20 NOTE — Progress Notes (Signed)
IP PROGRESS NOTE  Subjective:   Patient is feeling better. No new complaints.   Objective:  Vital signs in last 24 hours: Temp:  [97 F (36.1 C)-99.2 F (37.3 C)] 97.9 F (36.6 C) (09/11 0800) Pulse Rate:  [88-106] 89 (09/11 0800) Resp:  [9-22] 16 (09/11 0800) BP: (112-140)/(51-65) 132/62 mmHg (09/11 0800) SpO2:  [99 %-100 %] 100 % (09/11 0800) Weight:  [162 lb 0.6 oz (73.5 kg)] 162 lb 0.6 oz (73.5 kg) (09/11 0500) Weight change: 3 lb 15 oz (1.786 kg) Last BM Date: 04/18/13  Intake/Output from previous day: 09/10 0701 - 09/11 0700 In: 1998.3 [I.V.:1648.3; Blood:350] Out: 1325 [Urine:1325]  Mouth: mucous membranes moist, pharynx normal without lesions Resp: clear to auscultation bilaterally Cardio: regular rate and rhythm, S1, S2 normal, no murmur, click, rub or gallop GI: soft, non-tender; bowel sounds normal; no masses,  no organomegaly Extremities: extremities normal, atraumatic, no cyanosis or edema    Lab Results:  Recent Labs  04/19/13 1930 04/20/13 0510  WBC 1.5* 1.4*  HGB 10.1* 10.0*  HCT 28.4* 28.4*  PLT 40* 36*    BMET  Recent Labs  04/19/13 0840 04/20/13 0510  NA 123* 117*  K 2.5* 5.1  CL 98 90*  CO2 18* 21  GLUCOSE 96 115*  BUN 25* 23  CREATININE 0.29* 0.34*  CALCIUM 6.0* 8.4     Medications: I have reviewed the patient's current medications.  Assessment/Plan:  62 year old with the following issues:  1. Advanced prostate cancer status post multiple treatments currently receiving radiation therapy to his multiple spinal lesions. I have had multiple discussions with [name] discussing the natural course of this disease especially in his situation was rapidly progressing cancer despite multiple treatments. Treatments are on hold for now.   2. DVT without a PE on CT scan: I agree with Lovenox treatment despite the thrombocytopenia giving the increased risk of mortality associated with VTEs. No bleeding noted.   3. Pancytopenia:  Likely related to cancer involvement in his bone marrow which really a poor prognostic feature.  I will continue to follow.    LOS: 2 days   Jylan Loeza 04/20/2013, 1:05 PM

## 2013-04-21 ENCOUNTER — Encounter: Payer: Self-pay | Admitting: Radiation Oncology

## 2013-04-21 ENCOUNTER — Ambulatory Visit
Admit: 2013-04-21 | Discharge: 2013-04-21 | Disposition: A | Discharge status: Home / Self Care | Payer: BC Managed Care – PPO | Attending: Radiation Oncology | Admitting: Radiation Oncology

## 2013-04-21 VITALS — BP 131/72 | HR 95 | Resp 16

## 2013-04-21 DIAGNOSIS — C7949 Secondary malignant neoplasm of other parts of nervous system: Secondary | ICD-10-CM

## 2013-04-21 LAB — BASIC METABOLIC PANEL
BUN: 17 mg/dL (ref 6–23)
Chloride: 89 mEq/L — ABNORMAL LOW (ref 96–112)
Creatinine, Ser: 0.33 mg/dL — ABNORMAL LOW (ref 0.50–1.35)
GFR calc non Af Amer: >90 mL/min (ref >90)
Glucose, Bld: 120 mg/dL — ABNORMAL HIGH (ref 70–99)
Potassium: 4.8 mEq/L (ref 3.5–5.1)

## 2013-04-21 LAB — GLUCOSE, CAPILLARY
Glucose-Capillary: 126 mg/dL — ABNORMAL HIGH (ref 70–99)
Glucose-Capillary: 100 mg/dL — ABNORMAL HIGH (ref 70–99)

## 2013-04-21 LAB — CBC
HCT: 27.8 % — ABNORMAL LOW (ref 39.0–52.0)
Hemoglobin: 9.8 g/dL — ABNORMAL LOW (ref 13.0–17.0)
MCH: 27.7 pg (ref 26.0–34.0)
MCHC: 35.3 g/dL (ref 30.0–36.0)

## 2013-04-21 MED ORDER — GUAIFENESIN-DM 100-10 MG/5ML PO SYRP: 5.0000 mL | ORAL_SOLUTION | ORAL | Status: DC | PRN

## 2013-04-21 MED ORDER — LISINOPRIL 10 MG PO TABS
10.0000 mg | ORAL_TABLET | Freq: Every day | ORAL | Status: DC
  Administered 2013-04-21 – 2013-04-28 (×8): 10 mg via ORAL
  Filled 2013-04-21 (×8): qty 1

## 2013-04-21 MED ORDER — POLYETHYLENE GLYCOL 3350 17 G PO PACK
17.0000 g | PACK | Freq: Every day | ORAL | Status: DC
  Administered 2013-04-21 – 2013-04-26 (×5): 17 g via ORAL
  Filled 2013-04-21 (×8): qty 1

## 2013-04-21 NOTE — Progress Notes (Signed)
IP PROGRESS NOTE  Subjective:   Patient is feeling better with pain is under better control.   Objective:  Vital signs in last 24 hours: Temp:  [97.9 F (36.6 C)-99.1 F (37.3 C)] 99.1 F (37.3 C) (09/12 0400) Pulse Rate:  [89-101] 95 (09/12 1138) Resp:  [10-19] 16 (09/12 1138) BP: (129-152)/(68-72) 131/72 mmHg (09/12 1138) SpO2:  [99 %-100 %] 99 % (09/12 1138) Weight:  [162 lb 0.6 oz (73.5 kg)] 162 lb 0.6 oz (73.5 kg) (09/12 1210) Weight change:  Last BM Date: 04/18/13  Intake/Output from previous day: 09/11 0701 - 09/12 0700 In: 1203.7 [P.O.:780; I.V.:423.7] Out: 1925 [Urine:1925]  Mouth: mucous membranes moist, pharynx normal without lesions Resp: clear to auscultation bilaterally Cardio: regular rate and rhythm, S1, S2 normal, no murmur, click, rub or gallop GI: soft, non-tender; bowel sounds normal; no masses,  no organomegaly Extremities: extremities normal, atraumatic, no cyanosis or edema    Lab Results:  Recent Labs  04/20/13 0510 04/21/13 0535  WBC 1.4* 0.9*  HGB 10.0* 9.8*  HCT 28.4* 27.8*  PLT 36* 29*    BMET  Recent Labs  04/20/13 0510 04/21/13 0535  NA 117* 117*  K 5.1 4.8  CL 90* 89*  CO2 21 21  GLUCOSE 115* 120*  BUN 23 17  CREATININE 0.34* 0.33*  CALCIUM 8.4 8.4     Medications: I have reviewed the patient's current medications.  Assessment/Plan:  62 year old with the following issues:  1. Advanced prostate cancer status post multiple treatments currently receiving radiation therapy to his multiple spinal lesions. He is undergoing radiation at this time.   2. DVT without a PE on CT scan: I agree with Lovenox treatment despite the thrombocytopenia giving the increased risk of mortality associated with VTEs. No bleeding noted.   3. Pancytopenia: Likely related to cancer involvement in his bone marrow which really a poor prognostic feature.  I will continue to follow.    LOS: 3 days   [facility] 04/21/2013, 12:53 PM

## 2013-04-21 NOTE — Progress Notes (Signed)
Reports that right arm pain is better controlled with dilaudid. Patient unable to score right arm pain at this time. Patient drowsy as he was just medicated prior to transport. Reports numbness and tingling in right arm continues. Patient concerned that his left arm is weak and with new mild pain. Patient given miralax this morning because he has not had a bowel movement since Tuesday. Condom cath noted. Denies nausea, vomiting, headache or dizziness. Taking decadron 4 mg daily.

## 2013-04-21 NOTE — Progress Notes (Signed)
PT Cancellation Note  Patient Details Name: Andrew Nelson MRN: [id number] DOB: [birth year]   Cancelled Treatment:    Reason Eval/Treat Not Completed: Patient declined, no reason specified (stated"I know it is coming, but I am no ready") Admitted after stay on CIR. + RUE DVT  Rada Hay 04/21/2013, 2:26 PM

## 2013-04-21 NOTE — Progress Notes (Signed)
[facility] PROGRESS NOTE  Andrew Nelson BMW:[id number] DOB: 1950/10/19 DOA: 04/18/2013 PCP: [name], MD  Brief narrative: Andrew Nelson is an 62 y.o. male with a PMH of metastatic castration resistant prostate cancer, status post radiation therapy and chemotherapy with Jevtana, status post T6-T7 laminectomy and resection of epidural tumor, left L5 decompressive hemilaminectomy with resection of epidural tumor on 03/13/2013 who was admitted on 03/29/2013 with progressively worsening lower extremity weakness. 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 He then had further radiation therapy and was ultimately discharged to CIR where he completed rehabilitation and was discharged from rehabilitation on 04/18/2013 secondary to worsening right-sided chest and upper extremity pain impairing his ability to participate in rehabilitation.  RUE confirmed DVT.  Assessment/Plan: Principal Problem:   Chest pain and right upper extremity pain in a patient with metastatic prostate cancer / right upper extremity DVT -Initial workup included cardiac markers which were negative. Right upper extremity Dopplers ultimately revealed a DVT which, along with his malignancy, likely is the cause of his chest and right shoulder pain. No evidence of pulmonary embolism on CT angiogram. -Continue pain management with fentanyl and when necessary oxycodone and IV dilaudid. -Lovenox therapy initiated on 04/19/2013. Active Problems:   Hypocalcemia -Resolved status post calcium gluconate 04/19/2013. Continue calcium carbonate supplementation.   Hypokalemia / hypomagnesemia -Monitor and replace electrolytes as needed.   Pancytopenia -Likely related to cancer involvement of his bone marrow per oncologist. -Watch platelet count closely since patient is on  Lovenox for treatment of DVT. -Watch for infection given his low WBC count.   Hyponatremia -Urine studies consistent with SIADH. -Continue demeclocycline and fluid restriction. No improvement in sodium yet.   DM w/o Complication Type I -Continue 70/30, 28 units every afternoon and SSI, insulin resistant scale before meals. -CBG is 106-154.   PROSTATE CANCER, HX OF -Seen by [name] 04/19/2013. Not currently a candidate for further chemotherapy. -Seen by the palliative care team, continues to want aggressive treatment.   Hypertension -Antihypertensives on hold. Blood pressure beginning to creep up. Resume lisinopril.   Metastasis of neoplasm to spinal canal -Currently undergoing radiation treatments to T1 and T5. -Continue Decadron.   Anemia of chronic disease -Monitor hemoglobin and transfuse for hemoglobin less than 7 mg/dL. -Status post 1 unit of packed red blood cells 04/19/2013. Hemoglobin stable post transfusion.   Protein-calorie malnutrition, moderate -Likely from cancer related cachexia. Seen by dietitian on 04/19/2013. Continue Glucerna shakes and multivitamins.  Code Status: Full. Family Communication: Wife updated at bedside. Disposition Plan: SNF versus back to CIR.   Medical Consultants:  [name], Oncology.  [name], Radiation Oncology.  [name], Palliative care  Other Consultants:  Dietitian  Anti-infectives:  None.  HPI/Subjective: Andrew Nelson continues to have some right chest and shoulder discomfort. No dyspnea. Occasional cough. Feels constipated. No nausea or vomiting.  Objective: Filed Vitals:   04/20/13 2000 04/20/13 2235 04/21/13 0400 04/21/13 0550  BP:  152/68  129/68  Pulse:  101  89  Temp: 99.1 F (37.3 C) 98.7 F (37.1 C) 99.1 F (37.3 C)   TempSrc: Oral  Oral   Resp:  19  10  Height:      Weight:      SpO2:  100%  100%    Intake/Output Summary (Last 24 hours) at 04/21/13 2725 Last data filed  at 04/21/13 0600  Gross per 24 hour  Intake 1203.67 ml  Output   1925 ml  Net -721.33 ml    Exam: Gen:  NAD Cardiovascular:  Regular rate, and rhythm. Respiratory:  Lungs CTAB Gastrointestinal:  Abdomen soft with distended, nontender, + BS Extremities: No pedal edema, right upper extremity swollen  Data Reviewed: Basic Metabolic Panel:  Recent Labs Lab 04/19/13 04/19/13 0625 04/19/13 0840 04/20/13 0510 04/21/13 0535  NA 114* 116* 123* 117* 117*  K 3.5 3.6 2.5* 5.1 4.8  CL 83* 86* 98 90* 89*  CO2 23 24 18* 21 21  GLUCOSE 124* 104* 96 115* 120*  BUN 29* 31* 25* 23 17  CREATININE 0.43* 0.41* 0.29* 0.34* 0.33*  CALCIUM 8.4 8.1* 6.0* 8.4 8.4  MG  --   --  1.4*  --   --    GFR Estimated Creatinine Clearance: 90 ml/min (by C-G formula based on Cr of 0.33). Liver Function Tests:  Recent Labs Lab 04/18/13 1859  AST 41*  ALT 28  ALKPHOS 87  BILITOT 0.5  PROT 4.8*  ALBUMIN 2.3*   Coagulation profile  Recent Labs Lab 04/18/13 2345  INR 1.08    CBC:  Recent Labs Lab 04/19/13 0625 04/19/13 0840 04/19/13 1930 04/20/13 0510 04/21/13 0535  WBC 1.5* 1.2* 1.5* 1.4* 0.9*  HGB 7.8* 6.6* 10.1* 10.0* 9.8*  HCT 22.5* 18.8* 28.4* 28.4* 27.8*  MCV 78.1 77.4* 77.4* 78.2 78.5  PLT 45* 35* 40* 36* 29*   Cardiac Enzymes:  Recent Labs Lab 04/18/13 1859 04/19/13  TROPONINI <0.30 <0.30   CBG:  Recent Labs Lab 04/19/13 2306 04/20/13 0739 04/20/13 1215 04/20/13 1740 04/20/13 2304  GLUCAP 145* 113* 154* 148* 106*   D-Dimer  Recent Labs  04/18/13 2345  DDIMER 2.04*   Microbiology Recent Results (from the past 240 hour(s))  MRSA PCR SCREENING     Status: None   Collection Time    04/18/13 11:00 PM      Result Value Range Status   MRSA by PCR NEGATIVE  NEGATIVE Final   Comment:            The GeneXpert MRSA Assay (FDA     approved for NASAL specimens     only), is one component of a     comprehensive MRSA colonization     surveillance program. It is  not     intended to diagnose MRSA     infection nor to guide or     monitor treatment for     MRSA infections.     Procedures and Diagnostic Studies: None.   Scheduled Meds: . calcium carbonate  1 tablet Oral BID WC  . demeclocycline  300 mg Oral Q12H  . dexamethasone  4 mg Oral QID  . doxazosin  8 mg Oral QHS  . enoxaparin (LOVENOX) injection  70 mg Subcutaneous Q12H  . feeding supplement  237 mL Oral TID BM  . fentaNYL  100 mcg Transdermal Q72H  . insulin aspart  0-20 Units Subcutaneous TID WC  . insulin aspart protamine- aspart  28 Units Subcutaneous Q supper  . magnesium oxide  400 mg Oral BID WC  . multivitamin with minerals  1 tablet Oral Daily  . pantoprazole  40 mg Oral QHS  . senna  1 tablet Oral BID   Continuous Infusions: . sodium chloride 20 mL/hr (04/20/13 0849)    Time spent: 35 minutes with greater than 50% of time spent discussing current test results, clinical  impression and plan of care with both the patient and his family.   LOS: 3 days   RAMA,CHRISTINA  [facility] Pager [phone].   *Please note that the hospitalists switch teams on Wednesdays. Please call the flow manager at [phone] if you are having difficulty reaching the hospitalist taking care of this patient as she can update you and provide the most up-to-date pager number of provider caring for the patient. If 8PM-8AM, please contact night-coverage at [email or web address], password [facility]  04/21/2013, 7:05 AM

## 2013-04-21 NOTE — Progress Notes (Signed)
  Radiation Oncology         ([phone]) [phone] ________________________________  Name: Andrew Nelson MRN: [id number]  Date: 04/21/2013  DOB: 26-Feb-1951  Weekly Radiation Therapy Management  Current Dose: 12 Gy     Planned Dose:  30 Gy  Narrative . . . . . . . . The patient presents for routine under treatment assessment.              Reports that right arm pain is better controlled with dilaudid. Patient unable to score right arm pain at this time. Patient drowsy as he was just medicated prior to transport. Reports numbness and tingling in right arm continues. Patient concerned that his left arm is weak and with new mild pain. Patient given miralax this morning because he has not had a bowel movement since Tuesday. Condom cath noted. Denies nausea, vomiting, headache or dizziness. Taking decadron 4 mg daily                                 Set-up films were reviewed.                                 The chart was checked. Physical Findings. . .  blood pressure is 131/72 and his pulse is 95. His respiration is 16 and oxygen saturation is 99%. . Weight essentially stable.  No significant changes. Impression . . . . . . . The patient is tolerating radiation. Plan . . . . . . . . . . . . Continue treatment as planned.  ________________________________  Artist Pais. Kathrynn Running, M.D.

## 2013-04-22 LAB — BASIC METABOLIC PANEL
CO2: 22 mEq/L (ref 19–32)
Chloride: 91 mEq/L — ABNORMAL LOW (ref 96–112)
Sodium: 118 mEq/L — CL (ref 135–145)

## 2013-04-22 LAB — CBC
HCT: 26.3 % — ABNORMAL LOW (ref 39.0–52.0)
Hemoglobin: 9.1 g/dL — ABNORMAL LOW (ref 13.0–17.0)
MCV: 79.5 fL (ref 78.0–100.0)
RBC: 3.31 MIL/uL — ABNORMAL LOW (ref 4.22–5.81)
WBC: 0.9 10*3/uL — CL (ref 4.0–10.5)

## 2013-04-22 LAB — GLUCOSE, CAPILLARY: Glucose-Capillary: 175 mg/dL — ABNORMAL HIGH (ref 70–99)

## 2013-04-22 MED ORDER — MAGNESIUM OXIDE 400 (241.3 MG) MG PO TABS
400.0000 mg | ORAL_TABLET | Freq: Every day | ORAL | Status: DC
  Administered 2013-04-23 – 2013-04-28 (×6): 400 mg via ORAL
  Filled 2013-04-22 (×6): qty 1

## 2013-04-22 MED ORDER — TOLVAPTAN 15 MG PO TABS
15.0000 mg | ORAL_TABLET | ORAL | Status: DC
  Administered 2013-04-22 – 2013-04-25 (×4): 15 mg via ORAL
  Filled 2013-04-22 (×8): qty 1

## 2013-04-22 MED ORDER — BISACODYL 10 MG RE SUPP: 10.0000 mg | Freq: Every day | RECTAL | Status: DC | PRN

## 2013-04-22 NOTE — Progress Notes (Signed)
ANTICOAGULATION CONSULT NOTE   Pharmacy Consult for Lovenox Indication: RUE DVT  Allergies  Allergen Reactions  . Aprindine Other (See Comments)    unknown  . Bc Powder [Aspirin-Salicylamide-Caffeine] Hives  . Penicillins Hives    Patient Measurements: Height: 5' 7.5" (171.5 cm) Weight: 162 lb 0.6 oz (73.5 kg) IBW/kg (Calculated) : 67.25  Vital Signs: Temp: 98.7 F (37.1 C) (09/13 0603) Temp src: Oral (09/13 0603) BP: 138/63 mmHg (09/13 0603) Pulse Rate: 102 (09/13 0603)  Labs:  Recent Labs  04/19/13 1930 04/20/13 0510 04/21/13 0535 04/22/13 0605  HGB 10.1* 10.0* 9.8*  --   HCT 28.4* 28.4* 27.8*  --   PLT 40* 36* 29*  --   CREATININE  --  0.34* 0.33* 0.31*    Estimated Creatinine Clearance: 90 ml/min (by C-G formula based on Cr of 0.31).   Medical History: Past Medical History  Diagnosis Date  . Diabetes mellitus   . Hypertension   . Prostate ca 6/07    prostate ca dx  . Allergy   . Anemia   . CAD (coronary artery disease)   . Blood transfusion without reported diagnosis     May 2013  . History of radiation therapy 09/19/12-10/10/2012    T-7-T9,35Gy/32fx,Prescaral mass 35Gy/14fx  . History of radiation therapy 05/2006-06/2006    salvage prostate rad tx 68.4Gy  . Bone cancer     prostate ca with mets to spine    Medications:  Scheduled:  . calcium carbonate  1 tablet Oral BID WC  . demeclocycline  300 mg Oral Q12H  . dexamethasone  4 mg Oral QID  . doxazosin  8 mg Oral QHS  . enoxaparin (LOVENOX) injection  70 mg Subcutaneous Q12H  . feeding supplement  237 mL Oral TID BM  . fentaNYL  100 mcg Transdermal Q72H  . insulin aspart  0-20 Units Subcutaneous TID WC  . insulin aspart protamine- aspart  28 Units Subcutaneous Q supper  . lisinopril  10 mg Oral Daily  . magnesium oxide  400 mg Oral BID WC  . multivitamin with minerals  1 tablet Oral Daily  . pantoprazole  40 mg Oral QHS  . polyethylene glycol  17 g Oral Daily  . senna  1 tablet Oral BID    Infusions:  . sodium chloride 20 mL/hr (04/20/13 0849)    Assessment: 61 y.o. male with metastatic castrate-resistant prostate cancer. Dopplers reveal DVT in axillary vein with superficial thrombus in the cephalic vein at the elbow. Lovenox per pharmacy protocol ordered.  Weight ~72kg  SCr 0.31, CrCl 90 ml/min  Thrombocytopenic, pltc = 31k today. Per Oncology, etiology is likely related to malignancy and wishes to proceed with lovenox and monitor closely.  Hgb 9.1 - signs of trending back down s/p 3 units PRBC   Goal of Therapy:  Anti-Xa level 0.6-1.2 units/ml 4hrs after LMWH dose given Monitor platelets by anticoagulation protocol: Yes   Plan:   continue Lovenox 1mg /kg (70mg ) SQ q12h  Monitor renal function  Follow CBC and monitor for signs/symptoms of bleeding - none currently per RN  Juliette Alcide, PharmD, BCPS.   Pager: [phone] 04/22/2013,7:20 AM

## 2013-04-22 NOTE — Progress Notes (Signed)
[facility] PROGRESS NOTE  Andrew Nelson AVW:[id number] DOB: 02-Nov-1950 DOA: 04/18/2013 PCP: [name], MD  Brief narrative: Andrew Nelson is an 62 y.o. male with a PMH of metastatic castration resistant prostate cancer, status post radiation therapy and chemotherapy with Jevtana, status post T6-T7 laminectomy and resection of epidural tumor, left L5 decompressive hemilaminectomy with resection of epidural tumor on 03/13/2013 who was admitted on 03/29/2013 with progressively worsening lower extremity weakness. 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 He then had further radiation therapy and was ultimately discharged to CIR where he completed rehabilitation and was discharged from rehabilitation on 04/18/2013 secondary to worsening right-sided chest and upper extremity pain impairing his ability to participate in rehabilitation.  RUE confirmed DVT.  Assessment/Plan: Principal Problem:   Chest pain and right upper extremity pain in a patient with metastatic prostate cancer / right upper extremity DVT -Initial workup included cardiac markers which were negative. Right upper extremity Dopplers ultimately revealed a DVT which, along with his malignancy, likely is the cause of his chest and right shoulder pain. No evidence of pulmonary embolism on CT angiogram. -Continue pain management with fentanyl and when necessary oxycodone and IV dilaudid. -Lovenox therapy initiated on 04/19/2013. Active Problems:   Hypocalcemia -Resolved status post calcium gluconate 04/19/2013. Continue calcium carbonate supplementation.   Hypokalemia / hypomagnesemia -Monitor and replace electrolytes as needed. -Magnesium now repleted, will decrease supplementation dose to 400 mg of magnesium oxide daily.   Pancytopenia -Likely related to cancer  involvement of his bone marrow per oncologist. -Watch platelet count closely since patient is on Lovenox for treatment of DVT. -Watch for infection given his low WBC count.   Hyponatremia -Urine studies consistent with SIADH. -No improvement with demeclocycline and fluid restriction. No improvement in sodium yet. Will place on [place]a. Monitor sodium closely. Discontinue fluid restriction.   DM w/o Complication Type I -Continue 70/30, 28 units every afternoon and SSI, insulin resistant scale before meals. -CBG is 100-158.   PROSTATE CANCER, HX OF -Being followed by [name]. Not currently a candidate for further chemotherapy. -Seen by the palliative care team, continues to want aggressive treatment.   Hypertension -Continue lisinopril.   Metastasis of neoplasm to spinal canal -Currently undergoing radiation treatments to T1 and T5. -Continue Decadron.   Anemia of chronic disease -Monitor hemoglobin and transfuse for hemoglobin less than 7 mg/dL. -Status post 1 unit of packed red blood cells 04/19/2013. Hemoglobin stable post transfusion.   Protein-calorie malnutrition, moderate -Likely from cancer related cachexia. Seen by dietitian on 04/19/2013. Continue Glucerna shakes and multivitamins.  Code Status: Full. Family Communication: Wife updated at bedside. Disposition Plan: SNF versus back to CIR.   Medical Consultants:  [name], Oncology.  [name], Radiation Oncology.  [name], Palliative care  Other Consultants:  Dietitian  Anti-infectives:  None.  HPI/Subjective: Andrew Nelson continues to have some right chest and shoulder discomfort, currently rated 5/10 but gets as bad as 7/10. No dyspnea. Occasional cough. No bowel movement yet despite taking MiraLAX yesterday. Has not moved bowels in 4 days. No nausea or vomiting.  Objective: Filed Vitals:   04/21/13 1210 04/21/13 1215 04/21/13 2214 04/22/13 0603  BP:  104/62 90/55 138/63   Pulse:  98 99 102  Temp:  97.6 F (36.4 C) 98.6 F (37 C) 98.7 F (37.1 C)  TempSrc:  Oral  Oral Oral  Resp:  20 16 20   Height:      Weight: 73.5 kg (162 lb 0.6 oz)     SpO2:  100% 100% 99%    Intake/Output Summary (Last 24 hours) at 04/22/13 0759 Last data filed at 04/22/13 0604  Gross per 24 hour  Intake    440 ml  Output    650 ml  Net   -210 ml    Exam: Gen:  NAD Cardiovascular:  Regular rate, and rhythm. Respiratory:  Lungs CTAB Gastrointestinal:  Abdomen soft with distended, nontender, + BS Extremities: No pedal edema, right upper extremity swollen  Data Reviewed: Basic Metabolic Panel:  Recent Labs Lab 04/19/13 0625 04/19/13 0840 04/20/13 0510 04/21/13 0535 04/22/13 0605  NA 116* 123* 117* 117* 118*  K 3.6 2.5* 5.1 4.8 4.5  CL 86* 98 90* 89* 91*  CO2 24 18* 21 21 22   GLUCOSE 104* 96 115* 120* 138*  BUN 31* 25* 23 17 24*  CREATININE 0.41* 0.29* 0.34* 0.33* 0.31*  CALCIUM 8.1* 6.0* 8.4 8.4 8.1*  MG  --  1.4*  --   --  2.0   GFR Estimated Creatinine Clearance: 90 ml/min (by C-G formula based on Cr of 0.31). Liver Function Tests:  Recent Labs Lab 04/18/13 1859  AST 41*  ALT 28  ALKPHOS 87  BILITOT 0.5  PROT 4.8*  ALBUMIN 2.3*   Coagulation profile  Recent Labs Lab 04/18/13 2345  INR 1.08    CBC:  Recent Labs Lab 04/19/13 0840 04/19/13 1930 04/20/13 0510 04/21/13 0535 04/22/13 0605  WBC 1.2* 1.5* 1.4* 0.9* 0.9*  HGB 6.6* 10.1* 10.0* 9.8* 9.1*  HCT 18.8* 28.4* 28.4* 27.8* 26.3*  MCV 77.4* 77.4* 78.2 78.5 79.5  PLT 35* 40* 36* 29* 31*   Cardiac Enzymes:  Recent Labs Lab 04/18/13 1859 04/19/13  TROPONINI <0.30 <0.30   CBG:  Recent Labs Lab 04/21/13 0746 04/21/13 1221 04/21/13 1659 04/21/13 2209 04/22/13 0748  GLUCAP 126* 151* 158* 100* 129*   Microbiology Recent Results (from the past 240 hour(s))  MRSA PCR SCREENING     Status: None   Collection Time    04/18/13 11:00 PM      Result Value Range Status   MRSA  by PCR NEGATIVE  NEGATIVE Final   Comment:            The GeneXpert MRSA Assay (FDA     approved for NASAL specimens     only), is one component of a     comprehensive MRSA colonization     surveillance program. It is not     intended to diagnose MRSA     infection nor to guide or     monitor treatment for     MRSA infections.     Procedures and Diagnostic Studies: None.   Scheduled Meds: . calcium carbonate  1 tablet Oral BID WC  . demeclocycline  300 mg Oral Q12H  . dexamethasone  4 mg Oral QID  . doxazosin  8 mg Oral QHS  . enoxaparin (LOVENOX) injection  70 mg Subcutaneous Q12H  . feeding supplement  237 mL Oral TID BM  . fentaNYL  100 mcg Transdermal Q72H  . insulin aspart  0-20 Units Subcutaneous TID WC  . insulin aspart protamine- aspart  28 Units Subcutaneous Q supper  . lisinopril  10 mg Oral Daily  . magnesium oxide  400 mg Oral BID WC  . multivitamin with minerals  1 tablet Oral Daily  .  pantoprazole  40 mg Oral QHS  . polyethylene glycol  17 g Oral Daily  . senna  1 tablet Oral BID   Continuous Infusions: . sodium chloride 20 mL/hr (04/20/13 0849)    Time spent: 25 minutes.   LOS: 4 days   Taegen Lennox  [facility] Pager [phone].   *Please note that the hospitalists switch teams on Wednesdays. Please call the flow manager at [phone] if you are having difficulty reaching the hospitalist taking care of this patient as she can update you and provide the most up-to-date pager number of provider caring for the patient. If 8PM-8AM, please contact night-coverage at [email or web address], password [facility]  04/22/2013, 7:59 AM

## 2013-04-22 NOTE — Evaluation (Signed)
Physical Therapy Evaluation Patient Details Name: Andrew Nelson MRN: [id number] DOB: 1951/05/21 Today's Date: 04/22/2013 Time: 1308-6578 PT Time Calculation (min): 39 min  PT Assessment / Plan / Recommendation History of Present Illness    Andrew Nelson is an 62 y.o. male with a PMH of metastatic castration resistant prostate cancer, status post radiation therapy and chemotherapy with Jevtana, status post T6-T7 laminectomy and resection of epidural tumor, left L5 decompressive hemilaminectomy with resection of epidural tumor on 03/13/2013 who was admitted on 03/29/2013 with progressively worsening lower extremity weakness. 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 He then had further radiation therapy and was ultimately discharged to CIR where he completed rehabilitation and was discharged from rehabilitation on 04/18/2013 secondary to worsening right-sided chest and upper extremity pain impairing his ability to participate in rehabilitation. RUE confirmed DVT.   Clinical Impression  Pt currently presents with functional mobility severely limited by significant weakness of bilateral LEs, R UE and trunk; pain increased with mobility: and balance deficits.  Pt currently requires extensive assist for any mobilization and would benefit from follow up at SNF level vs 24/7 assist at home.  For home placement pt and care givers would greatly benefit from use of hospital bed and mechanical lift equipment to assist in caring for pt safely.     PT Assessment  Patient needs continued PT services    Follow Up Recommendations  Home health PT;SNF (dependent on level of assist available at home)    Does the patient have the potential to tolerate intense rehabilitation      Barriers to Discharge Inaccessible home environment      Equipment  Recommendations  Hospital bed;Other (comment) (Mechanical lift)    Recommendations for Other Services OT consult   Frequency Min 3X/week    Precautions / Restrictions Precautions Precautions: Fall;Back Restrictions Weight Bearing Restrictions: No   Pertinent Vitals/Pain 8/10; premedicated.  Pt declines any further pain meds at this time      Mobility  Bed Mobility Bed Mobility: Rolling Left;Rolling Right;Left Sidelying to Sit;Sit to Sidelying Left Rolling Right: 3: Mod assist Rolling Left: 3: Mod assist Left Sidelying to Sit: 1: +2 Total assist Left Sidelying to Sit: Patient Percentage: 20% Sitting - Scoot to Edge of Bed: 3: Mod assist Sit to Sidelying Left: 1: +2 Total assist Sit to Sidelying Left: Patient Percentage: 10% Details for Bed Mobility Assistance: cues for technique and assist to complete transitional movements Transfers Transfers: Not assessed Ambulation/Gait Ambulation/Gait Assistance: Not tested (comment)    Exercises     PT Diagnosis: Difficulty walking;Acute pain  PT Problem List: Decreased balance;Decreased mobility;Decreased safety awareness;Decreased knowledge of precautions;Pain PT Treatment Interventions: DME instruction;Functional mobility training;Therapeutic activities;Therapeutic exercise;Balance training;Patient/family education     PT Goals(Current goals can be found in the care plan section) Acute Rehab PT Goals Patient Stated Goal: "I'm just trying to survive" PT Goal Formulation: With patient Time For Goal Achievement: 04/14/13 Potential to Achieve Goals: Good  Visit Information  Last PT Received On: 04/22/13 Assistance Needed: +2       Prior Functioning  Home Living Family/patient expects to be discharged to:: Private residence Living Arrangements: Spouse/significant other Available Help at Discharge: Available 24 hours/day;Family Type of Home: House Home Access: Stairs to enter [employer] of Steps: 3 Entrance  Stairs-Rails: Right Home Layout: Two level;Bed/bath upstairs Alternate Level Stairs-Number of Steps: 12  Alternate Level Stairs-Rails: Can reach both Home Equipment: Shower seat;Bedside commode;Other (comment);Walker - 2 wheels;Adaptive equipment;Wheelchair - Equities trader: Reacher;Sock aid;Long-handled Building services engineer Additional Comments: 1/2 bath on first floor; all bedrooms on 2nd; Pt states they are planning hospital bed on main floor  Lives With: Spouse Prior Function Level of Independence: Needs assistance Comments: Pt states he was working on bed <>chair transfers at [employer] with and without slide board Communication Communication: No difficulties    Cognition  Cognition Arousal/Alertness: Awake/alert Behavior During Therapy: WFL for tasks assessed/performed Overall Cognitive Status: Within Functional Limits for tasks assessed Memory: Decreased short-term memory    Extremity/Trunk Assessment Upper Extremity Assessment Upper Extremity Assessment: RUE deficits/detail RUE Deficits / Details: min flex at hand with 3/5 strength at elbow and 2+/5 at shoulder Lower Extremity Assessment Lower Extremity Assessment: RLE deficits/detail;LLE deficits/detail RLE Deficits / Details:  1/5 knee extension, hip extension 2-/5; flexion  2-/5, knee flexion 2_/5, dorsiflexion 2/5.  RLE Sensation: decreased proprioception;decreased light touch RLE Coordination: decreased fine motor;decreased gross motor LLE Deficits / Details: Hip extension 2/5, hip flexion 2/5, knee extension 1/5, knee flexion 2/5, dorsiflexion 2/5 LLE Sensation: decreased proprioception;decreased light touch LLE Coordination: decreased fine motor;decreased gross motor   Balance Static Sitting Balance Static Sitting - Balance Support: Feet supported;Right upper extremity supported (Pt unable to bring head/neck to upright posture) Static Sitting - Level of Assistance: 5: Stand by assistance;4: Min assist (to compensate for R  drift) Static Sitting - Comment/# of Minutes: 10  End of Session PT - End of Session Activity Tolerance: Patient limited by fatigue;Patient limited by pain Patient left: in bed;with call bell/phone within reach Nurse Communication: Mobility status;Need for lift equipment  GP     Adrielle Polakowski 04/22/2013, 5:41 PM

## 2013-04-23 ENCOUNTER — Inpatient Hospital Stay (HOSPITAL_COMMUNITY): Payer: BC Managed Care – PPO

## 2013-04-23 DIAGNOSIS — I82409 Acute embolism and thrombosis of unspecified deep veins of unspecified lower extremity: Secondary | ICD-10-CM

## 2013-04-23 DIAGNOSIS — Z515 Encounter for palliative care: Secondary | ICD-10-CM

## 2013-04-23 DIAGNOSIS — R0789 Other chest pain: Secondary | ICD-10-CM | POA: Diagnosis not present

## 2013-04-23 DIAGNOSIS — R0989 Other specified symptoms and signs involving the circulatory and respiratory systems: Secondary | ICD-10-CM | POA: Diagnosis not present

## 2013-04-23 LAB — CBC
MCH: 27.9 pg (ref 26.0–34.0)
MCHC: 35.3 g/dL (ref 30.0–36.0)
MCV: 79.1 fL (ref 78.0–100.0)
Platelets: 24 10*3/uL — CL (ref 150–400)
RBC: 3.4 MIL/uL — ABNORMAL LOW (ref 4.22–5.81)

## 2013-04-23 LAB — SODIUM: Sodium: 123 mEq/L — ABNORMAL LOW (ref 135–145)

## 2013-04-23 LAB — TYPE AND SCREEN
ABO/RH(D): O POS
Unit division: 0

## 2013-04-23 LAB — GLUCOSE, CAPILLARY
Glucose-Capillary: 109 mg/dL — ABNORMAL HIGH (ref 70–99)
Glucose-Capillary: 151 mg/dL — ABNORMAL HIGH (ref 70–99)
Glucose-Capillary: 152 mg/dL — ABNORMAL HIGH (ref 70–99)

## 2013-04-23 LAB — BASIC METABOLIC PANEL
BUN: 19 mg/dL (ref 6–23)
CO2: 23 mEq/L (ref 19–32)
Calcium: 8.6 mg/dL (ref 8.4–10.5)
GFR calc non Af Amer: >90 mL/min (ref >90)
Glucose, Bld: 113 mg/dL — ABNORMAL HIGH (ref 70–99)

## 2013-04-23 MED ORDER — GUAIFENESIN ER 600 MG PO TB12
600.0000 mg | ORAL_TABLET | Freq: Two times a day (BID) | ORAL | Status: DC
  Administered 2013-04-23 – 2013-04-28 (×11): 600 mg via ORAL
  Filled 2013-04-23 (×13): qty 1

## 2013-04-23 NOTE — Progress Notes (Signed)
Clinical Social Work Department BRIEF PSYCHOSOCIAL ASSESSMENT 04/23/2013  Patient:  Andrew Nelson, Andrew Nelson     Account Number:  [id number]     Admit date:  04/18/2013  Clinical Social Worker:  Doroteo Glassman  Date/Time:  04/23/2013 02:58 PM  Referred by:  Physician  Date Referred:  04/23/2013 Referred for  SNF Placement   Other Referral:   Interview type:  Patient Other interview type:   Pt's wife at bedside    PSYCHOSOCIAL DATA Living Status:  WIFE Admitted from facility:   Level of care:   Primary support name:  Mrs. Kramar Primary support relationship to patient:  SPOUSE Degree of support available:   strong    CURRENT CONCERNS Current Concerns  Post-Acute Placement   Other Concerns:    SOCIAL WORK ASSESSMENT / PLAN Met with Pt and wife to discuss d/c plans.    Pt and wife still weighing d/c options but are leaning toward SNF.  Wife would like to explore services available to her in the home, should they decide for Pt to d/c home. CSW notified RNCM.    CSW provided Pt and his wife with a SNF list and received permission to begin the SNF search.    CSW thanked Pt and his wife for their time.   Assessment/plan status:  Psychosocial Support/Ongoing Assessment of Needs Other assessment/ plan:   Information/referral to community resources:   SNF list    PATIENT'S/FAMILY'S RESPONSE TO PLAN OF CARE: Pt and family struggling with d/c plans, as Pt's wife would like to care for Pt in the home.  Pt doesn't feel that his wife can and is agreeable to SNF search to see what's available to him.    Pt and wife thanked CSW for time and assistance.   Providence Crosby, LCSWA Clinical Social Work [phone]

## 2013-04-23 NOTE — Progress Notes (Signed)
Palliative medicine team  Patient seen and examined, says that the pain is reasonably controlled with pain medications. Planning to go to rehab facility.   Filed Vitals:   04/23/13 1252  BP: 103/58  Pulse:   Temp:   Resp:     Chest: Clear Bilaterally Heart : S1S2 RRR Abdomen: Soft, nontender Ext : No edema Neuro: Alert, oriented x 3  A/P  Metastatic prostate cancer Right upper extremity DVT Chest pain Pancytopenia  Patient is full code, and want to continue with current treatments Plan to go to rehab facility  Kymber Kosar Daune [facility] Medicine Team Pager- [phone]

## 2013-04-23 NOTE — Progress Notes (Signed)
CRITICAL VALUE ALERT  Critical value received:  Platelet 24  Date of notification:  04/23/13  Time of notification:  0756  Critical value read back: yes  Nurse who received alert:  Addison Naegeli, RN  MD notified (1st page):  Rama  Time of first page:  0801  MD notified (2nd page):  Time of second page:  Responding MD:    Time MD responded:

## 2013-04-23 NOTE — Progress Notes (Signed)
[facility] PROGRESS NOTE  Andrew Nelson NFA:[id number] DOB: June 16, 1951 DOA: 04/18/2013 PCP: [name], MD  Brief narrative: Andrew Nelson is an 62 y.o. male with a PMH of metastatic castration resistant prostate cancer, status post radiation therapy and chemotherapy with Jevtana, status post T6-T7 laminectomy and resection of epidural tumor, left L5 decompressive hemilaminectomy with resection of epidural tumor on 03/13/2013 who was admitted on 03/29/2013 with progressively worsening lower extremity weakness. 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 He then had further radiation therapy and was ultimately discharged to CIR where he completed rehabilitation and was discharged from rehabilitation on 04/18/2013 secondary to worsening right-sided chest and upper extremity pain impairing his ability to participate in rehabilitation.  RUE confirmed DVT.  Assessment/Plan: Principal Problem:   Chest pain and right upper extremity pain in a patient with metastatic prostate cancer / right upper extremity DVT -Initial workup included cardiac markers which were negative. Right upper extremity Dopplers ultimately revealed a DVT which, along with his malignancy, likely is the cause of his chest and right shoulder pain. No evidence of pulmonary embolism on CT angiogram. -Continue pain management with fentanyl and when necessary oxycodone and IV dilaudid. -Lovenox therapy initiated on 04/19/2013. Active Problems:   Chest tightness/chest congestion -Will get portable chest x-ray today and start on Mucinex.   Hypocalcemia -Resolved status post calcium gluconate 04/19/2013. Continue calcium carbonate supplementation.   Hypokalemia / hypomagnesemia -Monitor and replace electrolytes as needed. -Magnesium now repleted, will decrease  supplementation dose to 400 mg of magnesium oxide daily.   Pancytopenia -Likely related to cancer involvement of his bone marrow per oncologist. -Watch platelet count closely since patient is on Lovenox for treatment of DVT. -Watch for infection given his low WBC count. New chest tightness and congestion noted 04/23/2013. Portable chest x-ray requested.   Hyponatremia -Urine studies consistent with SIADH. -Failed therapy with demeclocycline. Sodium beginning to improve on Samsca.   DM w/o Complication Type I -Continue 70/30, 28 units every afternoon and SSI, insulin resistant scale before meals. -CBG is 100-175.   PROSTATE CANCER, HX OF -Being followed by [name]. Not currently a candidate for further chemotherapy. -Seen by the palliative care team, continues to want aggressive treatment.   Hypertension -Continue lisinopril.   Metastasis of neoplasm to spinal canal -Currently undergoing radiation treatments to T1 and T5. -Continue Decadron.   Anemia of chronic disease -Monitor hemoglobin and transfuse for hemoglobin less than 7 mg/dL. -Status post 1 unit of packed red blood cells 04/19/2013. Hemoglobin stable post transfusion.   Protein-calorie malnutrition, moderate -Likely from cancer related cachexia. Seen by dietitian on 04/19/2013. Continue Glucerna shakes and multivitamins.  Code Status: Full. Family Communication: Wife updated at bedside. Disposition Plan: SNF versus back to CIR.   Medical Consultants:  [name], Oncology.  [name], Radiation Oncology.  [name], Palliative care  Other Consultants:  Dietitian  Anti-infectives:  None.  HPI/Subjective: Andrew Nelson complains of chest tightness today. Occasional cough with chest congestion. Appetite is good. Bowels moved yesterday. No dyspnea.  Objective: Filed Vitals:   04/22/13 0603 04/22/13 1430 04/22/13 2015 04/23/13 0553  BP: 138/63 118/62 114/69 116/67  Pulse: 102 107  106 89  Temp: 98.7 F (37.1 C) 99.2 F (37.3 C) 99 F (37.2 C) 98 F (36.7 C)  TempSrc: Oral Oral Oral Oral  Resp: 20 18  16 18  Height:      Weight:      SpO2: 99% 99% 100% 100%    Intake/Output Summary (Last 24 hours) at 04/23/13 0758 Last data filed at 04/23/13 0601  Gross per 24 hour  Intake    600 ml  Output   2500 ml  Net  -1900 ml    Exam: Gen:  NAD Cardiovascular:  Regular rate, and rhythm. Respiratory:  Lungs CTAB Gastrointestinal:  Abdomen soft with distended, nontender, + BS Extremities: No pedal edema, right upper extremity swollen  Data Reviewed: Basic Metabolic Panel:  Recent Labs Lab 04/19/13 0625 04/19/13 0840 04/20/13 0510 04/21/13 0535 04/22/13 0605 04/22/13 2120 04/23/13 0630  NA 116* 123* 117* 117* 118* 124* 123*  K 3.6 2.5* 5.1 4.8 4.5  --  4.5  CL 86* 98 90* 89* 91*  --  93*  CO2 24 18* 21 21 22   --  23  GLUCOSE 104* 96 115* 120* 138*  --  113*  BUN 31* 25* 23 17 24*  --  19  CREATININE 0.41* 0.29* 0.34* 0.33* 0.31*  --  0.29*  CALCIUM 8.1* 6.0* 8.4 8.4 8.1*  --  8.6  MG  --  1.4*  --   --  2.0  --   --    GFR Estimated Creatinine Clearance: 90 ml/min (by C-G formula based on Cr of 0.29). Liver Function Tests:  Recent Labs Lab 04/18/13 1859  AST 41*  ALT 28  ALKPHOS 87  BILITOT 0.5  PROT 4.8*  ALBUMIN 2.3*   Coagulation profile  Recent Labs Lab 04/18/13 2345  INR 1.08    CBC:  Recent Labs Lab 04/19/13 1930 04/20/13 0510 04/21/13 0535 04/22/13 0605 04/23/13 0630  WBC 1.5* 1.4* 0.9* 0.9* 1.1*  HGB 10.1* 10.0* 9.8* 9.1* 9.5*  HCT 28.4* 28.4* 27.8* 26.3* 26.9*  MCV 77.4* 78.2 78.5 79.5 79.1  PLT 40* 36* 29* 31* 24*   Cardiac Enzymes:  Recent Labs Lab 04/18/13 1859 04/19/13  TROPONINI <0.30 <0.30   CBG:  Recent Labs Lab 04/21/13 2209 04/22/13 0748 04/22/13 1206 04/22/13 1730 04/22/13 2110  GLUCAP 100* 129* 147* 175* 160*   Microbiology Recent Results (from the past 240 hour(s))  MRSA PCR  SCREENING     Status: None   Collection Time    04/18/13 11:00 PM      Result Value Range Status   MRSA by PCR NEGATIVE  NEGATIVE Final   Comment:            The GeneXpert MRSA Assay (FDA     approved for NASAL specimens     only), is one component of a     comprehensive MRSA colonization     surveillance program. It is not     intended to diagnose MRSA     infection nor to guide or     monitor treatment for     MRSA infections.     Procedures and Diagnostic Studies: None.   Scheduled Meds: . calcium carbonate  1 tablet Oral BID WC  . dexamethasone  4 mg Oral QID  . doxazosin  8 mg Oral QHS  . enoxaparin (LOVENOX) injection  70 mg Subcutaneous Q12H  . feeding supplement  237 mL Oral TID BM  . fentaNYL  100 mcg Transdermal Q72H  . insulin aspart  0-20 Units Subcutaneous TID WC  . insulin aspart protamine- aspart  28 Units Subcutaneous Q supper  . lisinopril  10 mg Oral Daily  .  magnesium oxide  400 mg Oral Daily  . multivitamin with minerals  1 tablet Oral Daily  . pantoprazole  40 mg Oral QHS  . polyethylene glycol  17 g Oral Daily  . senna  1 tablet Oral BID  . tolvaptan  15 mg Oral Q24H   Continuous Infusions: . sodium chloride 20 mL/hr (04/20/13 0849)    Time spent: 25 minutes.   LOS: 5 days   RAMA,CHRISTINA  [facility] Pager [phone].   *Please note that the hospitalists switch teams on Wednesdays. Please call the flow manager at [phone] if you are having difficulty reaching the hospitalist taking care of this patient as she can update you and provide the most up-to-date pager number of provider caring for the patient. If 8PM-8AM, please contact night-coverage at [email or web address], password [facility]  04/23/2013, 7:58 AM

## 2013-04-23 NOTE — Progress Notes (Signed)
Clinical Social Work Department CLINICAL SOCIAL WORK PLACEMENT NOTE 04/23/2013  Patient:  Andrew Nelson, Andrew Nelson  Account Number:  [id number] Admit date:  04/18/2013  Clinical Social Worker:  Doroteo Glassman  Date/time:  04/23/2013 03:04 PM  Clinical Social Work is seeking post-discharge placement for this patient at the following level of care:   SKILLED NURSING   (*CSW will update this form in Epic as items are completed)   04/23/2013  Patient/family provided with [facility] Department of Clinical Social Work's list of facilities offering this level of care within the geographic area requested by the patient (or if unable, by the patient's family).  04/23/2013  Patient/family informed of their freedom to choose among providers that offer the needed level of care, that participate in Medicare, Medicaid or managed care program needed by the patient, have an available bed and are willing to accept the patient.  04/23/2013  Patient/family informed of [facility]' ownership interest in [facility], as well as of the fact that they are under no obligation to receive care at this facility.  PASARR submitted to EDS on  PASARR number received from EDS on   FL2 transmitted to all facilities in geographic area requested by pt/family on  04/23/2013 FL2 transmitted to all facilities within larger geographic area on   Patient informed that his/her managed care company has contracts with or will negotiate with  certain facilities, including the following:     Patient/family informed of bed offers received:   Patient chooses bed at  Physician recommends and patient chooses bed at    Patient to be transferred to  on   Patient to be transferred to facility by   The following physician request were entered in Epic:   Additional Comments:  Providence Crosby, Theresia Majors Clinical Social Work [phone]

## 2013-04-23 NOTE — Progress Notes (Signed)
04/23/2013 1620 NCM spoke to pt and states his wife will help him make a decision about SNF vs HH. Lives at home with wife. Pt has RW and wheelchair. He is interested in getting a hospital bed if he goes home. Provided pt a list of private duty care agencies. NCM will continue to follow until discharge.  Isidoro Donning RN CCM Case Mgmt phone [phone]

## 2013-04-24 ENCOUNTER — Ambulatory Visit
Admit: 2013-04-24 | Discharge: 2013-04-24 | Disposition: A | Discharge status: Home / Self Care | Payer: BC Managed Care – PPO | Attending: Radiation Oncology | Admitting: Radiation Oncology

## 2013-04-24 DIAGNOSIS — R531 Weakness: Secondary | ICD-10-CM

## 2013-04-24 DIAGNOSIS — R5381 Other malaise: Secondary | ICD-10-CM

## 2013-04-24 DIAGNOSIS — R627 Adult failure to thrive: Secondary | ICD-10-CM

## 2013-04-24 LAB — BASIC METABOLIC PANEL
BUN: 25 mg/dL — ABNORMAL HIGH (ref 6–23)
Calcium: 8.1 mg/dL — ABNORMAL LOW (ref 8.4–10.5)
GFR calc Af Amer: >90 mL/min (ref >90)
GFR calc non Af Amer: >90 mL/min (ref >90)
Glucose, Bld: 100 mg/dL — ABNORMAL HIGH (ref 70–99)
Potassium: 4.7 mEq/L (ref 3.5–5.1)
Sodium: 123 mEq/L — ABNORMAL LOW (ref 135–145)

## 2013-04-24 LAB — GLUCOSE, CAPILLARY
Glucose-Capillary: 152 mg/dL — ABNORMAL HIGH (ref 70–99)
Glucose-Capillary: 170 mg/dL — ABNORMAL HIGH (ref 70–99)
Glucose-Capillary: 110 mg/dL — ABNORMAL HIGH (ref 70–99)

## 2013-04-24 LAB — CBC
MCH: 27.8 pg (ref 26.0–34.0)
MCHC: 34.8 g/dL (ref 30.0–36.0)
Platelets: 17 10*3/uL — CL (ref 150–400)

## 2013-04-24 NOTE — Progress Notes (Signed)
[facility] PROGRESS NOTE  Andrew Nelson NWG:[id number] DOB: 01/19/1951 DOA: 04/18/2013 PCP: [name], MD  Brief narrative: Andrew Nelson is an 63 y.o. male with a PMH of metastatic castration resistant prostate cancer, status post radiation therapy and chemotherapy with Jevtana, status post T6-T7 laminectomy and resection of epidural tumor, left L5 decompressive hemilaminectomy with resection of epidural tumor on 03/13/2013 who was admitted on 03/29/2013 with progressively worsening lower extremity weakness. 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 He then had further radiation therapy and was ultimately discharged to CIR where he completed rehabilitation and was discharged from rehabilitation on 04/18/2013 secondary to worsening right-sided chest and upper extremity pain impairing his ability to participate in rehabilitation.  RUE confirmed DVT.  Assessment/Plan: Principal Problem:   Chest pain and right upper extremity pain in a patient with metastatic prostate cancer / right upper extremity DVT -Initial workup included cardiac markers which were negative. Right upper extremity Dopplers ultimately revealed a DVT which, along with his malignancy, likely is the cause of his chest and right shoulder pain. No evidence of pulmonary embolism on CT angiogram. -Continue pain management with fentanyl and when necessary oxycodone and IV dilaudid. -Lovenox therapy initiated on 04/19/2013. Will hold secondary to drop in hemoglobin and platelet count of 17,000. Active Problems:   Chest tightness/chest congestion -Portable chest x-ray done 04/23/2013. No active disease. Continue Mucinex.   Hypocalcemia -Resolved status post calcium gluconate 04/19/2013. Continue calcium carbonate supplementation.   Hypokalemia /  hypomagnesemia -Monitor and replace electrolytes as needed. -Magnesium now repleted, will decrease supplementation dose to 400 mg of magnesium oxide daily.   Pancytopenia -Likely related to cancer involvement of his bone marrow per oncologist. -Watch platelet count closely since patient is on Lovenox for treatment of DVT. -Watch for infection given his low WBC count. New chest tightness and congestion noted 04/23/2013. Portable chest x-ray requested.   Hyponatremia -Urine studies consistent with SIADH. -Failed therapy with demeclocycline. Sodium beginning to improve on Samsca.   DM w/o Complication Type I -Continue 70/30, 28 units every afternoon and SSI, insulin resistant scale before meals. -CBG is 96-152.   PROSTATE CANCER, HX OF -Being followed by [name]. Not currently a candidate for further chemotherapy. -Seen by the palliative care team, continues to want aggressive treatment.   Hypertension -Continue lisinopril.   Metastasis of neoplasm to spinal canal -Currently undergoing radiation treatments to T1 and T5. -Continue Decadron.   Anemia of chronic disease -Monitor hemoglobin and transfuse for hemoglobin less than 7 mg/dL. -Status post 1 unit of packed red blood cells 04/19/2013. Hemoglobin dropped 2.1 g overnight. We'll give 1 unit of packed red blood cells today.   Protein-calorie malnutrition, moderate -Likely from cancer related cachexia. Seen by dietitian on 04/19/2013. Continue Glucerna shakes and multivitamins.  Code Status: Full. Family Communication: No family currently at bedside. Disposition Plan: SNF versus back to CIR.   Medical Consultants:  [name], Oncology.  [name], Radiation Oncology.  [name], Palliative care  Other Consultants:  Dietitian  Anti-infectives:  None.  HPI/Subjective: Andrew Nelson continues to have some right-sided chest and shoulder pain. He is relieved that his chest x-ray is clear. He  still has some congestion. Appetite fair. No nausea or vomiting. No energy.  Objective: Filed Vitals:   04/23/13 1252 04/23/13 1326 04/23/13 2222 04/24/13 0644  BP: 103/58  100/55 102/53 104/46  Pulse:  93 97 85  Temp:  98 F (36.7 C) 98.4 F (36.9 C) 97.5 F (36.4 C)  TempSrc:  Oral Oral Oral  Resp:  16 18 16   Height:      Weight:      SpO2:  99% 99% 97%    Intake/Output Summary (Last 24 hours) at 04/24/13 0755 Last data filed at 04/24/13 0600  Gross per 24 hour  Intake    640 ml  Output    750 ml  Net   -110 ml    Exam: Gen:  NAD Cardiovascular:  Regular rate, and rhythm. Respiratory:  Lungs CTAB Gastrointestinal:  Abdomen soft with distended, nontender, + BS Extremities: No pedal edema, right upper extremity swollen  Data Reviewed: Basic Metabolic Panel:  Recent Labs Lab 04/19/13 0625 04/19/13 0840 04/20/13 0510 04/21/13 0535 04/22/13 0605 04/22/13 2120 04/23/13 0630 04/23/13 1228 04/24/13 0550  NA 116* 123* 117* 117* 118* 124* 123* 123* 123*  K 3.6 2.5* 5.1 4.8 4.5  --  4.5  --  4.7  CL 86* 98 90* 89* 91*  --  93*  --  93*  CO2 24 18* 21 21 22   --  23  --  24  GLUCOSE 104* 96 115* 120* 138*  --  113*  --  100*  BUN 31* 25* 23 17 24*  --  19  --  25*  CREATININE 0.41* 0.29* 0.34* 0.33* 0.31*  --  0.29*  --  0.31*  CALCIUM 8.1* 6.0* 8.4 8.4 8.1*  --  8.6  --  8.1*  MG  --  1.4*  --   --  2.0  --   --   --   --    GFR Estimated Creatinine Clearance: 90 ml/min (by C-G formula based on Cr of 0.31). Liver Function Tests:  Recent Labs Lab 04/18/13 1859  AST 41*  ALT 28  ALKPHOS 87  BILITOT 0.5  PROT 4.8*  ALBUMIN 2.3*   Coagulation profile  Recent Labs Lab 04/18/13 2345  INR 1.08    CBC:  Recent Labs Lab 04/20/13 0510 04/21/13 0535 04/22/13 0605 04/23/13 0630 04/24/13 0550  WBC 1.4* 0.9* 0.9* 1.1* 1.6*  HGB 10.0* 9.8* 9.1* 9.5* 7.1*  HCT 28.4* 27.8* 26.3* 26.9* 20.4*  MCV 78.2 78.5 79.5 79.1 80.0  PLT 36* 29* 31* 24* 17*    Cardiac Enzymes:  Recent Labs Lab 04/18/13 1859 04/19/13  TROPONINI <0.30 <0.30   CBG:  Recent Labs Lab 04/23/13 0750 04/23/13 1245 04/23/13 1723 04/23/13 2219 04/24/13 0730  GLUCAP 109* 151* 152* 107* 96   Microbiology Recent Results (from the past 240 hour(s))  MRSA PCR SCREENING     Status: None   Collection Time    04/18/13 11:00 PM      Result Value Range Status   MRSA by PCR NEGATIVE  NEGATIVE Final   Comment:            The GeneXpert MRSA Assay (FDA     approved for NASAL specimens     only), is one component of a     comprehensive MRSA colonization     surveillance program. It is not     intended to diagnose MRSA     infection nor to guide or     monitor treatment for     MRSA infections.     Procedures and Diagnostic Studies:.  Dg Chest Port 1 View 04/23/2013   CLINICAL DATA:  Chest  tightness  EXAM: PORTABLE CHEST - 1 VIEW  COMPARISON:  CT chest 04/19/2013  FINDINGS: The heart size and mediastinal contours are within normal limits. Both lungs are clear. The visualized skeletal structures are unremarkable. Mild left basilar atelectasis. Scattered mild right basilar atelectasis. There is right IJ Port-A-Cath with tip in SVC right atrium junction. No diagnostic pneumothorax.  IMPRESSION: No active disease. Right IJ Port-A-Cath in place.   Electronically Signed   By: Natasha Mead   On: 04/23/2013 11:22    Right upper extremity venous duplex 04/19/2013: Summary: Findings consistent with acute DVT involving the right axillary vein is superficial thrombus in the cephalic vein at the elbow.  Scheduled Meds: . calcium carbonate  1 tablet Oral BID WC  . dexamethasone  4 mg Oral QID  . doxazosin  8 mg Oral QHS  . enoxaparin (LOVENOX) injection  70 mg Subcutaneous Q12H  . feeding supplement  237 mL Oral TID BM  . fentaNYL  100 mcg Transdermal Q72H  . guaiFENesin  600 mg Oral BID  . insulin aspart  0-20 Units Subcutaneous TID WC  . insulin aspart protamine- aspart   28 Units Subcutaneous Q supper  . lisinopril  10 mg Oral Daily  . magnesium oxide  400 mg Oral Daily  . multivitamin with minerals  1 tablet Oral Daily  . pantoprazole  40 mg Oral QHS  . polyethylene glycol  17 g Oral Daily  . senna  1 tablet Oral BID  . tolvaptan  15 mg Oral Q24H   Continuous Infusions: . sodium chloride 20 mL/hr (04/24/13 0223)    Time spent: 25 minutes.   LOS: 6 days   Jihad Brownlow  [facility] Pager [phone].   *Please note that the hospitalists switch teams on Wednesdays. Please call the flow manager at [phone] if you are having difficulty reaching the hospitalist taking care of this patient as she can update you and provide the most up-to-date pager number of provider caring for the patient. If 8PM-8AM, please contact night-coverage at [email or web address], password [facility]  04/24/2013, 7:55 AM

## 2013-04-24 NOTE — Progress Notes (Signed)
IP PROGRESS NOTE  Subjective:   No changes noted. Pain still there but manageable. No bleeding noted.    Objective:  Vital signs in last 24 hours: Temp:  [97.5 F (36.4 C)-98.4 F (36.9 C)] 97.5 F (36.4 C) (09/15 0644) Pulse Rate:  [85-97] 85 (09/15 0644) Resp:  [16-18] 16 (09/15 0644) BP: (100-104)/(46-58) 104/46 mmHg (09/15 0644) SpO2:  [97 %-99 %] 97 % (09/15 0644) Weight change:  Last BM Date: 04/22/13  Intake/Output from previous day: 09/14 0701 - 09/15 0700 In: 880 [P.O.:720; I.V.:160] Out: 750 [Urine:750]  Mouth: mucous membranes moist, pharynx normal without lesions Resp: clear to auscultation bilaterally Cardio: regular rate and rhythm, S1, S2 normal, no murmur, click, rub or gallop GI: soft, non-tender; bowel sounds normal; no masses,  no organomegaly Extremities: extremities normal, atraumatic, no cyanosis or edema    Lab Results:  Recent Labs  04/23/13 0630 04/24/13 0550  WBC 1.1* 1.6*  HGB 9.5* 7.1*  HCT 26.9* 20.4*  PLT 24* 17*    BMET  Recent Labs  04/23/13 0630 04/23/13 1228 04/24/13 0550  NA 123* 123* 123*  K 4.5  --  4.7  CL 93*  --  93*  CO2 23  --  24  GLUCOSE 113*  --  100*  BUN 19  --  25*  CREATININE 0.29*  --  0.31*  CALCIUM 8.6  --  8.1*     Medications: I have reviewed the patient's current medications.  Assessment/Plan:  62 year old with the following issues:  1. Advanced prostate cancer status post multiple treatments currently receiving radiation therapy to his multiple spinal lesions. He is undergoing radiation at this time. No plans for chemotherapy at this time.   2. DVT without a PE on CT scan: I agree with holding Lovenox treatment due to thrombocytopenia for now.   3. Pancytopenia: Likely related to cancer involvement in his bone marrow which really a poor prognostic feature.  I will continue to follow.    LOS: 6 days   [facility] 04/24/2013, 10:16 AM

## 2013-04-24 NOTE — Progress Notes (Signed)
04/24/13 1300  PT Visit Information  Last PT Received On 04/24/13  Reason Eval/Treat Not Completed Patient at procedure or test/unavailable

## 2013-04-24 NOTE — Progress Notes (Signed)
CRITICAL VALUE ALERT  Critical value received:  Platelet 17  Date of notification:  04/24/13  Time of notification:  0606  Critical value read back:yes  Nurse who received alert:  Hedda Slade  MD notified (1st page):  Hosptialist  Time of first page:  0610  MD notified (2nd page):  Time of second page:  Responding MD:  Hospitalist  Time MD responded:  [phone]

## 2013-04-24 NOTE — Progress Notes (Signed)
CSW met with pt at bedside to provide SNF bed offers.  Pt wife not present at this time.   CSW to follow up with pt and pt wife tomorrow re: disposition planning.   [name], MSW, LCSWA  Clinical Social Work [phone]

## 2013-04-24 NOTE — Progress Notes (Signed)
Progress Note from the Palliative Medicine Team at [facility]  Subjective: patient is resting comfortably, wife at bedside  -conversation reiterating the role of Palliative , we discussed the importance of planning for anticipatory care needs both emotional and physical.  -discussed the use of AD forms, ie MOST   -briefly discussed option of residential and home hospice   Objective: Allergies  Allergen Reactions  . Aprindine Other (See Comments)    unknown  . Bc Powder [Aspirin-Salicylamide-Caffeine] Hives  . Penicillins Hives   Scheduled Meds: . calcium carbonate  1 tablet Oral BID WC  . dexamethasone  4 mg Oral QID  . doxazosin  8 mg Oral QHS  . feeding supplement  237 mL Oral TID BM  . fentaNYL  100 mcg Transdermal Q72H  . guaiFENesin  600 mg Oral BID  . insulin aspart  0-20 Units Subcutaneous TID WC  . insulin aspart protamine- aspart  28 Units Subcutaneous Q supper  . lisinopril  10 mg Oral Daily  . magnesium oxide  400 mg Oral Daily  . multivitamin with minerals  1 tablet Oral Daily  . pantoprazole  40 mg Oral QHS  . polyethylene glycol  17 g Oral Daily  . senna  1 tablet Oral BID  . tolvaptan  15 mg Oral Q24H   Continuous Infusions: . sodium chloride 20 mL/hr (04/24/13 0223)   PRN Meds:.bisacodyl, diazepam, guaiFENesin-dextromethorphan, HYDROmorphone (DILAUDID) injection, lidocaine-prilocaine, ondansetron (ZOFRAN) IV, ondansetron, oxyCODONE  BP 104/46  Pulse 85  Temp(Src) 97.5 F (36.4 C) (Oral)  Resp 16  Ht 5' 7.5" (1.715 m)  Wt 73.5 kg (162 lb 0.6 oz)  BMI 24.99 kg/m2  SpO2 97%   PPS: 30 % at best     Intake/Output Summary (Last 24 hours) at 04/24/13 1457 Last data filed at 04/24/13 0700  Gross per 24 hour  Intake    400 ml  Output    350 ml  Net     50 ml       Physical Exam:  General: ill appearing, NAD HEENT:  +temproal muscle wasting, mm  Chest:   Decreased in bases CVS: RRR Abdomen:soft NT +BS Ext:  BLE with out edema, right UE   Neuro:lethargic  Labs: CBC    Component Value Date/Time   WBC 1.6* 04/24/2013 0550   WBC 14.0* 03/29/2013 1557   RBC 2.55* 04/24/2013 0550   RBC 4.68 03/29/2013 1557   HGB 7.1* 04/24/2013 0550   HGB 12.3* 03/29/2013 1557   HCT 20.4* 04/24/2013 0550   HCT 38.2* 03/29/2013 1557   PLT 17* 04/24/2013 0550   PLT Clumped Platelets--Appears Adequate 03/29/2013 1557   MCV 80.0 04/24/2013 0550   MCV 81.6 03/29/2013 1557   MCH 27.8 04/24/2013 0550   MCH 26.3* 03/29/2013 1557   MCHC 34.8 04/24/2013 0550   MCHC 32.2 03/29/2013 1557   RDW 17.1* 04/24/2013 0550   RDW 18.6* 03/29/2013 1557   LYMPHSABS 0.2* 04/06/2013 0627   LYMPHSABS 0.6* 03/29/2013 1557   MONOABS 0.3 04/06/2013 0627   MONOABS 0.9 03/29/2013 1557   EOSABS 0.0 04/06/2013 0627   EOSABS 0.0 03/29/2013 1557   BASOSABS 0.0 04/06/2013 0627   BASOSABS 0.0 03/29/2013 1557    BMET    Component Value Date/Time   NA 123* 04/24/2013 0550   NA 138 02/28/2013 1216   NA 141 08/13/2011 1129   K 4.7 04/24/2013 0550   K 3.7 02/28/2013 1216   K 3.7 08/13/2011 1129   CL 93* 04/24/2013 0550  CL 100 01/17/2013 1102   CL 103 08/13/2011 1129   CO2 24 04/24/2013 0550   CO2 26 02/28/2013 1216   CO2 28 08/13/2011 1129   GLUCOSE 100* 04/24/2013 0550   GLUCOSE 122 02/28/2013 1216   GLUCOSE 135* 01/17/2013 1102   GLUCOSE 161* 08/13/2011 1129   BUN 25* 04/24/2013 0550   BUN 11.4 02/28/2013 1216   BUN 15 08/13/2011 1129   CREATININE 0.31* 04/24/2013 0550   CREATININE 0.7 02/28/2013 1216   CREATININE 0.5* 08/13/2011 1129   CALCIUM 8.1* 04/24/2013 0550   CALCIUM 10.5* 02/28/2013 1216   CALCIUM 9.3 08/13/2011 1129   GFRNONAA >90 04/24/2013 0550   GFRAA >90 04/24/2013 0550    CMP     Component Value Date/Time   NA 123* 04/24/2013 0550   NA 138 02/28/2013 1216   NA 141 08/13/2011 1129   K 4.7 04/24/2013 0550   K 3.7 02/28/2013 1216   K 3.7 08/13/2011 1129   CL 93* 04/24/2013 0550   CL 100 01/17/2013 1102   CL 103 08/13/2011 1129   CO2 24 04/24/2013 0550   CO2 26 02/28/2013 1216   CO2 28  08/13/2011 1129   GLUCOSE 100* 04/24/2013 0550   GLUCOSE 122 02/28/2013 1216   GLUCOSE 135* 01/17/2013 1102   GLUCOSE 161* 08/13/2011 1129   BUN 25* 04/24/2013 0550   BUN 11.4 02/28/2013 1216   BUN 15 08/13/2011 1129   CREATININE 0.31* 04/24/2013 0550   CREATININE 0.7 02/28/2013 1216   CREATININE 0.5* 08/13/2011 1129   CALCIUM 8.1* 04/24/2013 0550   CALCIUM 10.5* 02/28/2013 1216   CALCIUM 9.3 08/13/2011 1129   PROT 4.8* 04/18/2013 1859   PROT 7.0 02/28/2013 1216   PROT 7.2 08/13/2011 1129   ALBUMIN 2.3* 04/18/2013 1859   ALBUMIN 3.0* 02/28/2013 1216   AST 41* 04/18/2013 1859   AST 23 02/28/2013 1216   AST 12 08/13/2011 1129   ALT 28 04/18/2013 1859   ALT 7 02/28/2013 1216   ALT 19 08/13/2011 1129   ALKPHOS 87 04/18/2013 1859   ALKPHOS 90 02/28/2013 1216   ALKPHOS 76 08/13/2011 1129   BILITOT 0.5 04/18/2013 1859   BILITOT 0.49 02/28/2013 1216   BILITOT 0.50 08/13/2011 1129   GFRNONAA >90 04/24/2013 0550   GFRAA >90 04/24/2013 0550     Assessment and Plan: 1. Code Status: Full code 2. Symptom Control:      Anxiety- Valium 5 -10 mg every 12 hrs prn      Pain: Fentanyl patch       Weakness/failue to thrive 3. Psycho/Social: Emotional support offered to wife at bedside,  4. Disposition: Pending, Wife expressed that she believed that her husband would remain in the hospital for completion of radiation.  I did speak to her, that would not be usual and that it is important to begin to clarify goals of care within the current medical situation and treatment options.   Time In Time Out Total Time Spent with Patient Total Overall Time  1500 1530 25 min 30 min    Greater than 50%  of this time was spent counseling and coordinating care related to the above assessment and plan.  Lorinda Creed NP  Palliative Medicine Team Team Phone # [phone] Pager [phone]  Discussed with Dr Rama  1

## 2013-04-25 ENCOUNTER — Ambulatory Visit
Admit: 2013-04-25 | Discharge: 2013-04-25 | Disposition: A | Discharge status: Home / Self Care | Payer: BC Managed Care – PPO | Attending: Radiation Oncology | Admitting: Radiation Oncology

## 2013-04-25 LAB — BASIC METABOLIC PANEL
Calcium: 8.4 mg/dL (ref 8.4–10.5)
Chloride: 95 mEq/L — ABNORMAL LOW (ref 96–112)
Creatinine, Ser: 0.34 mg/dL — ABNORMAL LOW (ref 0.50–1.35)
GFR calc Af Amer: >90 mL/min (ref >90)
Sodium: 125 mEq/L — ABNORMAL LOW (ref 135–145)

## 2013-04-25 LAB — GLUCOSE, CAPILLARY
Glucose-Capillary: 112 mg/dL — ABNORMAL HIGH (ref 70–99)
Glucose-Capillary: 146 mg/dL — ABNORMAL HIGH (ref 70–99)

## 2013-04-25 LAB — CBC
Platelets: 16 10*3/uL — CL (ref 150–400)
RBC: 2.87 MIL/uL — ABNORMAL LOW (ref 4.22–5.81)
RDW: 16.1 % — ABNORMAL HIGH (ref 11.5–15.5)
WBC: 2 10*3/uL — ABNORMAL LOW (ref 4.0–10.5)

## 2013-04-25 NOTE — Progress Notes (Signed)
[facility] PROGRESS NOTE  Andrew Nelson ZOX:[id number] DOB: 1951-07-04 DOA: 04/18/2013 PCP: [name], MD  Brief narrative: Andrew Nelson is an 62 y.o. male with a PMH of metastatic castration resistant prostate cancer, status post radiation therapy and chemotherapy with Jevtana, status post T6-T7 laminectomy and resection of epidural tumor, left L5 decompressive hemilaminectomy with resection of epidural tumor on 03/13/2013 who was admitted on 03/29/2013 with progressively worsening lower extremity weakness. 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 He then had further radiation therapy and was ultimately discharged to CIR where he completed rehabilitation and was discharged from rehabilitation on 04/18/2013 secondary to worsening right-sided chest and upper extremity pain impairing his ability to participate in rehabilitation.  RUE confirmed DVT. He was initially started on Lovenox but this had to be discontinued secondary to significant anemia/thrombocytopenia. The family wanted to take the patient home, but the ability to provide the kind of care he will need is questionable. The barrier to discharge is the need for ongoing close monitoring of the patient's sodium while being treated with Samsca.  Assessment/Plan: Principal Problem:   Chest pain and right upper extremity pain in a patient with metastatic prostate cancer / right upper extremity DVT -Initial workup included cardiac markers which were negative. Right upper extremity Dopplers ultimately revealed a DVT which, along with his malignancy, likely is the cause of his chest and right shoulder pain. No evidence of pulmonary embolism on CT angiogram. -Continue pain management with fentanyl and when necessary oxycodone and IV dilaudid. -Lovenox therapy initiated on  04/19/2013. Placed on hold 04/24/2013 secondary to drop in hemoglobin and platelet count of 17,000. Active Problems:   Chest tightness/chest congestion -Portable chest x-ray done 04/23/2013. No active disease. Continue Mucinex.   Hypocalcemia -Resolved status post calcium gluconate 04/19/2013. Continue calcium carbonate supplementation.   Hypokalemia / hypomagnesemia -Monitor and replace electrolytes as needed. -Magnesium now repleted, on 400 mg of magnesium oxide daily.   Pancytopenia -Likely related to cancer involvement of his bone marrow per oncologist. -Watch for infection given his low WBC count. New chest tightness and congestion noted 04/23/2013.    Hyponatremia -Urine studies consistent with SIADH. -Failed therapy with demeclocycline. Sodium slow to improve on Samsca.   DM w/o Complication Type I -Continue 70/30, 28 units every afternoon and SSI, insulin resistant scale before meals. -CBG is 96-170.   PROSTATE CANCER, HX OF -Being followed by [name]. Not currently a candidate for further chemotherapy. -Seen by the palliative care team, continues to want aggressive treatment.   Hypertension -Continue lisinopril.   Metastasis of neoplasm to spinal canal -Currently undergoing radiation treatments to T1 and T5. -Continue Decadron.   Anemia of chronic disease -Monitor hemoglobin and transfuse for hemoglobin less than 7 mg/dL. -Status post 1 unit of packed red blood cells 04/19/2013 and again on 04/24/2013.  Protein-calorie malnutrition, moderate -Likely from cancer related cachexia. Seen by dietitian on 04/19/2013. Continue Glucerna shakes and multivitamins.  Code Status: Full. Family Communication: No family currently at bedside. Disposition Plan: SNF versus back to CIR.   Medical Consultants:  [name], Oncology.  [name], Radiation Oncology.  [name], Palliative care  Other  Consultants:  Dietitian  Anti-infectives:  None.  HPI/Subjective: [name] continues to have some right-sided chest and shoulder pain, rated 5/5 in intensity. He thinks the blood transfusion helped him  a bit and reports that his bowels have moved. No nausea or vomiting. Continues to be weak.  Objective: Filed Vitals:   04/24/13 1740 04/24/13 1820 04/24/13 2100 04/25/13 0749  BP: 95/55 102/53 95/52 100/61  Pulse:  99 110 111  Temp: 97.6 F (36.4 C) 97.7 F (36.5 C) 98.7 F (37.1 C) 97 F (36.1 C)  TempSrc: Oral Oral Oral Oral  Resp: 18 18 16 16   Height:      Weight:      SpO2: 100% 100% 98% 100%    Intake/Output Summary (Last 24 hours) at 04/25/13 7846 Last data filed at 04/25/13 0747  Gross per 24 hour  Intake    590 ml  Output   1550 ml  Net   -960 ml    Exam: Gen:  NAD Cardiovascular:  Regular rate, and rhythm. Respiratory:  Lungs CTAB Gastrointestinal:  Abdomen soft with distended, nontender, + BS Extremities: No pedal edema, right upper extremity swollen  Data Reviewed: Basic Metabolic Panel:  Recent Labs Lab 04/19/13 0625 04/19/13 0840  04/21/13 0535 04/22/13 0605 04/22/13 2120 04/23/13 0630 04/23/13 1228 04/24/13 0550 04/25/13 0605  NA 116* 123*  < > 117* 118* 124* 123* 123* 123* 125*  K 3.6 2.5*  < > 4.8 4.5  --  4.5  --  4.7 5.0  CL 86* 98  < > 89* 91*  --  93*  --  93* 95*  CO2 24 18*  < > 21 22  --  23  --  24 24  GLUCOSE 104* 96  < > 120* 138*  --  113*  --  100* 111*  BUN 31* 25*  < > 17 24*  --  19  --  25* 27*  CREATININE 0.41* 0.29*  < > 0.33* 0.31*  --  0.29*  --  0.31* 0.34*  CALCIUM 8.1* 6.0*  < > 8.4 8.1*  --  8.6  --  8.1* 8.4  MG  --  1.4*  --   --  2.0  --   --   --   --   --   < > = values in this interval not displayed. GFR Estimated Creatinine Clearance: 90 ml/min (by C-G formula based on Cr of 0.34). Liver Function Tests:  Recent Labs Lab 04/18/13 1859  AST 41*  ALT 28  ALKPHOS 87  BILITOT 0.5  PROT 4.8*   ALBUMIN 2.3*   Coagulation profile  Recent Labs Lab 04/18/13 2345  INR 1.08    CBC:  Recent Labs Lab 04/21/13 0535 04/22/13 0605 04/23/13 0630 04/24/13 0550 04/25/13 0605  WBC 0.9* 0.9* 1.1* 1.6* 2.0*  HGB 9.8* 9.1* 9.5* 7.1* 8.0*  HCT 27.8* 26.3* 26.9* 20.4* 23.1*  MCV 78.5 79.5 79.1 80.0 80.5  PLT 29* 31* 24* 17* 16*   Cardiac Enzymes:  Recent Labs Lab 04/18/13 1859 04/19/13  TROPONINI <0.30 <0.30   CBG:  Recent Labs Lab 04/24/13 0730 04/24/13 1204 04/24/13 1631 04/24/13 2059 04/25/13 0740  GLUCAP 96 152* 170* 110* 112*   Microbiology Recent Results (from the past 240 hour(s))  MRSA PCR SCREENING     Status: None   Collection Time    04/18/13 11:00 PM      Result Value Range Status   MRSA by PCR NEGATIVE  NEGATIVE Final   Comment:            The GeneXpert MRSA Assay (FDA     approved for NASAL specimens     only),  is one component of a     comprehensive MRSA colonization     surveillance program. It is not     intended to diagnose MRSA     infection nor to guide or     monitor treatment for     MRSA infections.     Procedures and Diagnostic Studies:.  Dg Chest Port 1 View 04/23/2013   CLINICAL DATA:  Chest tightness  EXAM: PORTABLE CHEST - 1 VIEW  COMPARISON:  CT chest 04/19/2013  FINDINGS: The heart size and mediastinal contours are within normal limits. Both lungs are clear. The visualized skeletal structures are unremarkable. Mild left basilar atelectasis. Scattered mild right basilar atelectasis. There is right IJ Port-A-Cath with tip in SVC right atrium junction. No diagnostic pneumothorax.  IMPRESSION: No active disease. Right IJ Port-A-Cath in place.   Electronically Signed   By: Natasha Mead   On: 04/23/2013 11:22    Right upper extremity venous duplex 04/19/2013: Summary: Findings consistent with acute DVT involving the right axillary vein is superficial thrombus in the cephalic vein at the elbow.  Scheduled Meds: . calcium carbonate  1  tablet Oral BID WC  . dexamethasone  4 mg Oral QID  . doxazosin  8 mg Oral QHS  . feeding supplement  237 mL Oral TID BM  . fentaNYL  100 mcg Transdermal Q72H  . guaiFENesin  600 mg Oral BID  . insulin aspart  0-20 Units Subcutaneous TID WC  . insulin aspart protamine- aspart  28 Units Subcutaneous Q supper  . lisinopril  10 mg Oral Daily  . magnesium oxide  400 mg Oral Daily  . multivitamin with minerals  1 tablet Oral Daily  . pantoprazole  40 mg Oral QHS  . polyethylene glycol  17 g Oral Daily  . senna  1 tablet Oral BID  . tolvaptan  15 mg Oral Q24H   Continuous Infusions: . sodium chloride 20 mL/hr (04/24/13 0223)    Time spent: 25 minutes.   LOS: 7 days   RAMA,CHRISTINA  [facility] Pager [phone].   *Please note that the hospitalists switch teams on Wednesdays. Please call the flow manager at [phone] if you are having difficulty reaching the hospitalist taking care of this patient as she can update you and provide the most up-to-date pager number of provider caring for the patient. If 8PM-8AM, please contact night-coverage at [email or web address], password [email or web address]  04/25/2013, 8:22 AM

## 2013-04-25 NOTE — Progress Notes (Addendum)
PT Cancellation Note  Patient Details Name: Andrew Nelson MRN: [id number] DOB: [birth year]   Cancelled Treatment:    Reason Eval/Treat Not Completed: Patient at procedure or test/unavailable (leaving for XRT). Will check back another time. Addendum: checked back ~3:45-pt declined to participate. Will check back another day.     Rebeca Alert, MPT Pager: [phone]

## 2013-04-25 NOTE — Progress Notes (Signed)
  This NP will meet at 0800 Wednesday morning (04-26-13) with wife at bedside to clarify GOC and options.  Lorinda Creed NP  Palliative Medicine Team Team Phone # [phone] Pager [phone]

## 2013-04-25 NOTE — Progress Notes (Signed)
CSW and RNCM met with pt and pt wife at bedside as pt and pt wife are weighing options for disposition plan between SNF vs Home with Home Health Services.  Pt and pt wife report that they are still discussing disposition plans and CSW and RNCM clarified pt and pt wife questions.  Pt wife has SNF bed offers and plans to tour facilities that have offered a bed. CSW notified pt and pt wife of the facilities that had offered a bed that would provide transportation to radiation.   RNCM provided list of resources for transportation to radiation which would be an out of pocket cost to pt and pt wife.   Pt and pt wife plan to have discussion and weigh options in order to make decision about plan for discharge.  CSW to continue to follow to assist with disposition plan.  Per MD, pt not yet medically ready for discharge.  [name], MSW, LCSWA  Clinical Social Work [phone]

## 2013-04-25 NOTE — Care Management Note (Signed)
Cm spoke with patient at bedside with CSW and spouse present concerning assessing discharge disposition. CM provided pt and spouse information concerning home health agencies in [place]. Cm informed pt and spouse [facility] services are accompanied with a co-pay per visit, maximum care provided no longer than one hour per service. Pt concerned about transportation to radiation therapy if decides to discharge home.Pt and spouse provided with list of non emergent transportation services.  Pt and spouse informed choice of SNF will require prior authorization.Pt provided with list of bed availability for SNF. Will continue to follow.  Roxy Manns Arlyne Brandes,RN,MSN [phone]

## 2013-04-26 ENCOUNTER — Ambulatory Visit
Admit: 2013-04-26 | Discharge: 2013-04-26 | Disposition: A | Discharge status: Home / Self Care | Payer: BC Managed Care – PPO | Attending: Radiation Oncology | Admitting: Radiation Oncology

## 2013-04-26 DIAGNOSIS — D638 Anemia in other chronic diseases classified elsewhere: Secondary | ICD-10-CM

## 2013-04-26 DIAGNOSIS — R627 Adult failure to thrive: Secondary | ICD-10-CM

## 2013-04-26 LAB — BASIC METABOLIC PANEL
BUN: 21 mg/dL (ref 6–23)
CO2: 26 mEq/L (ref 19–32)
Chloride: 100 mEq/L (ref 96–112)
GFR calc Af Amer: >90 mL/min (ref >90)
Glucose, Bld: 146 mg/dL — ABNORMAL HIGH (ref 70–99)
Potassium: 4.6 mEq/L (ref 3.5–5.1)

## 2013-04-26 LAB — GLUCOSE, CAPILLARY: Glucose-Capillary: 155 mg/dL — ABNORMAL HIGH (ref 70–99)

## 2013-04-26 LAB — CBC
HCT: 22.1 % — ABNORMAL LOW (ref 39.0–52.0)
Hemoglobin: 7.7 g/dL — ABNORMAL LOW (ref 13.0–17.0)
MCH: 28.5 pg (ref 26.0–34.0)
MCHC: 34.8 g/dL (ref 30.0–36.0)
MCV: 81.9 fL (ref 78.0–100.0)

## 2013-04-26 MED ORDER — GLUCERNA SHAKE PO LIQD
237.0000 mL | Freq: Two times a day (BID) | ORAL | Status: DC
  Administered 2013-04-26 – 2013-04-28 (×4): 237 mL via ORAL
  Filled 2013-04-26 (×5): qty 237

## 2013-04-26 NOTE — Progress Notes (Addendum)
CRITICAL VALUE ALERT  Critical value received:  WBC 1.0  Date of notification:  04/26/13  Time of notification:  0546  Critical value read back:yes  Nurse who received alert:  Oneita Jolly, RN  MD notified (1st page):  Maren Reamer, NP  Time of first page:  [phone]  MD notified (2nd page):  Time of second page:  Responding MD:    Time MD responded:  [phone] Neutropenic precautions ordered.

## 2013-04-26 NOTE — Progress Notes (Addendum)
[facility] PROGRESS NOTE  Andrew Nelson EAV:[id number] DOB: 09/14/1950 DOA: 04/18/2013 PCP: [name], MD  Brief narrative: 62 y.o. male with past medical history significant for metastatic castration resistant prostate cancer, status post radiation therapy and chemotherapy with Jevtana, status post T6-T7 laminectomy and resection of epidural tumor, left L5 decompressive hemilaminectomy with resection of epidural tumor on 03/13/2013 who was admitted to [facility] ED on 03/29/2013 with progressively worsening lower extremity weakness. 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 He then had further radiation therapy and was ultimately discharged to CIR where he completed rehabilitation and was discharged from rehabilitation on 04/18/2013 secondary to worsening right-sided chest and upper extremity pain impairing his ability to participate in rehabilitation. RUE confirmed DVT. He was initially started on Lovenox but this had to be discontinued secondary to significant anemia/thrombocytopenia. Hospital course is complicated due to ongoing hyponatremia and need for Tolvaptan.  Assessment/Plan:   Principal Problem:  Chest pain and right upper extremity pain in a patient with metastatic prostate cancer / right upper extremity DVT  - Right upper extremity DVT based on the right upper extremity Doppler study however patient cannot be on blood thinners due to significant anemia and thrombocytopenia related to metastatic prostate cancer. No evidence of pulmonary embolism on CT angiogram. - Of note, Cardiac markers done at the time of complaints of chest pain were negative. - Been controlled with current analgesia therefore continue fentanyl 100 mcg patch; continue Dilaudid 2 mg IV every 4 hours as needed for severe pain and oxycodone 30 mg  every 4 hours as needed by mouth for moderate pain - plan to D/C to res hospice once hyponatremia improves  Active Problems:  Hypocalcemia  - Resolved status post calcium gluconate 04/19/2013. Continue calcium carbonate supplementation.  Hypokalemia / hypomagnesemia  - Monitor and replace electrolytes as needed. Magnesium and potassium within normal limits - Continue magnesium oxide 400 mg daily Pancytopenia  - Secondary to bone marrow suppression secondary to metastatic prostate cancer - transfuse if Hgb less than 10 Hyponatremia  - Secondary to SIADH from malignancy  - Sodium 133 on 9/17 so d/ced tolvaptan DM w/o Complication Type I  - Continue insulin 70/30 mix 20 units at bedtime as well as sliding scale insulin Prostate cancer with spinal metastasis - Being followed by [name]. Not currently a candidate for further chemotherapy.  - Pt undergoing radiation treatment to T1 - T5 area, last RT Monday 05/01/13 - continue decadron  Hypertension  - Continue lisinopril.  Anemia of chronic disease  - Monitor hemoglobin and transfuse for hemoglobin less than 7 mg/dL. This am Hgb 6.9 so will give 1 unit PRBC transfusion. Pt is  status post 1 unit of packed red blood cells 04/19/2013 and again on 04/24/2013.  Protein-calorie malnutrition, moderate  - Likely from cancer related cachexia. Seen by dietitian on 04/19/2013. Continue Glucerna shakes and multivitamins.   Code Status: DNR/DNI Family Communication: No family currently at bedside.  Disposition Plan: to residential hospice once bed available   Medical Consultants:  [name], Oncology.  [name], Radiation Oncology.  [name], Palliative care Other Consultants:  Dietitian Anti-infectives:  None.  [name], MD  [facility] Pager [phone]  If 7PM-7AM, please contact night-coverage [email or web address] Password [facility] 04/26/2013, 12:29 PM   LOS: 8 days    HPI/Subjective: No overnight  events.  Objective:  Filed Vitals:   04/25/13 1440 04/25/13 2100 04/25/13 2220 04/26/13 0650  BP: 120/57 114/64 110/62 110/72  Pulse: 104 113  101  Temp: 98.3 F (36.8 C) 98.6 F (37 C)  97.9 F (36.6 C)  TempSrc: Oral Oral  Oral  Resp: 18 20  20   Height:      Weight:      SpO2: 98% 97%  99%    Intake/Output Summary (Last 24 hours) at 04/26/13 1229 Last data filed at 04/26/13 0856  Gross per 24 hour  Intake 1411.67 ml  Output   3800 ml  Net -2388.33 ml    Exam:   General:  Pt is alert, follows commands appropriately, not in acute distress  Cardiovascular: Regular rate and rhythm, S1/S2 appreciated  Respiratory: Clear to auscultation bilaterally, no wheezing, no crackles, no rhonchi  Abdomen: Abdomen softly distended, bowel sounds present  Extremities: No edema, pulses DP and PT palpable bilaterally  Neuro: Grossly nonfocal  Data Reviewed: Basic Metabolic Panel:  Recent Labs Lab 04/22/13 0605  04/23/13 0630 04/23/13 1228 04/24/13 0550 04/25/13 0605 04/26/13 0525  NA 118*  < > 123* 123* 123* 125* 133*  K 4.5  --  4.5  --  4.7 5.0 4.6  CL 91*  --  93*  --  93* 95* 100  CO2 22  --  23  --  24 24 26   GLUCOSE 138*  --  113*  --  100* 111* 146*  BUN 24*  --  19  --  25* 27* 21  CREATININE 0.31*  --  0.29*  --  0.31* 0.34* 0.32*  CALCIUM 8.1*  --  8.6  --  8.1* 8.4 8.6  MG 2.0  --   --   --   --   --   --   < > = values in this interval not displayed. Liver Function Tests: No results found for this basename: AST, ALT, ALKPHOS, BILITOT, PROT, ALBUMIN,  in the last 168 hours No results found for this basename: LIPASE, AMYLASE,  in the last 168 hours No results found for this basename: AMMONIA,  in the last 168 hours CBC:  Recent Labs Lab 04/22/13 0605 04/23/13 0630 04/24/13 0550 04/25/13 0605 04/26/13 0525  WBC 0.9* 1.1* 1.6* 2.0* 1.0*  HGB 9.1* 9.5* 7.1* 8.0* 7.7*  HCT 26.3* 26.9* 20.4* 23.1* 22.1*  MCV 79.5 79.1 80.0 80.5 81.9  PLT 31* 24* 17* 16*  14*   Cardiac Enzymes: No results found for this basename: CKTOTAL, CKMB, CKMBINDEX, TROPONINI,  in the last 168 hours BNP: No components found with this basename: POCBNP,  CBG:  Recent Labs Lab 04/25/13 1150 04/25/13 1733 04/25/13 2053 04/26/13 0726 04/26/13 1134  GLUCAP 139* 185* 146* 134* 155*    MRSA PCR SCREENING     Status: None   Collection Time    04/18/13 11:00 PM      Result Value Range Status   MRSA by PCR NEGATIVE  NEGATIVE Final     Studies: No results found.  Scheduled Meds: . calcium carbonate  1 tablet Oral BID WC  . dexamethasone  4 mg Oral QID  . doxazosin  8 mg Oral QHS  . fentaNYL  100 mcg Transdermal Q72H  . guaiFENesin  600 mg Oral BID  . insulin aspart  0-20 Units Subcutaneous TID WC  . insulin aspart protamine-  28 Units Subcutaneous Q supper  . lisinopril  10 mg Oral Daily  . magnesium oxide  400 mg Oral  Daily  . multivitamin  1 tablet Oral Daily  . pantoprazole  40 mg Oral QHS  . polyethylene glycol  17 g Oral Daily  . senna  1 tablet Oral BID  . tolvaptan  15 mg Oral Q24H   Continuous Infusions: . sodium chloride 20 mL/hr at 04/26/13 [id number]

## 2013-04-26 NOTE — Progress Notes (Signed)
CSW received referral for residential hospice placement.  CSW met with Andrew Nelson and Andrew Nelson wife, and Andrew Nelson sister-in-law at bedside.   CSW provided residential hospice list and Andrew Nelson family first choice is [facility] and if Andrew Nelson and Andrew Nelson family have to look into secondary option because of no bed availability at [facility] then Andrew Nelson and Andrew Nelson family agreeable to [facility] [employer].  Referrals made to [facility] and [facility] [place].  CSW to continue to follow and facilitate Andrew Nelson discharge needs when Andrew Nelson medically ready and residential hospice bed available.  [name], MSW, LCSWA  Clinical Social Work [phone]

## 2013-04-26 NOTE — Progress Notes (Signed)
Events noted in the last few days,  I agree with DNR and residential hospice placement upon discharge. This was discussed today with the patient.  He is comfortable this afternoon and seems his pain is controlled.

## 2013-04-26 NOTE — Progress Notes (Signed)
Physical Therapy Treatment Patient Details Name: Andrew Nelson MRN: [id number] DOB: 1950/10/30 Today's Date: 04/26/2013 Time: 6433-2951 PT Time Calculation (min): 15 min  PT Assessment / Plan / Recommendation  History of Present Illness pt with pancytopenia, considering plans for residential hospice   PT Comments   Pt is limited by pain and fatigue  Follow Up Recommendations        Does the patient have the potential to tolerate intense rehabilitation     Barriers to Discharge        Equipment Recommendations       Recommendations for Other Services    Frequency     Progress towards PT Goals Progress towards PT goals: Not progressing toward goals - comment  Plan Current plan remains appropriate    Precautions / Restrictions     Pertinent Vitals/Pain C/o pain in back with mobility    Mobility  Bed Mobility Bed Mobility: Rolling Left Rolling Left: 3: Mod assist Sit to Sidelying Left: Patient Percentage: 10% Details for Bed Mobility Assistance: Pt cued for technique to reach for rail and assit with rolling Transfers Transfers: Not assessed Ambulation/Gait Ambulation/Gait Assistance: Not tested (comment) Stairs: No Wheelchair Mobility Wheelchair Mobility: No    Exercises General Exercises - Upper Extremity Shoulder Flexion: AROM;Left;AAROM;Right;5 reps;Supine (edema present in RUE) Elbow Extension: AROM;Right;5 reps;Supine General Exercises - Lower Extremity Ankle Circles/Pumps: Both;10 reps;Supine;AROM Quad Sets: AROM;Both;5 reps;Supine Short Arc Quad: AAROM;Right;AROM;Left;5 reps;Supine Hip ABduction/ADduction: AAROM;Both;5 reps;Supine Hip Flexion/Marching: AAROM;Both;5 reps;Supine   PT Diagnosis:    PT Problem List:   PT Treatment Interventions:     PT Goals (current goals can now be found in the care plan section)    Visit Information  Last PT Received On: 04/26/13 Assistance Needed: +1 History of Present Illness: pt with pancytopenia, considering  plans for residential hospice    Subjective Data      Cognition  Cognition Arousal/Alertness: Awake/alert Behavior During Therapy: WFL for tasks assessed/performed Overall Cognitive Status: Within Functional Limits for tasks assessed Memory: Decreased short-term memory    Balance     End of Session PT - End of Session Activity Tolerance: Patient limited by fatigue;Patient limited by pain Patient left: in bed;with call bell/phone within reach   GP    Teresa K. [place], [place] [phone] 04/26/2013, 4:48 PM

## 2013-04-26 NOTE — Progress Notes (Signed)
NUTRITION FOLLOW UP  Intervention:   - Pt eating well and drinking nutritional shakes, no nutritional concerns at this time, nutrition signing off   Nutrition Dx:   Inadequate oral intake related to poor appetite as evidenced by pt eating 10% of meals and 6% wt loss in less than 2 weeks - resolved   Goal:   Pt to meet >/= 90% of their estimated nutrition needs - likely met with PO and supplement intake   Assessment:   Pt discussed during multidisciplinary rounds.   Met with pt who reports eating at least 50% of breakfast and lunch daily and consuming 100% of Glucerna shakes TID however medication history shows pt only consuming 2 per day. PO intake 75-85% of breakfast and lunch the past several days. Pt requesting to rest before radiation. Noted pt with low sodium. Pt's weight up 4 pounds since admission, likely r/t worsening RUE edema. Palliative care following.   Height: Ht Readings from Last 1 Encounters:  04/18/13 5' 7.5" (1.715 m)    Weight Status:   Wt Readings from Last 1 Encounters:  04/21/13 162 lb 0.6 oz (73.5 kg)    Re-estimated needs:  Kcal: 1800-2000  Protein: 100-110 grams  Fluid: 2.1 L/day  Skin: Stage 2 pressure ulcer on buttocks, +2 RUE edema, non-pitting LUE, RLE, LLE edema  Diet Order: Carb Control   Intake/Output Summary (Last 24 hours) at 04/26/13 0938 Last data filed at 04/26/13 0856  Gross per 24 hour  Intake 1651.67 ml  Output   4100 ml  Net -2448.33 ml    Last BM: 9/16   Labs:   Recent Labs Lab 04/22/13 0605  04/24/13 0550 04/25/13 0605 04/26/13 0525  NA 118*  < > 123* 125* 133*  K 4.5  < > 4.7 5.0 4.6  CL 91*  < > 93* 95* 100  CO2 22  < > 24 24 26   BUN 24*  < > 25* 27* 21  CREATININE 0.31*  < > 0.31* 0.34* 0.32*  CALCIUM 8.1*  < > 8.1* 8.4 8.6  MG 2.0  --   --   --   --   GLUCOSE 138*  < > 100* 111* 146*  < > = values in this interval not displayed.  CBG (last 3)   Recent Labs  04/25/13 1733 04/25/13 2053  04/26/13 0726  GLUCAP 185* 146* 134*    Scheduled Meds: . calcium carbonate  1 tablet Oral BID WC  . dexamethasone  4 mg Oral QID  . doxazosin  8 mg Oral QHS  . feeding supplement  237 mL Oral TID BM  . fentaNYL  100 mcg Transdermal Q72H  . guaiFENesin  600 mg Oral BID  . insulin aspart  0-20 Units Subcutaneous TID WC  . insulin aspart protamine- aspart  28 Units Subcutaneous Q supper  . lisinopril  10 mg Oral Daily  . magnesium oxide  400 mg Oral Daily  . multivitamin with minerals  1 tablet Oral Daily  . pantoprazole  40 mg Oral QHS  . polyethylene glycol  17 g Oral Daily  . senna  1 tablet Oral BID  . tolvaptan  15 mg Oral Q24H    Continuous Infusions: . sodium chloride 20 mL/hr at 04/26/13 0832    Levon Hedger MS, RD, LDN [phone] Pager [phone] After Hours Pager

## 2013-04-26 NOTE — Progress Notes (Signed)
OT Cancellation Note  Patient Details Name: VINICIUS BROCKMAN MRN: [id number] DOB: 1951-01-22   Cancelled Treatment:    Reason Eval/Treat Not Completed: Fatigue/lethargy limiting ability to participate;Other (comment) (pt wants to save energy for radiation)  Apryle Stowell, [place] Kung 04/26/2013, 8:29 AM

## 2013-04-26 NOTE — Progress Notes (Signed)
Progress Note from the Palliative Medicine Team at [facility]  Subjective:  Patient is alert and oriented, wife at bedside  Continued conversation regarding diagnosis prognosis, GOC, EOL wishes disposition and options.  A detailed discussion was had today regarding advanced directives.  Concepts specific to code status, artifical feeding and hydration, continued IV antibiotics and rehospitalization was had.  The difference between a aggressive medical intervention path  and a palliative comfort care path for this patient at this time was had.  Values and goals of care important to patient and family were attempted to be elicited.  Concept of Hospice and Palliative Care were discussed  Natural trajectory and expectations at EOL were discussed.  Questions and concerns addressed.  Hard Choices booklet left for review. Family encouraged to call with questions or concerns.  PMT will continue to support holistically.   Objective: Allergies  Allergen Reactions  . Aprindine Other (See Comments)    unknown  . Bc Powder [Aspirin-Salicylamide-Caffeine] Hives  . Penicillins Hives   Scheduled Meds: . calcium carbonate  1 tablet Oral BID WC  . dexamethasone  4 mg Oral QID  . doxazosin  8 mg Oral QHS  . feeding supplement  237 mL Oral TID BM  . fentaNYL  100 mcg Transdermal Q72H  . guaiFENesin  600 mg Oral BID  . insulin aspart  0-20 Units Subcutaneous TID WC  . insulin aspart protamine- aspart  28 Units Subcutaneous Q supper  . lisinopril  10 mg Oral Daily  . magnesium oxide  400 mg Oral Daily  . multivitamin with minerals  1 tablet Oral Daily  . pantoprazole  40 mg Oral QHS  . polyethylene glycol  17 g Oral Daily  . senna  1 tablet Oral BID  . tolvaptan  15 mg Oral Q24H   Continuous Infusions: . sodium chloride 20 mL/hr (04/24/13 0223)   PRN Meds:.bisacodyl, diazepam, guaiFENesin-dextromethorphan, HYDROmorphone (DILAUDID) injection, lidocaine-prilocaine, ondansetron (ZOFRAN) IV,  ondansetron, oxyCODONE  BP 110/72  Pulse 101  Temp(Src) 97.9 F (36.6 C) (Oral)  Resp 20  Ht 5' 7.5" (1.715 m)  Wt 73.5 kg (162 lb 0.6 oz)  BMI 24.99 kg/m2  SpO2 99%   PPS:30 % at best  Pain Score: reorts pain is controlled on present medcitions   Intake/Output Summary (Last 24 hours) at 04/26/13 0819 Last data filed at 04/26/13 0536  Gross per 24 hour  Intake 1531.67 ml  Output   4100 ml  Net -2568.33 ml       Physical Exam:  General: ill appearing, NAD  HEENT: +temproal muscle wasting, mm  Skin: pale warm and dry Chest: Decreased in bases, CTA CVS: RRR  Abdomen:soft NT +BS  Ext: BLE with outedema, right UE edemodous Neuro:lethargic   Labs: CBC    Component Value Date/Time   WBC 1.0* 04/26/2013 0525   WBC 14.0* 03/29/2013 1557   RBC 2.70* 04/26/2013 0525   RBC 4.68 03/29/2013 1557   HGB 7.7* 04/26/2013 0525   HGB 12.3* 03/29/2013 1557   HCT 22.1* 04/26/2013 0525   HCT 38.2* 03/29/2013 1557   PLT 14* 04/26/2013 0525   PLT Clumped Platelets--Appears Adequate 03/29/2013 1557   MCV 81.9 04/26/2013 0525   MCV 81.6 03/29/2013 1557   MCH 28.5 04/26/2013 0525   MCH 26.3* 03/29/2013 1557   MCHC 34.8 04/26/2013 0525   MCHC 32.2 03/29/2013 1557   RDW 16.6* 04/26/2013 0525   RDW 18.6* 03/29/2013 1557   LYMPHSABS 0.2* 04/06/2013 0627   LYMPHSABS 0.6* 03/29/2013  1557   MONOABS 0.3 04/06/2013 0627   MONOABS 0.9 03/29/2013 1557   EOSABS 0.0 04/06/2013 0627   EOSABS 0.0 03/29/2013 1557   BASOSABS 0.0 04/06/2013 0627   BASOSABS 0.0 03/29/2013 1557    BMET    Component Value Date/Time   NA 133* 04/26/2013 0525   NA 138 02/28/2013 1216   NA 141 08/13/2011 1129   K 4.6 04/26/2013 0525   K 3.7 02/28/2013 1216   K 3.7 08/13/2011 1129   CL 100 04/26/2013 0525   CL 100 01/17/2013 1102   CL 103 08/13/2011 1129   CO2 26 04/26/2013 0525   CO2 26 02/28/2013 1216   CO2 28 08/13/2011 1129   GLUCOSE 146* 04/26/2013 0525   GLUCOSE 122 02/28/2013 1216   GLUCOSE 135* 01/17/2013 1102   GLUCOSE 161* 08/13/2011 1129    BUN 21 04/26/2013 0525   BUN 11.4 02/28/2013 1216   BUN 15 08/13/2011 1129   CREATININE 0.32* 04/26/2013 0525   CREATININE 0.7 02/28/2013 1216   CREATININE 0.5* 08/13/2011 1129   CALCIUM 8.6 04/26/2013 0525   CALCIUM 10.5* 02/28/2013 1216   CALCIUM 9.3 08/13/2011 1129   GFRNONAA >90 04/26/2013 0525   GFRAA >90 04/26/2013 0525    CMP     Component Value Date/Time   NA 133* 04/26/2013 0525   NA 138 02/28/2013 1216   NA 141 08/13/2011 1129   K 4.6 04/26/2013 0525   K 3.7 02/28/2013 1216   K 3.7 08/13/2011 1129   CL 100 04/26/2013 0525   CL 100 01/17/2013 1102   CL 103 08/13/2011 1129   CO2 26 04/26/2013 0525   CO2 26 02/28/2013 1216   CO2 28 08/13/2011 1129   GLUCOSE 146* 04/26/2013 0525   GLUCOSE 122 02/28/2013 1216   GLUCOSE 135* 01/17/2013 1102   GLUCOSE 161* 08/13/2011 1129   BUN 21 04/26/2013 0525   BUN 11.4 02/28/2013 1216   BUN 15 08/13/2011 1129   CREATININE 0.32* 04/26/2013 0525   CREATININE 0.7 02/28/2013 1216   CREATININE 0.5* 08/13/2011 1129   CALCIUM 8.6 04/26/2013 0525   CALCIUM 10.5* 02/28/2013 1216   CALCIUM 9.3 08/13/2011 1129   PROT 4.8* 04/18/2013 1859   PROT 7.0 02/28/2013 1216   PROT 7.2 08/13/2011 1129   ALBUMIN 2.3* 04/18/2013 1859   ALBUMIN 3.0* 02/28/2013 1216   AST 41* 04/18/2013 1859   AST 23 02/28/2013 1216   AST 12 08/13/2011 1129   ALT 28 04/18/2013 1859   ALT 7 02/28/2013 1216   ALT 19 08/13/2011 1129   ALKPHOS 87 04/18/2013 1859   ALKPHOS 90 02/28/2013 1216   ALKPHOS 76 08/13/2011 1129   BILITOT 0.5 04/18/2013 1859   BILITOT 0.49 02/28/2013 1216   BILITOT 0.50 08/13/2011 1129   GFRNONAA >90 04/26/2013 0525   GFRAA >90 04/26/2013 0525     Assessment and Plan: 1. Code Status: DNR/DNI 2. Symptom Control:            Anxiety- Valium 5 -10 mg every 12 hrs prn             Pain: Fentanyl patch 100 mcg/Dilaudid 1-2 mg IV every 4 hrs prn/             Weakness/failue to thrive: once dc from hospital focus is comfort, quality and dignity  3. Psycho/Social:  Patient and wife tell me they only now have  come to fully understand the overall poor prognosis.  We discussed the concept of metastatic disease.  They  both understand there are no real options for treatment at this time.  The patient reports peace with the situation and tells me he has begun "to make arrangements"  4. Spiritual  Strong community church support. 5. Disposition:  Hopeful for residential hospice.  Both patient and wife understand that prognosis is very limited, likely a few weeks with the focus of full comfort in place, with no rehospitalization, no further radiation, lab draws, fluids.  He is assured that his pain will be managed aggressively.  Patient Documents Completed or Given: Document Given Completed  Advanced Directives Pkt    MOST    DNR    Gone from My Sight    Hard Choices yes     Time In Time Out Total Time Spent with Patient Total Overall Time  0745 0900 60 min75 min     Greater than 50%  of this time was spent counseling and coordinating care related to the above assessment and plan.  Lorinda Creed NP  Palliative Medicine Team Team Phone # [phone] Pager [phone]  Discussed with Dr Elisabeth Pigeon 1

## 2013-04-27 ENCOUNTER — Encounter: Payer: Self-pay | Admitting: Radiation Oncology

## 2013-04-27 ENCOUNTER — Ambulatory Visit
Admit: 2013-04-27 | Discharge: 2013-04-27 | Disposition: A | Discharge status: Home / Self Care | Payer: BC Managed Care – PPO | Attending: Radiation Oncology | Admitting: Radiation Oncology

## 2013-04-27 VITALS — BP 116/68 | HR 106 | Temp 98.8°F | Resp 20

## 2013-04-27 DIAGNOSIS — E109 Type 1 diabetes mellitus without complications: Secondary | ICD-10-CM

## 2013-04-27 DIAGNOSIS — R29898 Other symptoms and signs involving the musculoskeletal system: Secondary | ICD-10-CM

## 2013-04-27 LAB — GLUCOSE, CAPILLARY: Glucose-Capillary: 176 mg/dL — ABNORMAL HIGH (ref 70–99)

## 2013-04-27 LAB — CBC
MCV: 83 fL (ref 78.0–100.0)
Platelets: 12 10*3/uL — CL (ref 150–400)
RBC: 2.41 MIL/uL — ABNORMAL LOW (ref 4.22–5.81)
WBC: 1.7 10*3/uL — ABNORMAL LOW (ref 4.0–10.5)

## 2013-04-27 LAB — BASIC METABOLIC PANEL
CO2: 26 mEq/L (ref 19–32)
Calcium: 8.6 mg/dL (ref 8.4–10.5)
Potassium: 4.5 mEq/L (ref 3.5–5.1)
Sodium: 129 mEq/L — ABNORMAL LOW (ref 135–145)

## 2013-04-27 LAB — SODIUM: Sodium: 130 mEq/L — ABNORMAL LOW (ref 135–145)

## 2013-04-27 MED ORDER — TOLVAPTAN 15 MG PO TABS
15.0000 mg | ORAL_TABLET | ORAL | Status: DC
  Administered 2013-04-27 – 2013-04-28 (×2): 15 mg via ORAL
  Filled 2013-04-27 (×3): qty 1

## 2013-04-27 NOTE — Progress Notes (Signed)
CSW continuing to follow for residential hospice placement.  Per MD, pt not yet medically stable for discharge today.  Per [employer] [facility], there were no available beds at [facility] today.  CSW updated [facility] as well.  CSW to continue to follow.   [name], MSW, LCSWA  Clinical Social Work [phone]

## 2013-04-27 NOTE — Progress Notes (Signed)
Pt. Was transfused one unit of blood.  Pt. Tolerated procedure well.  Will continue to monitor.

## 2013-04-27 NOTE — Progress Notes (Signed)
Pt remains inpatient, is in his hospital bed. Pt denies pain, states he is receiving pain medication regularly to control his pain. Pt denies loss of appetite. Pt denies other issues today. Indwelling foley catheter draining clear yellow urine.

## 2013-04-27 NOTE — CV Procedure (Signed)
[facility] Liaison:  Received request from CSW Suzanna 04/26/13 for family interest in [facility]. Chart reviewed and met with patient and spouse 04/26/13. Answered questions and provided explanation of services. Family experiencing multiple losses, made them aware of [facility] support services. Family aware no [facility] availability expected today (04/27/13). They have my contact information for questions or concerns. Will update family and CSW with availability changes. Thank you. Forrestine Him LCSW [phone]

## 2013-04-27 NOTE — Progress Notes (Signed)
CRITICAL VALUE ALERT  Critical value received:  Hgb 6.9, Plat 12  Date of notification:  04/27/13  Time of notification:  0601  Critical value read back: yes  Nurse who received alert:  Chrystie Nose RN  MD notified (1st page):  Craige Cotta  Time of first page: 0612  MD notified (2nd page): Craige Cotta  Time of second page: [phone]  Responding MD:  Craige Cotta  Time MD responded:  [phone], no new orders

## 2013-04-27 NOTE — Progress Notes (Signed)
  Radiation Oncology         [phone] ________________________________  Name: Andrew Nelson MRN: [id number]  Date: 04/27/2013  DOB: January 20, 1951  Weekly Radiation Therapy Management  Current Dose: 24 Gy     Planned Dose:  30 Gy  Narrative . . . . . . . . The patient presents for routine under treatment assessment.                                                      The patient is without new complaint.  His right arm symptoms are better.                                 Set-up films were reviewed.                                 The chart was checked. Physical Findings. . .  oral temperature is 98.8 F (37.1 C). His blood pressure is 116/68 and his pulse is 106. His respiration is 20. Marland Kitchen  No significant changes. Impression . . . . . . . The patient is  tolerating radiation. Plan . . . . . . . . . . . . Continue treatment as planned.  ________________________________  Artist Pais. Kathrynn Running, M.D.

## 2013-04-27 NOTE — Progress Notes (Signed)
OT Cancellation Note  Patient Details Name: Andrew Nelson MRN: [id number] DOB: 02/19/1951   Cancelled Treatment:    Reason Eval/Treat Not Completed: Patient at procedure or test/ unavailable  Lennox Laity [phone] 04/27/2013, 2:13 PM

## 2013-04-28 ENCOUNTER — Encounter: Payer: Self-pay | Admitting: Radiation Oncology

## 2013-04-28 ENCOUNTER — Ambulatory Visit
Admit: 2013-04-28 | Discharge: 2013-04-28 | Disposition: A | Discharge status: Home / Self Care | Payer: BC Managed Care – PPO | Attending: Radiation Oncology | Admitting: Radiation Oncology

## 2013-04-28 LAB — TYPE AND SCREEN: Unit division: 0

## 2013-04-28 LAB — SODIUM: Sodium: 130 mEq/L — ABNORMAL LOW (ref 135–145)

## 2013-04-28 LAB — CBC
MCH: 29.3 pg (ref 26.0–34.0)
MCHC: 34.9 g/dL (ref 30.0–36.0)
MCV: 84 fL (ref 78.0–100.0)
Platelets: 10 10*3/uL — CL (ref 150–400)
RBC: 2.87 MIL/uL — ABNORMAL LOW (ref 4.22–5.81)
RDW: 15.6 % — ABNORMAL HIGH (ref 11.5–15.5)

## 2013-04-28 LAB — BASIC METABOLIC PANEL
BUN: 22 mg/dL (ref 6–23)
Calcium: 8.9 mg/dL (ref 8.4–10.5)
Chloride: 101 mEq/L (ref 96–112)
GFR calc non Af Amer: >90 mL/min (ref >90)
Potassium: 4.2 mEq/L (ref 3.5–5.1)

## 2013-04-28 LAB — GLUCOSE, CAPILLARY

## 2013-04-28 MED ORDER — HYDROMORPHONE HCL PF 1 MG/ML IJ SOLN: 1.0000 mg | INTRAMUSCULAR | Status: AC | PRN

## 2013-04-28 MED ORDER — INSULIN ASPART PROT & ASPART (70-30 MIX) 100 UNIT/ML ~~LOC~~ SUSP: 28.0000 [IU] | Freq: Every day | SUBCUTANEOUS | Status: AC

## 2013-04-28 MED ORDER — ADULT MULTIVITAMIN W/MINERALS CH: 1.0000 | ORAL_TABLET | Freq: Every day | ORAL | Status: AC

## 2013-04-28 MED ORDER — SODIUM CHLORIDE 0.9 % IJ SOLN: 3.0000 mL | Freq: Two times a day (BID) | INTRAMUSCULAR | Status: DC

## 2013-04-28 MED ORDER — GUAIFENESIN-DM 100-10 MG/5ML PO SYRP: 5.0000 mL | ORAL_SOLUTION | ORAL | Status: AC | PRN

## 2013-04-28 MED ORDER — GLUCERNA SHAKE PO LIQD: 237.0000 mL | Freq: Two times a day (BID) | ORAL | Status: AC

## 2013-04-28 MED ORDER — SODIUM CHLORIDE 0.9 % IJ SOLN: 3.0000 mL | INTRAMUSCULAR | Status: DC | PRN

## 2013-04-28 MED ORDER — HEPARIN SOD (PORK) LOCK FLUSH 100 UNIT/ML IV SOLN: 500.0000 [IU] | INTRAVENOUS | Status: DC | PRN

## 2013-04-28 MED ORDER — SODIUM CHLORIDE 0.9 % IV SOLN: 250.0000 mL | INTRAVENOUS | Status: DC | PRN

## 2013-04-28 MED ORDER — HEPARIN SOD (PORK) LOCK FLUSH 100 UNIT/ML IV SOLN: 500.0000 [IU] | INTRAVENOUS | Status: DC

## 2013-04-28 MED ORDER — MAGNESIUM OXIDE 400 (241.3 MG) MG PO TABS: 400.0000 mg | ORAL_TABLET | Freq: Every day | ORAL | Status: AC

## 2013-04-28 MED ORDER — CALCIUM CARBONATE 1250 (500 CA) MG PO TABS: 1.0000 | ORAL_TABLET | Freq: Two times a day (BID) | ORAL | Status: AC

## 2013-04-28 MED ORDER — BISACODYL 10 MG RE SUPP: 10.0000 mg | Freq: Every day | RECTAL | Status: AC | PRN

## 2013-04-28 NOTE — Discharge Summary (Addendum)
Physician Discharge Summary  Andrew Nelson GNF:[id number] DOB: 11-25-1950 DOA: 04/18/2013  PCP: [name], MD  Admit date: 04/18/2013 Discharge date: 04/28/2013  Recommendations for Outpatient Follow-up:  1. Placement to residential hospice, followup with PCP as needed   Discharge Diagnoses:  Principal Problem:   Chest pain and right upper extremity pain in a patient with metastatic prostate cancer Active Problems:   DM w/o Complication Type I   PROSTATE CANCER, HX OF   Hypertension   Metastasis of neoplasm to spinal canal   Anemia of chronic disease   Hyponatremia   Protein-calorie malnutrition, moderate   Pain in limb   Other pancytopenia   Elevated d-dimer   DVT (deep venous thrombosis)   Chest tightness   Chest congestion   Weakness generalized   Adult failure to thrive    Discharge Condition: Medically stable to transfer to residential hospice today  Diet recommendation: As tolerated  History of present illness:  62 y.o. male with past medical history significant for metastatic castration resistant prostate cancer, status post radiation therapy and chemotherapy with Jevtana, status post T6-T7 laminectomy and resection of epidural tumor, left L5 decompressive hemilaminectomy with resection of epidural tumor on 03/13/2013 who was admitted to [facility] ED on 03/29/2013 with progressively worsening lower extremity weakness. 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 He then had further radiation therapy and was ultimately discharged to CIR where he completed rehabilitation and was discharged from rehabilitation on 04/18/2013 secondary to worsening right-sided chest and upper extremity pain impairing his ability to participate in rehabilitation. RUE confirmed DVT. He was initially started on Lovenox but this had to be  discontinued secondary to significant anemia/thrombocytopenia. Hospital course is complicated due to ongoing hyponatremia and need for Tolvaptan. Sodium is now stable and 132, 134.  Assessment/Plan:   Principal Problem:  Chest pain and right upper extremity pain in a patient with metastatic prostate cancer / right upper extremity DVT  - Right upper extremity DVT based on the right upper extremity Doppler study however patient cannot be on blood thinners due to significant anemia and thrombocytopenia related to metastatic prostate cancer. No evidence of pulmonary embolism on CT angiogram.  - Of note, Cardiac markers done at the time of complaints of chest pain were negative.  - Pain controlled with current analgesia therefore continue fentanyl 100 mcg patch; continue Dilaudid 2 mg IV every 4 hours as needed for severe pain and oxycodone 30 mg every 4 hours as needed by mouth for moderate pain  - Continue same regimen in hospice except for oxycodone use Percocet Active Problems:  Hypocalcemia  - Resolved status post calcium gluconate 04/19/2013. Continue calcium carbonate supplementation.  Hypokalemia / hypomagnesemia  - Continue magnesium oxide 400 mg daily  Pancytopenia  - Secondary to bone marrow suppression secondary to metastatic prostate cancer  - Hemoglobin was 6.9 04/27/2013 so we gave 1 unit of PRBC transfusion.  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. Not currently a candidate for further chemotherapy.  - Pt undergoing radiation treatment to T1 - T5 area, last RT today 04/28/2013 - continue decadron  Hypertension  - Continue  lisinopril.  Anemia of chronic disease  - Pt is status post 1 unit of packed red blood cells 04/19/2013 and again on 04/24/2013  and 04/27/2013 Protein-calorie malnutrition, moderate  - Likely from cancer related cachexia. Seen by dietitian on 04/19/2013. Continue Glucerna shakes and multivitamins.   Code Status: DNR/DNI  Family Communication: wife currently at bedside.   Medical Consultants:  [name], Oncology.  [name], Radiation Oncology.  [name], Palliative care Other Consultants:  Dietitian Anti-infectives:  None.   [name], MD  [facility]  Pager [phone]  Signed:  [name], MD  [facility] 04/28/2013, 11:21 AM  Pager #: [phone]   Discharge Exam: Filed Vitals:   04/28/13 1037  BP: 128/76  Pulse: 103  Temp:   Resp:    Filed Vitals:   04/27/13 2000 04/27/13 2112 04/28/13 0550 04/28/13 1037  BP: 106/63 110/60 118/67 128/76  Pulse: 109  84 103  Temp: 98.6 F (37 C)  98.1 F (36.7 C)   TempSrc: Oral  Oral   Resp: 18  18   Height:      Weight:      SpO2: 100%  99%     General: Pt is alert, follows commands appropriately, not in acute distress Cardiovascular: Regular rate and rhythm, S1/S2 +, no murmurs, no rubs, no gallops Respiratory: Clear to auscultation bilaterally, no wheezing, no crackles, no rhonchi Abdominal: Soft, distended, bowel sounds +, no guarding Extremities: no edema, no cyanosis, pulses palpable bilaterally DP and PT Neuro: Grossly nonfocal  Discharge Instructions  Discharge Orders   Future Appointments Provider Department Dept Phone   05/01/2013 1:30 PM [facility] [phone]   2013-05-18 3:30 PM [name], MD [facility] MEDICAL [facility]COL[facility] [phone]   06/01/2013 2:45 PM [name], MD [facility] [place] [phone]   Future Orders Complete By Expires   Call MD for:  difficulty breathing, headache or visual disturbances  As directed    Call MD for:  persistant dizziness or light-headedness  As directed    Call  MD for:  persistant nausea and vomiting  As directed    Call MD for:  severe uncontrolled pain  As directed    Diet - low sodium heart healthy  As directed    Discharge instructions  As directed    Comments:     1. please continue the following pain regimen: Dilaudid 1-2 mg every 2 hours IV as needed for severe pain. You may also use Percocet by mouth as needed for moderate pain. Continue fentanyl patch 100 mcg and replace every 3 days.   Increase activity slowly  As directed        Medication List    STOP taking these medications       heparin 5000 UNIT/ML injection     leuprolide 30 MG injection  Commonly known as:  LUPRON     metFORMIN 850 MG tablet  Commonly known as:  GLUCOPHAGE      TAKE these medications       ACCU-CHEK AVIVA PLUS test strip  Generic drug:  glucose blood  USE AS DIRECTED     bisacodyl 10 MG suppository  Commonly known as:  DULCOLAX  Place 1 suppository (10 mg total) rectally daily as needed.     calcium carbonate 1250 MG tablet  Commonly known as:  OS-CAL - dosed in mg of elemental calcium  Take 1 tablet (500 mg  of elemental calcium total) by mouth 2 (two) times daily with a meal.     dexamethasone 4 MG tablet  Commonly known as:  DECADRON  Take 1 tablet (4 mg total) by mouth every 6 (six) hours.     diazepam 5 MG tablet  Commonly known as:  VALIUM  Take 5-10 mg by mouth every 6 (six) hours as needed for anxiety.     doxazosin 8 MG tablet  Commonly known as:  CARDURA  Take 8 mg by mouth at bedtime.     feeding supplement Liqd  Take 237 mLs by mouth 2 (two) times daily between meals.     fentaNYL 100 MCG/HR  Commonly known as:  DURAGESIC - dosed mcg/hr  Place 1 patch onto the skin every 3 (three) days.     guaiFENesin-dextromethorphan 100-10 MG/5ML syrup  Commonly known as:  ROBITUSSIN DM  Take 5 mLs by mouth every 4 (four) hours as needed for cough.     hydrochlorothiazide 25 MG tablet  Commonly known as:  HYDRODIURIL  Take 25 mg  by mouth daily.     HYDROmorphone 1 MG/ML Soln injection  Commonly known as:  DILAUDID  Inject 1-2 mLs (1-2 mg total) into the vein every 2 (two) hours as needed for severe pain.     insulin aspart 100 UNIT/ML injection  Commonly known as:  novoLOG  Inject 0-20 Units into the skin 3 (three) times daily with meals.     insulin aspart protamine- aspart (70-30) 100 UNIT/ML injection  Commonly known as:  NOVOLOG MIX 70/30  Inject 0.28 mLs (28 Units total) into the skin daily with supper.     lidocaine-prilocaine cream  Commonly known as:  EMLA  Apply 1 application topically daily as needed (APPLY TO PORTA-CATH SITE 30 MINS TO 2 HOURS PRIOR TO TREATMENT & COVER WITH PLASTIC WRAP).     lisinopril 10 MG tablet  Commonly known as:  PRINIVIL,ZESTRIL  Take 10 mg by mouth daily.     magnesium oxide 400 (241.3 MG) MG tablet  Commonly known as:  MAG-OX  Take 1 tablet (400 mg total) by mouth daily.     multivitamin with minerals Tabs tablet  Take 1 tablet by mouth daily.     oxyCODONE-acetaminophen 10-325 MG per tablet  Commonly known as:  PERCOCET  Take 1-2 tablets by mouth every 4 (four) hours as needed for pain.     pantoprazole 40 MG tablet  Commonly known as:  PROTONIX  Take 1 tablet (40 mg total) by mouth at bedtime.     polyethylene glycol packet  Commonly known as:  MIRALAX / GLYCOLAX  Take 17 g by mouth daily as needed (for constipation).     senna 8.6 MG Tabs tablet  Commonly known as:  SENOKOT  Take 1 tablet (8.6 mg total) by mouth 2 (two) times daily.           Follow-up Information   Follow up with TODD,JEFFREY Freida Busman, MD. (As needed)    Specialty:  Family Medicine   Contact information:   [address] [place] [address] [phone]        The results of significant diagnostics from this hospitalization (including imaging, microbiology, ancillary and laboratory) are listed below for reference.    Significant Diagnostic Studies: Ct Angio Chest  Pe W/cm &/or Wo Cm  04/19/2013   *RADIOLOGY REPORT*  Clinical Data: Chest pain; metastatic prostate carcinoma  CT ANGIOGRAPHY CHEST  Technique:  Multidetector CT imaging of  the chest using the standard protocol during bolus administration of intravenous contrast. Multiplanar reconstructed images including MIPs were obtained and reviewed to evaluate the vascular anatomy.  Contrast: OMNIPAQUE IOHEXOL 350 MG/ML SOLN  Comparison:  Chest radiograph and chest CT March 13, 2013; MRI thoracic spine March 30, 2013  Findings: There is no demonstrable pulmonary embolus.  There is no thoracic aortic aneurysm or dissection.  There is bibasilar lung consolidation with bilateral pleural effusions.  There are areas of metastases arising from the pleura. One of these lesions is seen in the left upper hemithorax measuring 3.6 x 1.5 cm, marginally larger than on recent study.  A second lesion in this nature is new and arises in the lower anterior right hemithorax measuring 1.7 x 0.7 cm.  There are multiple metastatic bone lesions throughout the thoracic spine and multiple ribs and both scapulae. The degree of metastatic disease is extensive in the bony structures but not felt to be appreciably changed.  Compression of the thoracic cord at T5 was noted on recent MRI; in the interval, the patient has had a posterior decompressive procedure extending from T5-T8.  There is no abnormal fluid in this area.  No parenchymal lung nodular lesions are seen at this time.  There is a Port-A-Cath with tip in the superior vena cava.  There is no appreciable thoracic adenopathy.  There is a pericardial effusion.  Visualized upper abdominal structures show evidence of multiple small liver metastases.  IMPRESSION: No demonstrable pulmonary embolus.  Extensive bony metastases as well as liver metastases.  Pleural metastases are also noted in each lower hemithorax, progressed from recent prior study.  There are small pleural effusions bilaterally  with bibasilar consolidation.  The patient is undergone posterior decompressive laminectomies from T5-T8.   Original Report Authenticated By: Bretta Bang, M.D.   Mr Thoracic Spine Wo Contrast  03/30/2013   CLINICAL DATA:  62 year old male with prostate cancer known to be widely metastatic to bone. History of thoracic cord compression status post decompression earlier this month. Acute paraplegia x1 day. Sudden onset severe lower extremity weakness.  EXAM: MRI THORACIC AND LUMBAR SPINE WITHOUT CONTRAST  TECHNIQUE: Multiplanar and multiecho pulse sequences of the thoracic and lumbar spine were obtained without intravenous contrast.  COMPARISON:  Preoperative thoracic and lumbar MRI 03/13/2013.  FINDINGS: MR THORACIC SPINE FINDINGS  Limited sagittal imaging of the cervical spine again suggest diffuse cervical vertebral tumor involvement.  Interval diffuse thoracic spine bony metastatic disease, less pronounced at the T7, T8, and T9 vertebral bodies as before. Interval thoracic spine decompression centered at the T6-T7 level. Small volume of posterior postoperative paraspinal fluid. No mass effect on the thecal sac at this level.  Confluent right greater than left epidural tumor with cord compression at the T5 level, appears progressed since 03/13/2013. Epidural tumor continues to the upper aspect of the decompression level. Moderate to severe foraminal involvement by tumor at the bilateral T5 and right T4 neural foramen.  Otherwise epidural/ extraosseous tumor in the thoracic spine is most pronounced at the right T1 neural foramen/costovertebral junction, the posterior T1 epidural space (series 8, image 3), right T2 neural foramen, and left T11 pedicle/neural foramen. No additional cord compression.  No definite abnormal spinal cord signal/edema identified. Conus medullaris is described in the lumbar section below.  Additional rib associated and paraspinal tumor in the thorax (on the left at T11-T12, on the  right at T8, see image 8).  Lumbar findings are described below. 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 There is a 2 cm area of increased signal in the right hepatic lobe which might represent liver metastatic disease (series 8, image 44).  MR LUMBAR SPINE FINDINGS  Diffuse lumbar spine metastatic disease.  Progression of epidural tumor arising from the right L1 pedicle and posterior elements, with mild spinal stenosis now at the level of the conus (series 15, image 7). Mild involvement of the right T1 neural foramen not significantly changed. No signal abnormality in the conus.  Dorsal epidural tumor at the L3 level along with increased epidural lipomatosis here now results in mild L3 spinal stenosis.  Bulky ventral epidural tumor re- identified at the L5 level with increased spinal stenosis at this level, now severe. Interval left hemilaminectomy with a small volume of postoperative fluid in the laminectomy space. Severe epidural tumor involvement of the left L4 neural foramen again noted, in conjunction with ventral L4 epidural tumor which is not significantly changed. Severe involvement of the left L5 neural foramen is stable. Severe involvement of the right L5 neural foramen appears progressed.  Sacral spinal canal remains patent. Stable visualized abdominal viscera. Postoperative changes in the left lower paraspinal soft tissues.  IMPRESSION: MR THORACIC SPINE IMPRESSION  1. Progression of thoracic epidural tumor centered at the T5 vertebral level with cord compression. No definite cord signal abnormality.  2. Interval thoracic spine decompression centered at T6-T7. No adverse features.  3. Thoracic paraspinal and foraminal tumor elsewhere but no additional thoracic cord compression.  MR LUMBAR SPINE IMPRESSION  1. Progressed epidural tumor at the L1 level emanating from the right pedicle/ posterior elements with new spinal stenosis corresponding to the level of the conus  medullaris. No conus signal abnormality.  2. Increased spinal stenosis at L3, mild and related to both dorsal epidural tumor and epidural lipomatosis. 3. Mild progression of epidural and foraminal tumor centered at the L5 level. Spinal stenosis at L5 appears increased and severe despite interval left laminectomy changes at this level.  I spoke to Dr. Elisabeth Pigeon on the [facility] hospitalist team at the time of this report. She advised that she had already discussed this exam with Dr. Jordan Likes of neurosurgery who is advising transfer of the patient to [facility] for an emergent surgical spine decompression.   Electronically Signed   By: Augusto Gamble   On: 03/30/2013 15:19   Mr Lumbar Spine Wo Contrast  03/30/2013   CLINICAL DATA:  62 year old male with prostate cancer known to be widely metastatic to bone. History of thoracic cord compression status post decompression earlier this month. Acute paraplegia x1 day. Sudden onset severe lower extremity weakness.  EXAM: MRI THORACIC AND LUMBAR SPINE WITHOUT CONTRAST  TECHNIQUE: Multiplanar and multiecho pulse sequences of the thoracic and lumbar spine were obtained without intravenous contrast.  COMPARISON:  Preoperative thoracic and lumbar MRI 03/13/2013.  FINDINGS: MR THORACIC SPINE FINDINGS  Limited sagittal imaging of the cervical spine again suggest diffuse cervical vertebral tumor involvement.  Interval diffuse thoracic spine bony metastatic disease, less pronounced at the T7, T8, and T9 vertebral bodies as before. Interval thoracic spine decompression centered at the T6-T7 level. Small volume of posterior postoperative paraspinal fluid. No mass effect on the thecal sac at this level.  Confluent right greater than left epidural tumor with cord compression at the T5 level, appears progressed since 03/13/2013. Epidural tumor continues to the upper aspect of the decompression level. Moderate to severe foraminal involvement by tumor at the bilateral T5 and right  T4  neural foramen.  Otherwise epidural/ extraosseous tumor in the thoracic spine is most pronounced at the right T1 neural foramen/costovertebral junction, the posterior T1 epidural space (series 8, image 3), right T2 neural foramen, and left T11 pedicle/neural foramen. No additional cord compression.  No definite abnormal spinal cord signal/edema identified. Conus medullaris is described in the lumbar section below.  Additional rib associated and paraspinal tumor in the thorax (on the left at T11-T12, on the right at T8, see image 8).  Lumbar findings are described below. 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 There is a 2 cm area of increased signal in the right hepatic lobe which might represent liver metastatic disease (series 8, image 44).  MR LUMBAR SPINE FINDINGS  Diffuse lumbar spine metastatic disease.  Progression of epidural tumor arising from the right L1 pedicle and posterior elements, with mild spinal stenosis now at the level of the conus (series 15, image 7). Mild involvement of the right T1 neural foramen not significantly changed. No signal abnormality in the conus.  Dorsal epidural tumor at the L3 level along with increased epidural lipomatosis here now results in mild L3 spinal stenosis.  Bulky ventral epidural tumor re- identified at the L5 level with increased spinal stenosis at this level, now severe. Interval left hemilaminectomy with a small volume of postoperative fluid in the laminectomy space. Severe epidural tumor involvement of the left L4 neural foramen again noted, in conjunction with ventral L4 epidural tumor which is not significantly changed. Severe involvement of the left L5 neural foramen is stable. Severe involvement of the right L5 neural foramen appears progressed.  Sacral spinal canal remains patent. Stable visualized abdominal viscera. Postoperative changes in the left lower paraspinal soft tissues.  IMPRESSION: MR THORACIC SPINE IMPRESSION  1.  Progression of thoracic epidural tumor centered at the T5 vertebral level with cord compression. No definite cord signal abnormality.  2. Interval thoracic spine decompression centered at T6-T7. No adverse features.  3. Thoracic paraspinal and foraminal tumor elsewhere but no additional thoracic cord compression.  MR LUMBAR SPINE IMPRESSION  1. Progressed epidural tumor at the L1 level emanating from the right pedicle/ posterior elements with new spinal stenosis corresponding to the level of the conus medullaris. No conus signal abnormality.  2. Increased spinal stenosis at L3, mild and related to both dorsal epidural tumor and epidural lipomatosis. 3. Mild progression of epidural and foraminal tumor centered at the L5 level. Spinal stenosis at L5 appears increased and severe despite interval left laminectomy changes at this level.  I spoke to Dr. Elisabeth Pigeon on the [facility] hospitalist team at the time of this report. She advised that she had already discussed this exam with Dr. Jordan Likes of neurosurgery who is advising transfer of the patient to [facility] for an emergent surgical spine decompression.   Electronically Signed   By: Augusto Gamble   On: 03/30/2013 15:19   Dg Chest Port 1 View  04/23/2013   CLINICAL DATA:  Chest tightness  EXAM: PORTABLE CHEST - 1 VIEW  COMPARISON:  CT chest 04/19/2013  FINDINGS: The heart size and mediastinal contours are within normal limits. Both lungs are clear. The visualized skeletal structures are unremarkable. Mild left basilar atelectasis. Scattered mild right basilar atelectasis. There is right IJ Port-A-Cath with tip in SVC right atrium junction. No diagnostic pneumothorax.  IMPRESSION: No active disease. Right IJ Port-A-Cath in place.   Electronically Signed   By: Natasha Mead  On: 04/23/2013 11:22    Microbiology: Recent Results (from the past 240 hour(s))  MRSA PCR SCREENING     Status: None   Collection Time    04/18/13 11:00 PM      Result Value Range Status    MRSA by PCR NEGATIVE  NEGATIVE Final   Comment:            The GeneXpert MRSA Assay (FDA     approved for NASAL specimens     only), is one component of a     comprehensive MRSA colonization     surveillance program. It is not     intended to diagnose MRSA     infection nor to guide or     monitor treatment for     MRSA infections.     Labs: Basic Metabolic Panel:  Recent Labs Lab 04/22/13 0605  04/24/13 0550 04/25/13 0605 04/26/13 0525 04/27/13 0540 04/27/13 1600 04/28/13 0015 04/28/13 0535 04/28/13 0825  NA 118*  < > 123* 125* 133* 129* 130* 130* 134* 132*  K 4.5  < > 4.7 5.0 4.6 4.5  --   --  4.2  --   CL 91*  < > 93* 95* 100 96  --   --  101  --   CO2 22  < > 24 24 26 26   --   --  27  --   GLUCOSE 138*  < > 100* 111* 146* 111*  --   --  114*  --   BUN 24*  < > 25* 27* 21 24*  --   --  22  --   CREATININE 0.31*  < > 0.31* 0.34* 0.32* 0.33*  --   --  0.30*  --   CALCIUM 8.1*  < > 8.1* 8.4 8.6 8.6  --   --  8.9  --   MG 2.0  --   --   --   --   --   --   --   --   --   < > = values in this interval not displayed. Liver Function Tests: No results found for this basename: AST, ALT, ALKPHOS, BILITOT, PROT, ALBUMIN,  in the last 168 hours No results found for this basename: LIPASE, AMYLASE,  in the last 168 hours No results found for this basename: AMMONIA,  in the last 168 hours CBC:  Recent Labs Lab 04/24/13 0550 04/25/13 0605 04/26/13 0525 04/27/13 0540 04/28/13 0535  WBC 1.6* 2.0* 1.0* 1.7* 1.3*  HGB 7.1* 8.0* 7.7* 6.9* 8.4*  HCT 20.4* 23.1* 22.1* 20.0* 24.1*  MCV 80.0 80.5 81.9 83.0 84.0  PLT 17* 16* 14* 12* 10*   Cardiac Enzymes: No results found for this basename: CKTOTAL, CKMB, CKMBINDEX, TROPONINI,  in the last 168 hours BNP: BNP (last 3 results) No results found for this basename: PROBNP,  in the last 8760 hours CBG:  Recent Labs Lab 04/27/13 0730 04/27/13 1156 04/27/13 1711 04/27/13 2115 04/28/13 0726  GLUCAP 99 166* 176* 121* 108*     Time coordinating discharge: Over 30 minutes

## 2013-04-28 NOTE — Progress Notes (Signed)
[facility] PROGRESS NOTE  Andrew Nelson Rumbold NWG:[id number] DOB: 1951/01/20 DOA: 04/18/2013 PCP: [name], MD  Brief narrative: 62 y.o. male with past medical history significant for metastatic castration resistant prostate cancer, status post radiation therapy and chemotherapy with Jevtana, status post T6-T7 laminectomy and resection of epidural tumor, left L5 decompressive hemilaminectomy with resection of epidural tumor on 03/13/2013 who was admitted to [facility] ED on 03/29/2013 with progressively worsening lower extremity weakness. 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 He then had further radiation therapy and was ultimately discharged to CIR where he completed rehabilitation and was discharged from rehabilitation on 04/18/2013 secondary to worsening right-sided chest and upper extremity pain impairing his ability to participate in rehabilitation. RUE confirmed DVT. He was initially started on Lovenox but this had to be discontinued secondary to significant anemia/thrombocytopenia. Hospital course is complicated due to ongoing hyponatremia and need for Tolvaptan.   Assessment/Plan:   Principal Problem:  Chest pain and right upper extremity pain in a patient with metastatic prostate cancer / right upper extremity DVT  - Right upper extremity DVT based on the right upper extremity Doppler study however patient cannot be on blood thinners due to significant anemia and thrombocytopenia related to metastatic prostate cancer. No evidence of pulmonary embolism on CT angiogram.  - Of note, Cardiac markers done at the time of complaints of chest pain were negative.  - Been controlled with current analgesia therefore continue fentanyl 100 mcg patch; continue Dilaudid 2 mg IV every 4 hours as needed for severe pain and oxycodone 30 mg  every 4 hours as needed by mouth for moderate pain  - plan to D/C to res hospice once hyponatremia improves   Active Problems:  Hypocalcemia  - Resolved status post calcium gluconate 04/19/2013. Continue calcium carbonate supplementation.  Hypokalemia / hypomagnesemia  - Monitor and replace electrolytes as needed. Magnesium and potassium within normal limits  - Continue magnesium oxide 400 mg daily  Pancytopenia  - Secondary to bone marrow suppression secondary to metastatic prostate cancer  - Hemoglobin was 6.9 this morning so we will give 1 unit of PRBC transfusion.  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 We d/c Tolvaptan yesterday but since sodium again low will restart it and most likely pt will need tolvaptan on discahrge  DM w/o Complication Type I  - Continue insulin 70/30 mix 20 units at bedtime as well as sliding scale insulin  Prostate cancer with spinal metastasis  - Being followed by [name]. Not currently a candidate for further chemotherapy.  - Pt undergoing radiation treatment to T1 - T5 area, last RT Monday 05/01/13  - continue decadron  Hypertension  - Continue lisinopril.  Anemia of chronic disease  - Monitor hemoglobin and transfuse for hemoglobin less than 7 mg/dL. This am Hgb 6.9 so will give 1 unit PRBC transfusion. Pt is status post 1 unit of packed red blood cells 04/19/2013 and again on 04/24/2013.  Protein-calorie malnutrition, moderate  - Likely from cancer related cachexia. Seen by dietitian on 04/19/2013. Continue Glucerna shakes and multivitamins.    Code Status: DNR/DNI  Family Communication: No family currently at bedside.  Disposition Plan: to residential hospice once bed available    Medical Consultants:  [name], Oncology.  [name], Radiation Oncology.  Dr. Efraim Kaufmann  Ladona Ridgel, Palliative care Other Consultants:  Dietitian Anti-infectives:  None.   [name], MD   [facility]  Pager [phone]   If 7PM-7AM, please contact night-coverage [email or web address] Password [email or web address] 04/28/2013, 11:19 AM   LOS: 10 days    HPI/Subjective: No overnight events.  Objective: Filed Vitals:   04/27/13 2000 04/27/13 2112 04/28/13 0550 04/28/13 1037  BP: 106/63 110/60 118/67 128/76  Pulse: 109  84 103  Temp: 98.6 F (37 C)  98.1 F (36.7 C)   TempSrc: Oral  Oral   Resp: 18  18   Height:      Weight:      SpO2: 100%  99%     Intake/Output Summary (Last 24 hours) at 04/28/13 1119 Last data filed at 04/28/13 0550  Gross per 24 hour  Intake    360 ml  Output   3050 ml  Net  -2690 ml    Exam:   General:  Pt is alert, follows commands appropriately, not in acute distress  Cardiovascular: Regular rate and rhythm, S1/S2, no murmurs, no rubs, no gallops  Respiratory: Clear to auscultation bilaterally, no wheezing, no crackles, no rhonchi  Abdomen: Soft, distended, bowel sounds present, no guarding  Extremities: No edema, pulses DP and PT palpable bilaterally  Neuro: Grossly nonfocal  Data Reviewed: Basic Metabolic Panel:  Recent Labs Lab 04/22/13 0605  04/24/13 0550 04/25/13 0605 04/26/13 0525 04/27/13 0540 04/27/13 1600 04/28/13 0015 04/28/13 0535 04/28/13 0825  NA 118*  < > 123* 125* 133* 129* 130* 130* 134* 132*  K 4.5  < > 4.7 5.0 4.6 4.5  --   --  4.2  --   CL 91*  < > 93* 95* 100 96  --   --  101  --   CO2 22  < > 24 24 26 26   --   --  27  --   GLUCOSE 138*  < > 100* 111* 146* 111*  --   --  114*  --   BUN 24*  < > 25* 27* 21 24*  --   --  22  --   CREATININE 0.31*  < > 0.31* 0.34* 0.32* 0.33*  --   --  0.30*  --   CALCIUM 8.1*  < > 8.1* 8.4 8.6 8.6  --   --  8.9  --   MG 2.0  --   --   --   --   --   --   --   --   --   < > = values in this interval not displayed. Liver Function Tests: No results found for this basename: AST, ALT, ALKPHOS, BILITOT, PROT, ALBUMIN,  in the last 168 hours No results found for this basename:  LIPASE, AMYLASE,  in the last 168 hours No results found for this basename: AMMONIA,  in the last 168 hours CBC:  Recent Labs Lab 04/24/13 0550 04/25/13 0605 04/26/13 0525 04/27/13 0540 04/28/13 0535  WBC 1.6* 2.0* 1.0* 1.7* 1.3*  HGB 7.1* 8.0* 7.7* 6.9* 8.4*  HCT 20.4* 23.1* 22.1* 20.0* 24.1*  MCV 80.0 80.5 81.9 83.0 84.0  PLT 17* 16* 14* 12* 10*   Cardiac Enzymes: No results found for this basename: CKTOTAL, CKMB, CKMBINDEX, TROPONINI,  in the last 168 hours BNP: No components found with this basename: POCBNP,  CBG:  Recent Labs Lab 04/27/13 0730 04/27/13 1156 04/27/13 1711 04/27/13 2115 04/28/13 0726  GLUCAP 99 166* 176* 121* 108*    Recent Results (from  the past 240 hour(s))  MRSA PCR SCREENING     Status: None   Collection Time    04/18/13 11:00 PM      Result Value Range Status   MRSA by PCR NEGATIVE  NEGATIVE Final   Comment:            The GeneXpert MRSA Assay (FDA     approved for NASAL specimens     only), is one component of a     comprehensive MRSA colonization     surveillance program. It is not     intended to diagnose MRSA     infection nor to guide or     monitor treatment for     MRSA infections.     Studies: No results found.  Scheduled Meds: . calcium carbonate  1 tablet Oral BID WC  . dexamethasone  4 mg Oral QID  . doxazosin  8 mg Oral QHS  . feeding supplement  237 mL Oral BID BM  . fentaNYL  100 mcg Transdermal Q72H  . guaiFENesin  600 mg Oral BID  . insulin aspart  0-20 Units Subcutaneous TID WC  . insulin aspart protamine- aspart  28 Units Subcutaneous Q supper  . lisinopril  10 mg Oral Daily  . magnesium oxide  400 mg Oral Daily  . multivitamin with minerals  1 tablet Oral Daily  . pantoprazole  40 mg Oral QHS  . polyethylene glycol  17 g Oral Daily  . senna  1 tablet Oral BID  . tolvaptan  15 mg Oral Q24H   Continuous Infusions: . sodium chloride 20 mL/hr at 04/26/13 [phone]

## 2013-04-28 NOTE — Progress Notes (Signed)
CRITICAL VALUE ALERT  Critical value received:  WBC 1.3, Platelets 10  Date of notification:  04/28/13  Time of notification:  0600  Critical value read back: yes  Nurse who received alert:  Chrystie Nose, RN  MD notified (1st page):  Craige Cotta  Time of first page:  0607  MD notified (2nd page):  Time of second page:  Responding MD:  Craige Cotta  Time MD responded: 0615, no new orders

## 2013-04-28 NOTE — Progress Notes (Unsigned)
I received a call from Dr. Elisabeth Pigeon who indicated that the patient has a bed available at [facility]. The patient wishes to pursue this and therefore she requested that today be. the final treatment. He had one additional treatment planned for Monday, but in reviewing his case and discussing his situation with her it appears reasonable for him to forego his last treatment and finish today. We will therefore remove him from Monday schedule,  and I will inform Dr. Kathrynn Running of this change request.

## 2013-04-28 NOTE — Progress Notes (Signed)
OT Cancellation Note  Patient Details Name: HIRAN LEARD MRN: [id number] DOB: 1951-04-20   Cancelled Treatment:    Reason Eval/Treat Not Completed: Other (comment) Spoke to Child psychotherapist and see note. Pt d/c to [facility] today. OT eval not indicated.  Lennox Laity [phone] 04/28/2013, 11:16 AM

## 2013-04-28 NOTE — Progress Notes (Signed)
CSW received notification from [facility], Forrestine Him that pt has bed available at [facility] for today.  CSW met with pt and pt wife at bedside to discuss.  Pt and pt wife agreeable to [facility] for today.  CSW notified MD.  CSW to facilitate pt discharge needs to [facility].  [name], MSW, LCSWA  Clinical Social Work [phone]

## 2013-04-28 NOTE — Progress Notes (Signed)
04/28/13 1145 Called [facility] and gave report to [place] the nurse that will be caring for Andrew Nelson. Copy of discharge papers given to patient.  EMS will transport patient to [facility]. Wife to meet patient at [facility].

## 2013-04-28 NOTE — Progress Notes (Signed)
Pt for discharge to [facility].  CSW facilitated pt discharge needs including confirming [facility] liaison, Forrestine Him faxed discharge information, discussing with pt at bedside, providing RN phone number to call report, and arranging ambulance transportation to [facility] (Service Request ID#: [id number]).   No further social work needs identified at this time.  CSW signing off.    [name], MSW, LCSWA  Clinical Social Work [phone]

## 2013-05-01 ENCOUNTER — Encounter: Payer: Self-pay | Admitting: Internal Medicine

## 2013-05-01 ENCOUNTER — Ambulatory Visit: Payer: BC Managed Care – PPO

## 2013-05-02 ENCOUNTER — Telehealth: Payer: Self-pay | Admitting: Dietician

## 2013-05-08 NOTE — Progress Notes (Signed)
  Radiation Oncology         [phone]) [phone] ________________________________  Name: Andrew Nelson MRN: [id number]  Date: 04/28/2013  DOB: July 24, 1951  End of Treatment Note  Diagnosis:   62 yo man with metastatic prostate cancer     Indication for treatment:  Palliation, preservation of spinal cord function       Radiation treatment dates:   04/03/2013-04/28/2013  Site/dose:    1.  radiation was delivered to the field arrangement encompassing vertebral bodies T12 through the sacrum including the SI joints to a total dose of 30 gray in 10 fractions with a field modification after 5 fractions in order to account for previous radiotherapy to minimize overlapping radiation exposure in the pelvis. 2.  Following recovery from decompressive surgery, the patient also received radiotherapy to a field arrangement encompassing vertebral bodies C7-T8 inclusive. The initial prescription was to deliver 30 gray in 10 fractions of 3 gray. However, this was modified to 27 gray in 9 fractions of 3 gray with the patient was discharged from the hospital before completion.  Beams/energy:    1.  T12 through the sacrum including the SI joints  were treated with 10 and 18 megavolt photons from anterior and posterior fields shaped to cover the involved bones and shield critical structures including the kidneys and uninvolved bowel.  2.  vertebral bodies C7-T8 inclusive were also treated using anterior and posterior radiation beams delivering 18 megavolt photons with shielding to minimize radiation exposure to the critical structures of the lungs and heart   Narrative: The patient tolerated radiation treatment relatively well.    He remained hospitalized and in a skilled nursing care facility through the entirety of his radiation course. The patient remained partially paretic in the lower extremities but remained able to stand with assistance and transfer.   Plan: The patient has completed radiation treatment. The  patient will return to radiation oncology clinic for routine followup in one month. I advised them to call or return sooner if they have any questions or concerns related to their recovery or treatment. ________________________________  Artist Pais. Kathrynn Running, M.D.

## 2013-05-09 ENCOUNTER — Encounter: Payer: Self-pay | Admitting: Oncology

## 2013-05-09 NOTE — Progress Notes (Signed)
Put fmla form on nurse's desk °

## 2013-05-10 ENCOUNTER — Ambulatory Visit: Payer: BC Managed Care – PPO | Admitting: Oncology

## 2013-05-11 ENCOUNTER — Encounter: Payer: Self-pay | Admitting: Oncology

## 2013-05-11 NOTE — Progress Notes (Signed)
Faxed fmla form to [employer] @ [phone]. Put fmla and life insurance form in registration desk.

## 2013-06-01 ENCOUNTER — Encounter: Payer: BC Managed Care – PPO | Admitting: Family Medicine

## 2013-06-10 NOTE — Progress Notes (Signed)
  Radiation Oncology         [phone] ________________________________  Name: Andrew Nelson MRN: [id number]  Date: 04/17/2013  DOB: June 25, 1951  Simulation Verification Note  Status: outpatient  NARRATIVE: The patient was brought to the treatment unit and placed in the planned treatment position. The clinical setup was verified. Then port films were obtained and uploaded to the radiation oncology medical record software.  The treatment beams were carefully compared against the planned radiation fields. The position location and shape of the radiation fields was reviewed. They targeted volume of tissue appears to be appropriately covered by the radiation beams. Organs at risk appear to be excluded as planned.  Based on my personal review, I approved the simulation verification. The patient's treatment will proceed as planned.  ------------------------------------------------  [name], M.D.

## 2013-06-10 DEATH — deceased

## 2013-06-15 ENCOUNTER — Other Ambulatory Visit: Payer: Self-pay

## 2013-11-27 ENCOUNTER — Encounter: Payer: Self-pay | Admitting: Internal Medicine

## 2013-11-29 ENCOUNTER — Telehealth: Payer: Self-pay

## 2013-11-29 NOTE — Telephone Encounter (Signed)
Patient past away per [facility]  °

## 2014-04-04 ENCOUNTER — Other Ambulatory Visit: Payer: Self-pay | Admitting: *Deleted
